# Patient Record
Sex: Female | Born: 1940 | Race: Black or African American | Hispanic: No | Smoking: Former smoker
Health system: Southern US, Community
[De-identification: ages and names within clinical notes are randomized; demographics above are authoritative.]

## PROBLEM LIST (undated history)

## (undated) DIAGNOSIS — G4730 Sleep apnea, unspecified: Secondary | ICD-10-CM

## (undated) DIAGNOSIS — I509 Heart failure, unspecified: Secondary | ICD-10-CM

## (undated) DIAGNOSIS — E119 Type 2 diabetes mellitus without complications: Secondary | ICD-10-CM

## (undated) DIAGNOSIS — N189 Chronic kidney disease, unspecified: Secondary | ICD-10-CM

## (undated) DIAGNOSIS — I10 Essential (primary) hypertension: Secondary | ICD-10-CM

## (undated) DIAGNOSIS — M1990 Unspecified osteoarthritis, unspecified site: Secondary | ICD-10-CM

## (undated) DIAGNOSIS — R0602 Shortness of breath: Secondary | ICD-10-CM

## (undated) DIAGNOSIS — M109 Gout, unspecified: Secondary | ICD-10-CM

## (undated) DIAGNOSIS — I639 Cerebral infarction, unspecified: Secondary | ICD-10-CM

## (undated) DIAGNOSIS — E785 Hyperlipidemia, unspecified: Secondary | ICD-10-CM

## (undated) DIAGNOSIS — J45909 Unspecified asthma, uncomplicated: Secondary | ICD-10-CM

## (undated) DIAGNOSIS — J449 Chronic obstructive pulmonary disease, unspecified: Secondary | ICD-10-CM

## (undated) HISTORY — DX: Essential (primary) hypertension: I10

## (undated) HISTORY — DX: Hyperlipidemia, unspecified: E78.5

## (undated) HISTORY — PX: EYE SURGERY: SHX253

## (undated) HISTORY — DX: Type 2 diabetes mellitus without complications: E11.9

## (undated) HISTORY — DX: Chronic obstructive pulmonary disease, unspecified: J44.9

## (undated) HISTORY — PX: TUBAL LIGATION: SHX77

## (undated) HISTORY — DX: Sleep apnea, unspecified: G47.30

---

## 2004-02-23 ENCOUNTER — Emergency Department (HOSPITAL_COMMUNITY): Admission: EM | Admit: 2004-02-23 | Discharge: 2004-02-23 | Payer: Self-pay | Admitting: Emergency Medicine

## 2007-06-27 ENCOUNTER — Encounter: Admission: RE | Admit: 2007-06-27 | Discharge: 2007-06-27 | Payer: Self-pay | Admitting: Internal Medicine

## 2009-02-04 ENCOUNTER — Encounter: Admission: RE | Admit: 2009-02-04 | Discharge: 2009-02-04 | Payer: Self-pay | Admitting: Internal Medicine

## 2009-07-11 ENCOUNTER — Encounter: Admission: RE | Admit: 2009-07-11 | Discharge: 2009-07-11 | Payer: Self-pay | Admitting: Internal Medicine

## 2009-08-09 ENCOUNTER — Ambulatory Visit: Payer: Self-pay

## 2009-08-09 ENCOUNTER — Encounter (INDEPENDENT_AMBULATORY_CARE_PROVIDER_SITE_OTHER): Payer: Self-pay | Admitting: Internal Medicine

## 2009-08-09 ENCOUNTER — Encounter: Payer: Self-pay | Admitting: Internal Medicine

## 2010-10-06 ENCOUNTER — Emergency Department (HOSPITAL_COMMUNITY): Admission: EM | Admit: 2010-10-06 | Discharge: 2010-10-07 | Payer: Self-pay | Admitting: Emergency Medicine

## 2011-01-27 LAB — BASIC METABOLIC PANEL
GFR calc Af Amer: 44 mL/min — ABNORMAL LOW (ref >60)
GFR calc non Af Amer: 36 mL/min — ABNORMAL LOW (ref >60)
Potassium: 4.5 mEq/L (ref 3.5–5.1)
Sodium: 140 mEq/L (ref 135–145)

## 2011-01-27 LAB — CBC
HCT: 38.8 % (ref 36.0–46.0)
Hemoglobin: 13 g/dL (ref 12.0–15.0)
WBC: 7.7 10*3/uL (ref 4.0–10.5)

## 2011-01-27 LAB — DIFFERENTIAL
Basophils Absolute: 0 10*3/uL (ref 0.0–0.1)
Lymphocytes Relative: 19 % (ref 12–46)
Monocytes Absolute: 0.9 10*3/uL (ref 0.1–1.0)
Monocytes Relative: 12 % (ref 3–12)
Neutro Abs: 5.1 10*3/uL (ref 1.7–7.7)

## 2011-01-27 LAB — POCT CARDIAC MARKERS
CKMB, poc: 2.8 ng/mL (ref 1.0–8.0)
Myoglobin, poc: 186 ng/mL (ref 12–200)

## 2012-05-30 ENCOUNTER — Encounter: Payer: Self-pay | Admitting: Pulmonary Disease

## 2012-05-31 ENCOUNTER — Ambulatory Visit (INDEPENDENT_AMBULATORY_CARE_PROVIDER_SITE_OTHER): Payer: Medicare Other | Admitting: Internal Medicine

## 2012-05-31 ENCOUNTER — Ambulatory Visit (INDEPENDENT_AMBULATORY_CARE_PROVIDER_SITE_OTHER)
Admission: RE | Admit: 2012-05-31 | Discharge: 2012-05-31 | Disposition: A | Discharge status: Home / Self Care | Payer: Medicare Other | Source: Ambulatory Visit | Attending: Internal Medicine | Admitting: Internal Medicine

## 2012-05-31 ENCOUNTER — Encounter: Payer: Self-pay | Admitting: Internal Medicine

## 2012-05-31 VITALS — BP 162/82 | HR 108 | Temp 98.3°F | Ht 62.0 in | Wt 182.8 lb

## 2012-05-31 DIAGNOSIS — J449 Chronic obstructive pulmonary disease, unspecified: Secondary | ICD-10-CM | POA: Insufficient documentation

## 2012-05-31 NOTE — Patient Instructions (Addendum)
Work on inhaler technique:  relax and gently blow all the way out then take a nice smooth deep breath back in, triggering the inhaler at same time you start breathing in.  Hold for up to 5 seconds if you can.  Rinse and gargle with water when done   If your mouth or throat starts to bother you,   I suggest you time the inhaler to your dental care and after using the inhaler(s) brush teeth and tongue with a baking soda containing toothpaste and when you rinse this out, gargle with it first to see if this helps your mouth and throat.     Only use your albuterol as a rescue medication (proaire is Plan B,  Nebulizer is C) to be used if you can't catch your breath by resting or doing a relaxed purse lip breathing pattern. The less you use it, the better it will work when you need it.   Add pepcid 20 mg one every night at bedtime  GERD (REFLUX)  is an extremely common cause of respiratory symptoms, many times with no significant heartburn at all.    It can be treated with medication, but also with lifestyle changes including avoidance of late meals, excessive alcohol, smoking cessation, and avoid fatty foods, chocolate, peppermint, colas, red wine, and acidic juices such as orange juice.  NO MINT OR MENTHOL PRODUCTS SO NO COUGH DROPS  USE SUGARLESS CANDY INSTEAD (jolley ranchers or Stover's)  NO OIL BASED VITAMINS - use powdered substitutes.    Please remember to go to the  x-ray department downstairs for your tests - we will call you with the results when they are available.  Please schedule a follow up office visit in 4 weeks, sooner if needed with PFTs

## 2012-05-31 NOTE — Progress Notes (Signed)
  Subjective:    Patient ID: Beth [name], female    DOB: July 15, 1941   MRN: [id number]  HPI  89 yobf quit smoking around 2007 when noted onset of doe then gradually worse since quit with no gain in wt and referred 05/31/2012 for copd evaluation by Dr Karlton Lemon   05/31/2012 1st pulmonary eval cc progressive doe x 5years ? Some worse in spring and ? Summer with hot humid weather with subjective wheeze while on spiriva x 2 years then advair 7/13 and prednisone has helped to point where need neb less "only  3 x day" and puffer 3-4 times as well. Assoc with voice change/ hoarsenss, ear pain.   No unusual cough, purulent sputum or sinus/hb symptoms on present rx.   Sleeping ok without nocturnal  or early am exacerbation  of respiratory  c/o's or need for noct saba. Also denies any obvious fluctuation of symptoms with weather or environmental changes or other aggravating or alleviating factors except as outlined above   Review of Systems  Constitutional: Positive for fatigue. Negative for fever, chills, diaphoresis, activity change, appetite change and unexpected weight change.  HENT: Positive for ear pain, congestion and voice change. Negative for hearing loss, nosebleeds, sore throat, rhinorrhea, sneezing, mouth sores, trouble swallowing, neck pain, neck stiffness, dental problem, postnasal drip, sinus pressure and ear discharge.   Eyes: Negative for photophobia, discharge, itching and visual disturbance.  Respiratory: Positive for shortness of breath and wheezing. Negative for cough, choking and chest tightness.   Cardiovascular: Positive for leg swelling. Negative for chest pain and palpitations.  Gastrointestinal: Negative for nausea, vomiting, abdominal pain, constipation and blood in stool.  Genitourinary: Negative for dysuria, urgency, frequency, hematuria, decreased urine volume and difficulty urinating.  Musculoskeletal: Positive for gait problem. Negative for myalgias, back pain, joint swelling  and arthralgias.  Skin: Negative for rash.  Neurological: Negative for dizziness, tremors, syncope, weakness, light-headedness, numbness and headaches.  Hematological: Does not bruise/bleed easily.  Psychiatric/Behavioral: Positive for disturbed wake/sleep cycle. Negative for confusion and agitation. The patient is nervous/anxious.        Objective:   Physical Exam   HEENT mild turbinate edema.  Oropharynx no thrush or excess pnd or cobblestoning.  No JVD or cervical adenopathy. Mild accessory muscle hypertrophy. Trachea midline, nl thryroid. Chest was hyperinflated by percussion with diminished breath sounds and moderate increased exp time without wheeze. Hoover sign positive at mid inspiration. Regular rate and rhythm without murmur gallop or rub or increase P2 or edema.  Abd: no hsm, nl excursion. Ext warm without cyanosis or clubbing.      CXR  05/31/2012 :  Comparison: 10/06/2010  Findings: The heart is again mildly enlarged in size. Mild interstitial changes are again noted. No acute infiltrate is seen. No acute bony abnormality is noted.  IMPRESSION: Chronic changes without acute abnormality.         Assessment & Plan:

## 2012-06-01 NOTE — Progress Notes (Signed)
Quick Note:  Spoke with pt and notified of results per Dr. Wert. Pt verbalized understanding and denied any questions.  ______ 

## 2012-06-04 NOTE — Assessment & Plan Note (Signed)
Symptoms are markedly disproportionate to objective findings and not clear this is all a lung problem but pt does appear to have difficult airway management issues.   In this case Adherence is the biggest issue and starts with  inability to use HFA effectively and also  understand that SABA treats the symptoms but doesn't get to the underlying problem (inflammation).  I used  the analogy of putting steroid cream on a rash to help explain the meaning of topical therapy and the need to get the drug to the target tissue.    The proper method of use, as well as anticipated side effects, of a metered-dose inhaler are discussed and demonstrated to the patient. Improved effectiveness after extensive coaching during this visit to a level of approximately  75%  ? Acid reflux ? Anxiety also in ddx  See instructions for specific recommendations which were reviewed directly with the patient who was given a copy with highlighter outlining the key components.

## 2012-06-30 ENCOUNTER — Ambulatory Visit (INDEPENDENT_AMBULATORY_CARE_PROVIDER_SITE_OTHER): Payer: Medicare Other | Admitting: Internal Medicine

## 2012-06-30 ENCOUNTER — Encounter: Payer: Self-pay | Admitting: Internal Medicine

## 2012-06-30 VITALS — BP 130/80 | HR 78 | Temp 98.1°F | Ht 63.0 in | Wt 177.0 lb

## 2012-06-30 DIAGNOSIS — J449 Chronic obstructive pulmonary disease, unspecified: Secondary | ICD-10-CM

## 2012-06-30 LAB — PULMONARY FUNCTION TEST

## 2012-06-30 NOTE — Progress Notes (Signed)
  Subjective:    Patient ID: Beth [name], female    DOB: 08-28-1941   MRN: [id number]  HPI  60 yobf quit smoking around 2007 when noted onset of doe then gradually worse since quit with no gain in wt and referred 05/31/2012 for copd evaluation by Dr Karlton Lemon   05/31/2012 1st pulmonary eval cc progressive doe x 5years ? Some worse in spring and ? Summer with hot humid weather with subjective wheeze while on spiriva x 2 years then advair 7/13 and prednisone has helped to point where need neb less "only  3 x day" and puffer 3-4 times as well. Assoc with voice change/ hoarsenss, ear pain.   No unusual cough, purulent sputum or sinus/hb symptoms on present rx. rec Work on inhaler technique:   Only use your albuterol as a rescue medication (proaire is Plan B,  Nebulizer is C) Add pepcid 20 mg one every night at bedtime GERD diet office visit in 4 weeks, sooner if needed with PFTs  06/30/2012 f/u ov/Beth [name] cc much better sob, much less saba use less than once a day. Not limited from any desired activities by sob.   No unusual cough, purulent sputum or sinus/hb symptoms on present rx. No decline since finished last rx with prednisone     Sleeping ok without nocturnal  or early am exacerbation  of respiratory  c/o's or need for noct saba. Also denies any obvious fluctuation of symptoms with weather or environmental changes or other aggravating or alleviating factors except as outlined above           Objective:   Physical Exam Wt Readings from Last 3 Encounters:  06/30/12 177 lb (80.287 kg)  05/31/12 182 lb 12.8 oz (82.918 kg)     HEENT mild turbinate edema.  Oropharynx no thrush or excess pnd or cobblestoning.  No JVD or cervical adenopathy. Mild accessory muscle hypertrophy. Trachea midline, nl thryroid. Chest was hyperinflated by percussion with diminished breath sounds and moderate increased exp time without wheeze. Hoover sign positive at mid inspiration. Regular rate and rhythm without murmur  gallop or rub or increase P2 or edema.  Abd: no hsm, nl excursion. Ext warm without cyanosis or clubbing.      CXR  05/31/2012 :  Comparison: 10/06/2010  Findings: The heart is again mildly enlarged in size. Mild interstitial changes are again noted. No acute infiltrate is seen. No acute bony abnormality is noted.  IMPRESSION: Chronic changes without acute abnormality.         Assessment & Plan:

## 2012-06-30 NOTE — Assessment & Plan Note (Signed)
-  hfa 75% 05/31/2012    - PFT's 06/30/2012 FEV1  0.93 (49%) ratio 49 and DLCO 47 corrects to 87  GOLD III but no limiting symptoms or recent exacerbations or need for saba vs baseline so reasonable to continue ICU/LABA and Spiriva for now.    Each maintenance medication was reviewed in detail including most importantly the difference between maintenance and as needed and under what circumstances the prns are to be used.  Please see instructions for details which were reviewed in writing and the patient given a copy.

## 2012-06-30 NOTE — Progress Notes (Signed)
PFT done today. 

## 2012-06-30 NOTE — Patient Instructions (Addendum)
No change in your medications  Please schedule a follow up visit in 3 months but call sooner if needed

## 2012-10-05 ENCOUNTER — Ambulatory Visit (INDEPENDENT_AMBULATORY_CARE_PROVIDER_SITE_OTHER): Payer: Medicare Other | Admitting: Internal Medicine

## 2012-10-05 ENCOUNTER — Encounter: Payer: Self-pay | Admitting: Internal Medicine

## 2012-10-05 VITALS — BP 112/64 | HR 88 | Temp 97.4°F | Ht 62.0 in | Wt 182.0 lb

## 2012-10-05 DIAGNOSIS — J449 Chronic obstructive pulmonary disease, unspecified: Secondary | ICD-10-CM

## 2012-10-05 DIAGNOSIS — J4489 Other specified chronic obstructive pulmonary disease: Secondary | ICD-10-CM

## 2012-10-05 NOTE — Patient Instructions (Addendum)
Try off spiriva to see if you notice any difference but keep your rescue inhaler handy and restart the spiriva if needed  Please schedule a follow up visit in 3 months but call sooner if needed

## 2012-10-05 NOTE — Progress Notes (Signed)
  Subjective:    Patient ID: Beth [name], female    DOB: 11-04-1941   MRN: [id number]  HPI  5 yobf quit smoking around 2007 when noted onset of doe then gradually worse since quit with no gain in wt and referred 05/31/2012 for copd evaluation by Dr Karlton Lemon   05/31/2012 1st pulmonary eval cc progressive doe x 5years ? Some worse in spring and ? Summer with hot humid weather with subjective wheeze while on spiriva x 2 years then advair 7/13 and prednisone has helped to point where need neb less "only  3 x day" and puffer 3-4 times as well. Assoc with voice change/ hoarsenss, ear pain.   No unusual cough, purulent sputum or sinus/hb symptoms on present rx. rec Work on inhaler technique:   Only use your albuterol as a rescue medication (proaire is Plan B,  Nebulizer is C) Add pepcid 20 mg one every night at bedtime GERD diet office visit in 4 weeks, sooner if needed with PFTs  06/30/2012 f/u ov/Wert cc much better sob, much less saba use less than once a day. rec No change rx  10/05/2012 f/u ov/Wert cc no limit to activities due to sob, some hoarseness, min daytime saba use   Not limited from any desired activities by sob althogh relatively sedentary   No unusual cough, purulent sputum or sinus/hb symptoms on present rx. No decline since finished last rx with prednisone     Sleeping ok without nocturnal  or early am exacerbation  of respiratory  c/o's or need for noct saba. Also denies any obvious fluctuation of symptoms with weather or environmental changes or other aggravating or alleviating factors except as outlined above    ROS  The following are not active complaints unless bolded sore throat, dysphagia, dental problems, itching, sneezing,  nasal congestion or excess/ purulent secretions, ear ache,   fever, chills, sweats, unintended wt loss, pleuritic or exertional cp, hemoptysis,  orthopnea pnd or leg swelling, presyncope, palpitations, heartburn, abdominal pain, anorexia, nausea,  vomiting, diarrhea  or change in bowel or urinary habits, change in stools or urine, dysuria,hematuria,  rash, arthralgias, visual complaints, headache, numbness weakness or ataxia or problems with walking or coordination,  change in mood/affect or memory.             Objective:   Physical Exam Wt 182 10/05/2012  Wt Readings from Last 3 Encounters:  06/30/12 177 lb (80.287 kg)  05/31/12 182 lb 12.8 oz (82.918 kg)     HEENT mild turbinate edema.  Oropharynx no thrush or excess pnd or cobblestoning.  No JVD or cervical adenopathy. Mild accessory muscle hypertrophy. Trachea midline, nl thryroid. Chest was hyperinflated by percussion with diminished breath sounds and moderate increased exp time without wheeze. Hoover sign positive at mid inspiration. Regular rate and rhythm without murmur gallop or rub or increase P2 or edema.  Abd: no hsm, nl excursion. Ext warm without cyanosis or clubbing.      CXR  05/31/2012 :  Comparison: 10/06/2010  Findings: The heart is again mildly enlarged in size. Mild interstitial changes are again noted. No acute infiltrate is seen. No acute bony abnormality is noted.  IMPRESSION: Chronic changes without acute abnormality.         Assessment & Plan:

## 2012-10-06 NOTE — Assessment & Plan Note (Addendum)
-  hfa 75% 05/31/2012    - PFT's 06/30/2012 FEV1  0.93 (49%) ratio 49 and DLCO 47 corrects to 87  I had an extended discussion with the patient today lasting 15 to 20 minutes of a 25 minute visit on the following issues:   She has GOLD III copd but very well compensated on present rx with most of the improvement coming after advair added and having trouble paying for all her meds so reasonable to try off spriva emphasizing it may take up to  10 days to tell the difference with doe and saba use as good indicators of effectiveness or lack thereof for spiriva.    Each maintenance medication was reviewed in detail including most importantly the difference between maintenance and as needed and under what circumstances the prns are to be used.  Please see instructions for details which were reviewed in writing and the patient given a copy.

## 2012-12-26 ENCOUNTER — Ambulatory Visit (INDEPENDENT_AMBULATORY_CARE_PROVIDER_SITE_OTHER): Payer: Medicare Other | Admitting: Internal Medicine

## 2012-12-26 ENCOUNTER — Encounter: Payer: Self-pay | Admitting: Internal Medicine

## 2012-12-26 VITALS — BP 130/72 | HR 89 | Temp 97.0°F | Ht 62.0 in | Wt 178.0 lb

## 2012-12-26 DIAGNOSIS — J449 Chronic obstructive pulmonary disease, unspecified: Secondary | ICD-10-CM

## 2012-12-26 MED ORDER — BUDESONIDE-FORMOTEROL FUMARATE 160-4.5 MCG/ACT IN AERO: INHALATION_SPRAY | RESPIRATORY_TRACT | Status: DC

## 2012-12-26 NOTE — Patient Instructions (Addendum)
Plan A= automatic / take no matter what = Symbicort 160 Take 2 puffs first thing in am and then another 2 puffs about 12 hours later should cause less hoarseness but if not ok to resume advair as plan A     Plan B = proaire(albuterol)  only if needed if can't catch breath to supplement plan A  Plan C = nebulizer (albuterol) only if Plan B doesn't work  Plan D > call the doctor  Plan E > ER   Please schedule a follow up visit in 3 months but call sooner if needed

## 2012-12-26 NOTE — Progress Notes (Signed)
  Subjective:    Patient ID: Beth [name], female    DOB: [birth year]   MRN: [id number]  HPI  79 yobf quit smoking around 2007 when noted onset of doe then gradually worse since quit with no gain in wt and referred 05/31/2012 for copd evaluation by Dr Karlton Lemon   05/31/2012 1st pulmonary eval cc progressive doe x 5years ? Some worse in spring and ? Summer with hot humid weather with subjective wheeze while on spiriva x 2 years then advair 7/13 and prednisone has helped to point where need neb less "only  3 x day" and puffer 3-4 times as well. Assoc with voice change/ hoarsenss, ear pain.   No unusual cough, purulent sputum or sinus/hb symptoms on present rx. rec Work on inhaler technique:   Only use your albuterol as a rescue medication (proaire is Plan B,  Nebulizer is C) Add pepcid 20 mg one every night at bedtime GERD diet office visit in 4 weeks, sooner if needed with PFTs  06/30/2012 f/u ov/Karsen Fellows cc much better sob, much less saba use less than once a day. rec No change rx  10/05/2012 f/u ov/Kailea Dannemiller cc no limit to activities due to sob, some hoarseness, min daytime saba use rec Try off spiriva to see if you notice any difference but keep your rescue inhaler handy and restart the spiriva if needed   12/26/2012 f/u ov/Noheli Melder cc no limiting sob doing ok off spiriva and on advair alone but variable hoarseness.  Min daytime saba     No unusual cough, purulent sputum or sinus/hb symptoms on present rx. No decline since finished last rx with prednisone     Sleeping ok without nocturnal  or early am exacerbation  of respiratory  c/o's or need for noct saba. Also denies any obvious fluctuation of symptoms with weather or environmental changes or other aggravating or alleviating factors except as outlined above    ROS  The following are not active complaints unless bolded sore throat, dysphagia, dental problems, itching, sneezing,  nasal congestion or excess/ purulent secretions, ear ache,   fever,  chills, sweats, unintended wt loss, pleuritic or exertional cp, hemoptysis,  orthopnea pnd or leg swelling, presyncope, palpitations, heartburn, abdominal pain, anorexia, nausea, vomiting, diarrhea  or change in bowel or urinary habits, change in stools or urine, dysuria,hematuria,  rash, arthralgias, visual complaints, headache, numbness weakness or ataxia or problems with walking or coordination,  change in mood/affect or memory.             Objective:   Physical Exam Wt 182 10/05/2012 > 12/26/2012 178   06/30/12 177 lb (80.287 kg)  05/31/12 182 lb 12.8 oz (82.918 kg)     HEENT mild turbinate edema.  Oropharynx no thrush or excess pnd or cobblestoning.  No JVD or cervical adenopathy. Mild accessory muscle hypertrophy. Trachea midline, nl thryroid. Chest was hyperinflated by percussion with diminished breath sounds and moderate increased exp time without wheeze. Hoover sign positive at mid inspiration. Regular rate and rhythm without murmur gallop or rub or increase P2 or edema.  Abd: no hsm, nl excursion. Ext warm without cyanosis or clubbing.      CXR  05/31/2012 :  Comparison: 10/06/2010  Findings: The heart is again mildly enlarged in size. Mild interstitial changes are again noted. No acute infiltrate is seen. No acute bony abnormality is noted.  IMPRESSION: Chronic changes without acute abnormality.         Assessment & Plan:

## 2012-12-27 NOTE — Assessment & Plan Note (Signed)
-  hfa 75% 12/26/12   - PFT's 06/30/2012 FEV1  0.93 (49%) ratio 49 and DLCO 47 corrects to 87  Variable hoarseness s sob/ cough may be related to advair effects on upper airway  The proper method of use, as well as anticipated side effects, of a metered-dose inhaler are discussed and demonstrated to the patient. Improved effectiveness after extensive coaching during this visit to a level of approximately  75% so worth try change to symbicort 160 2 bid with option  Also for budesonide/perforomist also.    Each maintenance medication was reviewed in detail including most importantly the difference between maintenance and as needed and under what circumstances the prns are to be used.  Please see instructions for details which were reviewed in writing and the patient given a copy.

## 2013-03-24 ENCOUNTER — Other Ambulatory Visit: Payer: Self-pay | Admitting: Ophthalmology

## 2013-03-24 NOTE — H&P (Signed)
Patient Record  S[facility]ENT, [place]  Patient Number:  [id number] Date of Birth:  [birth year] Age:  72 years old    Gender:  Female Date of Evaluation:  Mar 24, 2013  Chief Complaint:   72 year old female is referred for Cataract evaluation. Her left eye has had poor vision x 5 years. She cannot read well, has to sit close to TV to see it, cannot recognize faces unless they are close.  History of Present Illness:   72 yo female diabetic referred by Dr Karlton Lemon for diabetic eye exam.  She has had DM x 8 years. Her HgB A1C was over 7 on her last visit.  C/O blurred v ision.  No pain or discomfort.  No pus or mucus.  Presents for evaluationl( Reviewed by Doctor: GG) Past History:  Allergies:  nkda, Active Medications:   Other Medications:  Advair Diskus (fluticasone-salmeterol) disk with device 250-50 mcg/dose, simvastatin (simvastatin) tablet 20 mg, Tribenzor (olmesartan-amlodipin-hcthiazid) tablet 40-10-25 mg, Janumet (sitagliptin-metformin) tablet 50-1,000 mg, ProAir HFA (albuterol sulfate) HFA aerosol inhaler 90 mcg/actuation, albuterol sulfate (albuterol sulfate) solution for nebulization 0.63 mg/3 mL Birth History:  none Past Ocular History:  none Past Medical History:   diabetes  COPD  high blood pressure Past Surgical History:  none Family History:  no amblyopia, no blindness, no cataracts, no crossed eyes, no diabetic retinopathy, no glaucoma, no macular degeneration, no retinal detachment, no cancer, + diabetes (daughter), + heart disease (brother), + high blood pressure (mother, brother, father), + stroke (mother) Social History:   Smoking Status: former smoker  Alcohol:  none   Driving status:  not driving Marital status:  widowed Review of Systems:   Eyes: + decreased vision  All other systems are negative.  Examination:  Visual Acuity:   Distance [facility] [place]:  OD: 20/400    OS: 20/200 IOP:  OD:  14     OS:  14    @ 06:13PM (Goldmann applanation) Manifest Refraction:    Sphere     Cyl Axis       VA         Add       VA Prism Base R:  -4.00  -1.50  095    20/50                                  L:  -1.75                20/50                                    Confrontation visual field:  OU:  Normal  Motility:  OU:  Normal  Pupils:  OU:  Shape, size, direct and consensual reaction normal, No afferent defect  Adnexa:  Preauricular LN, lacrimal drainage, lacrimal glands, orbit normal  Eyelids:  Eyelids:  normal Conjunctiva: OU:  bulbar, palpebral normal  Cornea: OU:  epithelium, stroma, endothelium, tear film normal  Anterior Chamber: OU:  depth normal, no cell, no flare, 3+ deep / clear  Iris: OU:  normal, rubeosis absent Dilation:  OU: Tropicamide 1%, AK-Dilate @ 08:44AM Cycloplegic Refraction:    Sphere    Cyl Axis       VA   Prism Base R:  -4.00  -1.50   95    20/50  L:  -1.75                20/50     AScan Calulations: +11.00 Acrysof MA50BM PC IOL for emmetropia             Lens:  OD:  3+ nuclear sclerotic, 2+ cortical cataract OS:  3+ nuclear sclerotic, 2+ cortical cataract  Vitreous: OU:  normal  Optic Disc: OD:  cupping: 0.15   OS:  cupping: 0.15   Macula: OU:  normal  Vessels: OU:  normal  Periphery: OU:  normal   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 We discussed risks and benefits of surgery with illustrations.  Use of an implant lens was discussed with illustrations along with indicating that glasses will be needed post op for help with reading and making up any difference in distance power.  I have confirmed that her Cataracts are consistent with her reduced best corrected acuity of 20/50 OU. She desires to proceed with surgery on her right eye which is the worst eye  perceptually.  She indicated understanding our discussion and felt that her questions had been answered to her satisfaction.  She agrees with the treatment plan.  Patient Instructions: Do not eat or drink after midnight. Do not take any diabetic medication the morning of surgery. You may take your other medications with a small sip of water. Send letter to:  Willey Blade, MD, [facility], [address], [place], [place] [address] Return to clinic:  RV June 5th, 2014 for post-operative follow-up  Schedule:  Phacoemulsification, Posterior Chamber Intraocular Lens x 04/19/2013   (electronically signed)  Adonis Brook, MD

## 2013-03-24 NOTE — H&P (Signed)
Beth [name], [place]  Patient Number:  [id number] Date of Birth:  [birth year] Age:  72 years old    Gender:  Female Date of Evaluation:  Mar 24, 2013  Chief Complaint:   72 year old female is referred for Cataract evaluation. Her left eye has had poor vision x 5 years. She cannot read well, has to sit close to TV to see it, cannot recognize faces unless they are close.  History of Present Illness:   72 yo female diabetic referred by Dr Karlton Lemon for diabetic eye exam.  She has had DM x 8 years. Her HgB A1C was over 7 on her last visit.  C/O blurred v ision.  No pain or discomfort.  No pus or mucus.  Presents for evaluationl( Reviewed by Doctor: GG) Past History:  Allergies:  nkda, Active Medications:   Other Medications:  Advair Diskus (fluticasone-salmeterol) disk with device 250-50 mcg/dose, simvastatin (simvastatin) tablet 20 mg, Tribenzor (olmesartan-amlodipin-hcthiazid) tablet 40-10-25 mg, Janumet (sitagliptin-metformin) tablet 50-1,000 mg, ProAir HFA (albuterol sulfate) HFA aerosol inhaler 90 mcg/actuation, albuterol sulfate (albuterol sulfate) solution for nebulization 0.63 mg/3 mL Birth History:  none Past Ocular History:  none Past Medical History:   diabetes  COPD  high blood pressure Past Surgical History:  none Family History:  no amblyopia, no blindness, no cataracts, no crossed eyes, no diabetic retinopathy, no glaucoma, no macular degeneration, no retinal detachment, no cancer, + diabetes (daughter), + heart disease (brother), + high blood pressure (mother, brother, father), + stroke (mother) Social History:   Smoking Status: former smoker  Alcohol:  none   Driving status:  not driving Marital status:  widowed Review of Systems:   Eyes: + decreased vision  All other systems are negative.  Examination:  Visual Acuity:   Distance VA [place]:  OD: 20/400    OS: 20/200 IOP:  OD:  14     OS:  14    @ 06:13PM (Goldmann applanation) Manifest Refraction:    Sphere    Cyl Axis        VA         Add       VA Prism Base R:  -4.00  -1.50  095    20/50                                  L:  -1.75                20/50                                    Confrontation visual field:  OU:  Normal  Motility:  OU:  Normal  Pupils:  OU:  Shape, size, direct and consensual reaction normal, No afferent defect  Adnexa:  Preauricular LN, lacrimal drainage, lacrimal glands, orbit normal  Eyelids:  Eyelids:  normal Conjunctiva: OU:  bulbar, palpebral normal  Cornea: OU:  epithelium, stroma, endothelium, tear film normal  Anterior Chamber: OU:  depth normal, no cell, no flare, 3+ deep / clear  Iris: OU:  normal, rubeosis absent Dilation:  OU: Tropicamide 1%, AK-Dilate @ 08:44AM Cycloplegic Refraction:    Sphere    Cyl Axis       VA   Prism Base R:  -4.00  -1.50   95    20/50  L:  -1.75                20/50     AScan Calulations: +11.00 Acrysof MA50BM PC IOL for emmetropia             Lens:  OD:  3+ nuclear sclerotic, 2+ cortical cataract OS:  3+ nuclear sclerotic, 2+ cortical cataract  Vitreous: OU:  normal  Optic Disc: OD:  cupping: 0.15   OS:  cupping: 0.15   Macula: OU:  normal  Vessels: OU:  normal  Periphery: OU:  normal   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 We discussed risks and benefits of surgery with illustrations.  Use of an implant lens was discussed with illustrations along with indicating that glasses will be needed post op for help with reading and making up any difference in distance power.  I have confirmed that her Cataracts are consistent with her reduced best corrected acuity of 20/50 OU. She desires to proceed with surgery on her right eye which is the worst eye perceptually.  She  indicated understanding our discussion and felt that her questions had been answered to her satisfaction.  She agrees with the treatment plan. She desires to have Cataract surgery right eye.  Patient Instructions: Do not eat or drink after midnight. Do not take any diabetic medication the morning of surgery. You may take your other medications with a small sip of water. Send letter to:  Willey Blade, MD, [facility], [address], [address], [place], [place] [address] Return to clinic:  RV June 5th, 2014 for post-operative follow-up  Schedule:  Phacoemulsification, Posterior Chamber Intraocular Lens x 04/19/2013   (electronically signed)  Adonis Brook, MD

## 2013-03-28 ENCOUNTER — Ambulatory Visit (INDEPENDENT_AMBULATORY_CARE_PROVIDER_SITE_OTHER): Payer: Medicare Other | Admitting: Internal Medicine

## 2013-03-28 ENCOUNTER — Encounter: Payer: Self-pay | Admitting: Internal Medicine

## 2013-03-28 VITALS — BP 140/80 | HR 86 | Temp 97.5°F | Ht 62.0 in | Wt 178.0 lb

## 2013-03-28 DIAGNOSIS — J449 Chronic obstructive pulmonary disease, unspecified: Secondary | ICD-10-CM

## 2013-03-28 NOTE — Patient Instructions (Addendum)
Try symbicort 160 one twice daily   Only use your albuterol as a rescue medication to be used if you can't catch your breath by resting or doing a relaxed purse lip breathing pattern. The less you use it, the better it will work when you need it. Ok to use it up to every 4 hours but goal is less than twice a week  If you are limited from doing what you want and the albuterol doesn't help you do it and you can't tolerate twice daily symbicort at two puffs dose then add back spiriva daily but note it takes about 10 days to feel the spiriva working to improve your activity tolerance   If you are satisfied with your treatment plan let your doctor know and he/she can either refill your medications or you can return here when your prescription runs out.     If in any way you are not 100% satisfied,  please tell us.  If 100% better, tell your friends!

## 2013-03-28 NOTE — Progress Notes (Signed)
Subjective:    Patient ID: Beth [name], female    DOB: [birth year]   MRN: [id number]    Brief patient profile:  72 yobf quit smoking around 2007 when noted onset of doe then gradually worse since quit with no gain in wt and referred 05/31/2012 for copd evaluation by Dr Karlton Lemon with GOLD III COPD by PFT's 06/2012  HPI 05/31/2012 1st pulmonary eval cc progressive doe x 5years ? Some worse in spring and ? Summer with hot humid weather with subjective wheeze while on spiriva x 2 years then advair 7/13 and prednisone has helped to point where need neb less "only  3 x day" and puffer 3-4 times as well. Assoc with voice change/ hoarsenss, ear pain.   No unusual cough, purulent sputum or sinus/hb symptoms on present rx. rec Work on inhaler technique:   Only use your albuterol as a rescue medication (proaire is Plan B,  Nebulizer is C) Add pepcid 20 mg one every night at bedtime GERD diet office visit in 4 weeks, sooner if needed with PFTs    10/05/2012 f/u ov/Beth [name] cc no limit to activities due to sob, some hoarseness, min daytime saba use rec Try off spiriva to see if you notice any difference but keep your rescue inhaler handy and restart the spiriva if needed   12/26/2012 f/u ov/Beth [name] cc no limiting sob doing ok off spiriva and on advair alone but variable hoarseness.  Min daytime saba rec Plan A= automatic / take no matter what = Symbicort 160 Take 2 puffs first thing in am and then another 2 puffs about 12 hours later should cause less hoarseness but if not ok to resume advair as plan A  Plan B = proaire(albuterol)  only if needed if can't catch breath to supplement plan A Plan C = nebulizer (albuterol) only if Plan B doesn't work  03/28/2013 f/u ov/Beth [name]  Chief Complaint  Patient presents with  . Follow-up    Breathing is unchanged since the last visit. She feels like symbicort makes her jittery.     Much less need for proaire but still uses it around noon, hoarsness gone but now jittery  since chaned advair to symbicort.  Not using neb alb at all now.   No obvious daytime variabilty or assoc chronic cough or cp or chest tightness, subjective wheeze overt sinus or hb symptoms. No unusual exp hx or h/o childhood pna/ asthma or premature birth to her knowledge.    Sleeping ok without nocturnal  or early am exacerbation  of respiratory  c/o's or need for noct saba. Also denies any obvious fluctuation of symptoms with weather or environmental changes or other aggravating or alleviating factors except as outlined above    Current Medications, Allergies, Past Medical History, Past Surgical History, Family History, and Social History were reviewed in [employer] record.  ROS  The following are not active complaints unless bolded sore throat, dysphagia, dental problems, itching, sneezing,  nasal congestion or excess/ purulent secretions, ear ache,   fever, chills, sweats, unintended wt loss, pleuritic or exertional cp, hemoptysis,  orthopnea pnd or leg swelling, presyncope, palpitations, heartburn, abdominal pain, anorexia, nausea, vomiting, diarrhea  or change in bowel or urinary habits, change in stools or urine, dysuria,hematuria,  rash, arthralgias, visual complaints, headache, numbness weakness or ataxia or problems with walking or coordination,  change in mood/affect or memory.                 Objective:  Physical Exam  Wt 182 10/05/2012 > 12/26/2012 178 > 178 03/28/2013    06/30/12 177 lb (80.287 kg)  05/31/12 182 lb 12.8 oz (82.918 kg)     HEENT mild turbinate edema.  Oropharynx no thrush or excess pnd or cobblestoning.  No JVD or cervical adenopathy. Mild accessory muscle hypertrophy. Trachea midline, nl thryroid. Chest was hyperinflated by percussion with diminished breath sounds and moderate increased exp time without wheeze. Hoover sign positive at mid inspiration. Regular rate and rhythm without murmur gallop or rub or increase P2 or edema.  Abd:  no hsm, nl excursion. Ext warm without cyanosis or clubbing.      CXR  05/31/2012 :  Comparison: 10/06/2010  Findings: The heart is again mildly enlarged in size. Mild interstitial changes are again noted. No acute infiltrate is seen. No acute bony abnormality is noted.  IMPRESSION: Chronic changes without acute abnormality.         Assessment & Plan:

## 2013-03-28 NOTE — Assessment & Plan Note (Addendum)
  -   PFT's 06/30/2012 FEV1  0.93 (49%) ratio 49 and DLCO 47 corrects to 87   The proper method of use, as well as anticipated side effects, of a metered-dose inhaler are discussed and demonstrated to the patient. Improved effectiveness after extensive coaching during this visit to a level of approximately  90% from baseline of 50% so may well be she can still take symbicort 160 1 bid and get same benefit s the jitters if not add back spiriva.    Each maintenance medication was reviewed in detail including most importantly the difference between maintenance and as needed and under what circumstances the prns are to be used.  Please see instructions for details which were reviewed in writing and the patient given a copy.

## 2013-04-12 NOTE — Pre-Procedure Instructions (Signed)
Layloni Greif  04/12/2013   Your procedure is scheduled on:  Wednesday, June 4th   Report to [facility] [place] to main entrance "A" go to [employer] and go up to 3rd floor, check in at short stay desk.   Call this number if you have problems the morning of surgery: [phone]   Remember:   Do not eat food or drink liquids after midnight.   Take these medicines the morning of surgery with A SIP OF WATER: Diltiazem, Symbicort, Albuterol if needed   Do not wear jewelry, make-up or nail polish.  Do not wear lotions, powders, or perfumes, deodorant.  Do not shave 48 hours prior to surgery.   Do not bring valuables to the hospital.  Contacts, dentures or bridgework may not be worn into surgery.  Leave suitcase in the car. After surgery it may be brought to your room.  For patients admitted to the hospital, checkout time is 11:00 AM the day of  discharge.   Patients discharged the day of surgery will not be allowed to drive home.    Special Instructions: Shower using CHG 2 nights before surgery and the night before surgery.  If you shower the day of surgery use CHG.  Use special wash - you have one bottle of CHG for all showers.  You should use approximately 1/3 of the bottle for each shower.   Please read over the following fact sheets that you were given: Pain Booklet, Coughing and Deep Breathing and Surgical Site Infection Prevention

## 2013-04-13 ENCOUNTER — Encounter (HOSPITAL_COMMUNITY)
Admission: RE | Admit: 2013-04-13 | Discharge: 2013-04-13 | Disposition: A | Discharge status: Home / Self Care | Payer: Medicare Other | Source: Ambulatory Visit | Attending: Ophthalmology | Admitting: Ophthalmology

## 2013-04-13 ENCOUNTER — Encounter (HOSPITAL_COMMUNITY): Payer: Self-pay

## 2013-04-13 ENCOUNTER — Encounter (HOSPITAL_COMMUNITY): Payer: Self-pay | Admitting: Pharmacy Technician

## 2013-04-13 HISTORY — DX: Shortness of breath: R06.02

## 2013-04-13 HISTORY — DX: Unspecified asthma, uncomplicated: J45.909

## 2013-04-13 LAB — BASIC METABOLIC PANEL
BUN: 24 mg/dL — ABNORMAL HIGH (ref 6–23)
CO2: 27 mEq/L (ref 19–32)
Calcium: 9.9 mg/dL (ref 8.4–10.5)
Creatinine, Ser: 0.79 mg/dL (ref 0.50–1.10)

## 2013-04-13 LAB — CBC
MCH: 30.2 pg (ref 26.0–34.0)
MCV: 90.5 fL (ref 78.0–100.0)
Platelets: 266 10*3/uL (ref 150–400)
RBC: 4.74 MIL/uL (ref 3.87–5.11)

## 2013-04-13 NOTE — Progress Notes (Signed)
WILL REQUEST SLEEP STUDY FROM DR. SHELTON'S OFFICE.

## 2013-04-13 NOTE — Progress Notes (Signed)
NOTIFIED DR. Nida Boatman OFFICE OF CONSENT ORDER HAVING BOTH LEFT + RIGHT EYE, NEED CORRECTED AS TO WHICH EYE SURGERY TO BE .

## 2013-04-14 NOTE — Progress Notes (Addendum)
Friday....Marland KitchenMarland KitchenI spoke with Dr. Anderson Malta regarding the order and she confirmed the order (which is in epic) for the LEFT eye   Confirmed laterality of procedure based on H & P.Marland KitchenMarland KitchenDA Left a message for "Natasha" @ Dr. Roland Earl office for comparison EKG........(Friday 11:30AM)

## 2013-04-14 NOTE — Progress Notes (Signed)
Call to Dr. Dr. Roland Earl office, requested on voicemail to med. Records, to call back to [facility] or fax sleep study report ASAP.

## 2013-04-17 NOTE — Progress Notes (Signed)
Anesthesia chart review: Patient is a 72 year old female scheduled for phacoemulsification posterior chamber lens, left eye on 04/19/2013 by Dr. Anderson Malta. History includes obesity, diabetes mellitus type 2, former smoker, COPD Gold III, obstructive sleep apnea with use of CPAP, asthma, hypertension, hyperlipidemia.  PCP is Dr. Willey Blade.  Pulmonologist is Dr. Melvyn Novas, last visit 03/28/13 for routine follow-up.  CXR report on 05/31/12 showed the heart is again mildly enlarged in size. Mild interstitial changes are again noted. No acute infiltrate is seen. No acute bony abnormality is noted.  PFT's 06/30/2012 FEV1 0.93 (49%) ratio 49 and DLCO 47 corrects to 87.  EKG on 04/13/13 showed NSR, right BBB.  Echo on 08/09/09 showed: 1. Left ventricle: Systolic function was vigorous. The estimated ejection fraction was in the range of 65% to 70%. Doppler parameters are consistent with abnormal left ventricular relaxation (grade 1 diastolic dysfunction). 2. Aortic valve: AV is thickened, calcified without significantly restricted motion. 3. Trivial mitral regurgitation.  Preoperative labs noted.  If no acute changes then I would anticipate that she could proceed as planned.  She will be evaluated by her assigned anesthesiologist on the day of surgery.  [facility] Short Stay Center/Anesthesiology Phone [phone] 04/17/2013 12:07 PM

## 2013-04-18 MED ORDER — GATIFLOXACIN 0.5 % OP SOLN
1.0000 [drp] | OPHTHALMIC | Status: AC | PRN
  Administered 2013-04-19 (×3): 1 [drp] via OPHTHALMIC
  Filled 2013-04-18: qty 2.5

## 2013-04-18 MED ORDER — PREDNISOLONE ACETATE 1 % OP SUSP
1.0000 [drp] | OPHTHALMIC | Status: AC
  Administered 2013-04-19: 1 [drp] via OPHTHALMIC
  Filled 2013-04-18: qty 5

## 2013-04-18 MED ORDER — TETRACAINE HCL 0.5 % OP SOLN
2.0000 [drp] | OPHTHALMIC | Status: AC
  Administered 2013-04-19: 2 [drp] via OPHTHALMIC
  Filled 2013-04-18: qty 2

## 2013-04-18 MED ORDER — PHENYLEPHRINE HCL 2.5 % OP SOLN
1.0000 [drp] | OPHTHALMIC | Status: AC | PRN
  Administered 2013-04-19 (×3): 1 [drp] via OPHTHALMIC
  Filled 2013-04-18: qty 3

## 2013-04-19 ENCOUNTER — Encounter (HOSPITAL_COMMUNITY)
Admission: RE | Disposition: A | Discharge status: Home / Self Care | Payer: Self-pay | Source: Ambulatory Visit | Attending: Ophthalmology

## 2013-04-19 ENCOUNTER — Ambulatory Visit (HOSPITAL_COMMUNITY)
Admission: RE | Admit: 2013-04-19 | Discharge: 2013-04-19 | Disposition: A | Discharge status: Home / Self Care | Payer: Medicare Other | Source: Ambulatory Visit | Attending: Ophthalmology | Admitting: Ophthalmology

## 2013-04-19 ENCOUNTER — Encounter (HOSPITAL_COMMUNITY): Payer: Self-pay | Admitting: Vascular Surgery

## 2013-04-19 ENCOUNTER — Ambulatory Visit (HOSPITAL_COMMUNITY): Payer: Medicare Other | Admitting: Anesthesiology

## 2013-04-19 ENCOUNTER — Encounter (HOSPITAL_COMMUNITY): Payer: Self-pay | Admitting: *Deleted

## 2013-04-19 DIAGNOSIS — H25019 Cortical age-related cataract, unspecified eye: Secondary | ICD-10-CM | POA: Insufficient documentation

## 2013-04-19 DIAGNOSIS — J449 Chronic obstructive pulmonary disease, unspecified: Secondary | ICD-10-CM | POA: Insufficient documentation

## 2013-04-19 DIAGNOSIS — IMO0002 Reserved for concepts with insufficient information to code with codable children: Secondary | ICD-10-CM | POA: Insufficient documentation

## 2013-04-19 DIAGNOSIS — I10 Essential (primary) hypertension: Secondary | ICD-10-CM | POA: Insufficient documentation

## 2013-04-19 DIAGNOSIS — E1139 Type 2 diabetes mellitus with other diabetic ophthalmic complication: Secondary | ICD-10-CM | POA: Insufficient documentation

## 2013-04-19 DIAGNOSIS — Z79899 Other long term (current) drug therapy: Secondary | ICD-10-CM | POA: Insufficient documentation

## 2013-04-19 DIAGNOSIS — J4489 Other specified chronic obstructive pulmonary disease: Secondary | ICD-10-CM | POA: Insufficient documentation

## 2013-04-19 DIAGNOSIS — H2510 Age-related nuclear cataract, unspecified eye: Secondary | ICD-10-CM | POA: Insufficient documentation

## 2013-04-19 HISTORY — PX: CATARACT EXTRACTION W/PHACO: SHX586

## 2013-04-19 LAB — GLUCOSE, CAPILLARY
Glucose-Capillary: 86 mg/dL (ref 70–99)
Glucose-Capillary: 119 mg/dL — ABNORMAL HIGH (ref 70–99)

## 2013-04-19 SURGERY — PHACOEMULSIFICATION, CATARACT, WITH IOL INSERTION
Anesthesia: General | Site: Eye | Laterality: Right | Wound class: Clean

## 2013-04-19 MED ORDER — DEXAMETHASONE SODIUM PHOSPHATE 10 MG/ML IJ SOLN
INTRAMUSCULAR | Status: AC
  Filled 2013-04-19: qty 1

## 2013-04-19 MED ORDER — HYDROMORPHONE HCL PF 1 MG/ML IJ SOLN: 0.2500 mg | INTRAMUSCULAR | Status: DC | PRN

## 2013-04-19 MED ORDER — BACITRACIN-POLYMYXIN B 500-10000 UNIT/GM OP OINT
TOPICAL_OINTMENT | OPHTHALMIC | Status: AC
  Filled 2013-04-19: qty 3.5

## 2013-04-19 MED ORDER — BSS IO SOLN
INTRAOCULAR | Status: AC
  Filled 2013-04-19: qty 500

## 2013-04-19 MED ORDER — CEFAZOLIN SUBCONJUNCTIVAL INJECTION 100 MG/0.5 ML
200.0000 mg | INJECTION | SUBCONJUNCTIVAL | Status: DC
  Filled 2013-04-19: qty 1

## 2013-04-19 MED ORDER — TETRACAINE HCL 0.5 % OP SOLN
OPHTHALMIC | Status: AC
  Filled 2013-04-19: qty 2

## 2013-04-19 MED ORDER — SODIUM CHLORIDE 0.9 % IV SOLN
INTRAVENOUS | Status: DC
  Administered 2013-04-19 (×2): via INTRAVENOUS

## 2013-04-19 MED ORDER — GLYCOPYRROLATE 0.2 MG/ML IJ SOLN
INTRAMUSCULAR | Status: DC | PRN
  Administered 2013-04-19: .6 mg via INTRAVENOUS

## 2013-04-19 MED ORDER — ONDANSETRON HCL 4 MG/2ML IJ SOLN: 4.0000 mg | Freq: Once | INTRAMUSCULAR | Status: DC | PRN

## 2013-04-19 MED ORDER — ACETAZOLAMIDE SODIUM 500 MG IJ SOLR
INTRAMUSCULAR | Status: AC
  Filled 2013-04-19: qty 500

## 2013-04-19 MED ORDER — ACETAMINOPHEN 10 MG/ML IV SOLN: 1000.0000 mg | Freq: Once | INTRAVENOUS | Status: DC | PRN

## 2013-04-19 MED ORDER — TRIAMCINOLONE ACETONIDE 40 MG/ML IJ SUSP
INTRAMUSCULAR | Status: AC
  Filled 2013-04-19: qty 5

## 2013-04-19 MED ORDER — SODIUM CHLORIDE 0.9 % IV SOLN: INTRAVENOUS | Status: DC

## 2013-04-19 MED ORDER — NA CHONDROIT SULF-NA HYALURON 40-30 MG/ML IO SOLN
INTRAOCULAR | Status: DC | PRN
  Administered 2013-04-19: 0.5 mL via INTRAOCULAR

## 2013-04-19 MED ORDER — 0.9 % SODIUM CHLORIDE (POUR BTL) OPTIME
TOPICAL | Status: DC | PRN
  Administered 2013-04-19: 1000 mL

## 2013-04-19 MED ORDER — HYPROMELLOSE (GONIOSCOPIC) 2.5 % OP SOLN
OPHTHALMIC | Status: DC | PRN
  Administered 2013-04-19: 2 [drp] via OPHTHALMIC

## 2013-04-19 MED ORDER — EPINEPHRINE HCL 1 MG/ML IJ SOLN
INTRAMUSCULAR | Status: AC
  Filled 2013-04-19: qty 1

## 2013-04-19 MED ORDER — EPINEPHRINE HCL 1 MG/ML IJ SOLN
INTRAMUSCULAR | Status: DC | PRN
  Administered 2013-04-19: 12:00:00

## 2013-04-19 MED ORDER — NEOSTIGMINE METHYLSULFATE 1 MG/ML IJ SOLN
INTRAMUSCULAR | Status: DC | PRN
  Administered 2013-04-19: 4 mg via INTRAVENOUS

## 2013-04-19 MED ORDER — BUPIVACAINE HCL (PF) 0.75 % IJ SOLN
INTRAMUSCULAR | Status: AC
  Filled 2013-04-19: qty 10

## 2013-04-19 MED ORDER — HYPROMELLOSE (GONIOSCOPIC) 2.5 % OP SOLN
OPHTHALMIC | Status: AC
  Filled 2013-04-19: qty 15

## 2013-04-19 MED ORDER — LIDOCAINE HCL 2 % IJ SOLN
INTRAMUSCULAR | Status: AC
  Filled 2013-04-19: qty 20

## 2013-04-19 MED ORDER — BUPIVACAINE HCL (PF) 0.75 % IJ SOLN
INTRAMUSCULAR | Status: DC | PRN
  Administered 2013-04-19: 5 mL

## 2013-04-19 MED ORDER — FENTANYL CITRATE 0.05 MG/ML IJ SOLN
INTRAMUSCULAR | Status: DC | PRN
  Administered 2013-04-19: 50 ug via INTRAVENOUS

## 2013-04-19 MED ORDER — SUCCINYLCHOLINE CHLORIDE 20 MG/ML IJ SOLN
INTRAMUSCULAR | Status: DC | PRN
  Administered 2013-04-19: 100 mg via INTRAVENOUS

## 2013-04-19 MED ORDER — TRAMADOL HCL 50 MG PO TABS: 50.0000 mg | ORAL_TABLET | ORAL | Status: DC | PRN

## 2013-04-19 MED ORDER — PROVISC 10 MG/ML IO SOLN
INTRAOCULAR | Status: DC | PRN
  Administered 2013-04-19: 8.5 mg via INTRAOCULAR

## 2013-04-19 MED ORDER — BACITRACIN-POLYMYXIN B 500-10000 UNIT/GM OP OINT
TOPICAL_OINTMENT | OPHTHALMIC | Status: DC | PRN
  Administered 2013-04-19: 1 via OPHTHALMIC

## 2013-04-19 MED ORDER — ONDANSETRON HCL 4 MG/2ML IJ SOLN
INTRAMUSCULAR | Status: DC | PRN
  Administered 2013-04-19: 4 mg via INTRAVENOUS

## 2013-04-19 MED ORDER — ROCURONIUM BROMIDE 100 MG/10ML IV SOLN
INTRAVENOUS | Status: DC | PRN
  Administered 2013-04-19: 20 mg via INTRAVENOUS

## 2013-04-19 MED ORDER — LIDOCAINE HCL (CARDIAC) 20 MG/ML IV SOLN
INTRAVENOUS | Status: DC | PRN
  Administered 2013-04-19: 80 mg via INTRAVENOUS

## 2013-04-19 MED ORDER — PROPOFOL 10 MG/ML IV BOLUS
INTRAVENOUS | Status: DC | PRN
  Administered 2013-04-19: 50 mg via INTRAVENOUS
  Administered 2013-04-19: 100 mg via INTRAVENOUS

## 2013-04-19 SURGICAL SUPPLY — 60 items
APPLICATOR COTTON TIP 6IN STRL (MISCELLANEOUS) ×2 IMPLANT
APPLICATOR DR MATTHEWS STRL (MISCELLANEOUS) ×2 IMPLANT
BAG MINI COLL DRAIN (WOUND CARE) ×2 IMPLANT
BLADE EYE MINI 60D BEAVER (BLADE) IMPLANT
BLADE KERATOME 2.75 (BLADE) ×2 IMPLANT
BLADE STAB KNIFE 15DEG (BLADE) IMPLANT
CANNULA ANTERIOR CHAMBER 27GA (MISCELLANEOUS) IMPLANT
CLOTH BEACON ORANGE TIMEOUT ST (SAFETY) ×2 IMPLANT
DRAPE OPHTHALMIC 77X100 STRL (CUSTOM PROCEDURE TRAY) ×2 IMPLANT
DRAPE POUCH INSTRU U-SHP 10X18 (DRAPES) ×2 IMPLANT
DRSG TEGADERM 4X4.75 (GAUZE/BANDAGES/DRESSINGS) ×2 IMPLANT
FILTER BLUE MILLIPORE (MISCELLANEOUS) IMPLANT
GLOVE BIO SURGEON STRL SZ 6.5 (GLOVE) ×2 IMPLANT
GLOVE ECLIPSE STER SZ 5.5 (GLOVE) ×2 IMPLANT
GLOVE SS BIOGEL STRL SZ 6.5 (GLOVE) ×1 IMPLANT
GLOVE SUPERSENSE BIOGEL SZ 6.5 (GLOVE) ×1
GOWN SRG XL XLNG 56XLVL 4 (GOWN DISPOSABLE) ×1 IMPLANT
GOWN STRL NON-REIN LRG LVL3 (GOWN DISPOSABLE) ×2 IMPLANT
GOWN STRL NON-REIN XL XLG LVL4 (GOWN DISPOSABLE) ×1
KIT BASIN OR (CUSTOM PROCEDURE TRAY) ×2 IMPLANT
KIT ROOM TURNOVER OR (KITS) IMPLANT
KNIFE GRIESHABER SHARP 2.5MM (MISCELLANEOUS) ×2 IMPLANT
LENS IOL ACRYSOF MP POST 13.5 (Intraocular Lens) ×2 IMPLANT
MASK EYE SHIELD (GAUZE/BANDAGES/DRESSINGS) ×2 IMPLANT
NEEDLE 18GX1X1/2 (RX/OR ONLY) (NEEDLE) IMPLANT
NEEDLE 21 GA WING INFUSION (NEEDLE) IMPLANT
NEEDLE 22X1 1/2 (OR ONLY) (NEEDLE) ×2 IMPLANT
NEEDLE 25GX 5/8IN NON SAFETY (NEEDLE) ×2 IMPLANT
NEEDLE FILTER BLUNT 18X 1/2SAF (NEEDLE)
NEEDLE FILTER BLUNT 18X1 1/2 (NEEDLE) IMPLANT
NEEDLE HYPO 30X.5 LL (NEEDLE) ×4 IMPLANT
NS IRRIG 1000ML POUR BTL (IV SOLUTION) ×2 IMPLANT
PACK CATARACT CUSTOM (CUSTOM PROCEDURE TRAY) ×2 IMPLANT
PACK CATARACT MCHSCP (PACKS) ×2 IMPLANT
PACK COMBINED CATERACT/VIT 23G (OPHTHALMIC RELATED) IMPLANT
PAD ARMBOARD 7.5X6 YLW CONV (MISCELLANEOUS) ×4 IMPLANT
PAD EYE OVAL STERILE LF (GAUZE/BANDAGES/DRESSINGS) ×2 IMPLANT
PROBE ANTERIOR 20G W/INFUS NDL (MISCELLANEOUS) IMPLANT
RING MALYGIN (MISCELLANEOUS) IMPLANT
ROLLS DENTAL (MISCELLANEOUS) IMPLANT
SHUTTLE MONARCH TYPE A (NEEDLE) ×2 IMPLANT
SPEAR EYE SURG WECK-CEL (MISCELLANEOUS) IMPLANT
SUT ETHILON 10-0 CS-B-6CS-B-6 (SUTURE)
SUT ETHILON 5 0 P 3 18 (SUTURE)
SUT ETHILON 9 0 TG140 8 (SUTURE) IMPLANT
SUT NYLON ETHILON 5-0 P-3 1X18 (SUTURE) IMPLANT
SUT PLAIN 6 0 TG1408 (SUTURE) IMPLANT
SUT POLY NON ABSORB 10-0 8 STR (SUTURE) IMPLANT
SUT VICRYL 6 0 S 29 12 (SUTURE) IMPLANT
SUTURE EHLN 10-0 CS-B-6CS-B-6 (SUTURE) IMPLANT
SYR 20CC LL (SYRINGE) IMPLANT
SYR 5ML LL (SYRINGE) IMPLANT
SYR TB 1ML LUER SLIP (SYRINGE) IMPLANT
SYRINGE 10CC LL (SYRINGE) IMPLANT
TAPE PAPER MEDFIX 1IN X 10YD (GAUZE/BANDAGES/DRESSINGS) ×2 IMPLANT
TIP ABS 45DEG FLARED 0.9MM (TIP) ×2 IMPLANT
TOWEL OR 17X24 6PK STRL BLUE (TOWEL DISPOSABLE) ×4 IMPLANT
WATER STERILE IRR 1000ML POUR (IV SOLUTION) ×2 IMPLANT
WIPE INSTRUMENT ADHESIVE BACK (MISCELLANEOUS) ×2 IMPLANT
WIPE INSTRUMENT VISIWIPE 73X73 (MISCELLANEOUS) ×2 IMPLANT

## 2013-04-19 NOTE — Anesthesia Procedure Notes (Signed)
Procedure Name: Intubation Date/Time: 04/19/2013 12:35 PM Performed by: Eligha Bridegroom Pre-anesthesia Checklist: Emergency Drugs available, Patient identified and Suction available Patient Re-evaluated:Patient Re-evaluated prior to inductionOxygen Delivery Method: Circle system utilized Preoxygenation: Pre-oxygenation with 100% oxygen Intubation Type: IV induction Ventilation: Mask ventilation without difficulty and Oral airway inserted - appropriate to patient size Laryngoscope Size: Mac and 3 Grade View: Grade I Tube type: Oral Tube size: 7.5 mm Number of attempts: 1 Airway Equipment and Method: Stylet Placement Confirmation: ETT inserted through vocal cords under direct vision,  breath sounds checked- equal and bilateral and positive ETCO2 Secured at: 21 cm Tube secured with: Tape Dental Injury: Teeth and Oropharynx as per pre-operative assessment

## 2013-04-19 NOTE — Preoperative (Signed)
Beta Blockers   Reason not to administer Beta Blockers:Not Applicable

## 2013-04-19 NOTE — H&P (View-Only) (Signed)
Patient Record  Beth [name], [name]  Patient Number:  [id number] Date of Birth:  [birth year] Age:  72 years old    Gender:  Female Date of Evaluation:  Mar 24, 2013  Chief Complaint:   72 year old female is referred for Cataract evaluation. Her left eye has had poor vision x 5 years. She cannot read well, has to sit close to TV to see it, cannot recognize faces unless they are close.  History of Present Illness:   72 yo female diabetic referred by Dr Beth Beth [name] for diabetic eye exam.  She has had DM x 8 years. Her HgB A1C was over 7 on her last visit.  C/O blurred v ision.  No pain or discomfort.  No pus or mucus.  Presents for evaluationl( Reviewed by Doctor: GG) Past History:  Allergies:  nkda, Active Medications:   Other Medications:  Advair Diskus (fluticasone-salmeterol) disk with device 250-50 mcg/dose, simvastatin (simvastatin) tablet 20 mg, Tribenzor (olmesartan-amlodipin-hcthiazid) tablet 40-10-25 mg, Janumet (sitagliptin-metformin) tablet 50-1,000 mg, ProAir HFA (albuterol sulfate) HFA aerosol inhaler 90 mcg/actuation, albuterol sulfate (albuterol sulfate) solution for nebulization 0.63 mg/3 mL Birth History:  none Past Ocular History:  none Past Medical History:   diabetes  COPD  high blood pressure Past Surgical History:  none Family History:  no amblyopia, no blindness, no cataracts, no crossed eyes, no diabetic retinopathy, no glaucoma, no macular degeneration, no retinal detachment, no cancer, + diabetes (daughter), + heart disease (brother), + high blood pressure (mother, brother, father), + stroke (mother) Social History:   Smoking Status: former smoker  Alcohol:  none   Driving status:  not driving Marital status:  widowed Review of Systems:   Eyes: + decreased vision  All other systems are negative.  Examination:  Visual Acuity:   Distance VA [place]:  OD: 20/400    OS: 20/200 IOP:  OD:  14     OS:  14    @ 06:13PM (Goldmann applanation) Manifest Refraction:    Sphere     Cyl Axis       VA         Add       VA Prism Base R:  -4.00  -1.50  095    20/50                                  L:  -1.75                20/50                                    Confrontation visual field:  OU:  Normal  Motility:  OU:  Normal  Pupils:  OU:  Shape, size, direct and consensual reaction normal, No afferent defect  Adnexa:  Preauricular LN, lacrimal drainage, lacrimal glands, orbit normal  Eyelids:  Eyelids:  normal Conjunctiva: OU:  bulbar, palpebral normal  Cornea: OU:  epithelium, stroma, endothelium, tear film normal  Anterior Chamber: OU:  depth normal, no cell, no flare, 3+ deep / clear  Iris: OU:  normal, rubeosis absent Dilation:  OU: Tropicamide 1%, AK-Dilate @ 08:44AM Cycloplegic Refraction:    Sphere    Cyl Axis       VA   Prism Base R:  -4.00  -1.50   95    20/50  L:  -1.75                20/50     AScan Calulations: +11.00 Acrysof MA50BM PC IOL for emmetropia             Lens:  OD:  3+ nuclear sclerotic, 2+ cortical cataract OS:  3+ nuclear sclerotic, 2+ cortical cataract  Vitreous: OU:  normal  Optic Disc: OD:  cupping: 0.15   OS:  cupping: 0.15   Macula: OU:  normal  Vessels: OU:  normal  Periphery: OU:  normal   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 We discussed risks and benefits of surgery with illustrations.  Use of an implant lens was discussed with illustrations along with indicating that glasses will be needed post op for help with reading and making up any difference in distance power.  I have confirmed that her Cataracts are consistent with her reduced best corrected acuity of 20/50 OU. She desires to proceed with surgery on her right eye which is the worst eye  perceptually.  She indicated understanding our discussion and felt that her questions had been answered to her satisfaction.  She agrees with the treatment plan.  Patient Instructions: Do not eat or drink after midnight. Do not take any diabetic medication the morning of surgery. You may take your other medications with a small sip of water. Send letter to:  Willey Blade, MD, [facility], [address], [place], [place] [address] Return to clinic:  RV June 5th, 2014 for post-operative follow-up  Schedule:  Phacoemulsification, Posterior Chamber Intraocular Lens x 04/19/2013   (electronically signed)  Adonis Brook, MD

## 2013-04-19 NOTE — Progress Notes (Signed)
Spoke with Dr. Anderson Malta and she states the procedure is for the right eye and she will put in the correct consent order when she gets into PACU (she's in surgery at present time).  Consent will need to be signed in the holding area.

## 2013-04-19 NOTE — Progress Notes (Signed)
When I asked the patient which eye she was having surgery on, she states "I think the left, but I'm not sure".  I called and spoke with Dr. Anderson Malta again and she again states it is the right eye.  The eye drop orders are for the right eye.

## 2013-04-19 NOTE — Interval H&P Note (Signed)
History and Physical Interval Note:  04/19/2013 11:40 AM  Beth [name]  has presented today for surgery, with the diagnosis of Combined Cataract Right Eye  The various methods of treatment have been discussed with the patient and family. After consideration of risks, benefits and other options for treatment, the patient has consented to  Procedure(s): CATARACT EXTRACTION PHACO AND INTRAOCULAR LENS PLACEMENT ([place]) (Right) as a surgical intervention .  The patient's history has been reviewed, patient examined, no change in status, stable for surgery.  I have reviewed the patient's chart and labs.  Questions were answered to the patient's satisfaction.     Ashley Bultema, Lavella Hammock

## 2013-04-19 NOTE — Op Note (Signed)
Beth [name] 04/19/2013 Cataract: Combined, Nuclear  Procedure: Phacoemulsification, Posterior Chamber Intra-ocular Lens Operative Eye:  right eye  Surgeon: Adonis Brook Estimated Blood Loss: minimal Specimens for Pathology:  None Complications: none  The patient was prepared and draped in the usual manner for ocular surgery on the right eye. A Cook lid speculum was placed. A peripheral clear corneal incision was made at the surgical limbus centered at the 11:00 meridian. A separate clear corneal stab incision was made with a 15 degree blade at the 2:00 meridian to permit bi-manual technique. Viscoat and  Provisc as an underlying layer next to the capsule was instilled into the anterior chamber through that incision.  A keratome was used to create a self sealing incision entering the anterior chamber at the 11:00 meridian. A capsulorhexis was performed using a bent 25g needle. The lens was hydrodissected and the nucleus was hydrodilineated using a Nichammin cannula. The Chang chopper was inserted and used to rotate the lens to insure adequate lens mobility. The phacoemulsification handpiece was inserted and a combined phaco-chop technique was employed, fracturing the lens into separate sections with subsequent removal with the phaco handpiece.   The I/A cannula was used to remove remaining lens cortex. Provisc was instilled and used to deepen the anterior chamber and posterior capsule bag. The Monarch injector was used to place a folded Acrysof MA50BM PC IOL, + 11.00  diopters, into the capsule bag. A Sinskey lens hook was used to dial in the trailing haptic.  The I/A cannula was used to remove the viscoelastic from the anterior chamber. BSS was used to bring IOP to the desired range and the wound was checked to insure it was watertight. Subconjunctival injections of Ancef 100/0.17m and Dexamethasone 0.5 ml of a 168m1ml solution were placed without complication. The lid speculum and drapes were  removed and the patient's eye was patched with Polymixin/Bacitracin ophthalmic ointment. An eye shield was placed and the patient was transferred alert and conversant from the operating room to the post-operative recovery area.   GEAdonis BrookMD

## 2013-04-19 NOTE — Anesthesia Preprocedure Evaluation (Signed)
Anesthesia Evaluation  Patient identified by MRN, date of birth, ID band Patient awake    Reviewed: Allergy & Precautions, H&P , NPO status , Patient's Chart, lab work & pertinent test results  Airway Mallampati: II      Dental  (+) Edentulous Upper and Dental Advisory Given   Pulmonary  breath sounds clear to auscultation        Cardiovascular Rhythm:Regular Rate:Normal     Neuro/Psych    GI/Hepatic   Endo/Other    Renal/GU      Musculoskeletal   Abdominal   Peds  Hematology   Anesthesia Other Findings   Reproductive/Obstetrics                           Anesthesia Physical Anesthesia Plan  ASA: III  Anesthesia Plan: General   Post-op Pain Management:    Induction: Intravenous  Airway Management Planned: LMA  Additional Equipment:   Intra-op Plan:   Post-operative Plan:   Informed Consent: I have reviewed the patients History and Physical, chart, labs and discussed the procedure including the risks, benefits and alternatives for the proposed anesthesia with the patient or authorized representative who has indicated his/her understanding and acceptance.   Dental advisory given  Plan Discussed with: CRNA, Anesthesiologist and Surgeon  Anesthesia Plan Comments: (Cataract R. Eye Type 2 DM glucose 91 htn COPD OSA on CPAP  Plan GA with LMA  Roberts Gaudy, MD)        Anesthesia Quick Evaluation

## 2013-04-19 NOTE — Transfer of Care (Signed)
Immediate Anesthesia Transfer of Care Note  Patient: Beth [name]  Procedure(s) Performed: Procedure(s): CATARACT EXTRACTION PHACO AND INTRAOCULAR LENS PLACEMENT (IOC) (Right)  Patient Location: PACU  Anesthesia Type:General  Level of Consciousness: awake and patient cooperative  Airway & Oxygen Therapy: Patient Spontanous Breathing  Post-op Assessment: Report given to PACU RN, Post -op Vital signs reviewed and stable and Patient moving all extremities X 4  Post vital signs: Reviewed  Complications: No apparent anesthesia complications

## 2013-04-21 ENCOUNTER — Encounter (HOSPITAL_COMMUNITY): Payer: Self-pay | Admitting: Ophthalmology

## 2013-04-21 ENCOUNTER — Other Ambulatory Visit: Payer: Self-pay | Admitting: Ophthalmology

## 2013-04-21 NOTE — Anesthesia Postprocedure Evaluation (Signed)
  Anesthesia Post-op Note  Patient: Beth [name]  Procedure(s) Performed: Procedure(s): CATARACT EXTRACTION PHACO AND INTRAOCULAR LENS PLACEMENT (IOC) (Right)  Patient Location: PACU  Anesthesia Type:General  Level of Consciousness: awake, alert  and oriented  Airway and Oxygen Therapy: Patient Spontanous Breathing and Patient connected to nasal cannula oxygen  Post-op Pain: none  Post-op Assessment: Post-op Vital signs reviewed, Respiratory Function Stable, Patent Airway and Pain level controlled  Post-op Vital Signs: stable  Complications: No apparent anesthesia complications

## 2013-05-02 ENCOUNTER — Encounter (HOSPITAL_COMMUNITY): Payer: Self-pay | Admitting: *Deleted

## 2013-05-03 ENCOUNTER — Ambulatory Visit (HOSPITAL_COMMUNITY)
Admission: RE | Admit: 2013-05-03 | Discharge: 2013-05-03 | Disposition: A | Discharge status: Home / Self Care | Payer: Medicare Other | Source: Ambulatory Visit | Attending: Ophthalmology | Admitting: Ophthalmology

## 2013-05-03 ENCOUNTER — Encounter (HOSPITAL_COMMUNITY): Payer: Self-pay | Admitting: Vascular Surgery

## 2013-05-03 ENCOUNTER — Encounter (HOSPITAL_COMMUNITY): Payer: Self-pay | Admitting: *Deleted

## 2013-05-03 ENCOUNTER — Ambulatory Visit (HOSPITAL_COMMUNITY): Payer: Medicare Other | Admitting: Vascular Surgery

## 2013-05-03 ENCOUNTER — Encounter (HOSPITAL_COMMUNITY)
Admission: RE | Disposition: A | Discharge status: Home / Self Care | Payer: Self-pay | Source: Ambulatory Visit | Attending: Ophthalmology

## 2013-05-03 DIAGNOSIS — J449 Chronic obstructive pulmonary disease, unspecified: Secondary | ICD-10-CM | POA: Insufficient documentation

## 2013-05-03 DIAGNOSIS — H2510 Age-related nuclear cataract, unspecified eye: Secondary | ICD-10-CM | POA: Insufficient documentation

## 2013-05-03 DIAGNOSIS — Z79899 Other long term (current) drug therapy: Secondary | ICD-10-CM | POA: Insufficient documentation

## 2013-05-03 DIAGNOSIS — E1139 Type 2 diabetes mellitus with other diabetic ophthalmic complication: Secondary | ICD-10-CM | POA: Insufficient documentation

## 2013-05-03 DIAGNOSIS — IMO0002 Reserved for concepts with insufficient information to code with codable children: Secondary | ICD-10-CM | POA: Insufficient documentation

## 2013-05-03 DIAGNOSIS — J4489 Other specified chronic obstructive pulmonary disease: Secondary | ICD-10-CM | POA: Insufficient documentation

## 2013-05-03 DIAGNOSIS — Z8673 Personal history of transient ischemic attack (TIA), and cerebral infarction without residual deficits: Secondary | ICD-10-CM | POA: Insufficient documentation

## 2013-05-03 DIAGNOSIS — I10 Essential (primary) hypertension: Secondary | ICD-10-CM | POA: Insufficient documentation

## 2013-05-03 DIAGNOSIS — H25049 Posterior subcapsular polar age-related cataract, unspecified eye: Secondary | ICD-10-CM | POA: Insufficient documentation

## 2013-05-03 DIAGNOSIS — G4730 Sleep apnea, unspecified: Secondary | ICD-10-CM | POA: Insufficient documentation

## 2013-05-03 HISTORY — DX: Unspecified osteoarthritis, unspecified site: M19.90

## 2013-05-03 HISTORY — DX: Cerebral infarction, unspecified: I63.9

## 2013-05-03 HISTORY — PX: CATARACT EXTRACTION W/PHACO: SHX586

## 2013-05-03 LAB — BASIC METABOLIC PANEL
Chloride: 97 mEq/L (ref 96–112)
Creatinine, Ser: 0.85 mg/dL (ref 0.50–1.10)
GFR calc Af Amer: 77 mL/min — ABNORMAL LOW (ref >90)
Potassium: 3.8 mEq/L (ref 3.5–5.1)
Sodium: 135 mEq/L (ref 135–145)

## 2013-05-03 LAB — GLUCOSE, CAPILLARY: Glucose-Capillary: 124 mg/dL — ABNORMAL HIGH (ref 70–99)

## 2013-05-03 LAB — CBC
MCV: 91.8 fL (ref 78.0–100.0)
Platelets: 280 10*3/uL (ref 150–400)
RDW: 15.8 % — ABNORMAL HIGH (ref 11.5–15.5)
WBC: 8.4 10*3/uL (ref 4.0–10.5)

## 2013-05-03 SURGERY — PHACOEMULSIFICATION, CATARACT, WITH IOL INSERTION
Anesthesia: General | Site: Eye | Laterality: Left | Wound class: Clean

## 2013-05-03 MED ORDER — SODIUM CHLORIDE 0.9 % IV SOLN
INTRAVENOUS | Status: DC | PRN
  Administered 2013-05-03: 14:00:00 via INTRAVENOUS

## 2013-05-03 MED ORDER — PHENYLEPHRINE HCL 2.5 % OP SOLN
OPHTHALMIC | Status: AC
  Filled 2013-05-03: qty 2

## 2013-05-03 MED ORDER — BUPIVACAINE HCL (PF) 0.75 % IJ SOLN
INTRAMUSCULAR | Status: DC | PRN
  Administered 2013-05-03: 10 mL

## 2013-05-03 MED ORDER — GATIFLOXACIN 0.5 % OP SOLN
1.0000 [drp] | OPHTHALMIC | Status: AC | PRN
  Administered 2013-05-03 (×3): 1 [drp] via OPHTHALMIC
  Filled 2013-05-03: qty 2.5

## 2013-05-03 MED ORDER — ONDANSETRON HCL 4 MG/2ML IJ SOLN
INTRAMUSCULAR | Status: DC | PRN
  Administered 2013-05-03: 4 mg via INTRAVENOUS

## 2013-05-03 MED ORDER — CEFAZOLIN SUBCONJUNCTIVAL INJECTION 100 MG/0.5 ML
100.0000 mg | INJECTION | SUBCONJUNCTIVAL | Status: DC
  Filled 2013-05-03: qty 0.5

## 2013-05-03 MED ORDER — SODIUM CHLORIDE 0.9 % IV SOLN: INTRAVENOUS | Status: DC

## 2013-05-03 MED ORDER — GLYCOPYRROLATE 0.2 MG/ML IJ SOLN
INTRAMUSCULAR | Status: DC | PRN
  Administered 2013-05-03: 0.4 mg via INTRAVENOUS

## 2013-05-03 MED ORDER — BACITRACIN-POLYMYXIN B 500-10000 UNIT/GM OP OINT
TOPICAL_OINTMENT | OPHTHALMIC | Status: DC | PRN
  Administered 2013-05-03: 1 via OPHTHALMIC

## 2013-05-03 MED ORDER — ROCURONIUM BROMIDE 100 MG/10ML IV SOLN
INTRAVENOUS | Status: DC | PRN
  Administered 2013-05-03: 10 mg via INTRAVENOUS

## 2013-05-03 MED ORDER — LIDOCAINE HCL (CARDIAC) 20 MG/ML IV SOLN
INTRAVENOUS | Status: DC | PRN
  Administered 2013-05-03: 65 mg via INTRAVENOUS

## 2013-05-03 MED ORDER — NEOSTIGMINE METHYLSULFATE 1 MG/ML IJ SOLN
INTRAMUSCULAR | Status: DC | PRN
  Administered 2013-05-03: 2 mg via INTRAVENOUS

## 2013-05-03 MED ORDER — CEFAZOLIN SUBCONJUNCTIVAL INJECTION 100 MG/0.5 ML
INJECTION | SUBCONJUNCTIVAL | Status: DC | PRN
  Administered 2013-05-03: 100 mg via SUBCONJUNCTIVAL

## 2013-05-03 MED ORDER — EPINEPHRINE HCL 1 MG/ML IJ SOLN
INTRAOCULAR | Status: DC | PRN
  Administered 2013-05-03: 14:00:00

## 2013-05-03 MED ORDER — PROPOFOL 10 MG/ML IV BOLUS
INTRAVENOUS | Status: DC | PRN
  Administered 2013-05-03: 130 mg via INTRAVENOUS

## 2013-05-03 MED ORDER — NA CHONDROIT SULF-NA HYALURON 40-30 MG/ML IO SOLN
INTRAOCULAR | Status: DC | PRN
  Administered 2013-05-03: 0.5 mL via INTRAOCULAR

## 2013-05-03 MED ORDER — FENTANYL CITRATE 0.05 MG/ML IJ SOLN
INTRAMUSCULAR | Status: DC | PRN
  Administered 2013-05-03 (×2): 50 ug via INTRAVENOUS

## 2013-05-03 MED ORDER — PROVISC 10 MG/ML IO SOLN
INTRAOCULAR | Status: DC | PRN
  Administered 2013-05-03: .85 mL via INTRAOCULAR

## 2013-05-03 MED ORDER — PHENYLEPHRINE HCL 2.5 % OP SOLN
1.0000 [drp] | OPHTHALMIC | Status: AC | PRN
  Administered 2013-05-03 (×3): 1 [drp] via OPHTHALMIC

## 2013-05-03 MED ORDER — SODIUM CHLORIDE 0.9 % IV SOLN
Freq: Once | INTRAVENOUS | Status: AC
  Administered 2013-05-03: 13:00:00 via INTRAVENOUS

## 2013-05-03 MED ORDER — HYPROMELLOSE (GONIOSCOPIC) 2.5 % OP SOLN
OPHTHALMIC | Status: DC | PRN
  Administered 2013-05-03: 15 [drp] via OPHTHALMIC

## 2013-05-03 MED ORDER — TRAMADOL HCL 50 MG PO TABS: 50.0000 mg | ORAL_TABLET | ORAL | Status: DC | PRN

## 2013-05-03 MED ORDER — LIDOCAINE HCL 2 % IJ SOLN
INTRAMUSCULAR | Status: AC
  Filled 2013-05-03: qty 20

## 2013-05-03 MED ORDER — DEXAMETHASONE SODIUM PHOSPHATE 10 MG/ML IJ SOLN
INTRAMUSCULAR | Status: DC | PRN
  Administered 2013-05-03: 10 mg

## 2013-05-03 MED ORDER — TETRACAINE HCL 0.5 % OP SOLN
2.0000 [drp] | OPHTHALMIC | Status: AC
  Administered 2013-05-03: 2 [drp] via OPHTHALMIC
  Filled 2013-05-03: qty 2

## 2013-05-03 MED ORDER — BALANCED SALT IO SOLN
INTRAOCULAR | Status: DC | PRN
  Administered 2013-05-03: 15 mL via INTRAOCULAR

## 2013-05-03 MED ORDER — PREDNISOLONE ACETATE 1 % OP SUSP
1.0000 [drp] | OPHTHALMIC | Status: AC
  Administered 2013-05-03: 1 [drp] via OPHTHALMIC
  Filled 2013-05-03: qty 5

## 2013-05-03 MED ORDER — SUCCINYLCHOLINE CHLORIDE 20 MG/ML IJ SOLN
INTRAMUSCULAR | Status: DC | PRN
  Administered 2013-05-03: 100 mg via INTRAVENOUS

## 2013-05-03 SURGICAL SUPPLY — 61 items
APPLICATOR COTTON TIP 6IN STRL (MISCELLANEOUS) ×2 IMPLANT
APPLICATOR DR MATTHEWS STRL (MISCELLANEOUS) ×2 IMPLANT
BAG MINI COLL DRAIN (WOUND CARE) IMPLANT
BLADE EYE MINI 60D BEAVER (BLADE) IMPLANT
BLADE KERATOME 2.75 (BLADE) ×2 IMPLANT
BLADE STAB KNIFE 15DEG (BLADE) IMPLANT
CANNULA ANTERIOR CHAMBER 27GA (MISCELLANEOUS) IMPLANT
CLOTH BEACON ORANGE TIMEOUT ST (SAFETY) ×2 IMPLANT
DRAPE OPHTHALMIC 77X100 STRL (CUSTOM PROCEDURE TRAY) ×2 IMPLANT
DRAPE POUCH INSTRU U-SHP 10X18 (DRAPES) ×2 IMPLANT
DRSG TEGADERM 4X4.75 (GAUZE/BANDAGES/DRESSINGS) ×2 IMPLANT
FILTER BLUE MILLIPORE (MISCELLANEOUS) IMPLANT
GLOVE BIOGEL PI IND STRL 6.5 (GLOVE) ×1 IMPLANT
GLOVE BIOGEL PI INDICATOR 6.5 (GLOVE) ×1
GLOVE SS BIOGEL STRL SZ 6.5 (GLOVE) ×1 IMPLANT
GLOVE SUPERSENSE BIOGEL SZ 6.5 (GLOVE) ×1
GLOVE SURG SS PI 6.5 STRL IVOR (GLOVE) ×2 IMPLANT
GOWN SRG XL XLNG 56XLVL 4 (GOWN DISPOSABLE) ×1 IMPLANT
GOWN STRL NON-REIN LRG LVL3 (GOWN DISPOSABLE) ×4 IMPLANT
GOWN STRL NON-REIN XL XLG LVL4 (GOWN DISPOSABLE) ×1
KIT BASIN OR (CUSTOM PROCEDURE TRAY) ×2 IMPLANT
KIT ROOM TURNOVER OR (KITS) IMPLANT
KNIFE GRIESHABER SHARP 2.5MM (MISCELLANEOUS) ×2 IMPLANT
LENS IOL ACRYSOF MP POST 18.0 (Intraocular Lens) ×2 IMPLANT
MASK EYE SHIELD (GAUZE/BANDAGES/DRESSINGS) ×2 IMPLANT
NEEDLE 18GX1X1/2 (RX/OR ONLY) (NEEDLE) IMPLANT
NEEDLE 21 GA WING INFUSION (NEEDLE) IMPLANT
NEEDLE 22X1 1/2 (OR ONLY) (NEEDLE) ×2 IMPLANT
NEEDLE 25GX 5/8IN NON SAFETY (NEEDLE) ×4 IMPLANT
NEEDLE FILTER BLUNT 18X 1/2SAF (NEEDLE) ×1
NEEDLE FILTER BLUNT 18X1 1/2 (NEEDLE) ×1 IMPLANT
NEEDLE HYPO 30X.5 LL (NEEDLE) ×4 IMPLANT
NS IRRIG 1000ML POUR BTL (IV SOLUTION) ×2 IMPLANT
PACK CATARACT CUSTOM (CUSTOM PROCEDURE TRAY) ×2 IMPLANT
PACK CATARACT MCHSCP (PACKS) ×2 IMPLANT
PACK COMBINED CATERACT/VIT 23G (OPHTHALMIC RELATED) IMPLANT
PAD ARMBOARD 7.5X6 YLW CONV (MISCELLANEOUS) ×4 IMPLANT
PAD EYE OVAL STERILE LF (GAUZE/BANDAGES/DRESSINGS) ×2 IMPLANT
PROBE ANTERIOR 20G W/INFUS NDL (MISCELLANEOUS) IMPLANT
RING MALYGIN (MISCELLANEOUS) IMPLANT
ROLLS DENTAL (MISCELLANEOUS) IMPLANT
SHUTTLE MONARCH TYPE A (NEEDLE) ×2 IMPLANT
SPEAR EYE SURG WECK-CEL (MISCELLANEOUS) IMPLANT
SUT ETHILON 10-0 CS-B-6CS-B-6 (SUTURE)
SUT ETHILON 5 0 P 3 18 (SUTURE)
SUT ETHILON 9 0 TG140 8 (SUTURE) IMPLANT
SUT NYLON ETHILON 5-0 P-3 1X18 (SUTURE) IMPLANT
SUT PLAIN 6 0 TG1408 (SUTURE) IMPLANT
SUT POLY NON ABSORB 10-0 8 STR (SUTURE) IMPLANT
SUT VICRYL 6 0 S 29 12 (SUTURE) IMPLANT
SUTURE EHLN 10-0 CS-B-6CS-B-6 (SUTURE) IMPLANT
SYR 20CC LL (SYRINGE) IMPLANT
SYR 5ML LL (SYRINGE) IMPLANT
SYR TB 1ML LUER SLIP (SYRINGE) IMPLANT
SYRINGE 10CC LL (SYRINGE) IMPLANT
TAPE PAPER MEDFIX 1IN X 10YD (GAUZE/BANDAGES/DRESSINGS) ×2 IMPLANT
TIP ABS 45DEG FLARED 0.9MM (TIP) ×2 IMPLANT
TOWEL OR 17X24 6PK STRL BLUE (TOWEL DISPOSABLE) ×4 IMPLANT
WATER STERILE IRR 1000ML POUR (IV SOLUTION) ×2 IMPLANT
WIPE INSTRUMENT ADHESIVE BACK (MISCELLANEOUS) ×2 IMPLANT
WIPE INSTRUMENT VISIWIPE 73X73 (MISCELLANEOUS) ×2 IMPLANT

## 2013-05-03 NOTE — Op Note (Signed)
Jasmeet Shan 05/03/2013 Cataract: Combined, Nuclear  Procedure: Phacoemulsification, Posterior Chamber Intra-ocular Lens Operative Eye:  left eye  Surgeon: Adonis Brook Estimated Blood Loss: minimal Specimens for Pathology:  None Complications: none  The patient was prepared and draped in the usual manner for ocular surgery on the left eye. A Cook lid speculum was placed. A peripheral clear corneal incision was made at the surgical limbus centered at the 11:00 meridian. A separate clear corneal stab incision was made with a 15 degree blade at the 2:00 meridian to permit bi-manual technique. Viscoat and  Provisc as an underlying layer next to the capsule was instilled into the anterior chamber through that incision.  A keratome was used to create a self sealing incision entering the anterior chamber at the 11:00 meridian. A capsulorhexis was performed using a bent 25g needle. The lens was hydrodissected and the nucleus was hydrodilineated using a Nichammin cannula. The Chang chopper was inserted and used to rotate the lens to insure adequate lens mobility. The phacoemulsification handpiece was inserted and a combined phaco-chop technique was employed, fracturing the lens into separate sections with subsequent removal with the phaco handpiece.   The I/A cannula was used to remove remaining lens cortex. Provisc was instilled and used to deepen the anterior chamber and posterior capsule bag. The Monarch injector was used to place a folded Acrysof MA50BM PC IOL, + 17.00  diopters, into the capsule bag. A Sinskey lens hook was used to dial in the trailing haptic.  The I/A cannula was used to remove the viscoelastic from the anterior chamber. BSS was used to bring IOP to the desired range and the wound was checked to insure it was watertight. Subconjunctival injections of Ancef 100/0.39m and Dexamethasone 0.5 ml of a 167m1ml solution were placed without complication. The lid speculum and drapes were removed  and the patient's eye was patched with Polymixin/Bacitracin ophthalmic ointment. An eye shield was placed and the patient was transferred alert and conversant from the operating room to the post-operative recovery area.   GEAdonis BrookMD

## 2013-05-03 NOTE — H&P (Signed)
Patient Record  Beth [name], [place]  Patient Number: [id number]  Date of Birth: [birth year]  Age: 72 years old Gender: Female  Date of Evaluation: 6/5, 2014  Chief Complaint: 72 year old female is POD 1 post Phaco IOL OD. Her left eye has had poor vision x 5 years. She reports seeing better and had no overnight complaints. History of Present Illness: She has had DM x 8 years. Her HgB A1C was over 7 on her last visit.  C/O blurred v ision. No pain or discomfort. No pus or mucus. Presents for evaluationl( Reviewed by Doctor: GG)  Past History:  Allergies: nkda, Active  Medications:  Other Medications: Advair Diskus (fluticasone-salmeterol) disk with device 250-50 mcg/dose, simvastatin (simvastatin) tablet 20 mg, Tribenzor (olmesartan-amlodipin-hcthiazid) tablet 40-10-25 mg, Janumet (sitagliptin-metformin) tablet 50-1,000 mg, ProAir HFA (albuterol sulfate) HFA aerosol inhaler 90 mcg/actuation, albuterol sulfate (albuterol sulfate) solution for nebulization 0.63 mg/3 mL   Past Medical History:  diabetes  COPD  high blood pressure  Past Surgical History: none  Family History: no amblyopia, no blindness, no cataracts, no crossed eyes, no diabetic retinopathy, no glaucoma, no macular degeneration, no retinal detachment, no cancer, + diabetes (daughter), + heart disease (brother), + high blood pressure (mother, brother, father), + stroke (mother)  Social History:  Smoking Status: former smoker  Alcohol: none  Driving status: not driving  Marital status: widowed  Review of Systems:  Eyes: + decreased vision  All other systems are negative.  Examination:  Visual Acuity:  Distance VA [place]: OD: 20/50+2   OS: 20/200  IOP: OD: 14 OS: 14 @ 06:13PM (Goldmann applanation)  Manifest Refraction:  Sphere Cyl Axis VA Add VA Prism Base  L: -1.75 20/50   Confrontation visual field:  OU: Normal  Motility:  OU: Normal  Pupils:  OU: Shape, size, direct and consensual reaction normal, No afferent defect    Adnexa:  Preauricular LN, lacrimal drainage, lacrimal glands, orbit normal  Eyelids:  Eyelids: normal  Conjunctiva: OD: injection OS: bulbar, palpebral normal  Cornea: OD: spk  OS: epithelium, stroma, endothelium, tear film normal  Anterior Chamber: OU: 3+ deep, depth normal, no cell, no flare, 3+ deep / clear OS             OD: trace flare, rare cell Iris: OU: normal, rubeosis absent  Dilation: OU: Tropicamide 1%, AK-Dilate @ 08:44AM   AScan Calulations: +17.00 Acrysof MA50BM PC IOL for emmetropia OS Lens:  OD: centered PC IOL  OS: 3+ nuclear sclerotic, 2+ cortical cataract  Vitreous: OU: normal  Optic Disc: OD: cupping: 0.15 OS: cupping: 0.15  Macula: OU: normal  Vessels: OU: normal  Periphery: OU: normal  Impression:  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 We discussed risks and benefits of surgery with illustrations. Use of an implant lens was discussed with illustrations along with indicating that glasses will be needed post op for help with reading and making up any difference in distance power.  I have confirmed that her Cataracts are consistent with her reduced best corrected acuity of 20/50 OU. She desires to proceed with surgery on her right eye which is the worst eye perceptually.  She indicated understanding our discussion and felt that her questions had  been answered to her satisfaction.  She agrees with the treatment plan.  Patient Instructions: Do not eat or drink after midnight. Do not take any diabetic medication the morning of surgery. You may take your other medications with a small sip of water.  Send letter to:  Willey Blade, MD, [facility], [address],  [place], [place] [address]  Return to clinic: RV June 5th, 2014 for post-operative follow-up  Schedule:  Phacoemulsification, Posterior Chamber Intraocular Lens Left Eye x 05/03/2013  (electronically signed)   Adonis Brook, MD

## 2013-05-03 NOTE — Interval H&P Note (Signed)
The planned  post-op  follow up date is 05/04/2013 at 4:45PM.   History and Physical Interval Note:  05/03/2013 1:51 PM  Beth [name]  has presented today for surgery, with the diagnosis of Combined Cataract Left Eye  The various methods of treatment have been discussed with the patient and family. After consideration of risks, benefits and other options for treatment, the patient has consented to  Procedure(s): CATARACT EXTRACTION PHACO AND INTRAOCULAR LENS PLACEMENT ([place]) (Left) as a surgical intervention .  The patient's history has been reviewed, patient examined, no change in status, stable for surgery.  I have reviewed the patient's chart and labs.  Questions were answered to the patient's satisfaction.     Josel Keo, Lavella Hammock

## 2013-05-03 NOTE — Anesthesia Preprocedure Evaluation (Signed)
Anesthesia Evaluation  Patient identified by MRN, date of birth, ID band Patient awake    Reviewed: Allergy & Precautions, H&P , NPO status   Airway Mallampati: I      Dental  (+) Edentulous Upper   Pulmonary shortness of breath, asthma , sleep apnea , COPD breath sounds clear to auscultation        Cardiovascular hypertension, Rhythm:Regular Rate:Normal     Neuro/Psych CVA    GI/Hepatic   Endo/Other  diabetes  Renal/GU      Musculoskeletal   Abdominal   Peds  Hematology   Anesthesia Other Findings   Reproductive/Obstetrics                           Anesthesia Physical Anesthesia Plan  ASA: III  Anesthesia Plan: General   Post-op Pain Management:    Induction: Intravenous  Airway Management Planned: LMA  Additional Equipment:   Intra-op Plan:   Post-operative Plan: Extubation in OR  Informed Consent: I have reviewed the patients History and Physical, chart, labs and discussed the procedure including the risks, benefits and alternatives for the proposed anesthesia with the patient or authorized representative who has indicated his/her understanding and acceptance.   Dental advisory given  Plan Discussed with: CRNA and Surgeon  Anesthesia Plan Comments:         Anesthesia Quick Evaluation

## 2013-05-03 NOTE — Anesthesia Procedure Notes (Signed)
Procedure Name: Intubation Date/Time: 05/03/2013 1:57 PM Performed by: Trixie Deis A Pre-anesthesia Checklist: Patient identified, Emergency Drugs available, Suction available, Patient being monitored and Timeout performed Patient Re-evaluated:Patient Re-evaluated prior to inductionOxygen Delivery Method: Circle system utilized Preoxygenation: Pre-oxygenation with 100% oxygen Intubation Type: IV induction Ventilation: Mask ventilation without difficulty and Oral airway inserted - appropriate to patient size Laryngoscope Size: Mac and 3 Grade View: Grade I Tube type: Oral Tube size: 7.5 mm Number of attempts: 1 Airway Equipment and Method: Stylet and LTA kit utilized Placement Confirmation: ETT inserted through vocal cords under direct vision,  breath sounds checked- equal and bilateral and positive ETCO2 Secured at: 21 cm Tube secured with: Tape Dental Injury: Teeth and Oropharynx as per pre-operative assessment

## 2013-05-03 NOTE — Transfer of Care (Signed)
Immediate Anesthesia Transfer of Care Note  Patient: Beth [name]  Procedure(s) Performed: Procedure(s): CATARACT EXTRACTION PHACO AND INTRAOCULAR LENS PLACEMENT (IOC) (Left)  Patient Location: PACU  Anesthesia Type:General  Level of Consciousness: awake, alert  and oriented  Airway & Oxygen Therapy: Patient Spontanous Breathing and Patient connected to nasal cannula oxygen  Post-op Assessment: Report given to PACU RN and Post -op Vital signs reviewed and stable  Post vital signs: Reviewed and stable  Complications: No apparent anesthesia complications

## 2013-05-03 NOTE — Preoperative (Signed)
Beta Blockers   Reason not to administer Beta Blockers:Not Applicable 

## 2013-05-04 NOTE — Anesthesia Postprocedure Evaluation (Signed)
  Anesthesia Post-op Note  Patient: Beth [name]  Procedure(s) Performed: Procedure(s): CATARACT EXTRACTION PHACO AND INTRAOCULAR LENS PLACEMENT (IOC) (Left)  Patient Location: PACU  Anesthesia Type:General  Level of Consciousness: awake  Airway and Oxygen Therapy: Patient Spontanous Breathing  Post-op Pain: mild  Post-op Assessment: Post-op Vital signs reviewed  Post-op Vital Signs: stable  Complications: No apparent anesthesia complications

## 2013-05-05 ENCOUNTER — Encounter (HOSPITAL_COMMUNITY): Payer: Self-pay | Admitting: Ophthalmology

## 2013-11-28 ENCOUNTER — Inpatient Hospital Stay (HOSPITAL_COMMUNITY)
Admission: EM | Admit: 2013-11-28 | Discharge: 2013-12-04 | DRG: 190 | Disposition: A | Discharge status: Home Health | Payer: Medicare HMO | Attending: Internal Medicine | Admitting: Internal Medicine

## 2013-11-28 ENCOUNTER — Encounter (HOSPITAL_COMMUNITY): Payer: Self-pay | Admitting: Emergency Medicine

## 2013-11-28 DIAGNOSIS — J449 Chronic obstructive pulmonary disease, unspecified: Secondary | ICD-10-CM

## 2013-11-28 DIAGNOSIS — J440 Chronic obstructive pulmonary disease with acute lower respiratory infection: Secondary | ICD-10-CM | POA: Diagnosis present

## 2013-11-28 DIAGNOSIS — E1165 Type 2 diabetes mellitus with hyperglycemia: Secondary | ICD-10-CM

## 2013-11-28 DIAGNOSIS — Z8249 Family history of ischemic heart disease and other diseases of the circulatory system: Secondary | ICD-10-CM

## 2013-11-28 DIAGNOSIS — T380X5A Adverse effect of glucocorticoids and synthetic analogues, initial encounter: Secondary | ICD-10-CM | POA: Diagnosis present

## 2013-11-28 DIAGNOSIS — R0603 Acute respiratory distress: Secondary | ICD-10-CM

## 2013-11-28 DIAGNOSIS — G4730 Sleep apnea, unspecified: Secondary | ICD-10-CM | POA: Diagnosis present

## 2013-11-28 DIAGNOSIS — J9600 Acute respiratory failure, unspecified whether with hypoxia or hypercapnia: Secondary | ICD-10-CM | POA: Diagnosis present

## 2013-11-28 DIAGNOSIS — J441 Chronic obstructive pulmonary disease with (acute) exacerbation: Principal | ICD-10-CM | POA: Diagnosis present

## 2013-11-28 DIAGNOSIS — I10 Essential (primary) hypertension: Secondary | ICD-10-CM | POA: Diagnosis present

## 2013-11-28 DIAGNOSIS — Z79899 Other long term (current) drug therapy: Secondary | ICD-10-CM

## 2013-11-28 DIAGNOSIS — IMO0001 Reserved for inherently not codable concepts without codable children: Secondary | ICD-10-CM | POA: Diagnosis present

## 2013-11-28 DIAGNOSIS — E1129 Type 2 diabetes mellitus with other diabetic kidney complication: Secondary | ICD-10-CM | POA: Diagnosis present

## 2013-11-28 DIAGNOSIS — N1832 Chronic kidney disease, stage 3b: Secondary | ICD-10-CM | POA: Diagnosis present

## 2013-11-28 DIAGNOSIS — J45901 Unspecified asthma with (acute) exacerbation: Principal | ICD-10-CM

## 2013-11-28 DIAGNOSIS — J209 Acute bronchitis, unspecified: Secondary | ICD-10-CM | POA: Diagnosis present

## 2013-11-28 DIAGNOSIS — Z888 Allergy status to other drugs, medicaments and biological substances status: Secondary | ICD-10-CM

## 2013-11-28 DIAGNOSIS — Z8673 Personal history of transient ischemic attack (TIA), and cerebral infarction without residual deficits: Secondary | ICD-10-CM

## 2013-11-28 DIAGNOSIS — Z87891 Personal history of nicotine dependence: Secondary | ICD-10-CM

## 2013-11-28 DIAGNOSIS — E118 Type 2 diabetes mellitus with unspecified complications: Secondary | ICD-10-CM | POA: Diagnosis present

## 2013-11-28 DIAGNOSIS — E1110 Type 2 diabetes mellitus with ketoacidosis without coma: Secondary | ICD-10-CM

## 2013-11-28 DIAGNOSIS — M129 Arthropathy, unspecified: Secondary | ICD-10-CM | POA: Diagnosis present

## 2013-11-28 DIAGNOSIS — E785 Hyperlipidemia, unspecified: Secondary | ICD-10-CM | POA: Diagnosis present

## 2013-11-28 LAB — CBC WITH DIFFERENTIAL/PLATELET
Basophils Absolute: 0 10*3/uL (ref 0.0–0.1)
Basophils Relative: 0 % (ref 0–1)
EOS ABS: 0 10*3/uL (ref 0.0–0.7)
Eosinophils Relative: 0 % (ref 0–5)
HCT: 42.1 % (ref 36.0–46.0)
Hemoglobin: 14 g/dL (ref 12.0–15.0)
LYMPHS ABS: 0.9 10*3/uL (ref 0.7–4.0)
LYMPHS PCT: 11 % — AB (ref 12–46)
MCH: 31.1 pg (ref 26.0–34.0)
MCHC: 33.3 g/dL (ref 30.0–36.0)
MCV: 93.6 fL (ref 78.0–100.0)
Monocytes Absolute: 0.3 10*3/uL (ref 0.1–1.0)
Monocytes Relative: 4 % (ref 3–12)
NEUTROS PCT: 85 % — AB (ref 43–77)
Neutro Abs: 6.3 10*3/uL (ref 1.7–7.7)
PLATELETS: 231 10*3/uL (ref 150–400)
RBC: 4.5 MIL/uL (ref 3.87–5.11)
RDW: 16 % — ABNORMAL HIGH (ref 11.5–15.5)
WBC: 7.4 10*3/uL (ref 4.0–10.5)

## 2013-11-28 LAB — POCT I-STAT, CHEM 8
BUN: 9 mg/dL (ref 6–23)
CREATININE: 0.7 mg/dL (ref 0.50–1.10)
Calcium, Ion: 1.17 mmol/L (ref 1.13–1.30)
Chloride: 99 mEq/L (ref 96–112)
Glucose, Bld: 205 mg/dL — ABNORMAL HIGH (ref 70–99)
HCT: 46 % (ref 36.0–46.0)
Hemoglobin: 15.6 g/dL — ABNORMAL HIGH (ref 12.0–15.0)
Potassium: 4.3 mEq/L (ref 3.7–5.3)
Sodium: 137 mEq/L (ref 137–147)
TCO2: 28 mmol/L (ref 0–100)

## 2013-11-28 LAB — CG4 I-STAT (LACTIC ACID): Lactic Acid, Venous: 0.94 mmol/L (ref 0.5–2.2)

## 2013-11-28 MED ORDER — METHYLPREDNISOLONE SODIUM SUCC 125 MG IJ SOLR
125.0000 mg | Freq: Once | INTRAMUSCULAR | Status: AC
Start: 2013-11-28 — End: 2013-11-28
  Administered 2013-11-28: 125 mg via INTRAVENOUS
  Filled 2013-11-28: qty 2

## 2013-11-28 MED ORDER — ALBUTEROL SULFATE (2.5 MG/3ML) 0.083% IN NEBU
5.0000 mg | INHALATION_SOLUTION | Freq: Once | RESPIRATORY_TRACT | Status: AC
  Administered 2013-11-28: 5 mg via RESPIRATORY_TRACT
  Filled 2013-11-28: qty 6

## 2013-11-28 MED ORDER — AZITHROMYCIN 250 MG PO TABS
500.0000 mg | ORAL_TABLET | Freq: Once | ORAL | Status: AC
  Administered 2013-11-28: 500 mg via ORAL
  Filled 2013-11-28: qty 2

## 2013-11-28 NOTE — ED Notes (Addendum)
Per daughter, pt has been taking dilacor and ever since then she has had altered mental status

## 2013-11-28 NOTE — ED Notes (Signed)
Cg-4 shown to Dr. Sabra Heck

## 2013-11-28 NOTE — ED Notes (Addendum)
Pt sent to ED from [facility] with c/o shortness of breath.  Family st's they did a CXR and was told she had pneumonia, pt also has COPD.  Productive cough x's 3 days.  Pt unable to speak in full sentences due to shortness of breath.  Pt placed on 02 in triage at 2LPM via nasal cannula.

## 2013-11-28 NOTE — ED Provider Notes (Signed)
CSN: [id number]     Arrival date & time 11/28/13  2122 History   First MD Initiated Contact with Patient 11/28/13 2255     Chief Complaint  Patient presents with  . Shortness of Breath   (Consider location/radiation/quality/duration/timing/severity/associated sxs/prior Treatment) HPI Comments: 73 year old female, history of diabetes, hypertension, hyperlipidemia and COPD who presents with the complaint of shortness of breath that started 3 days ago, has gradually progressed and is now severe. She presented to an outside urgent care where she was diagnosed with pneumonia on x-ray, treated with albuterol nebulizer, Rocephin intramuscular 1 g, steroid intramuscular and sent for further evaluation at the hospital. Her oxygen saturations were reportedly in the 70% range on arrival, she does not use home oxygen. She is not sure the last time she was on oral prednisone.  Patient is a 73 y.o. female presenting with shortness of breath. The history is provided by the patient and a relative.  Shortness of Breath   Past Medical History  Diagnosis Date  . Diabetes mellitus     type 2  . Hyperlipidemia   . Hypertension   . COPD (chronic obstructive pulmonary disease)     dR. wERT  . Shortness of breath     WITH EXERTION   . Asthma   . Sleep apnea     USES  CPAP   . Arthritis   . Stroke    Past Surgical History  Procedure Laterality Date  . Tubal ligation    . Cataract extraction w/phaco Right 04/19/2013    Procedure: CATARACT EXTRACTION PHACO AND INTRAOCULAR LENS PLACEMENT (IOC);  Surgeon: Adonis Brook, MD;  Location: [facility];  Service: Ophthalmology;  Laterality: Right;  . Eye surgery    . Cataract extraction w/phaco Left 05/03/2013    Procedure: CATARACT EXTRACTION PHACO AND INTRAOCULAR LENS PLACEMENT (IOC);  Surgeon: Adonis Brook, MD;  Location: [facility];  Service: Ophthalmology;  Laterality: Left;   Family History  Problem Relation Age of Onset  . Heart disease Mother   . Heart disease  Brother   . Heart disease Brother   . Heart disease Sister   . Heart disease Sister    History  Substance Use Topics  . Smoking status: Former Smoker -- 1.50 packs/day for 40 years    Types: Cigarettes    Quit date: 11/16/2004  . Smokeless tobacco: Never Used  . Alcohol Use: Yes     Comment: occassional   OB History   Grav Para Term Preterm Abortions TAB SAB Ect Mult Living                 Review of Systems  Respiratory: Positive for shortness of breath.   All other systems reviewed and are negative.    Allergies  Aldomet  Home Medications   Current Outpatient Rx  Name  Route  Sig  Dispense  Refill  . albuterol (ACCUNEB) 0.63 MG/3ML nebulizer solution   Nebulization   Take 1 ampule by nebulization every 6 (six) hours as needed for wheezing.          Marland Kitchen albuterol (PROVENTIL HFA;VENTOLIN HFA) 108 (90 BASE) MCG/ACT inhaler   Inhalation   Inhale 2 puffs into the lungs every 6 (six) hours as needed for wheezing.          . budesonide-formoterol (SYMBICORT) 160-4.5 MCG/ACT inhaler   Inhalation   Inhale 1 puff into the lungs 2 (two) times daily. Take 2 puffs first thing in am and then another 2 puffs about 12  hours later.         . furosemide (LASIX) 20 MG tablet   Oral   Take 1 tablet by mouth daily.         . simvastatin (ZOCOR) 20 MG tablet   Oral   Take 20 mg by mouth every evening.         . sitaGLIPtan-metformin (JANUMET) 50-1000 MG per tablet   Oral   Take 1 tablet by mouth 2 (two) times daily with a meal.         . TRIBENZOR 40-10-25 MG TABS   Oral   Take 1 tablet by mouth daily.          BP 135/54  Pulse 87  Temp(Src) 97.9 F (36.6 C) (Oral)  Resp 17  Ht _0  (1.575 m)  Wt 176 lb (79.833 kg)  BMI 32.18 kg/m2  SpO2 95% Physical Exam  Nursing note and vitals reviewed. Constitutional: She appears well-developed and well-nourished.  Mildly tachypneic  HENT:  Head: Normocephalic and atraumatic.  Mouth/Throat: No oropharyngeal  exudate.  Mucous membranes dehydrated  Eyes: Conjunctivae and EOM are normal. Pupils are equal, round, and reactive to light. Right eye exhibits no discharge. Left eye exhibits no discharge. No scleral icterus.  Neck: Normal range of motion. Neck supple. No JVD present. No thyromegaly present.  Cardiovascular: Normal rate, regular rhythm, normal heart sounds and intact distal pulses.  Exam reveals no gallop and no friction rub.   No murmur heard. Pulmonary/Chest: She is in respiratory distress ( Mild respiratory distress with tachypnea but speaks in full sentences, not using accessory muscles). She has wheezes ( Mild end expiratory wheezes, slight prolonged expiratory phase). She has no rales.  Abdominal: Soft. Bowel sounds are normal. She exhibits no distension and no mass. There is no tenderness.  Musculoskeletal: Normal range of motion. She exhibits no edema and no tenderness.  Lymphadenopathy:    She has no cervical adenopathy.  Neurological: She is alert. Coordination normal.  Skin: Skin is warm and dry. No rash noted. No erythema.  Psychiatric: She has a normal mood and affect. Her behavior is normal.    ED Course  Procedures (including critical care time) Labs Review Labs Reviewed  CBC WITH DIFFERENTIAL - Abnormal; Notable for the following:    RDW 16.0 (*)    Neutrophils Relative % 85 (*)    Lymphocytes Relative 11 (*)    All other components within normal limits  POCT I-STAT, CHEM 8 - Abnormal; Notable for the following:    Glucose, Bld 205 (*)    Hemoglobin 15.6 (*)    All other components within normal limits  CG4 I-STAT (LACTIC ACID)   Imaging Review Dg Chest 2 View  11/29/2013   CLINICAL DATA:  Cough.  Shortness of breath.  EXAM: CHEST  2 VIEW  COMPARISON:  05/31/2012, 10/06/2010, 07/11/2009.  FINDINGS: Cardiac silhouette mildly enlarged but stable. Thoracic aorta mildly atherosclerotic, unchanged. Hilar and mediastinal contours otherwise unremarkable. Prominent  bronchovascular markings diffusely and moderate central peribronchial thickening, more so than on the prior examinations. Stable mild hyperinflation. Lungs otherwise clear. No localized airspace consolidation. No pleural effusions. No pneumothorax. Normal pulmonary vascularity. Visualized bony thorax intact.  IMPRESSION: Moderate changes of acute bronchitis and/or asthma without localized airspace pneumonia. Stable mild cardiomegaly without pulmonary edema.   Electronically Signed   By: Evangeline Dakin M.D.   On: 11/29/2013 00:43   Ct Angio Chest Pe W/cm &/or Wo Cm  11/29/2013   CLINICAL DATA:  Chest pain.  Shortness of breath.  Tachycardia.  EXAM: CT ANGIOGRAPHY CHEST WITH CONTRAST  TECHNIQUE: Multidetector CT imaging of the chest was performed using the standard protocol during bolus administration of intravenous contrast. Multiplanar CT image reconstructions and MIPs were obtained to evaluate the vascular anatomy.  CONTRAST:  175m OMNIPAQUE IOHEXOL 350 MG/ML IV.  COMPARISON:  None.  FINDINGS: Contrast opacification of the pulmonary arteries is very good. No filling defects within either main pulmonary artery or their branches in either lung to suggest pulmonary embolism. Heart mildly enlarged with left ventricular predominance and mild left ventricular hypertrophy. No significant pericardial effusion. Moderate to severe LAD coronary atherosclerosis. Moderate aortic valvular calcification. Moderate to severe atherosclerosis involving the thoracic upper abdominal aorta without aneurysm or dissection. Bovine aortic arch anatomy (left common carotid artery arises from the innominate artery).  Emphysematous changes throughout both lungs. Scar and/or atelectasis in the lingula and right middle lobe. Tiny peripheral blebs in the right upper lobe. Central airways patent with calcified tracheobronchial cartilages and moderate to severe central bronchial wall thickening. No localized airspace consolidation. No  pulmonary parenchymal nodules or masses. No pleural effusions.  Normal and upper normal sized bilateral hilar lymph nodes, the largest in the right hilum approximating 1.5 x 1.7 cm, statistically reactive. No significant mediastinal or axillary lymphadenopathy. Visualized thyroid gland unremarkable.  Numerous simple cysts involving the visualized liver. Visualized upper abdomen otherwise unremarkable for the early arterial phase of enhancement. Bone window images unremarkable.  Review of the MIP images confirms the above findings.  IMPRESSION: 1. No evidence of pulmonary embolism. 2. COPD/emphysema. Minimal scar/atelectasis in the lingula and right middle lobe. Marked central peribronchial thickening consistent with bronchitis and/or asthma.   Electronically Signed   By: TEvangeline DakinM.D.   On: 11/29/2013 01:46    EKG Interpretation   None       MDM   1. Respiratory distress   2. COPD (chronic obstructive pulmonary disease)    The patient appears to be in mild respiratory distress with significant hypoxia, she is afebrile, normotensive at this time with a systolic of 1594on my exam. Chest x-ray is unable to be viewed on the outside CT that she brought with her, records reviewed from urgent care, she'll need to be admitted due to her significant hypoxia with underlying obstructive pulmonary disease. Chest x-ray, labs, albuterol treatment, complete steroid dose as she was given half dose at urgent care, add Zithromax to Rocephin given at urgent care.  Couple doses of albuterol were given, oxygen saturation has improved, she is still requiring supplemental oxygen, chest x-ray and CT scan show no signs of pulmonary embolism or pneumonia, likely COPD exacerbation.  Discussed with the hospitalist who will admit to the telemetry unit.  Meds given in ED:  Medications  albuterol (PROVENTIL) (2.5 MG/3ML) 0.083% nebulizer solution 5 mg (5 mg Nebulization Given 11/28/13 2307)  azithromycin (ZITHROMAX)  tablet 500 mg (500 mg Oral Given 11/28/13 2318)  methylPREDNISolone sodium succinate (SOLU-MEDROL) 125 mg/2 mL injection 125 mg (125 mg Intravenous Given 11/28/13 2327)  iohexol (OMNIPAQUE) 350 MG/ML injection 100 mL (100 mLs Intravenous Contrast Given 11/29/13 0130)  albuterol (PROVENTIL) (2.5 MG/3ML) 0.083% nebulizer solution 5 mg (5 mg Nebulization Given 11/29/13 0153)      BJohnna Acosta MD 11/29/13 [phone]

## 2013-11-29 ENCOUNTER — Encounter (HOSPITAL_COMMUNITY): Payer: Self-pay | Admitting: Radiology

## 2013-11-29 ENCOUNTER — Emergency Department (HOSPITAL_COMMUNITY): Payer: Medicare HMO

## 2013-11-29 DIAGNOSIS — E118 Type 2 diabetes mellitus with unspecified complications: Secondary | ICD-10-CM | POA: Diagnosis present

## 2013-11-29 DIAGNOSIS — N1832 Chronic kidney disease, stage 3b: Secondary | ICD-10-CM | POA: Diagnosis present

## 2013-11-29 DIAGNOSIS — J441 Chronic obstructive pulmonary disease with (acute) exacerbation: Secondary | ICD-10-CM | POA: Diagnosis present

## 2013-11-29 DIAGNOSIS — J209 Acute bronchitis, unspecified: Secondary | ICD-10-CM | POA: Diagnosis present

## 2013-11-29 DIAGNOSIS — I10 Essential (primary) hypertension: Secondary | ICD-10-CM | POA: Diagnosis present

## 2013-11-29 DIAGNOSIS — E1129 Type 2 diabetes mellitus with other diabetic kidney complication: Secondary | ICD-10-CM | POA: Diagnosis present

## 2013-11-29 LAB — HEMOGLOBIN A1C
Hgb A1c MFr Bld: 6.8 % — ABNORMAL HIGH (ref <5.7)
Mean Plasma Glucose: 148 mg/dL — ABNORMAL HIGH (ref <117)

## 2013-11-29 LAB — CBC
HCT: 42.5 % (ref 36.0–46.0)
Hemoglobin: 13.8 g/dL (ref 12.0–15.0)
MCH: 30 pg (ref 26.0–34.0)
MCHC: 32.5 g/dL (ref 30.0–36.0)
MCV: 92.4 fL (ref 78.0–100.0)
PLATELETS: 244 10*3/uL (ref 150–400)
RBC: 4.6 MIL/uL (ref 3.87–5.11)
RDW: 16 % — ABNORMAL HIGH (ref 11.5–15.5)
WBC: 6.7 10*3/uL (ref 4.0–10.5)

## 2013-11-29 LAB — GLUCOSE, CAPILLARY
GLUCOSE-CAPILLARY: 176 mg/dL — AB (ref 70–99)
Glucose-Capillary: 213 mg/dL — ABNORMAL HIGH (ref 70–99)
Glucose-Capillary: 203 mg/dL — ABNORMAL HIGH (ref 70–99)
Glucose-Capillary: 252 mg/dL — ABNORMAL HIGH (ref 70–99)

## 2013-11-29 LAB — CREATININE, SERUM
CREATININE: 0.67 mg/dL (ref 0.50–1.10)
GFR calc Af Amer: >90 mL/min (ref >90)
GFR calc non Af Amer: 86 mL/min — ABNORMAL LOW (ref >90)

## 2013-11-29 LAB — STREP PNEUMONIAE URINARY ANTIGEN: Strep Pneumo Urinary Antigen: NEGATIVE

## 2013-11-29 MED ORDER — ZOLPIDEM TARTRATE 5 MG PO TABS: 5.0000 mg | ORAL_TABLET | Freq: Every evening | ORAL | Status: DC | PRN

## 2013-11-29 MED ORDER — LINAGLIPTIN 5 MG PO TABS
5.0000 mg | ORAL_TABLET | Freq: Every day | ORAL | Status: DC
  Administered 2013-11-29 – 2013-12-04 (×6): 5 mg via ORAL
  Filled 2013-11-29 (×7): qty 1

## 2013-11-29 MED ORDER — ENOXAPARIN SODIUM 40 MG/0.4ML ~~LOC~~ SOLN
40.0000 mg | Freq: Every day | SUBCUTANEOUS | Status: DC
  Administered 2013-11-29 – 2013-12-04 (×6): 40 mg via SUBCUTANEOUS
  Filled 2013-11-29 (×6): qty 0.4

## 2013-11-29 MED ORDER — LEVOFLOXACIN IN D5W 500 MG/100ML IV SOLN
500.0000 mg | Freq: Every day | INTRAVENOUS | Status: DC
  Administered 2013-11-29 – 2013-12-02 (×4): 500 mg via INTRAVENOUS
  Filled 2013-11-29 (×4): qty 100

## 2013-11-29 MED ORDER — OXYCODONE HCL 5 MG PO TABS
5.0000 mg | ORAL_TABLET | ORAL | Status: DC | PRN
  Administered 2013-12-02 – 2013-12-03 (×2): 5 mg via ORAL
  Filled 2013-11-29 (×2): qty 1

## 2013-11-29 MED ORDER — SODIUM CHLORIDE 0.9 % IJ SOLN
3.0000 mL | Freq: Two times a day (BID) | INTRAMUSCULAR | Status: DC
  Administered 2013-11-29 – 2013-12-04 (×12): 3 mL via INTRAVENOUS

## 2013-11-29 MED ORDER — IRBESARTAN 300 MG PO TABS
300.0000 mg | ORAL_TABLET | Freq: Every day | ORAL | Status: DC
  Administered 2013-11-29 – 2013-12-04 (×6): 300 mg via ORAL
  Filled 2013-11-29 (×6): qty 1

## 2013-11-29 MED ORDER — ALBUTEROL SULFATE (2.5 MG/3ML) 0.083% IN NEBU
5.0000 mg | INHALATION_SOLUTION | Freq: Once | RESPIRATORY_TRACT | Status: AC
  Administered 2013-11-29: 5 mg via RESPIRATORY_TRACT
  Filled 2013-11-29: qty 6

## 2013-11-29 MED ORDER — AMLODIPINE BESYLATE 10 MG PO TABS
10.0000 mg | ORAL_TABLET | Freq: Every day | ORAL | Status: DC
  Administered 2013-11-29 – 2013-12-04 (×4): 10 mg via ORAL
  Filled 2013-11-29 (×7): qty 1

## 2013-11-29 MED ORDER — ALBUTEROL SULFATE (2.5 MG/3ML) 0.083% IN NEBU
2.5000 mg | INHALATION_SOLUTION | Freq: Four times a day (QID) | RESPIRATORY_TRACT | Status: DC
  Administered 2013-11-29: 08:00:00 2.5 mg via RESPIRATORY_TRACT
  Filled 2013-11-29: qty 3

## 2013-11-29 MED ORDER — HYDROCHLOROTHIAZIDE 25 MG PO TABS
25.0000 mg | ORAL_TABLET | Freq: Every day | ORAL | Status: DC
  Administered 2013-11-29 – 2013-11-30 (×2): 25 mg via ORAL
  Filled 2013-11-29 (×2): qty 1

## 2013-11-29 MED ORDER — METFORMIN HCL 500 MG PO TABS
1000.0000 mg | ORAL_TABLET | Freq: Two times a day (BID) | ORAL | Status: DC
  Administered 2013-11-29 – 2013-12-04 (×12): 1000 mg via ORAL
  Filled 2013-11-29 (×13): qty 2

## 2013-11-29 MED ORDER — ACETAMINOPHEN 325 MG PO TABS: 650.0000 mg | ORAL_TABLET | Freq: Four times a day (QID) | ORAL | Status: DC | PRN

## 2013-11-29 MED ORDER — BUDESONIDE-FORMOTEROL FUMARATE 160-4.5 MCG/ACT IN AERO
1.0000 | INHALATION_SPRAY | Freq: Two times a day (BID) | RESPIRATORY_TRACT | Status: DC
  Administered 2013-11-29: 1 via RESPIRATORY_TRACT
  Filled 2013-11-29: qty 6

## 2013-11-29 MED ORDER — ONDANSETRON HCL 4 MG/2ML IJ SOLN: 4.0000 mg | Freq: Three times a day (TID) | INTRAMUSCULAR | Status: DC | PRN

## 2013-11-29 MED ORDER — IOHEXOL 350 MG/ML SOLN
100.0000 mL | Freq: Once | INTRAVENOUS | Status: AC | PRN
  Administered 2013-11-29: 100 mL via INTRAVENOUS

## 2013-11-29 MED ORDER — FUROSEMIDE 20 MG PO TABS
20.0000 mg | ORAL_TABLET | Freq: Every day | ORAL | Status: DC
  Administered 2013-11-29 – 2013-11-30 (×2): 20 mg via ORAL
  Filled 2013-11-29 (×2): qty 1

## 2013-11-29 MED ORDER — ONDANSETRON HCL 4 MG/2ML IJ SOLN: 4.0000 mg | Freq: Four times a day (QID) | INTRAMUSCULAR | Status: DC | PRN

## 2013-11-29 MED ORDER — BUDESONIDE 0.25 MG/2ML IN SUSP
0.2500 mg | Freq: Two times a day (BID) | RESPIRATORY_TRACT | Status: DC
  Administered 2013-11-29 – 2013-12-03 (×8): 0.25 mg via RESPIRATORY_TRACT
  Filled 2013-11-29 (×10): qty 2

## 2013-11-29 MED ORDER — OLMESARTAN-AMLODIPINE-HCTZ 40-10-25 MG PO TABS: 1.0000 | ORAL_TABLET | Freq: Every day | ORAL | Status: DC

## 2013-11-29 MED ORDER — IPRATROPIUM-ALBUTEROL 0.5-2.5 (3) MG/3ML IN SOLN
3.0000 mL | Freq: Four times a day (QID) | RESPIRATORY_TRACT | Status: DC
  Administered 2013-11-29 – 2013-11-30 (×4): 3 mL via RESPIRATORY_TRACT
  Filled 2013-11-29 (×4): qty 3

## 2013-11-29 MED ORDER — INSULIN DETEMIR 100 UNIT/ML ~~LOC~~ SOLN
8.0000 [IU] | Freq: Every day | SUBCUTANEOUS | Status: DC
  Administered 2013-11-29 – 2013-12-03 (×5): 8 [IU] via SUBCUTANEOUS
  Filled 2013-11-29 (×6): qty 0.08

## 2013-11-29 MED ORDER — ALBUTEROL SULFATE (2.5 MG/3ML) 0.083% IN NEBU
2.5000 mg | INHALATION_SOLUTION | RESPIRATORY_TRACT | Status: DC | PRN
  Administered 2013-12-02: 17:00:00 2.5 mg via RESPIRATORY_TRACT
  Filled 2013-11-29: qty 3

## 2013-11-29 MED ORDER — SIMVASTATIN 20 MG PO TABS
20.0000 mg | ORAL_TABLET | Freq: Every evening | ORAL | Status: DC
  Administered 2013-11-29 – 2013-12-04 (×6): 20 mg via ORAL
  Filled 2013-11-29 (×6): qty 1

## 2013-11-29 MED ORDER — INSULIN ASPART 100 UNIT/ML ~~LOC~~ SOLN
0.0000 [IU] | Freq: Three times a day (TID) | SUBCUTANEOUS | Status: DC
  Administered 2013-11-29 – 2013-11-30 (×4): 5 [IU] via SUBCUTANEOUS
  Administered 2013-11-30: 2 [IU] via SUBCUTANEOUS
  Administered 2013-11-30: 07:00:00 3 [IU] via SUBCUTANEOUS
  Administered 2013-12-01 (×2): 5 [IU] via SUBCUTANEOUS
  Administered 2013-12-01: 3 [IU] via SUBCUTANEOUS
  Administered 2013-12-02: 5 [IU] via SUBCUTANEOUS
  Administered 2013-12-02: 3 [IU] via SUBCUTANEOUS
  Administered 2013-12-02: 5 [IU] via SUBCUTANEOUS
  Administered 2013-12-03: 3 [IU] via SUBCUTANEOUS
  Administered 2013-12-03 (×2): 2 [IU] via SUBCUTANEOUS
  Administered 2013-12-04: 17:00:00 3 [IU] via SUBCUTANEOUS
  Administered 2013-12-04: 2 [IU] via SUBCUTANEOUS

## 2013-11-29 MED ORDER — METHYLPREDNISOLONE SODIUM SUCC 125 MG IJ SOLR
60.0000 mg | Freq: Four times a day (QID) | INTRAMUSCULAR | Status: DC
  Administered 2013-11-29 – 2013-12-02 (×14): 60 mg via INTRAVENOUS
  Filled 2013-11-29 (×17): qty 0.96

## 2013-11-29 MED ORDER — ACETAMINOPHEN 650 MG RE SUPP: 650.0000 mg | Freq: Four times a day (QID) | RECTAL | Status: DC | PRN

## 2013-11-29 MED ORDER — IPRATROPIUM BROMIDE 0.02 % IN SOLN
0.5000 mg | Freq: Four times a day (QID) | RESPIRATORY_TRACT | Status: DC
  Administered 2013-11-29: 08:00:00 0.5 mg via RESPIRATORY_TRACT
  Filled 2013-11-29: qty 2.5

## 2013-11-29 MED ORDER — IPRATROPIUM-ALBUTEROL 0.5-2.5 (3) MG/3ML IN SOLN: 3.0000 mL | RESPIRATORY_TRACT | Status: DC

## 2013-11-29 MED ORDER — ALBUTEROL SULFATE (2.5 MG/3ML) 0.083% IN NEBU: 2.5000 mg | INHALATION_SOLUTION | RESPIRATORY_TRACT | Status: DC

## 2013-11-29 MED ORDER — ONDANSETRON HCL 4 MG PO TABS: 4.0000 mg | ORAL_TABLET | Freq: Four times a day (QID) | ORAL | Status: DC | PRN

## 2013-11-29 MED ORDER — SITAGLIPTIN PHOS-METFORMIN HCL 50-1000 MG PO TABS: 1.0000 | ORAL_TABLET | Freq: Two times a day (BID) | ORAL | Status: DC

## 2013-11-29 NOTE — H&P (Signed)
[facility]                                                                                    Patient Demographics  Beth [name], is a 73 y.o. female  CSN: [id number]  MRN: [id number]  DOB - Jul 21, 1941  Admit Date - 11/28/2013  Outpatient Primary MD for the patient is Salena Saner., MD   With History of -  Past Medical History  Diagnosis Date  . Diabetes mellitus     type 2  . Hyperlipidemia   . Hypertension   . COPD (chronic obstructive pulmonary disease)     dR. wERT  . Shortness of breath     WITH EXERTION   . Asthma   . Sleep apnea     USES  CPAP   . Arthritis   . Stroke       Past Surgical History  Procedure Laterality Date  . Tubal ligation    . Cataract extraction w/phaco Right 04/19/2013    Procedure: CATARACT EXTRACTION PHACO AND INTRAOCULAR LENS PLACEMENT (IOC);  Surgeon: Adonis Brook, MD;  Location: [facility];  Service: Ophthalmology;  Laterality: Right;  . Eye surgery    . Cataract extraction w/phaco Left 05/03/2013    Procedure: CATARACT EXTRACTION PHACO AND INTRAOCULAR LENS PLACEMENT (IOC);  Surgeon: Adonis Brook, MD;  Location: [facility];  Service: Ophthalmology;  Laterality: Left;    in for   Chief Complaint  Patient presents with  . Shortness of Breath     HPI  Beth [name]  is a 73 y.o. female, with a history of COPD , diabetes mellitus , presenting to today with 3 days history of shortness of breath that is getting worse . She received Rocephin and steroids at an urgent care center for pneumonia however the shortness of breath or continued worsening. In the emergency room the patient was found to have a saturation of 84%, chest x-ray was negative and the blood cell count was not elevated. I was called to admit    Review of Systems    In addition to the HPI above,  No Fever-chills, No Headache, No changes with Vision or hearing, No problems swallowing food or Liquids, No Abdominal pain, No Nausea or Vommitting, Bowel  movements are regular, No Blood in stool or Urine, No dysuria, No new skin rashes or bruises, No new joints pains-aches,  No new weakness, tingling, numbness in any extremity, No recent weight gain or loss, No polyuria, polydypsia or polyphagia, No significant Mental Stressors.  A full 10 point Review of Systems was done, except as stated above, all other Review of Systems were negative.   Social History History  Substance Use Topics  . Smoking status: Former Smoker -- 1.50 packs/day for 40 years    Types: Cigarettes    Quit date: 11/16/2004  . Smokeless tobacco: Former Systems developer  . Alcohol Use: No     Comment: occassional     Family History Family History  Problem Relation Age of Onset  . Heart disease Mother   . Heart disease Brother   . Heart disease Brother   . Heart disease Sister   . Heart disease  Sister      Prior to Admission medications   Medication Sig Start Date End Date Taking? Authorizing Provider  albuterol (ACCUNEB) 0.63 MG/3ML nebulizer solution Take 1 ampule by nebulization every 6 (six) hours as needed for wheezing.    Yes Historical Provider, MD  albuterol (PROVENTIL HFA;VENTOLIN HFA) 108 (90 BASE) MCG/ACT inhaler Inhale 2 puffs into the lungs every 6 (six) hours as needed for wheezing.    Yes Historical Provider, MD  budesonide-formoterol (SYMBICORT) 160-4.5 MCG/ACT inhaler Inhale 1 puff into the lungs 2 (two) times daily. Take 2 puffs first thing in am and then another 2 puffs about 12 hours later. 12/26/12  Yes Tanda Rockers, MD  furosemide (LASIX) 20 MG tablet Take 1 tablet by mouth daily.   Yes Historical Provider, MD  simvastatin (ZOCOR) 20 MG tablet Take 20 mg by mouth every evening.   Yes Historical Provider, MD  sitaGLIPtan-metformin (JANUMET) 50-1000 MG per tablet Take 1 tablet by mouth 2 (two) times daily with a meal.   Yes Historical Provider, MD  TRIBENZOR 40-10-25 MG TABS Take 1 tablet by mouth daily.   Yes Historical Provider, MD     Allergies  Allergen Reactions  . Aldomet [Methyldopa] Hives    Physical Exam  Vitals  Blood pressure 135/54, pulse 87, temperature 97.9 F (36.6 C), temperature source Oral, resp. rate 17, height _0  (1.575 m), weight 79.833 kg (176 lb), SpO2 95.00%.   1. General elderly African American female looks tired  2. Normal affect and insight, Not Suicidal or Homicidal,  3. No F.N deficits, ALL C.Nerves Intact, Strength 5/5 all 4 extremities, Sensation intact all 4 extremities, Plantars down going.  4. Ears and Eyes appear Normal, Conjunctivae clear, PERRLA. Moist Oral Mucosa.  5. Supple Neck, No JVD, No cervical lymphadenopathy appriciated, No Carotid Bruits.  6. Symmetrical Chest wall movement, very tight chest with scattered rhonchi.  7. RRR, No Gallops, Rubs or Murmurs, No Parasternal Heave.  8. Positive Bowel Sounds, Abdomen Soft, Non tender, No organomegaly appriciated,No rebound -guarding or rigidity.  9.  No Cyanosis, Normal Skin Turgor, No Skin Rash or Bruise.  10. Good muscle tone,  joints appear normal , no effusions, Normal ROM.    Data Review  CBC  Recent Labs Lab 11/28/13 2303 11/28/13 2315  WBC 7.4  --   HGB 14.0 15.6*  HCT 42.1 46.0  PLT 231  --   MCV 93.6  --   MCH 31.1  --   MCHC 33.3  --   RDW 16.0*  --   LYMPHSABS 0.9  --   MONOABS 0.3  --   EOSABS 0.0  --   BASOSABS 0.0  --    ------------------------------------------------------------------------------------------------------------------  Chemistries   Recent Labs Lab 11/28/13 2315  NA 137  K 4.3  CL 99  GLUCOSE 205*  BUN 9  CREATININE 0.70   ------------------------------------------------------------------------------------------------------------------ estimated creatinine clearance is 62.2 ml/min (by C-G formula based on Cr of 0.7). ------------------------------------------------------------------------------------------------------------------ No results found for  this basename: TSH, T4TOTAL, FREET3, T3FREE, THYROIDAB,  in the last 72 hours    ---------------------------------------------------------------------------------------------------------------    ----------------------------------------------------------------------------------------------------------------  Imaging results:   Dg Chest 2 View  11/29/2013   CLINICAL DATA:  Cough.  Shortness of breath.  EXAM: CHEST  2 VIEW  COMPARISON:  05/31/2012, 10/06/2010, 07/11/2009.  FINDINGS: Cardiac silhouette mildly enlarged but stable. Thoracic aorta mildly atherosclerotic, unchanged. Hilar and mediastinal contours otherwise unremarkable. Prominent bronchovascular markings diffusely and moderate central peribronchial thickening, more so than  on the prior examinations. Stable mild hyperinflation. Lungs otherwise clear. No localized airspace consolidation. No pleural effusions. No pneumothorax. Normal pulmonary vascularity. Visualized bony thorax intact.  IMPRESSION: Moderate changes of acute bronchitis and/or asthma without localized airspace pneumonia. Stable mild cardiomegaly without pulmonary edema.   Electronically Signed   By: Evangeline Dakin M.D.   On: 11/29/2013 00:43   Ct Angio Chest Pe W/cm &/or Wo Cm  11/29/2013   CLINICAL DATA:  Chest pain.  Shortness of breath.  Tachycardia.  EXAM: CT ANGIOGRAPHY CHEST WITH CONTRAST  TECHNIQUE: Multidetector CT imaging of the chest was performed using the standard protocol during bolus administration of intravenous contrast. Multiplanar CT image reconstructions and MIPs were obtained to evaluate the vascular anatomy.  CONTRAST:  157m OMNIPAQUE IOHEXOL 350 MG/ML IV.  COMPARISON:  None.  FINDINGS: Contrast opacification of the pulmonary arteries is very good. No filling defects within either main pulmonary artery or their branches in either lung to suggest pulmonary embolism. Heart mildly enlarged with left ventricular predominance and mild left ventricular  hypertrophy. No significant pericardial effusion. Moderate to severe LAD coronary atherosclerosis. Moderate aortic valvular calcification. Moderate to severe atherosclerosis involving the thoracic upper abdominal aorta without aneurysm or dissection. Bovine aortic arch anatomy (left common carotid artery arises from the innominate artery).  Emphysematous changes throughout both lungs. Scar and/or atelectasis in the lingula and right middle lobe. Tiny peripheral blebs in the right upper lobe. Central airways patent with calcified tracheobronchial cartilages and moderate to severe central bronchial wall thickening. No localized airspace consolidation. No pulmonary parenchymal nodules or masses. No pleural effusions.  Normal and upper normal sized bilateral hilar lymph nodes, the largest in the right hilum approximating 1.5 x 1.7 cm, statistically reactive. No significant mediastinal or axillary lymphadenopathy. Visualized thyroid gland unremarkable.  Numerous simple cysts involving the visualized liver. Visualized upper abdomen otherwise unremarkable for the early arterial phase of enhancement. Bone window images unremarkable.  Review of the MIP images confirms the above findings.  IMPRESSION: 1. No evidence of pulmonary embolism. 2. COPD/emphysema. Minimal scar/atelectasis in the lingula and right middle lobe. Marked central peribronchial thickening consistent with bronchitis and/or asthma.   Electronically Signed   By: TEvangeline DakinM.D.   On: 11/29/2013 01:46     Assessment & Plan  1. COPD exacerbation/bronchitis     IV steroids     Nebulizer treatment     IV Levaquin     Oxygen 2. diabetes mellitus     Insulin sliding scale     Continue by mouth medications 3. History of diabetes mellitus hypertension hyperlipidemia and asthma   DVT Prophylaxis Lovenox  AM Labs Ordered, also please review Full Orders  Family Communication: Admission, patients condition and plan of care including tests being  ordered have been discussed with the patient and daughter who indicate understanding and agree with the plan and Code Status.  Code Status full  Disposition Plan: Home  Time spent in minutes : 32 minutes  Condition fair

## 2013-11-29 NOTE — Progress Notes (Signed)
Patient seen and examined. Admitted after midnight secondary to acute COPD exacerbation due to bronchitis/early PNA. Patient currently afebrile and breathing slightly better. Still with significant wheezing on exam.  Plan: -continue steroids, abx;s, pulmicort and nebulizer treatment -start flutter valve -SSI and levemir while on steroids -follow clinical response.  Beth [name] [phone]

## 2013-11-29 NOTE — Progress Notes (Signed)
Pt admitted to 3E12, from ER came by stretcher family member at the bedside. Pt AO x 4, denies any pain or discomfort pt is very SOB even at rest and have O2 sat low 85% on RA, pt placed on 4L O2 [place]. Pt and family member oriented to the unit and to her room, and encouraged to call for assistance to get OOB to prevent any fall or injury while in the floor. We'll continue with POC.

## 2013-11-29 NOTE — Progress Notes (Signed)
Patient evaluated for community based chronic disease management services with [facility] as a benefit of patient's [employer]. Spoke with patients daughter/ primary caregiver at bedside to explain [facility] services.  Daughter feels that they can manager her care at home currently but was very appreciative of service information.  Left contact information and [facility] literature at bedside. Made Inpatient Case Manager aware that [facility] evaluation was completed. Of note, [facility] Care Management services does not replace or interfere with any services that are arranged by inpatient case management or social work.  For additional questions or referrals please contact Corliss Blacker BSN RN [facility] Liaison at [phone].

## 2013-11-29 NOTE — Progress Notes (Signed)
Inpatient Diabetes Program Recommendations  AACE/ADA: New Consensus Statement on Inpatient Glycemic Control (2013)  Target Ranges:  Prepandial:   less than 140 mg/dL      Peak postprandial:   less than 180 mg/dL (1-2 hours)      Critically ill patients:  140 - 180 mg/dL   Reason for Visit: Results for Beth [name], Beth [name] (MRN [id number]) as of 11/29/2013 15:04  Ref. Range 11/29/2013 05:53 11/29/2013 11:23  Glucose-Capillary Latest Range: 70-99 mg/dL 213 (H) 203 (H)  Results for Beth [name], Beth [name] (MRN [id number]) as of 11/29/2013 15:04  Ref. Range 11/29/2013 03:50  Hemoglobin A1C Latest Range: <5.7 % 6.8 (H)   Note elevated CBG's.  Patient is on Solumedrol 60 mg IV q 6 hours which is likely contributing to elevation.  May consider adding NPH 10 units q AM while on steroids.  A1C indicates good control of CBG's prior to admission.   Thanks, Adah Perl, RN, BC-ADM Inpatient Diabetes Coordinator Pager [phone]

## 2013-11-30 LAB — LEGIONELLA ANTIGEN, URINE: Legionella Antigen, Urine: NEGATIVE

## 2013-11-30 LAB — GLUCOSE, CAPILLARY
GLUCOSE-CAPILLARY: 244 mg/dL — AB (ref 70–99)
GLUCOSE-CAPILLARY: 141 mg/dL — AB (ref 70–99)
Glucose-Capillary: 199 mg/dL — ABNORMAL HIGH (ref 70–99)
Glucose-Capillary: 199 mg/dL — ABNORMAL HIGH (ref 70–99)

## 2013-11-30 MED ORDER — IPRATROPIUM-ALBUTEROL 0.5-2.5 (3) MG/3ML IN SOLN
3.0000 mL | Freq: Three times a day (TID) | RESPIRATORY_TRACT | Status: DC
  Administered 2013-11-30 – 2013-12-03 (×10): 3 mL via RESPIRATORY_TRACT
  Filled 2013-11-30 (×9): qty 3

## 2013-11-30 MED ORDER — INSULIN ASPART 100 UNIT/ML ~~LOC~~ SOLN
4.0000 [IU] | Freq: Three times a day (TID) | SUBCUTANEOUS | Status: DC
  Administered 2013-12-01 (×2): 4 [IU] via SUBCUTANEOUS
  Administered 2013-12-01 – 2013-12-02 (×2): via SUBCUTANEOUS
  Administered 2013-12-02 – 2013-12-04 (×5): 4 [IU] via SUBCUTANEOUS

## 2013-11-30 MED ORDER — FUROSEMIDE 40 MG PO TABS
40.0000 mg | ORAL_TABLET | Freq: Every day | ORAL | Status: DC
  Administered 2013-12-01 – 2013-12-04 (×4): 40 mg via ORAL
  Filled 2013-11-30 (×4): qty 1

## 2013-11-30 NOTE — Progress Notes (Signed)
The patient still became SOB when ambulating overnight.  Audible wheezes could be heard at the bedside when she became SOB.  She did not have any complaints of pain and said that she is doing okay this morning.

## 2013-11-30 NOTE — Progress Notes (Signed)
Inpatient Diabetes Program Recommendations  AACE/ADA: New Consensus Statement on Inpatient Glycemic Control (2013)  Target Ranges:  Prepandial:   less than 140 mg/dL      Peak postprandial:   less than 180 mg/dL (1-2 hours)      Critically ill patients:  140 - 180 mg/dL   Inpatient Diabetes Program Recommendations Insulin - Meal Coverage: consider adding Novolog 4 units TID with meals for elevated postprandial CBGs (secondary to steroid therapy) Thank you  Raoul Pitch BSN, RN,CDE Inpatient Diabetes Coordinator [phone] (team pager)

## 2013-11-30 NOTE — Progress Notes (Signed)
[facility] PROGRESS NOTE  Beth [name] FMB:[id number] DOB: [birth year] DOA: 11/28/2013 PCP: Salena Saner., MD  Assessment/Plan: 1-acute resp failure with hypoxia: due to COPD exacerbation. -still SOB and wheezing; but improving. -continue antibiotics, solumedrol, pulmicort and nebulizer treatment. -PRN oxygen supplementation   2-HTN: stable. Will continue home regimen. Except for stopping thiazide and adjust lasix for patient to be in just 1 diuretic. -continue heart healthy diet  3-DM: contiue SSI, levemir and linagliptin/metformin. -due to steroids and ris in CBG's around meals will add meal coverage.  4-HLD:cntinue satins  5-DVT: lovenox  Code Status: Full Family Communication: no family at bedside Disposition Plan: home when medically stable.   Consultants:  None   Procedures:  See below for x-ray reports  Antibiotics:  Levaquin 10/14  HPI/Subjective: Afebrile, still with some SOB. Overall feeling better.  Objective: Filed Vitals:   11/30/13 2100  BP: 99/46  Pulse: 94  Temp: 98.2 F (36.8 C)  Resp: 18    Intake/Output Summary (Last 24 hours) at 11/30/13 2328 Last data filed at 11/30/13 2100  Gross per 24 hour  Intake    680 ml  Output   1326 ml  Net   -646 ml   Filed Weights   11/28/13 2132 11/29/13 0337 11/30/13 0600  Weight: 79.833 kg (176 lb) 79.9 kg (176 lb 2.4 oz) 79.8 kg (175 lb 14.8 oz)    Exam:   General:  Afebrile, still with some SOB, but better ovearall   Cardiovascular: S1 and S2, o rubs or gallops  Respiratory: positive exp wheezing, scattered rhonchi and some fine crackles at bases  Abdomen: soft, NT, ND, positive BS  Musculoskeletal: no joint swelling, FROM.  Data Reviewed: Basic Metabolic Panel:  Recent Labs Lab 11/28/13 2315 11/29/13 0350  NA 137  --   K 4.3  --   CL 99  --   GLUCOSE 205*  --   BUN 9  --   CREATININE 0.70 0.67   CBC:  Recent Labs Lab 11/28/13 2303 11/28/13 2315  11/29/13 0350  WBC 7.4  --  6.7  NEUTROABS 6.3  --   --   HGB 14.0 15.6* 13.8  HCT 42.1 46.0 42.5  MCV 93.6  --  92.4  PLT 231  --  244   CBG:  Recent Labs Lab 11/29/13 2123 11/30/13 0625 11/30/13 1038 11/30/13 1625 11/30/13 2102  GLUCAP 176* 199* 244* 141* 199*    Recent Results (from the past 240 hour(s))  CULTURE, BLOOD (ROUTINE X 2)     Status: None   Collection Time    11/29/13  3:50 AM      Result Value Range Status   Specimen Description BLOOD RIGHT HAND   Final   Special Requests BOTTLES DRAWN AEROBIC ONLY [facility]   Final   Culture  Setup Time     Final   Value: 11/29/2013 08:43     Performed at [facility]   Culture     Final   Value:        BLOOD CULTURE RECEIVED NO GROWTH TO DATE CULTURE WILL BE HELD FOR 5 DAYS BEFORE ISSUING A FINAL NEGATIVE REPORT     Performed at [facility]   Report Status PENDING   Incomplete  CULTURE, BLOOD (ROUTINE X 2)     Status: None   Collection Time    11/29/13  3:55 AM      Result Value Range Status   Specimen Description BLOOD LEFT HAND   Final  Special Requests     Final   Value: BOTTLES DRAWN AEROBIC AND ANAEROBIC 10CC BLUE,5CC RED   Culture  Setup Time     Final   Value: 11/29/2013 08:44     Performed at [facility]   Culture     Final   Value:        BLOOD CULTURE RECEIVED NO GROWTH TO DATE CULTURE WILL BE HELD FOR 5 DAYS BEFORE ISSUING A FINAL NEGATIVE REPORT     Performed at [facility]   Report Status PENDING   Incomplete     Studies: Dg Chest 2 View  11/29/2013   CLINICAL DATA:  Cough.  Shortness of breath.  EXAM: CHEST  2 VIEW  COMPARISON:  05/31/2012, 10/06/2010, 07/11/2009.  FINDINGS: Cardiac silhouette mildly enlarged but stable. Thoracic aorta mildly atherosclerotic, unchanged. Hilar and mediastinal contours otherwise unremarkable. Prominent bronchovascular markings diffusely and moderate central peribronchial thickening, more so than on the prior examinations. Stable mild  hyperinflation. Lungs otherwise clear. No localized airspace consolidation. No pleural effusions. No pneumothorax. Normal pulmonary vascularity. Visualized bony thorax intact.  IMPRESSION: Moderate changes of acute bronchitis and/or asthma without localized airspace pneumonia. Stable mild cardiomegaly without pulmonary edema.   Electronically Signed   By: Evangeline Dakin M.D.   On: 11/29/2013 00:43   Ct Angio Chest Pe W/cm &/or Wo Cm  11/29/2013   CLINICAL DATA:  Chest pain.  Shortness of breath.  Tachycardia.  EXAM: CT ANGIOGRAPHY CHEST WITH CONTRAST  TECHNIQUE: Multidetector CT imaging of the chest was performed using the standard protocol during bolus administration of intravenous contrast. Multiplanar CT image reconstructions and MIPs were obtained to evaluate the vascular anatomy.  CONTRAST:  165m OMNIPAQUE IOHEXOL 350 MG/ML IV.  COMPARISON:  None.  FINDINGS: Contrast opacification of the pulmonary arteries is very good. No filling defects within either main pulmonary artery or their branches in either lung to suggest pulmonary embolism. Heart mildly enlarged with left ventricular predominance and mild left ventricular hypertrophy. No significant pericardial effusion. Moderate to severe LAD coronary atherosclerosis. Moderate aortic valvular calcification. Moderate to severe atherosclerosis involving the thoracic upper abdominal aorta without aneurysm or dissection. Bovine aortic arch anatomy (left common carotid artery arises from the innominate artery).  Emphysematous changes throughout both lungs. Scar and/or atelectasis in the lingula and right middle lobe. Tiny peripheral blebs in the right upper lobe. Central airways patent with calcified tracheobronchial cartilages and moderate to severe central bronchial wall thickening. No localized airspace consolidation. No pulmonary parenchymal nodules or masses. No pleural effusions.  Normal and upper normal sized bilateral hilar lymph nodes, the largest in the  right hilum approximating 1.5 x 1.7 cm, statistically reactive. No significant mediastinal or axillary lymphadenopathy. Visualized thyroid gland unremarkable.  Numerous simple cysts involving the visualized liver. Visualized upper abdomen otherwise unremarkable for the early arterial phase of enhancement. Bone window images unremarkable.  Review of the MIP images confirms the above findings.  IMPRESSION: 1. No evidence of pulmonary embolism. 2. COPD/emphysema. Minimal scar/atelectasis in the lingula and right middle lobe. Marked central peribronchial thickening consistent with bronchitis and/or asthma.   Electronically Signed   By: TEvangeline DakinM.D.   On: 11/29/2013 01:46    Scheduled Meds: . irbesartan  300 mg Oral Daily   And  . amLODipine  10 mg Oral Daily  . budesonide (PULMICORT) nebulizer solution  0.25 mg Nebulization BID  . enoxaparin (LOVENOX) injection  40 mg Subcutaneous Daily  . [START ON 12/01/2013] furosemide  40  mg Oral Daily  . insulin aspart  0-15 Units Subcutaneous TID WC  . [START ON 12/01/2013] insulin aspart  4 Units Subcutaneous TID WC  . insulin detemir  8 Units Subcutaneous QHS  . ipratropium-albuterol  3 mL Nebulization TID  . levofloxacin (LEVAQUIN) IV  500 mg Intravenous Daily  . linagliptin  5 mg Oral Q breakfast   And  . metFORMIN  1,000 mg Oral BID WC  . methylPREDNISolone (SOLU-MEDROL) injection  60 mg Intravenous Q6H  . simvastatin  20 mg Oral QPM  . sodium chloride  3 mL Intravenous Q12H    Time spent: > 30 minutes   Sahan Pen  [facility] Pager [phone]. If 7PM-7AM, please contact night-coverage at [email or web address], password [facility] 11/30/2013, 11:28 PM  LOS: 2 days

## 2013-12-01 DIAGNOSIS — J209 Acute bronchitis, unspecified: Secondary | ICD-10-CM

## 2013-12-01 DIAGNOSIS — J441 Chronic obstructive pulmonary disease with (acute) exacerbation: Secondary | ICD-10-CM

## 2013-12-01 DIAGNOSIS — R0609 Other forms of dyspnea: Secondary | ICD-10-CM

## 2013-12-01 DIAGNOSIS — E1310 Other specified diabetes mellitus with ketoacidosis without coma: Secondary | ICD-10-CM

## 2013-12-01 DIAGNOSIS — R0989 Other specified symptoms and signs involving the circulatory and respiratory systems: Secondary | ICD-10-CM

## 2013-12-01 DIAGNOSIS — I10 Essential (primary) hypertension: Secondary | ICD-10-CM

## 2013-12-01 LAB — GLUCOSE, CAPILLARY
GLUCOSE-CAPILLARY: 233 mg/dL — AB (ref 70–99)
GLUCOSE-CAPILLARY: 157 mg/dL — AB (ref 70–99)
Glucose-Capillary: 204 mg/dL — ABNORMAL HIGH (ref 70–99)
Glucose-Capillary: 191 mg/dL — ABNORMAL HIGH (ref 70–99)

## 2013-12-01 LAB — EXPECTORATED SPUTUM ASSESSMENT W REFEX TO RESP CULTURE

## 2013-12-01 LAB — BASIC METABOLIC PANEL
BUN: 43 mg/dL — ABNORMAL HIGH (ref 6–23)
CHLORIDE: 96 meq/L (ref 96–112)
CO2: 28 meq/L (ref 19–32)
Calcium: 9.3 mg/dL (ref 8.4–10.5)
Creatinine, Ser: 0.93 mg/dL (ref 0.50–1.10)
GFR calc Af Amer: 69 mL/min — ABNORMAL LOW (ref >90)
GFR calc non Af Amer: 60 mL/min — ABNORMAL LOW (ref >90)
Glucose, Bld: 206 mg/dL — ABNORMAL HIGH (ref 70–99)
Potassium: 4.9 mEq/L (ref 3.7–5.3)
Sodium: 137 mEq/L (ref 137–147)

## 2013-12-01 LAB — EXPECTORATED SPUTUM ASSESSMENT W GRAM STAIN, RFLX TO RESP C

## 2013-12-01 MED ORDER — HYDROCOD POLST-CHLORPHEN POLST 10-8 MG/5ML PO LQCR
5.0000 mL | Freq: Three times a day (TID) | ORAL | Status: DC | PRN
  Administered 2013-12-01 – 2013-12-03 (×2): 5 mL via ORAL
  Filled 2013-12-01 (×4): qty 5

## 2013-12-01 NOTE — Progress Notes (Signed)
[facility] PROGRESS NOTE  Beth [name] [id number]:[id number] DOB: August 07, 1941 DOA: 11/28/2013 PCP: Salena Saner., MD  Assessment/Plan: 1-acute resp failure with hypoxia: due to COPD exacerbation. -slowly improving; but still SOB and wheezing; also with significant coughing spells. -continue antibiotics, solumedrol, pulmicort and nebulizer treatment. -PRN oxygen supplementation, with steadily/gradually weaning as tolerated -start PRN antitussives   2-HTN: stable. Will continue current antihypertensive regimen. -continue heart healthy diet  3-DM: contiue SSI, levemir and linagliptin/metformin. -due to steroids and ris in CBG's around meals will add meal coverage.  4-HLD:cntinue satins  5-DVT: lovenox  Code Status: Full Family Communication: no family at bedside Disposition Plan: home when medically stable.   Consultants:  None   Procedures:  See below for x-ray reports  Antibiotics:  Levaquin 10/14  HPI/Subjective: Afebrile, still with some SOB, wheezing and couching spells.  Objective: Filed Vitals:   12/01/13 1325  BP: 91/50  Pulse: 93  Temp: 97.6 F (36.4 C)  Resp: 18    Intake/Output Summary (Last 24 hours) at 12/01/13 1635 Last data filed at 12/01/13 1300  Gross per 24 hour  Intake    820 ml  Output   1677 ml  Net   -857 ml   Filed Weights   11/29/13 0337 11/30/13 0600 12/01/13 0505  Weight: 79.9 kg (176 lb 2.4 oz) 79.8 kg (175 lb 14.8 oz) 79.017 kg (174 lb 3.2 oz)    Exam:   General:  Afebrile, still with some SOB, diffuse wheezing and couching spells.  Cardiovascular: S1 and S2, o rubs or gallops  Respiratory: positive exp wheezing, scattered rhonchi and some fine crackles at bases  Abdomen: soft, NT, ND, positive BS  Musculoskeletal: no joint swelling, FROM.  Data Reviewed: Basic Metabolic Panel:  Recent Labs Lab 11/28/13 2315 11/29/13 0350 12/01/13 0520  NA 137  --  137  K 4.3  --  4.9  CL 99  --  96  CO2  --   --  28   GLUCOSE 205*  --  206*  BUN 9  --  43*  CREATININE 0.70 0.67 0.93  CALCIUM  --   --  9.3   CBC:  Recent Labs Lab 11/28/13 2303 11/28/13 2315 11/29/13 0350  WBC 7.4  --  6.7  NEUTROABS 6.3  --   --   HGB 14.0 15.6* 13.8  HCT 42.1 46.0 42.5  MCV 93.6  --  92.4  PLT 231  --  244   CBG:  Recent Labs Lab 11/30/13 1625 11/30/13 2102 12/01/13 0609 12/01/13 1112 12/01/13 1613  GLUCAP 141* 199* 204* 233* 157*    Recent Results (from the past 240 hour(s))  CULTURE, BLOOD (ROUTINE X 2)     Status: None   Collection Time    11/29/13  3:50 AM      Result Value Range Status   Specimen Description BLOOD RIGHT HAND   Final   Special Requests BOTTLES DRAWN AEROBIC ONLY [facility]   Final   Culture  Setup Time     Final   Value: 11/29/2013 08:43     Performed at [facility]   Culture     Final   Value:        BLOOD CULTURE RECEIVED NO GROWTH TO DATE CULTURE WILL BE HELD FOR 5 DAYS BEFORE ISSUING A FINAL NEGATIVE REPORT     Performed at [facility]   Report Status PENDING   Incomplete  CULTURE, BLOOD (ROUTINE X 2)     Status: None  Collection Time    11/29/13  3:55 AM      Result Value Range Status   Specimen Description BLOOD LEFT HAND   Final   Special Requests     Final   Value: BOTTLES DRAWN AEROBIC AND ANAEROBIC 10CC BLUE,5CC RED   Culture  Setup Time     Final   Value: 11/29/2013 08:44     Performed at [facility]   Culture     Final   Value:        BLOOD CULTURE RECEIVED NO GROWTH TO DATE CULTURE WILL BE HELD FOR 5 DAYS BEFORE ISSUING A FINAL NEGATIVE REPORT     Performed at [facility]   Report Status PENDING   Incomplete  CULTURE, EXPECTORATED SPUTUM-ASSESSMENT     Status: None   Collection Time    12/01/13  2:06 PM      Result Value Range Status   Specimen Description SPUTUM   Final   Special Requests NONE   Final   Sputum evaluation     Final   Value: MICROSCOPIC FINDINGS SUGGEST THAT THIS SPECIMEN IS NOT REPRESENTATIVE OF  LOWER RESPIRATORY SECRETIONS. PLEASE RECOLLECT.     Stephani Police RN [id number] 12/01/13 A BROWNING   Report Status 12/01/2013 FINAL   Final     Studies: No results found.  Scheduled Meds: . irbesartan  300 mg Oral Daily   And  . amLODipine  10 mg Oral Daily  . budesonide (PULMICORT) nebulizer solution  0.25 mg Nebulization BID  . enoxaparin (LOVENOX) injection  40 mg Subcutaneous Daily  . furosemide  40 mg Oral Daily  . insulin aspart  0-15 Units Subcutaneous TID WC  . insulin aspart  4 Units Subcutaneous TID WC  . insulin detemir  8 Units Subcutaneous QHS  . ipratropium-albuterol  3 mL Nebulization TID  . levofloxacin (LEVAQUIN) IV  500 mg Intravenous Daily  . linagliptin  5 mg Oral Q breakfast   And  . metFORMIN  1,000 mg Oral BID WC  . methylPREDNISolone (SOLU-MEDROL) injection  60 mg Intravenous Q6H  . simvastatin  20 mg Oral QPM  . sodium chloride  3 mL Intravenous Q12H    Time spent: < 30 minutes   Beth [name]  [facility] Pager [phone]. If 7PM-7AM, please contact night-coverage at [email or web address], password [facility] 12/01/2013, 4:35 PM  LOS: 3 days

## 2013-12-02 LAB — EXPECTORATED SPUTUM ASSESSMENT W REFEX TO RESP CULTURE

## 2013-12-02 LAB — GLUCOSE, CAPILLARY
GLUCOSE-CAPILLARY: 207 mg/dL — AB (ref 70–99)
GLUCOSE-CAPILLARY: 209 mg/dL — AB (ref 70–99)
Glucose-Capillary: 177 mg/dL — ABNORMAL HIGH (ref 70–99)
Glucose-Capillary: 270 mg/dL — ABNORMAL HIGH (ref 70–99)

## 2013-12-02 LAB — EXPECTORATED SPUTUM ASSESSMENT W GRAM STAIN, RFLX TO RESP C

## 2013-12-02 MED ORDER — LEVOFLOXACIN 750 MG PO TABS
750.0000 mg | ORAL_TABLET | Freq: Every day | ORAL | Status: DC
Start: 2013-12-03 — End: 2013-12-04
  Administered 2013-12-03 – 2013-12-04 (×2): 750 mg via ORAL
  Filled 2013-12-02 (×2): qty 1

## 2013-12-02 MED ORDER — PREDNISONE 50 MG PO TABS
60.0000 mg | ORAL_TABLET | Freq: Every day | ORAL | Status: DC
  Administered 2013-12-03: 60 mg via ORAL
  Filled 2013-12-02 (×2): qty 1

## 2013-12-02 MED ORDER — LORATADINE 10 MG PO TABS
10.0000 mg | ORAL_TABLET | Freq: Every day | ORAL | Status: DC
  Administered 2013-12-02 – 2013-12-04 (×3): 10 mg via ORAL
  Filled 2013-12-02 (×3): qty 1

## 2013-12-02 NOTE — Progress Notes (Signed)
[facility] PROGRESS NOTE  Beth [name] [id number]:[id number] DOB: 03/16/1941 DOA: 11/28/2013 PCP: Salena Saner., MD  Assessment/Plan: 1-acute resp failure with hypoxia: due to COPD exacerbation. -slowly improving; but still SOB and wheezing; also with significant coughing spells. -continue antibiotics, solumedrol, pulmicort and nebulizer treatment. -PRN oxygen supplementation, with steadily/gradually weaning as tolerated -continue PRN antitussives   2-HTN: stable. Will continue current antihypertensive regimen. -continue heart healthy diet  3-DM: contiue SSI, levemir and linagliptin/metformin. Continue meal coverage -CBG's better now -start tapering steroids  4-HLD:continue satins  5-DVT: lovenox  Code Status: Full Family Communication: no family at bedside Disposition Plan: home when medically stable.   Consultants:  None   Procedures:  See below for x-ray reports  Antibiotics:  Levaquin 10/14  HPI/Subjective: Afebrile, still with some SOB and wheezing. Improved on exam. Cough better with tussionex  Objective: Filed Vitals:   12/02/13 1347  BP: 123/46  Pulse: 98  Temp: 98 F (36.7 C)  Resp: 20    Intake/Output Summary (Last 24 hours) at 12/02/13 1743 Last data filed at 12/02/13 1322  Gross per 24 hour  Intake    480 ml  Output    701 ml  Net   -221 ml   Filed Weights   11/30/13 0600 12/01/13 0505 12/02/13 0627  Weight: 79.8 kg (175 lb 14.8 oz) 79.017 kg (174 lb 3.2 oz) 79.2 kg (174 lb 9.7 oz)    Exam:   General:  Afebrile, still with some SOB, positive exp wheezing.  Cardiovascular: S1 and S2, o rubs or gallops  Respiratory: positive exp wheezing (less today), scattered rhonchi and no crackles at bases  Abdomen: soft, NT, ND, positive BS  Musculoskeletal: no joint swelling, FROM.  Data Reviewed: Basic Metabolic Panel:  Recent Labs Lab 11/28/13 2315 11/29/13 0350 12/01/13 0520  NA 137  --  137  K 4.3  --  4.9  CL 99  --  96   CO2  --   --  28  GLUCOSE 205*  --  206*  BUN 9  --  43*  CREATININE 0.70 0.67 0.93  CALCIUM  --   --  9.3   CBC:  Recent Labs Lab 11/28/13 2303 11/28/13 2315 11/29/13 0350  WBC 7.4  --  6.7  NEUTROABS 6.3  --   --   HGB 14.0 15.6* 13.8  HCT 42.1 46.0 42.5  MCV 93.6  --  92.4  PLT 231  --  244   CBG:  Recent Labs Lab 12/01/13 1613 12/01/13 2111 12/02/13 0625 12/02/13 1114 12/02/13 1609  GLUCAP 157* 191* 207* 209* 177*    Recent Results (from the past 240 hour(s))  CULTURE, BLOOD (ROUTINE X 2)     Status: None   Collection Time    11/29/13  3:50 AM      Result Value Range Status   Specimen Description BLOOD RIGHT HAND   Final   Special Requests BOTTLES DRAWN AEROBIC ONLY [facility]   Final   Culture  Setup Time     Final   Value: 11/29/2013 08:43     Performed at [facility]   Culture     Final   Value:        BLOOD CULTURE RECEIVED NO GROWTH TO DATE CULTURE WILL BE HELD FOR 5 DAYS BEFORE ISSUING A FINAL NEGATIVE REPORT     Performed at [facility]   Report Status PENDING   Incomplete  CULTURE, BLOOD (ROUTINE X 2)     Status: None  Collection Time    11/29/13  3:55 AM      Result Value Range Status   Specimen Description BLOOD LEFT HAND   Final   Special Requests     Final   Value: BOTTLES DRAWN AEROBIC AND ANAEROBIC 10CC BLUE,5CC RED   Culture  Setup Time     Final   Value: 11/29/2013 08:44     Performed at [facility]   Culture     Final   Value:        BLOOD CULTURE RECEIVED NO GROWTH TO DATE CULTURE WILL BE HELD FOR 5 DAYS BEFORE ISSUING A FINAL NEGATIVE REPORT     Performed at [facility]   Report Status PENDING   Incomplete  CULTURE, EXPECTORATED SPUTUM-ASSESSMENT     Status: None   Collection Time    12/01/13  2:06 PM      Result Value Range Status   Specimen Description SPUTUM   Final   Special Requests NONE   Final   Sputum evaluation     Final   Value: MICROSCOPIC FINDINGS SUGGEST THAT THIS SPECIMEN IS NOT  REPRESENTATIVE OF LOWER RESPIRATORY SECRETIONS. PLEASE RECOLLECT.     Stephani Police RN [phone] 12/01/13 A BROWNING   Report Status 12/01/2013 FINAL   Final     Studies: No results found.  Scheduled Meds: . irbesartan  300 mg Oral Daily   And  . amLODipine  10 mg Oral Daily  . budesonide (PULMICORT) nebulizer solution  0.25 mg Nebulization BID  . enoxaparin (LOVENOX) injection  40 mg Subcutaneous Daily  . furosemide  40 mg Oral Daily  . insulin aspart  0-15 Units Subcutaneous TID WC  . insulin aspart  4 Units Subcutaneous TID WC  . insulin detemir  8 Units Subcutaneous QHS  . ipratropium-albuterol  3 mL Nebulization TID  . [START ON 12/03/2013] levofloxacin  750 mg Oral Daily  . linagliptin  5 mg Oral Q breakfast   And  . metFORMIN  1,000 mg Oral BID WC  . [START ON 12/03/2013] predniSONE  60 mg Oral Q breakfast  . simvastatin  20 mg Oral QPM  . sodium chloride  3 mL Intravenous Q12H    Time spent: < 30 minutes   Bo Teicher  [facility] Pager [phone]. If 7PM-7AM, please contact night-coverage at [email or web address], password [facility] 12/02/2013, 5:43 PM  LOS: 4 days

## 2013-12-02 NOTE — Progress Notes (Signed)
Pt weaned down to 1L [place] with sats at 95%. Before we were able to ambulate,pt began coughing with 02 sats down to 87%. Able to cough up sputum, sample sent to lab

## 2013-12-03 LAB — GLUCOSE, CAPILLARY
GLUCOSE-CAPILLARY: 148 mg/dL — AB (ref 70–99)
GLUCOSE-CAPILLARY: 138 mg/dL — AB (ref 70–99)
Glucose-Capillary: 138 mg/dL — ABNORMAL HIGH (ref 70–99)
Glucose-Capillary: 190 mg/dL — ABNORMAL HIGH (ref 70–99)

## 2013-12-03 MED ORDER — PREDNISONE 50 MG PO TABS
50.0000 mg | ORAL_TABLET | Freq: Every day | ORAL | Status: DC
  Administered 2013-12-04: 50 mg via ORAL
  Filled 2013-12-03 (×2): qty 1

## 2013-12-03 MED ORDER — ALBUTEROL SULFATE (2.5 MG/3ML) 0.083% IN NEBU: 2.5000 mg | INHALATION_SOLUTION | Freq: Four times a day (QID) | RESPIRATORY_TRACT | Status: DC | PRN

## 2013-12-03 MED ORDER — BUDESONIDE 0.5 MG/2ML IN SUSP
0.5000 mg | Freq: Two times a day (BID) | RESPIRATORY_TRACT | Status: DC
  Administered 2013-12-03 – 2013-12-04 (×2): 0.5 mg via RESPIRATORY_TRACT
  Filled 2013-12-03 (×4): qty 2

## 2013-12-03 MED ORDER — HYDROCOD POLST-CHLORPHEN POLST 10-8 MG/5ML PO LQCR
5.0000 mL | Freq: Four times a day (QID) | ORAL | Status: DC | PRN
  Administered 2013-12-03: 5 mL via ORAL

## 2013-12-03 MED ORDER — IPRATROPIUM-ALBUTEROL 0.5-2.5 (3) MG/3ML IN SOLN
3.0000 mL | Freq: Four times a day (QID) | RESPIRATORY_TRACT | Status: DC
  Administered 2013-12-03 – 2013-12-04 (×4): 3 mL via RESPIRATORY_TRACT
  Filled 2013-12-03 (×5): qty 3

## 2013-12-03 MED ORDER — HYDROCOD POLST-CHLORPHEN POLST 10-8 MG/5ML PO LQCR
5.0000 mL | Freq: Three times a day (TID) | ORAL | Status: DC | PRN
Start: 2013-12-03 — End: 2013-12-04

## 2013-12-03 MED ORDER — BENZONATATE 100 MG PO CAPS
100.0000 mg | ORAL_CAPSULE | Freq: Three times a day (TID) | ORAL | Status: DC | PRN
  Filled 2013-12-03: qty 1

## 2013-12-03 NOTE — Progress Notes (Signed)
[facility] PROGRESS NOTE  Beth [name] [id number] DOB: [birth year] DOA: 11/28/2013 PCP: Salena Saner., MD  Assessment/Plan: 1-acute resp failure with hypoxia: due to COPD exacerbation. -slowly improving; patient still with significant SOB and wheezing; also with ongoing coughing spells. -continue antibiotics, steroids, pulmicort and nebulizer treatments. -PRN oxygen supplementation, with steadily/gradually weaning as tolerated -will evaluate home oxygen needs -continue PRN antitussives  -repeat CXR in am  2-HTN: stable. Will continue current antihypertensive regimen. -continue heart healthy diet  3-DM: contiue SSI, levemir and linagliptin/metformin. Continue meal coverage -CBG's better now -continue tapering steroids, this will help controlling CBG's  4-HLD:continue satins  5-DVT: lovenox  Code Status: Full Family Communication: no family at bedside Disposition Plan: home when medically stable.   Consultants:  None   Procedures:  See below for x-ray reports  Antibiotics:  Levaquin 10/14  HPI/Subjective: Afebrile, still with SOB (minimal exertion), coughing spells and significant wheezing today.  Objective: Filed Vitals:   12/03/13 1400  BP: 116/72  Pulse: 99  Temp: 98 F (36.7 C)  Resp: 20    Intake/Output Summary (Last 24 hours) at 12/03/13 1842 Last data filed at 12/03/13 0855  Gross per 24 hour  Intake    603 ml  Output    700 ml  Net    -97 ml   Filed Weights   12/01/13 0505 12/02/13 0627 12/03/13 0635  Weight: 79.017 kg (174 lb 3.2 oz) 79.2 kg (174 lb 9.7 oz) 79.379 kg (175 lb)    Exam:   General:  Afebrile, still with some SOB, positive exp wheezing.  Cardiovascular: S1 and S2, o rubs or gallops  Respiratory: positive exp wheezing (> today), scattered rhonchi and no crackles at bases  Abdomen: soft, NT, ND, positive BS  Musculoskeletal: no joint swelling, FROM.  Data Reviewed: Basic Metabolic Panel:  Recent  Labs Lab 11/28/13 2315 11/29/13 0350 12/01/13 0520  NA 137  --  137  K 4.3  --  4.9  CL 99  --  96  CO2  --   --  28  GLUCOSE 205*  --  206*  BUN 9  --  43*  CREATININE 0.70 0.67 0.93  CALCIUM  --   --  9.3   CBC:  Recent Labs Lab 11/28/13 2303 11/28/13 2315 11/29/13 0350  WBC 7.4  --  6.7  NEUTROABS 6.3  --   --   HGB 14.0 15.6* 13.8  HCT 42.1 46.0 42.5  MCV 93.6  --  92.4  PLT 231  --  244   CBG:  Recent Labs Lab 12/02/13 1609 12/02/13 2114 12/03/13 0553 12/03/13 1122 12/03/13 1600  GLUCAP 177* 270* 138* 148* 190*    Recent Results (from the past 240 hour(s))  CULTURE, BLOOD (ROUTINE X 2)     Status: None   Collection Time    11/29/13  3:50 AM      Result Value Range Status   Specimen Description BLOOD RIGHT HAND   Final   Special Requests BOTTLES DRAWN AEROBIC ONLY [facility]   Final   Culture  Setup Time     Final   Value: 11/29/2013 08:43     Performed at [facility]   Culture     Final   Value:        BLOOD CULTURE RECEIVED NO GROWTH TO DATE CULTURE WILL BE HELD FOR 5 DAYS BEFORE ISSUING A FINAL NEGATIVE REPORT     Performed at [facility]   Report Status PENDING  Incomplete  CULTURE, BLOOD (ROUTINE X 2)     Status: None   Collection Time    11/29/13  3:55 AM      Result Value Range Status   Specimen Description BLOOD LEFT HAND   Final   Special Requests     Final   Value: BOTTLES DRAWN AEROBIC AND ANAEROBIC 10CC BLUE,5CC RED   Culture  Setup Time     Final   Value: 11/29/2013 08:44     Performed at [facility]   Culture     Final   Value:        BLOOD CULTURE RECEIVED NO GROWTH TO DATE CULTURE WILL BE HELD FOR 5 DAYS BEFORE ISSUING A FINAL NEGATIVE REPORT     Performed at [facility]   Report Status PENDING   Incomplete  CULTURE, EXPECTORATED SPUTUM-ASSESSMENT     Status: None   Collection Time    12/01/13  2:06 PM      Result Value Range Status   Specimen Description SPUTUM   Final   Special Requests NONE    Final   Sputum evaluation     Final   Value: MICROSCOPIC FINDINGS SUGGEST THAT THIS SPECIMEN IS NOT REPRESENTATIVE OF LOWER RESPIRATORY SECRETIONS. PLEASE RECOLLECT.     Stephani Police RN [phone] 12/01/13 A BROWNING   Report Status 12/01/2013 FINAL   Final  CULTURE, EXPECTORATED SPUTUM-ASSESSMENT     Status: None   Collection Time    12/02/13  6:25 PM      Result Value Range Status   Specimen Description SPUTUM   Final   Special Requests NONE   Final   Sputum evaluation     Final   Value: MICROSCOPIC FINDINGS SUGGEST THAT THIS SPECIMEN IS NOT REPRESENTATIVE OF LOWER RESPIRATORY SECRETIONS. PLEASE RECOLLECT.     CALLED TO GERHARDT,P RN 12/02/13 1920 [place]   Report Status 12/02/2013 FINAL   Final     Studies: No results found.  Scheduled Meds: . irbesartan  300 mg Oral Daily   And  . amLODipine  10 mg Oral Daily  . budesonide (PULMICORT) nebulizer solution  0.5 mg Nebulization BID  . enoxaparin (LOVENOX) injection  40 mg Subcutaneous Daily  . furosemide  40 mg Oral Daily  . insulin aspart  0-15 Units Subcutaneous TID WC  . insulin aspart  4 Units Subcutaneous TID WC  . insulin detemir  8 Units Subcutaneous QHS  . ipratropium-albuterol  3 mL Nebulization QID  . levofloxacin  750 mg Oral Daily  . linagliptin  5 mg Oral Q breakfast   And  . metFORMIN  1,000 mg Oral BID WC  . loratadine  10 mg Oral Daily  . [START ON 12/04/2013] predniSONE  50 mg Oral Q breakfast  . simvastatin  20 mg Oral QPM  . sodium chloride  3 mL Intravenous Q12H    Time spent: < 30 minutes   [name]  [facility] Pager [phone]. If 7PM-7AM, please contact night-coverage at [email or web address], password [facility] 12/03/2013, 6:42 PM  LOS: 5 days

## 2013-12-03 NOTE — Progress Notes (Signed)
Received page that patients heart rate elevated to 130s-140s. Upon entering room, patient having coughing spell, sitting in bed.  PRN cough medicine given as ordered.  Will continue to monitor patient.

## 2013-12-03 NOTE — Progress Notes (Signed)
Pt having episodes of coughing- bringing up white sputum. Has some wheezing afterward. Appetite better today. Still using 02, weaning slowly

## 2013-12-04 ENCOUNTER — Inpatient Hospital Stay (HOSPITAL_COMMUNITY): Payer: Medicare HMO

## 2013-12-04 LAB — GLUCOSE, CAPILLARY
Glucose-Capillary: 96 mg/dL (ref 70–99)
Glucose-Capillary: 125 mg/dL — ABNORMAL HIGH (ref 70–99)
Glucose-Capillary: 162 mg/dL — ABNORMAL HIGH (ref 70–99)

## 2013-12-04 MED ORDER — HYDROCOD POLST-CHLORPHEN POLST 10-8 MG/5ML PO LQCR: 5.0000 mL | Freq: Two times a day (BID) | ORAL | Status: DC | PRN

## 2013-12-04 MED ORDER — LORATADINE 10 MG PO TABS: 10.0000 mg | ORAL_TABLET | Freq: Every day | ORAL | Status: DC

## 2013-12-04 MED ORDER — FUROSEMIDE 40 MG PO TABS: 40.0000 mg | ORAL_TABLET | Freq: Every day | ORAL | Status: DC

## 2013-12-04 MED ORDER — LEVOFLOXACIN 750 MG PO TABS: 750.0000 mg | ORAL_TABLET | Freq: Every day | ORAL | Status: DC

## 2013-12-04 MED ORDER — PREDNISONE 20 MG PO TABS
ORAL_TABLET | ORAL | Status: DC
Start: 2013-12-04 — End: 2014-04-03

## 2013-12-04 MED ORDER — BENZONATATE 100 MG PO CAPS: 100.0000 mg | ORAL_CAPSULE | Freq: Three times a day (TID) | ORAL | Status: DC | PRN

## 2013-12-04 NOTE — Progress Notes (Addendum)
SATURATION QUALIFICATIONS: (This note is used to comply with regulatory documentation for home oxygen)  Patient Saturations on Room Air at Rest = 84%  Patient Saturations on 1 Liters of oxygen while Ambulating = 93-100%  Please briefly explain why patient needs home oxygen:

## 2013-12-04 NOTE — Care Management Note (Addendum)
    Page 1 of 2   12/06/2013     11:50:56 AM   CARE MANAGEMENT NOTE 12/06/2013  Patient:  Beth [name], Beth [name]   Account Number:  [id number]  Date Initiated:  12/04/2013  Documentation initiated by:  Mariann Laster  Subjective/Objective Assessment:   Admitted with shortness of breath     Action/Plan:   CM consult   Anticipated DC Date:  12/04/2013   Anticipated DC Plan:  HOME/SELF CARE         PAC Choice  DURABLE MEDICAL EQUIPMENT   Choice offered to / List presented to:  C-1 Patient   DME arranged  [facility]      DME agency  [place]     [place] arranged  HH-1 RN  HH-3 OT  HH-2 PT      [place].   Status of service:  Completed, signed off Medicare Important Message given?   (If response is "NO", the following Medicare IM given date fields will be blank) Date Medicare IM given:   Date Additional Medicare IM given:    Discharge Disposition:  HOME/SELF CARE  Per UR Regulation:  Reviewed for med. necessity/level of care/duration of stay  If discussed at [place] of Stay Meetings, dates discussed:    Comments:  12/06/2013 Post d/c note: Inbound Call from [place] [phone] cell (preferred contact #)  other [phone] and [phone] home. DTR states [facility] Jerrie Schussler has not contacted member since d/c date 12/04/2013 CM reviewed note:  [facility]:  RN, PT, OT ordered - FACE to [place] complete but was not called to Aslan Himes. CM confirmed elected Lyn Deemer [place] / Contact Butch Penny notified   - DTRs contact information provided to [facility]. CM provided CM contact # for call back if other questions/concerns. CM contacted DTR to confirm services arranged with [facility] and provided [facility] phone #. Disposition update:  Home with Reyno RN, BSN, [facility], CCM 12/06/2013  12/04/2013 Initial (Retro) completed 12/04/2013 for 1 Day Review based on 11/28/2013 MR. Last UR 12/04/2013 Dispositon:  Home/Self Care and Home Oxygen. Insurance [place]: [employer] - O2 ordered  with Huey Romans ADD:  today 12/04/2013 Crystal Hutchinson RN, BSN, [place], CCM 12/04/2013

## 2013-12-04 NOTE — Progress Notes (Signed)
DC IV, DC Tele, DC Home. Discharge instructions and home medications discussed with patient. Patient denied any questions or concerns at this time. Patient leaving unit via wheelchair with O2 and appears in no acute distress.  

## 2013-12-04 NOTE — Discharge Summary (Signed)
Physician Discharge Summary  Nikoleta Dady GBM:[id number] DOB: Feb 20, 1941 DOA: 11/28/2013  PCP: Salena Saner., MD  Admit date: 11/28/2013 Discharge date: 12/04/2013  Time spent: >30 minutes  Recommendations for Outpatient Follow-up:  1. BMET to follow electrolytes and renal function 2. Patient to follow with pulmonary service in 2 weeks  Discharge Diagnoses:  Acute resp failure with hypoxia due to bronchitis and COPD exacerbation COPD exacerbation Unspecified essential hypertension DM (diabetes mellitus) type 2, uncontrolled Acute bronchitis HTN  Discharge Condition: stable and improved. Will discharge home with [facility] services and oxygen supplementation.  Diet recommendation: low carbohydrates and low sodium diet  Filed Weights   12/02/13 0627 12/03/13 0635 12/04/13 0501  Weight: 79.2 kg (174 lb 9.7 oz) 79.379 kg (175 lb) 79.153 kg (174 lb 8 oz)    History of present illness:  73 y.o. female, with a history of COPD , diabetes mellitus , presenting to today with 3 days history of shortness of breath that is getting worse . She received Rocephin and steroids at an urgent care center for pneumonia however the shortness of breath or continued worsening. In the emergency room the patient was found to have a saturation of 84%, chest x-ray was negative and the blood cell count was not elevated. I was called to admit   Hospital Course:  1-Acute resp failure with hypoxia: due to COPD exacerbation.  -improved enough to discharge home and continue treatment with PO antibiotics, tapering prednisone and inhaler treatments -patient will follow with PCP and also with pulmonology as an outpatient (for last one she has an upcoming appointment) -just mild wheezing on exam at discharge -oxygen supplementation, with steadily/gradually weaning as tolerated has been arranged at discharge -continue PRN antitussives   2-HTN: stable. Will continue current antihypertensive regimen.  -continue heart  healthy diet   3-DM:  -CBG's high due to steroids -improving with steroids tapering  -advise to follow low carb diet -continue home hypoglycemic regimen  4-HLD:continue satins  5-physical deconditioning: will arrange [facility] services for PT/OT and RN. -patient reports she will no go to any SNF.  Procedures:  See below for x-ray reports   Consultations:  None   Discharge Exam: Filed Vitals:   12/04/13 0944  BP: 110/54  Pulse: 86  Temp:   Resp:     General: NAD, afebrile, improved air movement and good O2 sat on 2L oxygen supplementation  Cardiovascular: S1 and S2, no rubs or gallops Respiratory: slight exp wheezing, no crackles or rales; improved air movement Abd: soft, NT, ND, positive BS Ext: no edema or cyanosis  Discharge Instructions  Discharge Orders   Future Orders Complete By Expires   Diet - low sodium heart healthy  As directed    Discharge instructions  As directed    Comments:     Take medications as prescribed Follow with PCP in 10 days Arrange follow up with pulmonology service in 2 weeks Avoid prolonged exposure to cold temp/wheather  No smoking or exposure to second hand smoking.   Increase activity slowly  As directed        Medication List         albuterol 0.63 MG/3ML nebulizer solution  Commonly known as:  ACCUNEB  Take 1 ampule by nebulization every 6 (six) hours as needed for wheezing.     albuterol 108 (90 BASE) MCG/ACT inhaler  Commonly known as:  PROVENTIL HFA;VENTOLIN HFA  Inhale 2 puffs into the lungs every 6 (six) hours as needed for wheezing.  benzonatate 100 MG capsule  Commonly known as:  TESSALON  Take 1 capsule (100 mg total) by mouth 3 (three) times daily as needed for cough (Give in between doses of Tussionex).     budesonide-formoterol 160-4.5 MCG/ACT inhaler  Commonly known as:  SYMBICORT  Inhale 1 puff into the lungs 2 (two) times daily. Take 2 puffs first thing in am and then another 2 puffs about 12 hours later.      chlorpheniramine-HYDROcodone 10-8 MG/5ML Lqcr  Commonly known as:  TUSSIONEX  Take 5 mLs by mouth every 12 (twelve) hours as needed for cough.     furosemide 40 MG tablet  Commonly known as:  LASIX  Take 1 tablet (40 mg total) by mouth daily.     levofloxacin 750 MG tablet  Commonly known as:  LEVAQUIN  Take 1 tablet (750 mg total) by mouth daily.     loratadine 10 MG tablet  Commonly known as:  CLARITIN  Take 1 tablet (10 mg total) by mouth daily.     predniSONE 20 MG tablet  Commonly known as:  DELTASONE  Take 3 tabs by mouth daily X 1 day; then 2 tabs by mouth daily X 2 days; then 1 tab by mouth daily X 2 days; then 1/2 tab by mouth daily X 2 days and stop prednisone     simvastatin 20 MG tablet  Commonly known as:  ZOCOR  Take 20 mg by mouth every evening.     sitaGLIPtin-metformin 50-1000 MG per tablet  Commonly known as:  JANUMET  Take 1 tablet by mouth 2 (two) times daily with a meal.     TRIBENZOR 40-10-25 MG Tabs  Generic drug:  Olmesartan-Amlodipine-HCTZ  Take 1 tablet by mouth daily.       Allergies  Allergen Reactions  . Aldomet [Methyldopa] Hives       Follow-up Information   Follow up with Salena Saner., MD. Schedule an appointment as soon as possible for a visit in 10 days.   Specialty:  Internal Medicine   Contact information:   [address] [place] [address] [phone]       The results of significant diagnostics from this hospitalization (including imaging, microbiology, ancillary and laboratory) are listed below for reference.    Significant Diagnostic Studies: Dg Chest 2 View  12/04/2013   CLINICAL DATA:  Acute respiratory failure.  COPD exacerbation.  EXAM: CHEST  2 VIEW  COMPARISON:  11/29/2013  FINDINGS: Pulmonary hyperinflation at again seen, consistent with COPD. No evidence of pulmonary infiltrate or edema. No evidence of pleural effusion. No mass or lymphadenopathy identified.  IMPRESSION: COPD.  No  active disease.   Electronically Signed   By: Earle Gell M.D.   On: 12/04/2013 08:04   Dg Chest 2 View  11/29/2013   CLINICAL DATA:  Cough.  Shortness of breath.  EXAM: CHEST  2 VIEW  COMPARISON:  05/31/2012, 10/06/2010, 07/11/2009.  FINDINGS: Cardiac silhouette mildly enlarged but stable. Thoracic aorta mildly atherosclerotic, unchanged. Hilar and mediastinal contours otherwise unremarkable. Prominent bronchovascular markings diffusely and moderate central peribronchial thickening, more so than on the prior examinations. Stable mild hyperinflation. Lungs otherwise clear. No localized airspace consolidation. No pleural effusions. No pneumothorax. Normal pulmonary vascularity. Visualized bony thorax intact.  IMPRESSION: Moderate changes of acute bronchitis and/or asthma without localized airspace pneumonia. Stable mild cardiomegaly without pulmonary edema.   Electronically Signed   By: Evangeline Dakin M.D.   On: 11/29/2013 00:43   Ct Angio Chest  Pe W/cm &/or Wo Cm  11/29/2013   CLINICAL DATA:  Chest pain.  Shortness of breath.  Tachycardia.  EXAM: CT ANGIOGRAPHY CHEST WITH CONTRAST  TECHNIQUE: Multidetector CT imaging of the chest was performed using the standard protocol during bolus administration of intravenous contrast. Multiplanar CT image reconstructions and MIPs were obtained to evaluate the vascular anatomy.  CONTRAST:  122m OMNIPAQUE IOHEXOL 350 MG/ML IV.  COMPARISON:  None.  FINDINGS: Contrast opacification of the pulmonary arteries is very good. No filling defects within either main pulmonary artery or their branches in either lung to suggest pulmonary embolism. Heart mildly enlarged with left ventricular predominance and mild left ventricular hypertrophy. No significant pericardial effusion. Moderate to severe LAD coronary atherosclerosis. Moderate aortic valvular calcification. Moderate to severe atherosclerosis involving the thoracic upper abdominal aorta without aneurysm or dissection. Bovine  aortic arch anatomy (left common carotid artery arises from the innominate artery).  Emphysematous changes throughout both lungs. Scar and/or atelectasis in the lingula and right middle lobe. Tiny peripheral blebs in the right upper lobe. Central airways patent with calcified tracheobronchial cartilages and moderate to severe central bronchial wall thickening. No localized airspace consolidation. No pulmonary parenchymal nodules or masses. No pleural effusions.  Normal and upper normal sized bilateral hilar lymph nodes, the largest in the right hilum approximating 1.5 x 1.7 cm, statistically reactive. No significant mediastinal or axillary lymphadenopathy. Visualized thyroid gland unremarkable.  Numerous simple cysts involving the visualized liver. Visualized upper abdomen otherwise unremarkable for the early arterial phase of enhancement. Bone window images unremarkable.  Review of the MIP images confirms the above findings.  IMPRESSION: 1. No evidence of pulmonary embolism. 2. COPD/emphysema. Minimal scar/atelectasis in the lingula and right middle lobe. Marked central peribronchial thickening consistent with bronchitis and/or asthma.   Electronically Signed   By: TEvangeline DakinM.D.   On: 11/29/2013 01:46    Microbiology: Recent Results (from the past 240 hour(s))  CULTURE, BLOOD (ROUTINE X 2)     Status: None   Collection Time    11/29/13  3:50 AM      Result Value Range Status   Specimen Description BLOOD RIGHT HAND   Final   Special Requests BOTTLES DRAWN AEROBIC ONLY [facility] 2  Final   Culture  Setup Time     Final   Value: 11/29/2013 08:43     Performed at [facility]  Culture     Final   Value:        BLOOD CULTURE RECEIVED NO GROWTH TO DATE CULTURE WILL BE HELD FOR 5 DAYS BEFORE ISSUING A FINAL NEGATIVE REPORT     Performed at [facility]  Report Status PENDING   Incomplete  CULTURE, BLOOD (ROUTINE X 2)     Status: None   Collection Time    11/29/13  3:55 AM       Result Value Range Status   Specimen Description BLOOD LEFT HAND   Final   Special Requests     Final   Value: BOTTLES DRAWN AEROBIC AND ANAEROBIC 10CC BLUE,5CC RED   Culture  Setup Time     Final   Value: 11/29/2013 08:44     Performed at [facility]  Culture     Final   Value:        BLOOD CULTURE RECEIVED NO GROWTH TO DATE CULTURE WILL BE HELD FOR 5 DAYS BEFORE ISSUING A FINAL NEGATIVE REPORT     Performed at [facility]  Report Status PENDING   Incomplete  CULTURE, EXPECTORATED SPUTUM-ASSESSMENT     Status: None   Collection Time    12/01/13  2:06 PM      Result Value Range Status   Specimen Description SPUTUM   Final   Special Requests NONE   Final   Sputum evaluation     Final   Value: MICROSCOPIC FINDINGS SUGGEST THAT THIS SPECIMEN IS NOT REPRESENTATIVE OF LOWER RESPIRATORY SECRETIONS. PLEASE RECOLLECT.     Stephani Police RN [id number] 12/01/13 A BROWNING   Report Status 12/01/2013 FINAL   Final  CULTURE, EXPECTORATED SPUTUM-ASSESSMENT     Status: None   Collection Time    12/02/13  6:25 PM      Result Value Range Status   Specimen Description SPUTUM   Final   Special Requests NONE   Final   Sputum evaluation     Final   Value: MICROSCOPIC FINDINGS SUGGEST THAT THIS SPECIMEN IS NOT REPRESENTATIVE OF LOWER RESPIRATORY SECRETIONS. PLEASE RECOLLECT.     CALLED TO GERHARDT,P RN 12/02/13 [address] [place]   Report Status 12/02/2013 FINAL   Final     Labs: Basic Metabolic Panel:  Recent Labs Lab 11/28/13 2315 11/29/13 0350 12/01/13 0520  NA 137  --  137  K 4.3  --  4.9  CL 99  --  96  CO2  --   --  28  GLUCOSE 205*  --  206*  BUN 9  --  43*  CREATININE 0.70 0.67 0.93  CALCIUM  --   --  9.3   CBC:  Recent Labs Lab 11/28/13 2303 11/28/13 2315 11/29/13 0350  WBC 7.4  --  6.7  NEUTROABS 6.3  --   --   HGB 14.0 15.6* 13.8  HCT 42.1 46.0 42.5  MCV 93.6  --  92.4  PLT 231  --  244   CBG:  Recent Labs Lab 12/03/13 1122 12/03/13 1600  12/03/13 2053 12/04/13 0630 12/04/13 1101  GLUCAP 148* 190* 138* 96 125*    Signed:  Evalin Shawhan  [facility] 12/04/2013, 12:57 PM

## 2013-12-05 LAB — CULTURE, BLOOD (ROUTINE X 2)
CULTURE: NO GROWTH
CULTURE: NO GROWTH

## 2014-01-17 ENCOUNTER — Other Ambulatory Visit: Payer: Self-pay | Admitting: Internal Medicine

## 2014-02-20 ENCOUNTER — Other Ambulatory Visit: Payer: Self-pay | Admitting: Internal Medicine

## 2014-03-27 ENCOUNTER — Other Ambulatory Visit: Payer: Self-pay | Admitting: Internal Medicine

## 2014-03-30 ENCOUNTER — Telehealth: Payer: Self-pay | Admitting: Internal Medicine

## 2014-03-30 ENCOUNTER — Other Ambulatory Visit: Payer: Self-pay | Admitting: Internal Medicine

## 2014-03-30 MED ORDER — BUDESONIDE-FORMOTEROL FUMARATE 160-4.5 MCG/ACT IN AERO: INHALATION_SPRAY | RESPIRATORY_TRACT | Status: DC

## 2014-03-30 NOTE — Telephone Encounter (Signed)
Called and spoke to pt. Pt stated she is need of Symbicort 160 refill. Pt is completely out medication with no refills. Advised pt to keep upcoming apt on 5/19 and will send in 1 inhaler with no refills. Informed pt once she is seen they can renew the rx with additional refills. Pt verbalized understanding and denied any further questions or concerns at this time.

## 2014-04-03 ENCOUNTER — Encounter: Payer: Self-pay | Admitting: Internal Medicine

## 2014-04-03 ENCOUNTER — Ambulatory Visit: Payer: Commercial Managed Care - HMO | Admitting: Internal Medicine

## 2014-04-03 ENCOUNTER — Ambulatory Visit (INDEPENDENT_AMBULATORY_CARE_PROVIDER_SITE_OTHER): Payer: Commercial Managed Care - HMO | Admitting: Internal Medicine

## 2014-04-03 VITALS — BP 144/76 | HR 90 | Temp 97.5°F | Ht 62.0 in | Wt 172.8 lb

## 2014-04-03 DIAGNOSIS — J449 Chronic obstructive pulmonary disease, unspecified: Secondary | ICD-10-CM

## 2014-04-03 MED ORDER — BUDESONIDE-FORMOTEROL FUMARATE 160-4.5 MCG/ACT IN AERO: INHALATION_SPRAY | RESPIRATORY_TRACT | Status: DC

## 2014-04-03 MED ORDER — ALBUTEROL SULFATE HFA 108 (90 BASE) MCG/ACT IN AERS: INHALATION_SPRAY | RESPIRATORY_TRACT | Status: DC

## 2014-04-03 NOTE — Patient Instructions (Addendum)
Work on inhaler technique:  relax and gently blow all the way out then take a nice smooth deep breath back in, triggering the inhaler at same time you start breathing in.  Hold for up to 5 seconds if you can.  Rinse and gargle with water when done  Ideally you should  get Prevnar (the new pneumonia shot) at  time of your next check up or  next flu vaccination    If you are satisfied with your treatment plan let your doctor know and he/she can either refill your medications or you can return here when your prescription runs out.     If in any way you are not 100% satisfied,  please tell us.  If 100% better, tell your friends!   Pulmonary f/u is as needed

## 2014-04-03 NOTE — Progress Notes (Signed)
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 Some worse in spring and ? Summer with hot humid weather with subjective wheeze while on spiriva x 2 years then advair 7/13 and prednisone has helped to point where need neb less "only  3 x day" and puffer 3-4 times as well. Assoc with voice change/ hoarsenss, ear pain.   No unusual cough, purulent sputum or sinus/hb symptoms on present rx. rec Work on inhaler technique:   Only use your albuterol as a rescue medication (proaire is Plan B,  Nebulizer is C) Add pepcid 20 mg one every night at bedtime GERD diet office visit in 4 weeks, sooner if needed with PFTs    10/05/2012 f/u ov/Arthi Mcdonald cc no limit to activities due to sob, some hoarseness, min daytime saba use rec Try off spiriva to see if you notice any difference but keep your rescue inhaler handy and restart the spiriva if needed   12/26/2012 f/u ov/Brae Gartman cc no limiting sob doing ok off spiriva and on advair alone but variable hoarseness.  Min daytime saba rec Plan A= automatic / take no matter what = Symbicort 160 Take 2 puffs first thing in am and then another 2 puffs about 12 hours later should cause less hoarseness but if not ok to resume advair as plan A  Plan B = proaire(albuterol)  only if needed if can't catch breath to supplement plan A Plan C = nebulizer (albuterol) only if Plan B doesn't work  03/28/2013 f/u ov/Kleber Crean  Chief Complaint  Patient presents with  . Follow-up    Breathing is unchanged since the last visit. She feels like symbicort makes her jittery.    Much less need for proaire but still uses it around noon, hoarsness gone but now jittery since  chaned advair to symbicort.  Not using neb alb at all now.  rec Try symbicort 160 one twice daily  Only use your albuterol as a rescue medication   If you are limited from doing what you want and the albuterol doesn't help you do it and you can't tolerate twice daily symbicort at two puffs dose then add back spiriva daily but note it takes about 10 days to feel the spiriva working to improve your activity tolerance   04/03/2014 f/u ov/Elton Heid re: copd III / symbicort 160 2bid / proair if over does it Chief Complaint  Patient presents with  . Follow-up    No change in breathing, occas wheezing w/ exertion. uses O2 at bedtime      No obvious daytime variabilty or assoc chronic cough or cp or chest tightness,   overt sinus or hb symptoms. No unusual exp hx or h/o childhood pna/ asthma or premature birth to her knowledge.    Sleeping ok without nocturnal  or early am exacerbation  of respiratory  c/o's or need for noct saba. Also denies any obvious fluctuation of symptoms with weather or environmental changes or other aggravating or alleviating factors except as outlined above    Current Medications, Allergies, Past Medical History, Past Surgical History, Family History, and Social History were reviewed in [employer]  electronic medical record.  ROS  The following are not active complaints unless bolded sore throat, dysphagia, dental problems, itching, sneezing,  nasal congestion or excess/ purulent secretions, ear ache,   fever, chills, sweats, unintended wt loss, pleuritic or exertional cp, hemoptysis,  orthopnea pnd or leg swelling, presyncope, palpitations, heartburn, abdominal pain, anorexia, nausea, vomiting, diarrhea  or change in bowel or urinary habits, change in stools or urine, dysuria,hematuria,  rash, arthralgias, visual complaints, headache, numbness weakness or ataxia or problems with walking or coordination,  change in mood/affect or memory.                 Objective:    Physical Exam  Wt 182 10/05/2012 > 12/26/2012 178 > 178 03/28/2013 > 04/03/2014  173    06/30/12 177 lb (80.287 kg)  05/31/12 182 lb 12.8 oz (82.918 kg)     HEENT mild turbinate edema.  Oropharynx no thrush or excess pnd or cobblestoning.  No JVD or cervical adenopathy. Mild accessory muscle hypertrophy. Trachea midline, nl thryroid. Chest was hyperinflated by percussion with diminished breath sounds and moderate increased exp time without wheeze. Hoover sign positive at mid inspiration. Regular rate and rhythm without murmur gallop or rub or increase P2 or edema.  Abd: no hsm, nl excursion. Ext warm without cyanosis or clubbing.       cxr 12/04/13 COPD. No active disease          Assessment & Plan:

## 2014-04-04 NOTE — Assessment & Plan Note (Signed)
-  PFT's 06/30/2012 FEV1  0.93 (49%) ratio 49 and DLCO 47 corrects to 87  The proper method of use, as well as anticipated side effects, of a metered-dose inhaler are discussed and demonstrated to the patient. Improved effectiveness after extensive coaching during this visit to a level of approximately  90%  Doing well on just laba/ics so no need for lama  Reviewed approp use for saba and call us back prn, refills per primary ok with me

## 2014-04-22 ENCOUNTER — Other Ambulatory Visit: Payer: Self-pay | Admitting: Internal Medicine

## 2015-02-02 DIAGNOSIS — Z1231 Encounter for screening mammogram for malignant neoplasm of breast: Secondary | ICD-10-CM | POA: Diagnosis not present

## 2015-03-07 DIAGNOSIS — E1121 Type 2 diabetes mellitus with diabetic nephropathy: Secondary | ICD-10-CM | POA: Diagnosis not present

## 2015-03-07 DIAGNOSIS — I10 Essential (primary) hypertension: Secondary | ICD-10-CM | POA: Diagnosis not present

## 2015-06-18 DIAGNOSIS — E119 Type 2 diabetes mellitus without complications: Secondary | ICD-10-CM | POA: Diagnosis not present

## 2015-06-18 DIAGNOSIS — E785 Hyperlipidemia, unspecified: Secondary | ICD-10-CM | POA: Diagnosis not present

## 2015-06-18 DIAGNOSIS — I10 Essential (primary) hypertension: Secondary | ICD-10-CM | POA: Diagnosis not present

## 2015-06-18 DIAGNOSIS — E784 Other hyperlipidemia: Secondary | ICD-10-CM | POA: Diagnosis not present

## 2015-06-18 DIAGNOSIS — J449 Chronic obstructive pulmonary disease, unspecified: Secondary | ICD-10-CM | POA: Diagnosis not present

## 2016-03-16 ENCOUNTER — Ambulatory Visit
Admission: RE | Admit: 2016-03-16 | Discharge: 2016-03-16 | Disposition: A | Discharge status: Home / Self Care | Payer: Commercial Managed Care - HMO | Source: Ambulatory Visit | Attending: Internal Medicine | Admitting: Internal Medicine

## 2016-03-16 ENCOUNTER — Other Ambulatory Visit: Payer: Self-pay | Admitting: Internal Medicine

## 2016-03-16 DIAGNOSIS — J449 Chronic obstructive pulmonary disease, unspecified: Secondary | ICD-10-CM

## 2016-03-27 ENCOUNTER — Emergency Department (HOSPITAL_COMMUNITY): Payer: Medicare HMO

## 2016-03-27 ENCOUNTER — Inpatient Hospital Stay (HOSPITAL_COMMUNITY)
Admission: EM | Admit: 2016-03-27 | Discharge: 2016-04-06 | DRG: 286 | Disposition: A | Discharge status: Home Health | Payer: Medicare HMO | Attending: Internal Medicine | Admitting: Internal Medicine

## 2016-03-27 ENCOUNTER — Encounter (HOSPITAL_COMMUNITY): Payer: Self-pay | Admitting: Vascular Surgery

## 2016-03-27 DIAGNOSIS — I4510 Unspecified right bundle-branch block: Secondary | ICD-10-CM | POA: Diagnosis present

## 2016-03-27 DIAGNOSIS — J449 Chronic obstructive pulmonary disease, unspecified: Secondary | ICD-10-CM | POA: Diagnosis present

## 2016-03-27 DIAGNOSIS — I272 Other secondary pulmonary hypertension: Secondary | ICD-10-CM | POA: Diagnosis present

## 2016-03-27 DIAGNOSIS — I509 Heart failure, unspecified: Secondary | ICD-10-CM | POA: Diagnosis present

## 2016-03-27 DIAGNOSIS — N179 Acute kidney failure, unspecified: Secondary | ICD-10-CM | POA: Diagnosis not present

## 2016-03-27 DIAGNOSIS — I471 Supraventricular tachycardia: Secondary | ICD-10-CM | POA: Diagnosis not present

## 2016-03-27 DIAGNOSIS — I110 Hypertensive heart disease with heart failure: Secondary | ICD-10-CM | POA: Diagnosis present

## 2016-03-27 DIAGNOSIS — I10 Essential (primary) hypertension: Secondary | ICD-10-CM | POA: Diagnosis present

## 2016-03-27 DIAGNOSIS — E873 Alkalosis: Secondary | ICD-10-CM | POA: Diagnosis not present

## 2016-03-27 DIAGNOSIS — Z833 Family history of diabetes mellitus: Secondary | ICD-10-CM | POA: Diagnosis not present

## 2016-03-27 DIAGNOSIS — I5033 Acute on chronic diastolic (congestive) heart failure: Secondary | ICD-10-CM | POA: Diagnosis present

## 2016-03-27 DIAGNOSIS — Z8249 Family history of ischemic heart disease and other diseases of the circulatory system: Secondary | ICD-10-CM | POA: Diagnosis not present

## 2016-03-27 DIAGNOSIS — Z9119 Patient's noncompliance with other medical treatment and regimen: Secondary | ICD-10-CM | POA: Diagnosis not present

## 2016-03-27 DIAGNOSIS — R0600 Dyspnea, unspecified: Secondary | ICD-10-CM | POA: Diagnosis not present

## 2016-03-27 DIAGNOSIS — J810 Acute pulmonary edema: Secondary | ICD-10-CM | POA: Diagnosis not present

## 2016-03-27 DIAGNOSIS — J9601 Acute respiratory failure with hypoxia: Secondary | ICD-10-CM | POA: Diagnosis not present

## 2016-03-27 DIAGNOSIS — I2699 Other pulmonary embolism without acute cor pulmonale: Secondary | ICD-10-CM

## 2016-03-27 DIAGNOSIS — Z7951 Long term (current) use of inhaled steroids: Secondary | ICD-10-CM

## 2016-03-27 DIAGNOSIS — E1165 Type 2 diabetes mellitus with hyperglycemia: Secondary | ICD-10-CM | POA: Diagnosis present

## 2016-03-27 DIAGNOSIS — G4733 Obstructive sleep apnea (adult) (pediatric): Secondary | ICD-10-CM | POA: Diagnosis present

## 2016-03-27 DIAGNOSIS — I959 Hypotension, unspecified: Secondary | ICD-10-CM | POA: Diagnosis not present

## 2016-03-27 DIAGNOSIS — I952 Hypotension due to drugs: Secondary | ICD-10-CM | POA: Diagnosis not present

## 2016-03-27 DIAGNOSIS — Z87891 Personal history of nicotine dependence: Secondary | ICD-10-CM | POA: Diagnosis not present

## 2016-03-27 DIAGNOSIS — T501X5A Adverse effect of loop [high-ceiling] diuretics, initial encounter: Secondary | ICD-10-CM | POA: Diagnosis not present

## 2016-03-27 DIAGNOSIS — E785 Hyperlipidemia, unspecified: Secondary | ICD-10-CM | POA: Diagnosis present

## 2016-03-27 DIAGNOSIS — Z8673 Personal history of transient ischemic attack (TIA), and cerebral infarction without residual deficits: Secondary | ICD-10-CM | POA: Diagnosis not present

## 2016-03-27 DIAGNOSIS — I270 Primary pulmonary hypertension: Secondary | ICD-10-CM | POA: Diagnosis not present

## 2016-03-27 DIAGNOSIS — I5032 Chronic diastolic (congestive) heart failure: Secondary | ICD-10-CM

## 2016-03-27 LAB — CBC
HCT: 42.6 % (ref 36.0–46.0)
HEMATOCRIT: 42.4 % (ref 36.0–46.0)
HEMOGLOBIN: 13.7 g/dL (ref 12.0–15.0)
Hemoglobin: 13.8 g/dL (ref 12.0–15.0)
MCH: 29.1 pg (ref 26.0–34.0)
MCH: 29 pg (ref 26.0–34.0)
MCHC: 32.4 g/dL (ref 30.0–36.0)
MCHC: 32.3 g/dL (ref 30.0–36.0)
MCV: 89.9 fL (ref 78.0–100.0)
MCV: 89.6 fL (ref 78.0–100.0)
Platelets: 258 10*3/uL (ref 150–400)
Platelets: 235 10*3/uL (ref 150–400)
RBC: 4.74 MIL/uL (ref 3.87–5.11)
RBC: 4.73 MIL/uL (ref 3.87–5.11)
RDW: 17.3 % — ABNORMAL HIGH (ref 11.5–15.5)
RDW: 17.1 % — ABNORMAL HIGH (ref 11.5–15.5)
WBC: 9.3 10*3/uL (ref 4.0–10.5)
WBC: 7.4 10*3/uL (ref 4.0–10.5)

## 2016-03-27 LAB — BRAIN NATRIURETIC PEPTIDE: B NATRIURETIC PEPTIDE 5: 249.8 pg/mL — AB (ref 0.0–100.0)

## 2016-03-27 LAB — BASIC METABOLIC PANEL WITH GFR
Anion gap: 11 (ref 5–15)
BUN: 25 mg/dL — ABNORMAL HIGH (ref 6–20)
CO2: 20 mmol/L — ABNORMAL LOW (ref 22–32)
Calcium: 9.4 mg/dL (ref 8.9–10.3)
Chloride: 104 mmol/L (ref 101–111)
Creatinine, Ser: 0.95 mg/dL (ref 0.44–1.00)
GFR calc Af Amer: >60 mL/min
GFR calc non Af Amer: 57 mL/min — ABNORMAL LOW
Glucose, Bld: 353 mg/dL — ABNORMAL HIGH (ref 65–99)
Potassium: 4 mmol/L (ref 3.5–5.1)
Sodium: 135 mmol/L (ref 135–145)

## 2016-03-27 LAB — CREATININE, SERUM
Creatinine, Ser: 0.86 mg/dL (ref 0.44–1.00)
GFR calc Af Amer: >60 mL/min (ref >60)

## 2016-03-27 LAB — GLUCOSE, CAPILLARY: Glucose-Capillary: 247 mg/dL — ABNORMAL HIGH (ref 65–99)

## 2016-03-27 LAB — I-STAT TROPONIN, ED: Troponin i, poc: 0.04 ng/mL (ref 0.00–0.08)

## 2016-03-27 MED ORDER — IPRATROPIUM-ALBUTEROL 0.5-2.5 (3) MG/3ML IN SOLN: 3.0000 mL | RESPIRATORY_TRACT | Status: DC | PRN

## 2016-03-27 MED ORDER — SODIUM CHLORIDE 0.9 % IV SOLN: 250.0000 mL | INTRAVENOUS | Status: DC | PRN

## 2016-03-27 MED ORDER — FUROSEMIDE 10 MG/ML IJ SOLN
40.0000 mg | Freq: Once | INTRAMUSCULAR | Status: AC
  Administered 2016-03-27: 40 mg via INTRAVENOUS
  Filled 2016-03-27: qty 4

## 2016-03-27 MED ORDER — HEPARIN SODIUM (PORCINE) 5000 UNIT/ML IJ SOLN
5000.0000 [IU] | Freq: Three times a day (TID) | INTRAMUSCULAR | Status: DC
  Administered 2016-03-27 – 2016-04-06 (×29): 5000 [IU] via SUBCUTANEOUS
  Filled 2016-03-27 (×28): qty 1

## 2016-03-27 MED ORDER — LORATADINE 10 MG PO TABS: 10.0000 mg | ORAL_TABLET | Freq: Every day | ORAL | Status: DC

## 2016-03-27 MED ORDER — FLUTICASONE FUROATE-VILANTEROL 200-25 MCG/INH IN AEPB: 1.0000 | INHALATION_SPRAY | Freq: Every day | RESPIRATORY_TRACT | Status: DC

## 2016-03-27 MED ORDER — SODIUM CHLORIDE 0.9% FLUSH: 3.0000 mL | INTRAVENOUS | Status: DC | PRN

## 2016-03-27 MED ORDER — ACETAMINOPHEN 325 MG PO TABS
650.0000 mg | ORAL_TABLET | ORAL | Status: DC | PRN
  Administered 2016-03-29 – 2016-04-01 (×6): 650 mg via ORAL
  Filled 2016-03-27 (×6): qty 2

## 2016-03-27 MED ORDER — OMEGA-3-ACID ETHYL ESTERS 1 G PO CAPS
1.0000 g | ORAL_CAPSULE | Freq: Every day | ORAL | Status: DC
  Administered 2016-03-27 – 2016-04-06 (×11): 1 g via ORAL
  Filled 2016-03-27 (×11): qty 1

## 2016-03-27 MED ORDER — ONDANSETRON HCL 4 MG/2ML IJ SOLN: 4.0000 mg | Freq: Four times a day (QID) | INTRAMUSCULAR | Status: DC | PRN

## 2016-03-27 MED ORDER — IRBESARTAN 300 MG PO TABS
150.0000 mg | ORAL_TABLET | Freq: Every evening | ORAL | Status: DC
  Administered 2016-03-27 – 2016-03-28 (×2): 150 mg via ORAL
  Filled 2016-03-27: qty 1

## 2016-03-27 MED ORDER — SODIUM CHLORIDE 0.9% FLUSH
3.0000 mL | Freq: Two times a day (BID) | INTRAVENOUS | Status: DC
  Administered 2016-03-28 – 2016-04-02 (×6): 3 mL via INTRAVENOUS

## 2016-03-27 MED ORDER — HYDROCHLOROTHIAZIDE 25 MG PO TABS
25.0000 mg | ORAL_TABLET | Freq: Every evening | ORAL | Status: DC
  Administered 2016-03-27: 25 mg via ORAL
  Filled 2016-03-27: qty 1

## 2016-03-27 MED ORDER — PANTOPRAZOLE SODIUM 40 MG PO TBEC
40.0000 mg | DELAYED_RELEASE_TABLET | Freq: Every day | ORAL | Status: DC
  Administered 2016-03-27 – 2016-04-06 (×11): 40 mg via ORAL
  Filled 2016-03-27 (×11): qty 1

## 2016-03-27 MED ORDER — MOMETASONE FURO-FORMOTEROL FUM 200-5 MCG/ACT IN AERO
2.0000 | INHALATION_SPRAY | Freq: Two times a day (BID) | RESPIRATORY_TRACT | Status: DC
  Administered 2016-03-27 – 2016-04-06 (×17): 2 via RESPIRATORY_TRACT
  Filled 2016-03-27: qty 8.8

## 2016-03-27 MED ORDER — VALSARTAN-HYDROCHLOROTHIAZIDE 160-25 MG PO TABS: 1.0000 | ORAL_TABLET | Freq: Every evening | ORAL | Status: DC

## 2016-03-27 MED ORDER — OMEGA 3 1000 MG PO CAPS
1.0000 | ORAL_CAPSULE | Freq: Every day | ORAL | Status: DC
Start: 2016-03-27 — End: 2016-03-27
  Filled 2016-03-27: qty 1

## 2016-03-27 MED ORDER — SODIUM CHLORIDE 0.9% FLUSH
3.0000 mL | Freq: Two times a day (BID) | INTRAVENOUS | Status: DC
Start: 2016-03-27 — End: 2016-04-06
  Administered 2016-03-27 – 2016-04-06 (×14): 3 mL via INTRAVENOUS

## 2016-03-27 MED ORDER — IPRATROPIUM-ALBUTEROL 0.5-2.5 (3) MG/3ML IN SOLN
3.0000 mL | Freq: Three times a day (TID) | RESPIRATORY_TRACT | Status: DC
  Administered 2016-03-28 (×2): 3 mL via RESPIRATORY_TRACT
  Filled 2016-03-27 (×3): qty 3

## 2016-03-27 MED ORDER — IPRATROPIUM-ALBUTEROL 0.5-2.5 (3) MG/3ML IN SOLN
3.0000 mL | Freq: Four times a day (QID) | RESPIRATORY_TRACT | Status: DC
  Administered 2016-03-27: 3 mL via RESPIRATORY_TRACT
  Filled 2016-03-27: qty 3

## 2016-03-27 MED ORDER — ALLOPURINOL 300 MG PO TABS
300.0000 mg | ORAL_TABLET | Freq: Every day | ORAL | Status: DC
  Administered 2016-03-27 – 2016-04-06 (×11): 300 mg via ORAL
  Filled 2016-03-27 (×11): qty 1

## 2016-03-27 MED ORDER — METOPROLOL TARTRATE 25 MG PO TABS
25.0000 mg | ORAL_TABLET | Freq: Two times a day (BID) | ORAL | Status: DC
Start: 2016-03-27 — End: 2016-03-30
  Administered 2016-03-27 – 2016-03-29 (×4): 25 mg via ORAL
  Filled 2016-03-27 (×5): qty 1

## 2016-03-27 MED ORDER — LORATADINE 10 MG PO TABS
10.0000 mg | ORAL_TABLET | Freq: Every day | ORAL | Status: DC
  Administered 2016-03-27 – 2016-04-06 (×11): 10 mg via ORAL
  Filled 2016-03-27 (×11): qty 1

## 2016-03-27 MED ORDER — IPRATROPIUM-ALBUTEROL 0.5-2.5 (3) MG/3ML IN SOLN
3.0000 mL | Freq: Once | RESPIRATORY_TRACT | Status: AC
  Administered 2016-03-27: 3 mL via RESPIRATORY_TRACT
  Filled 2016-03-27: qty 3

## 2016-03-27 MED ORDER — FUROSEMIDE 10 MG/ML IJ SOLN
40.0000 mg | Freq: Two times a day (BID) | INTRAMUSCULAR | Status: DC
  Administered 2016-03-27 – 2016-03-28 (×2): 40 mg via INTRAVENOUS
  Filled 2016-03-27: qty 4

## 2016-03-27 MED ORDER — ATORVASTATIN CALCIUM 40 MG PO TABS
40.0000 mg | ORAL_TABLET | Freq: Every day | ORAL | Status: DC
  Administered 2016-03-28 – 2016-04-05 (×9): 40 mg via ORAL
  Filled 2016-03-27 (×9): qty 1

## 2016-03-27 MED ORDER — ASPIRIN EC 81 MG PO TBEC
81.0000 mg | DELAYED_RELEASE_TABLET | Freq: Every day | ORAL | Status: DC
  Administered 2016-03-27 – 2016-04-06 (×11): 81 mg via ORAL
  Filled 2016-03-27 (×11): qty 1

## 2016-03-27 NOTE — ED Provider Notes (Signed)
CSN: [id number]     Arrival date & time 03/27/16  1314 History   First MD Initiated Contact with Patient 03/27/16 1456     Chief Complaint  Patient presents with  . Shortness of Breath     Patient is a 75 y.o. female presenting with shortness of breath. The history is provided by the patient. No language interpreter was used.  Shortness of Breath  Nichol R Lehenbauer is a 75 y.o. female who presents to the Emergency Department complaining of SOB. She has a history of COPD and asthma and recently started on dulera and a second respiratory medication. 3 weeks ago she developed increased shortness of breath and dyspnea on exertion. She was treated with a course of prednisone and antibiotics and this was completed about a week ago. She denies any fevers, cough, chest pain, abdominal pain, change in urination. She has increased lower extremity edema as well as orthopnea. She is unable to ambulate even short distances without significant shortness of breath. Symptoms are moderate to severe, constant, worsening.   Past Medical History  Diagnosis Date  . Diabetes mellitus ([place])     type 2  . Hyperlipidemia   . Hypertension   . COPD (chronic obstructive pulmonary disease) ([place])     dR. wERT  . Shortness of breath     WITH EXERTION   . Asthma   . Sleep apnea     USES  CPAP   . Arthritis   . Stroke [facility])    Past Surgical History  Procedure Laterality Date  . Tubal ligation    . Cataract extraction w/phaco Right 04/19/2013    Procedure: CATARACT EXTRACTION PHACO AND INTRAOCULAR LENS PLACEMENT (IOC);  Surgeon: Adonis Brook, MD;  Location: [place];  Service: Ophthalmology;  Laterality: Right;  . Eye surgery    . Cataract extraction w/phaco Left 05/03/2013    Procedure: CATARACT EXTRACTION PHACO AND INTRAOCULAR LENS PLACEMENT (IOC);  Surgeon: Adonis Brook, MD;  Location: [place];  Service: Ophthalmology;  Laterality: Left;   Family History  Problem Relation Age of Onset  . Heart disease Mother   .  Heart disease Brother   . Heart disease Brother   . Heart disease Sister   . Heart disease Sister    Social History  Substance Use Topics  . Smoking status: Former Smoker -- 1.50 packs/day for 40 years    Types: Cigarettes    Quit date: 11/16/2004  . Smokeless tobacco: Former Systems developer  . Alcohol Use: No     Comment: occassional   OB History    No data available     Review of Systems  Respiratory: Positive for shortness of breath.   All other systems reviewed and are negative.     Allergies  Aldomet  Home Medications   Prior to Admission medications   Medication Sig Start Date End Date Taking? Authorizing Provider  albuterol (ACCUNEB) 0.63 MG/3ML nebulizer solution Take 1 ampule by nebulization every 6 (six) hours as needed for wheezing.    Yes Historical Provider, MD  albuterol (PROAIR HFA) 108 (90 BASE) MCG/ACT inhaler 2 puffs every 4 hours as needed only  if your can't catch your breath Patient taking differently: Inhale 2 puffs into the lungs every 4 (four) hours as needed for wheezing or shortness of breath. 2 puffs every 4 hours as needed only  if your can't catch your breath 04/03/14  Yes Tanda Rockers, MD  allopurinol (ZYLOPRIM) 300 MG tablet Take 300 mg by mouth  daily.   Yes Historical Provider, MD  atorvastatin (LIPITOR) 40 MG tablet Take 40 mg by mouth daily at 6 PM.   Yes Historical Provider, MD  B Complex-C (SUPER B COMPLEX/VITAMIN C) TABS Take 1 tablet by mouth daily.   Yes Historical Provider, MD  Cholecalciferol 1000 units tablet Take 1,000 Units by mouth daily.   Yes Historical Provider, MD  Coenzyme Q10 (COQ10) 200 MG CAPS Take 200 mg by mouth daily.   Yes Historical Provider, MD  Dulaglutide (TRULICITY) 2.11 HE/1.7EY SOPN Inject 0.75 mg into the skin every Monday.   Yes Historical Provider, MD  fexofenadine (ALLEGRA) 180 MG tablet Take 180 mg by mouth daily.   Yes Historical Provider, MD  Glycopyrrolate-Formoterol (BEVESPI AEROSPHERE) 9-4.8 MCG/ACT AERO Inhale  2 puffs into the lungs 2 (two) times daily.   Yes Historical Provider, MD  meloxicam (MOBIC) 15 MG tablet Take 15 mg by mouth daily as needed for pain.   Yes Historical Provider, MD  mometasone-formoterol (DULERA) 200-5 MCG/ACT AERO Inhale 2 puffs into the lungs 2 (two) times daily.   Yes Historical Provider, MD  Omega 3 1000 MG CAPS Take 1 capsule by mouth daily.   Yes Historical Provider, MD  omeprazole (PRILOSEC) 40 MG capsule Take 40 mg by mouth daily.   Yes Historical Provider, MD  valsartan-hydrochlorothiazide (DIOVAN-HCT) 160-25 MG tablet Take 1 tablet by mouth every evening.   Yes Historical Provider, MD  budesonide-formoterol (SYMBICORT) 160-4.5 MCG/ACT inhaler INHALE 2 PUFFS FIRST THING IN THE MORNING AND THEN ANOTHER 2 PUFFS ABOUT 12 HOURS LATER 04/03/14   Tanda Rockers, MD  furosemide (LASIX) 40 MG tablet Take 1 tablet (40 mg total) by mouth daily. 12/04/13   Barton Dubois, MD  loratadine (CLARITIN) 10 MG tablet Take 1 tablet (10 mg total) by mouth daily. 12/04/13   Barton Dubois, MD  SYMBICORT 160-4.5 MCG/ACT inhaler INHALE 2 PUFFS FIRST THING IN THE MORNING AND THEN ANOTHER 2 PUFFS ABOUT 12 HOURS LATER    Tanda Rockers, MD   BP 127/76 mmHg  Pulse 87  Temp(Src) 97.7 F (36.5 C) (Oral)  Resp 20  Ht _0  (1.575 m)  Wt 170 lb 3.2 oz (77.202 kg)  BMI 31.12 kg/m2  SpO2 90% Physical Exam  Constitutional: She is oriented to person, place, and time. She appears well-developed and well-nourished.  HENT:  Head: Normocephalic and atraumatic.  Cardiovascular: Regular rhythm.   No murmur heard. tachycardic  Pulmonary/Chest: No respiratory distress.  Tachypnea with decreased air movement bilaterally  Abdominal: Soft. There is no tenderness. There is no rebound and no guarding.  Musculoskeletal: She exhibits no tenderness.  1+ pitting edema in BLE  Neurological: She is alert and oriented to person, place, and time.  Skin: Skin is warm and dry.  Psychiatric: She has a normal mood and  affect. Her behavior is normal.  Nursing note and vitals reviewed.   ED Course  Procedures (including critical care time) Labs Review Labs Reviewed  BASIC METABOLIC PANEL - Abnormal; Notable for the following:    CO2 20 (*)    Glucose, Bld 353 (*)    BUN 25 (*)    GFR calc non Af Amer 57 (*)    All other components within normal limits  CBC - Abnormal; Notable for the following:    RDW 17.3 (*)    All other components within normal limits  BRAIN NATRIURETIC PEPTIDE - Abnormal; Notable for the following:    B Natriuretic Peptide 249.8 (*)  All other components within normal limits  CBC - Abnormal; Notable for the following:    RDW 17.1 (*)    All other components within normal limits  GLUCOSE, CAPILLARY - Abnormal; Notable for the following:    Glucose-Capillary 247 (*)    All other components within normal limits  CREATININE, SERUM  COMPREHENSIVE METABOLIC PANEL  I-STAT TROPOININ, ED    Imaging Review Dg Chest 2 View  03/27/2016  CLINICAL DATA:  Shortness of breath for 3 weeks. Chest tightness for 2 weeks EXAM: CHEST  2 VIEW COMPARISON:  Mar 16, 2016 FINDINGS: There is no edema or consolidation. There is slight scarring in the bases bilaterally. Heart is upper normal in size with pulmonary vascularity within normal limits. There is atherosclerotic calcification in the aortic arch region. No adenopathy. No bone lesions. There are scattered foci of carotid artery calcification bilaterally. IMPRESSION: Slight bibasilar scarring. No edema or consolidation. Scattered foci of carotid artery calcification bilaterally. Electronically Signed   By: Lowella Grip III M.D.   On: 03/27/2016 13:55   I have personally reviewed and evaluated these images and lab results as part of my medical decision-making.   EKG Interpretation   Date/Time:  Friday Mar 27 2016 13:19:40 EDT Ventricular Rate:  108 PR Interval:  136 QRS Duration: 114 QT Interval:  336 QTC Calculation: 450 R Axis:    129 Text Interpretation:  Sinus tachycardia with Premature atrial complexes  Right bundle branch block Septal infarct , age undetermined T wave  abnormality, consider inferolateral ischemia Abnormal ECG Confirmed by  Hazle Coca [phone]) on 03/27/2016 2:59:16 PM      MDM   Final diagnoses:  Acute congestive heart failure, unspecified congestive heart failure type [facility])    Patient here for progressive lower extremity edema and dyspnea on exertion. She has hypoxia with minimal ambulation with sats to 86%.  BNP is mildly elevated. Plan to provide IV Lasix for diuresis, admitted for further treatment and her worsening dyspnea on exertion with hypoxia. Hospitalist consulted for admission and patient updated of studies and recommendation. Patient is in agreement with admission.  Quintella Reichert, MD 03/27/16 2255

## 2016-03-27 NOTE — ED Notes (Signed)
Pt walked by Physician, pt ambulated a few feet outside of the room and spo2 fell to 86% and HR at 118. Pt escorted back to room.

## 2016-03-27 NOTE — H&P (Signed)
History and Physical    Beth [name] GLO:[id number] DOB: Feb 27, 1941 DOA: 03/27/2016  Referring MD/NP/PA: PCP: Salena Saner., MD (Confirm with patient/family/NH records and if not entered, this has to be entered at [facility] point of entry) Outpatient Specialists: ([facility] speciality and name if known) Patient coming from:  Home  Chief Complaint: Dyspnea  HPI: Beth [name] is a 75 y.o. female who comes to the hospital due to dyspnea. Her symptoms have been ongoing for the last 3 weeks and progressive. Initially was only wheezing for last 48 hours has been exacerbated by dyspnea. Her shortness of breath is moderate to severe intensity, persistent, worse with exertion, no improvement factors, associated with PND, orthopnea and lower extremity edema. Last night her symptoms were severe and she came in this morning for further evaluation to the ED.  Patient's home medication regimen includes furosemide patient is not known to have heart failure.  ED Course: Patient has been given furosemide with mild improvement of her symptoms.  Review of Systems:  ENT no runny nose or sore throat. Gen. no weight gain or weightloss fever chills. Cardiovascular as mentioned in history present illness, in the history of present illness positive for orthopnea, PND and lower extremity edema. Pulmonary positive for dyspnea and wheezing no significant cough or hemoptysis Gastrointestinal no nausea vomiting or diarrhea Musculoskeletal no joint pain Dermatologic no rashes Urology no dysuria or increased urinary frequency Endocrine no tremors, heat or cold intolerance.  Past Medical History  Diagnosis Date  . Diabetes mellitus ([place])     type 2  . Hyperlipidemia   . Hypertension   . COPD (chronic obstructive pulmonary disease) ([place])     dR. wERT  . Shortness of breath     WITH EXERTION   . Asthma   . Sleep apnea     USES  CPAP   . Arthritis   . Stroke [facility])     Past Surgical History  Procedure  Laterality Date  . Tubal ligation    . Cataract extraction w/phaco Right 04/19/2013    Procedure: CATARACT EXTRACTION PHACO AND INTRAOCULAR LENS PLACEMENT (IOC);  Surgeon: Adonis Brook, MD;  Location: [place];  Service: Ophthalmology;  Laterality: Right;  . Eye surgery    . Cataract extraction w/phaco Left 05/03/2013    Procedure: CATARACT EXTRACTION PHACO AND INTRAOCULAR LENS PLACEMENT (IOC);  Surgeon: Adonis Brook, MD;  Location: [place];  Service: Ophthalmology;  Laterality: Left;     reports that she quit smoking about 11 years ago. Her smoking use included Cigarettes. She has a 60 pack-year smoking history. She has quit using smokeless tobacco. She reports that she does not drink alcohol or use illicit drugs.  Allergies  Allergen Reactions  . Aldomet [Methyldopa] Hives    Family History  Problem Relation Age of Onset  . Heart disease Mother   . Heart disease Brother   . Heart disease Brother   . Heart disease Sister   . Heart disease Sister    Past social history chronic tobacco abuse quit about 20 years ago used to smoke 1 pack per day for about 40 years  Past family history positive for diabetes heart disease and high blood pressure  Prior to Admission medications   Medication Sig Start Date End Date Taking? Authorizing Provider  albuterol (ACCUNEB) 0.63 MG/3ML nebulizer solution Take 1 ampule by nebulization every 6 (six) hours as needed for wheezing.    Yes Historical Provider, MD  albuterol (PROAIR HFA) 108 (90 BASE) MCG/ACT inhaler  2 puffs every 4 hours as needed only  if your can't catch your breath Patient taking differently: Inhale 2 puffs into the lungs every 4 (four) hours as needed for wheezing or shortness of breath. 2 puffs every 4 hours as needed only  if your can't catch your breath 04/03/14  Yes Tanda Rockers, MD  allopurinol (ZYLOPRIM) 300 MG tablet Take 300 mg by mouth daily.   Yes Historical Provider, MD  atorvastatin (LIPITOR) 40 MG tablet Take 40 mg by mouth  daily at 6 PM.   Yes Historical Provider, MD  B Complex-C (SUPER B COMPLEX/VITAMIN C) TABS Take 1 tablet by mouth daily.   Yes Historical Provider, MD  Cholecalciferol 1000 units tablet Take 1,000 Units by mouth daily.   Yes Historical Provider, MD  Coenzyme Q10 (COQ10) 200 MG CAPS Take 200 mg by mouth daily.   Yes Historical Provider, MD  Dulaglutide (TRULICITY) 6.44 IH/4.7QQ SOPN Inject 0.75 mg into the skin every Monday.   Yes Historical Provider, MD  fexofenadine (ALLEGRA) 180 MG tablet Take 180 mg by mouth daily.   Yes Historical Provider, MD  Glycopyrrolate-Formoterol (BEVESPI AEROSPHERE) 9-4.8 MCG/ACT AERO Inhale 2 puffs into the lungs 2 (two) times daily.   Yes Historical Provider, MD  meloxicam (MOBIC) 15 MG tablet Take 15 mg by mouth daily as needed for pain.   Yes Historical Provider, MD  mometasone-formoterol (DULERA) 200-5 MCG/ACT AERO Inhale 2 puffs into the lungs 2 (two) times daily.   Yes Historical Provider, MD  Omega 3 1000 MG CAPS Take 1 capsule by mouth daily.   Yes Historical Provider, MD  omeprazole (PRILOSEC) 40 MG capsule Take 40 mg by mouth daily.   Yes Historical Provider, MD  valsartan-hydrochlorothiazide (DIOVAN-HCT) 160-25 MG tablet Take 1 tablet by mouth every evening.   Yes Historical Provider, MD  budesonide-formoterol (SYMBICORT) 160-4.5 MCG/ACT inhaler INHALE 2 PUFFS FIRST THING IN THE MORNING AND THEN ANOTHER 2 PUFFS ABOUT 12 HOURS LATER 04/03/14   Tanda Rockers, MD  furosemide (LASIX) 40 MG tablet Take 1 tablet (40 mg total) by mouth daily. 12/04/13   Barton Dubois, MD  loratadine (CLARITIN) 10 MG tablet Take 1 tablet (10 mg total) by mouth daily. 12/04/13   Barton Dubois, MD  SYMBICORT 160-4.5 MCG/ACT inhaler INHALE 2 PUFFS FIRST THING IN THE MORNING AND THEN ANOTHER 2 PUFFS ABOUT 12 HOURS LATER    Tanda Rockers, MD    Physical Exam: Filed Vitals:   03/27/16 1715 03/27/16 1800 03/27/16 1815 03/27/16 1902  BP: 112/81 127/108 116/77 127/76  Pulse: 87 101 94 87    Temp:    97.7 F (36.5 C)  TempSrc:      Resp: _0 Height:    _1  (1.575 m)  Weight:    77.202 kg (170 lb 3.2 oz)  SpO2: 88% 90% 92% 90%      Constitutional: NAD, calm, comfortable Filed Vitals:   03/27/16 1715 03/27/16 1800 03/27/16 1815 03/27/16 1902  BP: 112/81 127/108 116/77 127/76  Pulse: 87 101 94 87  Temp:    97.7 F (36.5 C)  TempSrc:      Resp: _2 Height:    _3  (1.575 m)  Weight:    77.202 kg (170 lb 3.2 oz)  SpO2: 88% 90% 92% 90%   Eyes: PERRL, lids and conjunctivae Mild paleness ENMT: Mucous membranes are moist. Posterior pharynx clear of any exudate or lesions.Normal dentition.  Neck: normal, supple,  no masses, no thyromegaly, positive JVD Respiratory: Decreased breath sounds at bases, no wheezing, no crackles. Normal respiratory effort. No accessory muscle use.  Cardiovascular: Regular rate and rhythm, tachycardic, no murmurs / rubs / gallops. Etremity edema pitting+3.Marland Kitchen 2+ pedal pulses. No carotid bruits.  Abdomen: no tenderness, no masses palpated. No hepatosplenomegaly. Bowel sounds positive.  Musculoskeletal: no clubbing / cyanosis. No joint deformity upper and lower extremities. Good ROM, no contractures. Normal muscle tone.  Skin: no rashes, lesions, ulcers. No induration Neurologic: CN 2-12 grossly intact. Sensation intact, DTR normal. Strength 5/5 in all 4.  Psychiatric: Normal judgment and insight. Alert and oriented x 3. Normal mood.   (Anything < 9 systems with 2 bullets each down codes to level 1) (If patient refuses exam can't bill higher level) (Make sure to document decubitus ulcers present on admission -- if possible -- and whether patient has chronic indwelling catheter at time of admission)  Labs on Admission: I have personally reviewed following labs and imaging studies  CBC:  Recent Labs Lab 03/27/16 1328  WBC 9.3  HGB 13.8  HCT 42.6  MCV 89.9  PLT 546   Basic Metabolic Panel:  Recent Labs Lab  03/27/16 1328  NA 135  K 4.0  CL 104  CO2 20*  GLUCOSE 353*  BUN 25*  CREATININE 0.95  CALCIUM 9.4   GFR: Estimated Creatinine Clearance: 49.2 mL/min (by C-G formula based on Cr of 0.95). Liver Function Tests: No results for input(s): AST, ALT, ALKPHOS, BILITOT, PROT, ALBUMIN in the last 168 hours. No results for input(s): LIPASE, AMYLASE in the last 168 hours. No results for input(s): AMMONIA in the last 168 hours. Coagulation Profile: No results for input(s): INR, PROTIME in the last 168 hours. Cardiac Enzymes: No results for input(s): CKTOTAL, CKMB, CKMBINDEX, TROPONINI in the last 168 hours. BNP (last 3 results) No results for input(s): PROBNP in the last 8760 hours. HbA1C: No results for input(s): HGBA1C in the last 72 hours. CBG: No results for input(s): GLUCAP in the last 168 hours. Lipid Profile: No results for input(s): CHOL, HDL, LDLCALC, TRIG, CHOLHDL, LDLDIRECT in the last 72 hours. Thyroid Function Tests: No results for input(s): TSH, T4TOTAL, FREET4, T3FREE, THYROIDAB in the last 72 hours. Anemia Panel: No results for input(s): VITAMINB12, FOLATE, FERRITIN, TIBC, IRON, RETICCTPCT in the last 72 hours. Urine analysis: No results found for: COLORURINE, APPEARANCEUR, LABSPEC, PHURINE, GLUCOSEU, HGBUR, BILIRUBINUR, KETONESUR, PROTEINUR, UROBILINOGEN, NITRITE, LEUKOCYTESUR Sepsis Labs: _0 (procalcitonin:4,lacticidven:4) )No results found for this or any previous visit (from the past 240 hour(s)).   Radiological Exams on Admission: Dg Chest 2 View  03/27/2016  CLINICAL DATA:  Shortness of breath for 3 weeks. Chest tightness for 2 weeks EXAM: CHEST  2 VIEW COMPARISON:  Mar 16, 2016 FINDINGS: There is no edema or consolidation. There is slight scarring in the bases bilaterally. Heart is upper normal in size with pulmonary vascularity within normal limits. There is atherosclerotic calcification in the aortic arch region. No adenopathy. No bone lesions. There are  scattered foci of carotid artery calcification bilaterally. IMPRESSION: Slight bibasilar scarring. No edema or consolidation. Scattered foci of carotid artery calcification bilaterally. Electronically Signed   By: Lowella Grip III M.D.   On: 03/27/2016 13:55   Personally reviewed. AP film which is rotated to the right side, has good inspiration and good penetration. Moderate globular heart, increased pulmonary vasculature bilaterally with cephalization of the vasculature, no effusions or signs of pneumothorax.   EKG: Independently reviewed. Sinus rhythm with a  inferior T-wave inversions, right bundle branch block, no ST elevations or ST depressions.  Assessment/Plan Active Problems:   CHF (congestive heart failure) ([place])   Heart failure ([place])  (please populate well all problems here in Problem List. (For example, if patient is on BP meds at home and you resume or decide to hold them, it is a problem that needs to be her. Same for CAD, COPD, HLD and so on)   This is a 75 year old female who presents to the hospital with chief complaint of dyspnea, peripheral last 48 hours her symptoms have been more severe consistent with PND orthopnea and lower extremity edema. She uses Lasix at home but admits been noncompliant with a sodium restricted diet. He has been not told to have heart failure. On the physical examination she looks volume overloaded with a JVD, basilar Rales, lower extremity edema. Creatinine is 0.95, potassium 4.0, no leukocytosis. Her EKG showing a right bundle branch block with inferior T-wave inversions her chest and was showing pulmonary edema suggested to be cardiogenic in origin considering her cardiomegaly and globular heart.  Working diagnosis: Dyspnea due to pulmonary edema due to suspected diastolic heart failure acute on chronic due to dietary discretion and possible uncontrolled hypertension.  1. Cardiovascular. Patient will be diuresis with furosemide 40 mg intravenously  twice daily, strict in and out's and daily weights, will continue aspirin, atorvastatin, valsartan, hydrochlorothiazide and will add metoprolol 25 g twice daily. Will order an echocardiogram to assess LV function.  2. Pulmonary. Dyspnea due to cardiogenic pulmonary edema. Patient will be diuresis with furosemide intravenously, will continue to monitor oximetry, supplemental oxygen per nasal cannula to target O2 sat duration more than 92%. Continue bronchodilator therapy with albuterol ipratropium  3. Nephrology. Patient creatinine 0.95 potassium 4.0, will continue close monitoring kidney function and electrolytes. Patient will be diuresis with furosemide. Following urinary output.  4. DVT prophylaxis  Patient is high risk of developing worsening heart failure and cardiogenic pulmonary edema.    DVT prophylaxis:  Heparin subcutaneously (Lovenox/Heparin/SCD's/anticoagulated/None (if comfort care) Code Status: Full code (Full/Partial (specify details) Family Communication: Family at bedside explaining patient's condition and plan. (Specify name, relationship. Do not write "discussed with patient". Specify tel # if discussed over the phone) Disposition Plan:  (specify when and where you expect patient to be discharged) Consults called:  (with names) Admission status: Inpatient (inpatient / obs / tele / medical floor / SDU)   Mauricio Gerome Apley MD [facility]   If 7PM-7AM, please contact night-coverage [email or web address] Password [facility]  03/27/2016, 7:06 PM

## 2016-03-27 NOTE — ED Notes (Signed)
PT reports to the ED for eval of SOB. States this started with allergies and asthma x 3 weeks. States she was given medication and her other symptoms decreased however, she continues to have SOB. Denies any CP. Does have some BLE swelling that began x 1 week ago. States they swell more in the daytime and when she wakes up they are not swollen. Reports some orthopnea. Using 2 pillows which is one more than normal. Pt A&Ox4, resp e/u, and skin warm and dry.

## 2016-03-27 NOTE — ED Notes (Signed)
Attempted to call report

## 2016-03-27 NOTE — ED Notes (Signed)
MD at bedside.

## 2016-03-27 NOTE — ED Notes (Signed)
Pt provided with bedside commode in room. Family to help patient to use bathroom .

## 2016-03-28 ENCOUNTER — Encounter (HOSPITAL_COMMUNITY): Payer: Self-pay | Admitting: *Deleted

## 2016-03-28 ENCOUNTER — Inpatient Hospital Stay (HOSPITAL_COMMUNITY): Payer: Medicare HMO

## 2016-03-28 DIAGNOSIS — R0600 Dyspnea, unspecified: Secondary | ICD-10-CM

## 2016-03-28 LAB — COMPREHENSIVE METABOLIC PANEL
ALK PHOS: 86 U/L (ref 38–126)
ALT: 124 U/L — AB (ref 14–54)
AST: 43 U/L — AB (ref 15–41)
Albumin: 2.8 g/dL — ABNORMAL LOW (ref 3.5–5.0)
Anion gap: 13 (ref 5–15)
BUN: 33 mg/dL — AB (ref 6–20)
CALCIUM: 9.1 mg/dL (ref 8.9–10.3)
CHLORIDE: 100 mmol/L — AB (ref 101–111)
CO2: 26 mmol/L (ref 22–32)
CREATININE: 1.36 mg/dL — AB (ref 0.44–1.00)
GFR calc non Af Amer: 37 mL/min — ABNORMAL LOW (ref >60)
GFR, EST AFRICAN AMERICAN: 43 mL/min — AB (ref >60)
GLUCOSE: 323 mg/dL — AB (ref 65–99)
Potassium: 4.4 mmol/L (ref 3.5–5.1)
SODIUM: 139 mmol/L (ref 135–145)
Total Bilirubin: 0.7 mg/dL (ref 0.3–1.2)
Total Protein: 5 g/dL — ABNORMAL LOW (ref 6.5–8.1)

## 2016-03-28 LAB — GLUCOSE, CAPILLARY
Glucose-Capillary: 308 mg/dL — ABNORMAL HIGH (ref 65–99)
Glucose-Capillary: 146 mg/dL — ABNORMAL HIGH (ref 65–99)
Glucose-Capillary: 366 mg/dL — ABNORMAL HIGH (ref 65–99)
Glucose-Capillary: 157 mg/dL — ABNORMAL HIGH (ref 65–99)

## 2016-03-28 LAB — BASIC METABOLIC PANEL
Anion gap: 10 (ref 5–15)
BUN: 35 mg/dL — ABNORMAL HIGH (ref 6–20)
CO2: 23 mmol/L (ref 22–32)
Calcium: 8.9 mg/dL (ref 8.9–10.3)
Chloride: 103 mmol/L (ref 101–111)
Creatinine, Ser: 1.34 mg/dL — ABNORMAL HIGH (ref 0.44–1.00)
GFR calc Af Amer: 44 mL/min — ABNORMAL LOW (ref >60)
GFR calc non Af Amer: 38 mL/min — ABNORMAL LOW (ref >60)
Glucose, Bld: 195 mg/dL — ABNORMAL HIGH (ref 65–99)
Potassium: 5.2 mmol/L — ABNORMAL HIGH (ref 3.5–5.1)
Sodium: 136 mmol/L (ref 135–145)

## 2016-03-28 LAB — ECHOCARDIOGRAM COMPLETE
Height: 62 in
Weight: 2702.4 oz

## 2016-03-28 MED ORDER — PERFLUTREN LIPID MICROSPHERE
1.0000 mL | INTRAVENOUS | Status: AC | PRN
  Administered 2016-03-28: 2 mL via INTRAVENOUS
  Filled 2016-03-28: qty 10

## 2016-03-28 MED ORDER — FUROSEMIDE 10 MG/ML IJ SOLN
40.0000 mg | Freq: Every day | INTRAMUSCULAR | Status: DC
  Administered 2016-03-29: 40 mg via INTRAVENOUS
  Filled 2016-03-28: qty 4

## 2016-03-28 MED ORDER — INSULIN ASPART 100 UNIT/ML ~~LOC~~ SOLN
10.0000 [IU] | Freq: Once | SUBCUTANEOUS | Status: AC
  Administered 2016-03-28: 10 [IU] via SUBCUTANEOUS

## 2016-03-28 MED ORDER — FUROSEMIDE 10 MG/ML IJ SOLN
40.0000 mg | Freq: Every day | INTRAMUSCULAR | Status: DC
  Filled 2016-03-28: qty 4

## 2016-03-28 MED ORDER — INSULIN ASPART 100 UNIT/ML ~~LOC~~ SOLN
0.0000 [IU] | Freq: Three times a day (TID) | SUBCUTANEOUS | Status: DC
  Administered 2016-03-28: 1 [IU] via SUBCUTANEOUS
  Administered 2016-03-28: 9 [IU] via SUBCUTANEOUS
  Administered 2016-03-29: 7 [IU] via SUBCUTANEOUS
  Administered 2016-03-29: 9 [IU] via SUBCUTANEOUS
  Administered 2016-03-29 – 2016-03-30 (×2): 3 [IU] via SUBCUTANEOUS
  Administered 2016-03-30 – 2016-03-31 (×3): 7 [IU] via SUBCUTANEOUS
  Administered 2016-03-31: 3 [IU] via SUBCUTANEOUS
  Administered 2016-04-01: 2 [IU] via SUBCUTANEOUS
  Administered 2016-04-01: 9 [IU] via SUBCUTANEOUS
  Administered 2016-04-01: 3 [IU] via SUBCUTANEOUS
  Administered 2016-04-02: 2 [IU] via SUBCUTANEOUS
  Administered 2016-04-02 – 2016-04-03 (×3): 3 [IU] via SUBCUTANEOUS
  Administered 2016-04-03: 5 [IU] via SUBCUTANEOUS
  Administered 2016-04-04: 7 [IU] via SUBCUTANEOUS
  Administered 2016-04-04: 5 [IU] via SUBCUTANEOUS
  Administered 2016-04-04 – 2016-04-05 (×2): 9 [IU] via SUBCUTANEOUS
  Administered 2016-04-05: 7 [IU] via SUBCUTANEOUS
  Administered 2016-04-05: 3 [IU] via SUBCUTANEOUS
  Administered 2016-04-06: 2 [IU] via SUBCUTANEOUS
  Administered 2016-04-06: 3 [IU] via SUBCUTANEOUS

## 2016-03-28 MED ORDER — PERFLUTREN LIPID MICROSPHERE
INTRAVENOUS | Status: AC
Start: 2016-03-28 — End: 2016-03-28
  Administered 2016-03-28: 2 mL via INTRAVENOUS
  Filled 2016-03-28: qty 10

## 2016-03-28 NOTE — Progress Notes (Signed)
PROGRESS NOTE    Beth [name]  GHW:[id number] DOB: 10-08-1941 DOA: 03/27/2016 PCP: Salena Saner., MD (Confirm with patient/family/NH records and if not entered, this HAS to be entered at [facility] point of entry. "No PCP" if truly none.) Outpatient Specialists: Theme park manager speciality and name if known)    Brief Narrative: (Start on day 1 of progress note - keep it brief and live) Patient with new diagnosis of chf. Responding well to diuresis   Assessment & Plan:   Active Problems:   CHF (congestive heart failure) (HCC)   Heart failure (HCC)   Acute pulmonary edema (HCC)   Essential hypertension   Dyslipidemia   1. Cardiovascular. Patient with negative fluid balance, -1840 cc since admission, will continue diuresis with furosemide, will continue beta blockade with metoprolol, blood pressure control with irbesartan. Blood pressure systolic 95 to 789. Will hold on HCTZ and will decrease furosemide to po daily. Follow on echocardiogram. Clinically more euvolemic.   2. Pulmonary. Cardiogenic pulmonary edema, will continue gentle diuresis, continue to monitor oxymetry and supplemental 02 per [place], to target 02 sat above 92%  3. Nephrology, Renal function with cr at 1.3 stable K at 5.2, will follow on renal panel in am, negative fluid balance as above. Avoid hypotension and nephrotoxic meds, will hold on hctz and will change furosemide to po.  4. Endocrinology. Will hold on metformin for now, will continue glucose cover with insulin sliding scale.    DVT prophylaxis:  heparin Code Status:  Family Communication:  With daughter at bedside Disposition Plan:  Home in 24 hrs  Consultants:     Procedures: (Don't include imaging studies which can be auto populated. Include things that cannot be auto populated i.e. Echo, Carotid and venous dopplers, Foley, Bipap, HD, tubes/drains, wound vac, central lines etc)    Antimicrobials: (specify start and planned stop date. Auto populated tables  are space occupying and do not give end dates)      Subjective: Dyspnea has improved, mild in intensity. No chest pain or palpitations. Lower extremities edema improved but still persistent. No nausea, vomiting or diarrhea.   Objective: Filed Vitals:   03/27/16 1902 03/28/16 0236 03/28/16 0531 03/28/16 0545  BP: 127/76 92/64  115/79  Pulse: 87 70  77  Temp: 97.7 F (36.5 C) 98.8 F (37.1 C)  97.9 F (36.6 C)  TempSrc:  Oral  Oral  Resp: _0 Height: _1  (1.575 m)     Weight: 77.202 kg (170 lb 3.2 oz)  76.613 kg (168 lb 14.4 oz)   SpO2: 90% 95%  94%    Intake/Output Summary (Last 24 hours) at 03/28/16 0829 Last data filed at 03/28/16 0817  Gross per 24 hour  Intake    120 ml  Output   2200 ml  Net  -2080 ml   Filed Weights   03/27/16 1323 03/27/16 1902 03/28/16 0531  Weight: 79.652 kg (175 lb 9.6 oz) 77.202 kg (170 lb 3.2 oz) 76.613 kg (168 lb 14.4 oz)    Examination:  General exam: No in pain or dyspnea. E-ENT: conjunctiva with no pallor or icterus, oral mucosa moist. Respiratory system: Decreased breath sounds at bases, with no significant wheezing, rales or rhonchi.  Cardiovascular system: S1 & S2 heard, RRR. No JVD, murmurs, rubs, gallops or clicks. Positive pedal edema.++ pitting. Gastrointestinal system: Abdomen is nondistended, soft and nontender. No organomegaly or masses felt. Normal bowel sounds heard. Central nervous system: Alert and oriented. No focal neurological deficits. Extremities:  Symmetric 5 x 5 power. Skin: No rashes, lesions or ulcers Psychiatry: Judgement and insight appear normal. Mood & affect appropriate.     Data Reviewed: I have personally reviewed following labs and imaging studies  CBC:  Recent Labs Lab 03/27/16 1328 03/27/16 1935  WBC 9.3 7.4  HGB 13.8 13.7  HCT 42.6 42.4  MCV 89.9 89.6  PLT 258 974   Basic Metabolic Panel:  Recent Labs Lab 03/27/16 1328 03/27/16 1935 03/28/16 0554  NA 135  --  139  K 4.0   --  4.4  CL 104  --  100*  CO2 20*  --  26  GLUCOSE 353*  --  323*  BUN 25*  --  33*  CREATININE 0.95 0.86 1.36*  CALCIUM 9.4  --  9.1   GFR: Estimated Creatinine Clearance: 34.2 mL/min (by C-G formula based on Cr of 1.36). Liver Function Tests:  Recent Labs Lab 03/28/16 0554  AST 43*  ALT 124*  ALKPHOS 86  BILITOT 0.7  PROT 5.0*  ALBUMIN 2.8*   No results for input(s): LIPASE, AMYLASE in the last 168 hours. No results for input(s): AMMONIA in the last 168 hours. Coagulation Profile: No results for input(s): INR, PROTIME in the last 168 hours. Cardiac Enzymes: No results for input(s): CKTOTAL, CKMB, CKMBINDEX, TROPONINI in the last 168 hours. BNP (last 3 results) No results for input(s): PROBNP in the last 8760 hours. HbA1C: No results for input(s): HGBA1C in the last 72 hours. CBG:  Recent Labs Lab 03/27/16 2057 03/28/16 0541  GLUCAP 247* 308*   Lipid Profile: No results for input(s): CHOL, HDL, LDLCALC, TRIG, CHOLHDL, LDLDIRECT in the last 72 hours. Thyroid Function Tests: No results for input(s): TSH, T4TOTAL, FREET4, T3FREE, THYROIDAB in the last 72 hours. Anemia Panel: No results for input(s): VITAMINB12, FOLATE, FERRITIN, TIBC, IRON, RETICCTPCT in the last 72 hours. Urine analysis: No results found for: COLORURINE, APPEARANCEUR, LABSPEC, PHURINE, GLUCOSEU, HGBUR, BILIRUBINUR, KETONESUR, PROTEINUR, UROBILINOGEN, NITRITE, LEUKOCYTESUR Sepsis Labs: No results for input(s): PROCALCITON, LATICACIDVEN in the last 168 hours.  No results found for this or any previous visit (from the past 240 hour(s)).       Radiology Studies: Dg Chest 2 View  03/27/2016  CLINICAL DATA:  Shortness of breath for 3 weeks. Chest tightness for 2 weeks EXAM: CHEST  2 VIEW COMPARISON:  Mar 16, 2016 FINDINGS: There is no edema or consolidation. There is slight scarring in the bases bilaterally. Heart is upper normal in size with pulmonary vascularity within normal limits. There is  atherosclerotic calcification in the aortic arch region. No adenopathy. No bone lesions. There are scattered foci of carotid artery calcification bilaterally. IMPRESSION: Slight bibasilar scarring. No edema or consolidation. Scattered foci of carotid artery calcification bilaterally. Electronically Signed   By: Lowella Grip III M.D.   On: 03/27/2016 13:55        Scheduled Meds: . allopurinol  300 mg Oral Daily  . aspirin EC  81 mg Oral Daily  . atorvastatin  40 mg Oral q1800  . [START ON 03/29/2016] furosemide  40 mg Intravenous Daily  . heparin  5,000 Units Subcutaneous Q8H  . irbesartan  150 mg Oral QPM   And  . hydrochlorothiazide  25 mg Oral QPM  . ipratropium-albuterol  3 mL Nebulization TID  . loratadine  10 mg Oral Daily  . metoprolol tartrate  25 mg Oral Q12H  . mometasone-formoterol  2 puff Inhalation BID  . omega-3 acid ethyl esters  1 g  Oral Daily  . pantoprazole  40 mg Oral Daily  . sodium chloride flush  3 mL Intravenous Q12H  . sodium chloride flush  3 mL Intravenous Q12H   Continuous Infusions:    LOS: 1 day        Mauricio Gerome Apley, MD [facility] Pager [phone]  If 7PM-7AM, please contact night-coverage [email or web address] Password [facility] 03/28/2016, 8:29 AM

## 2016-03-28 NOTE — Progress Notes (Signed)
Echocardiogram 2D Echocardiogram has been performed.  Aggie Cosier 03/28/2016, 9:18 AM

## 2016-03-29 DIAGNOSIS — I952 Hypotension due to drugs: Secondary | ICD-10-CM

## 2016-03-29 DIAGNOSIS — N179 Acute kidney failure, unspecified: Secondary | ICD-10-CM

## 2016-03-29 LAB — BASIC METABOLIC PANEL
ANION GAP: 12 (ref 5–15)
BUN: 56 mg/dL — ABNORMAL HIGH (ref 6–20)
CALCIUM: 8.4 mg/dL — AB (ref 8.9–10.3)
CO2: 24 mmol/L (ref 22–32)
CREATININE: 1.7 mg/dL — AB (ref 0.44–1.00)
Chloride: 103 mmol/L (ref 101–111)
GFR, EST AFRICAN AMERICAN: 33 mL/min — AB (ref >60)
GFR, EST NON AFRICAN AMERICAN: 28 mL/min — AB (ref >60)
GLUCOSE: 215 mg/dL — AB (ref 65–99)
Potassium: 5.2 mmol/L — ABNORMAL HIGH (ref 3.5–5.1)
Sodium: 139 mmol/L (ref 135–145)

## 2016-03-29 LAB — GLUCOSE, CAPILLARY
GLUCOSE-CAPILLARY: 219 mg/dL — AB (ref 65–99)
GLUCOSE-CAPILLARY: 326 mg/dL — AB (ref 65–99)
GLUCOSE-CAPILLARY: 282 mg/dL — AB (ref 65–99)
Glucose-Capillary: 477 mg/dL — ABNORMAL HIGH (ref 65–99)

## 2016-03-29 MED ORDER — IPRATROPIUM-ALBUTEROL 0.5-2.5 (3) MG/3ML IN SOLN: 3.0000 mL | RESPIRATORY_TRACT | Status: DC | PRN

## 2016-03-29 NOTE — Progress Notes (Signed)
PROGRESS NOTE    Beth [name]  FWY:[id number] DOB: [birth year] DOA: 03/27/2016 PCP: Salena Saner., MD (Confirm with patient/family/NH records and if not entered, this HAS to be entered at [facility] point of entry. "No PCP" if truly none.) Outpatient Specialists: Theme park manager speciality and name if known)    Brief Narrative: (Start on day 1 of progress note - keep it brief and live) Patient with new diagnosis of chf. Responding well to diuresis  Assessment & Plan:   Active Problems:   CHF (congestive heart failure) (HCC)   Heart failure (HCC)   Acute pulmonary edema (HCC)   Essential hypertension   Dyslipidemia   1. Cardiovascular. Patient with negative fluid balance, noted worsening cr at episodes of hypotension will hold on furosemide but will continue beta blockade with metoprolol, will hold on arb due to risk for worsening hypotension. Blood pressure systolic 92 to 99. Echocardiogram with normal systolic function but positive signs of diastolic dysfunction.   2. Pulmonary. Improved edema, will continue to follow up on oxymetry. Continue dounebs as needed for bronchodilator therapy.  3. Nephrology, Renal function with worsening cr at 1.70 stable K at 5.2, will follow on renal panel in am, will hold on further diuresis, avoid hypotension.   4. Endocrinology. Will hold on metformin for now, will continue glucose cover with insulin sliding scale, serum glucose 215,   DVT prophylaxis: (Lovenox/Heparin/SCD's/anticoagulated/None (if comfort care) Code Status: (Full/Partial - specify details) Family Communication: (Specify name, relationship & date discussed. NO "discussed with patient") Disposition Plan: (specify when and where you expect patient to be discharged)   Consultants:     Procedures: (Don't include imaging studies which can be auto populated. Include things that cannot be auto populated i.e. Echo, Carotid and venous dopplers, Foley, Bipap, HD, tubes/drains, wound vac,  central lines etc)   Antimicrobials: (specify start and planned stop date. Auto populated tables are space occupying and do not give end dates)     Subjective: Patient with improved dyspnea, but still persistent, worse with exertion, improved lower ext. Edema. No chest pain, nausea or vomiting. Family at bedside.  Objective: Filed Vitals:   03/28/16 2032 03/28/16 2051 03/29/16 0149 03/29/16 0500  BP: 98/59  110/64 99/59  Pulse: 60 64 74   Temp: 98.6 F (37 C)  98.2 F (36.8 C) 97.9 F (36.6 C)  TempSrc: Oral  Oral Oral  Resp: _0 Height:      Weight:    77.61 kg (171 lb 1.6 oz)  SpO2: 100% 97% 96% 95%    Intake/Output Summary (Last 24 hours) at 03/29/16 1059 Last data filed at 03/29/16 0900  Gross per 24 hour  Intake    440 ml  Output    350 ml  Net     90 ml   Filed Weights   03/27/16 1902 03/28/16 0531 03/29/16 0500  Weight: 77.202 kg (170 lb 3.2 oz) 76.613 kg (168 lb 14.4 oz) 77.61 kg (171 lb 1.6 oz)    Examination:  General exam: Not in pain or dyspnea. E ENT: no conjunctival pallor, oral mucosa moist. Respiratory system: Bibasilar rales, no wheezing or rhonchi. Respiratory effort normal. Cardiovascular system: S1 & S2 heard, RRR. No JVD, murmurs, rubs, gallops or clicks. Pitting edema + edema, bilateral and symmetric. Gastrointestinal system: Abdomen is nondistended, soft and nontender. No organomegaly or masses felt. Normal bowel sounds heard. Central nervous system: Alert and oriented. No focal neurological deficits. Extremities: Symmetric 5 x 5 power. Skin: No rashes,  lesions or ulcers Psychiatry: Judgement and insight appear normal. Mood & affect appropriate.     Data Reviewed: I have personally reviewed following labs and imaging studies  CBC:  Recent Labs Lab 03/27/16 1328 03/27/16 1935  WBC 9.3 7.4  HGB 13.8 13.7  HCT 42.6 42.4  MCV 89.9 89.6  PLT 258 678   Basic Metabolic Panel:  Recent Labs Lab 03/27/16 1328 03/27/16 1935  03/28/16 0554 03/28/16 0927 03/29/16 0423  NA 135  --  139 136 139  K 4.0  --  4.4 5.2* 5.2*  CL 104  --  100* 103 103  CO2 20*  --  _0 GLUCOSE 353*  --  323* 195* 215*  BUN 25*  --  33* 35* 56*  CREATININE 0.95 0.86 1.36* 1.34* 1.70*  CALCIUM 9.4  --  9.1 8.9 8.4*   GFR: Estimated Creatinine Clearance: 27.6 mL/min (by C-G formula based on Cr of 1.7). Liver Function Tests:  Recent Labs Lab 03/28/16 0554  AST 43*  ALT 124*  ALKPHOS 86  BILITOT 0.7  PROT 5.0*  ALBUMIN 2.8*   No results for input(s): LIPASE, AMYLASE in the last 168 hours. No results for input(s): AMMONIA in the last 168 hours. Coagulation Profile: No results for input(s): INR, PROTIME in the last 168 hours. Cardiac Enzymes: No results for input(s): CKTOTAL, CKMB, CKMBINDEX, TROPONINI in the last 168 hours. BNP (last 3 results) No results for input(s): PROBNP in the last 8760 hours. HbA1C: No results for input(s): HGBA1C in the last 72 hours. CBG:  Recent Labs Lab 03/28/16 0541 03/28/16 1106 03/28/16 1707 03/28/16 2227 03/29/16 0612  GLUCAP 308* 146* 366* 157* 219*   Lipid Profile: No results for input(s): CHOL, HDL, LDLCALC, TRIG, CHOLHDL, LDLDIRECT in the last 72 hours. Thyroid Function Tests: No results for input(s): TSH, T4TOTAL, FREET4, T3FREE, THYROIDAB in the last 72 hours. Anemia Panel: No results for input(s): VITAMINB12, FOLATE, FERRITIN, TIBC, IRON, RETICCTPCT in the last 72 hours. Urine analysis: No results found for: COLORURINE, APPEARANCEUR, LABSPEC, PHURINE, GLUCOSEU, HGBUR, BILIRUBINUR, KETONESUR, PROTEINUR, UROBILINOGEN, NITRITE, LEUKOCYTESUR Sepsis Labs: No results for input(s): PROCALCITON, LATICACIDVEN in the last 168 hours.  No results found for this or any previous visit (from the past 240 hour(s)).       Radiology Studies: Dg Chest 2 View  03/27/2016  CLINICAL DATA:  Shortness of breath for 3 weeks. Chest tightness for 2 weeks EXAM: CHEST  2 VIEW COMPARISON:   Mar 16, 2016 FINDINGS: There is no edema or consolidation. There is slight scarring in the bases bilaterally. Heart is upper normal in size with pulmonary vascularity within normal limits. There is atherosclerotic calcification in the aortic arch region. No adenopathy. No bone lesions. There are scattered foci of carotid artery calcification bilaterally. IMPRESSION: Slight bibasilar scarring. No edema or consolidation. Scattered foci of carotid artery calcification bilaterally. Electronically Signed   By: Lowella Grip III M.D.   On: 03/27/2016 13:55        Scheduled Meds: . allopurinol  300 mg Oral Daily  . aspirin EC  81 mg Oral Daily  . atorvastatin  40 mg Oral q1800  . heparin  5,000 Units Subcutaneous Q8H  . insulin aspart  0-9 Units Subcutaneous TID WC  . loratadine  10 mg Oral Daily  . metoprolol tartrate  25 mg Oral Q12H  . mometasone-formoterol  2 puff Inhalation BID  . omega-3 acid ethyl esters  1 g Oral Daily  . pantoprazole  40  mg Oral Daily  . sodium chloride flush  3 mL Intravenous Q12H  . sodium chloride flush  3 mL Intravenous Q12H   Continuous Infusions:    LOS: 2 days        Mauricio Gerome Apley, MD [facility] Pager [phone]  If 7PM-7AM, please contact night-coverage [email or web address] Password [email or web address] 03/29/2016, 10:59 AM

## 2016-03-30 LAB — HEMOGLOBIN A1C
HEMOGLOBIN A1C: 11.2 % — AB (ref 4.8–5.6)
Mean Plasma Glucose: 275 mg/dL

## 2016-03-30 LAB — BASIC METABOLIC PANEL
Anion gap: 7 (ref 5–15)
BUN: 64 mg/dL — ABNORMAL HIGH (ref 6–20)
CALCIUM: 8.2 mg/dL — AB (ref 8.9–10.3)
CHLORIDE: 100 mmol/L — AB (ref 101–111)
CO2: 26 mmol/L (ref 22–32)
CREATININE: 1.42 mg/dL — AB (ref 0.44–1.00)
GFR calc non Af Amer: 35 mL/min — ABNORMAL LOW (ref >60)
GFR, EST AFRICAN AMERICAN: 41 mL/min — AB (ref >60)
Glucose, Bld: 338 mg/dL — ABNORMAL HIGH (ref 65–99)
Potassium: 5 mmol/L (ref 3.5–5.1)
SODIUM: 133 mmol/L — AB (ref 135–145)

## 2016-03-30 LAB — GLUCOSE, CAPILLARY
GLUCOSE-CAPILLARY: 333 mg/dL — AB (ref 65–99)
GLUCOSE-CAPILLARY: 335 mg/dL — AB (ref 65–99)
Glucose-Capillary: 409 mg/dL — ABNORMAL HIGH (ref 65–99)
Glucose-Capillary: 203 mg/dL — ABNORMAL HIGH (ref 65–99)
Glucose-Capillary: 146 mg/dL — ABNORMAL HIGH (ref 65–99)

## 2016-03-30 MED ORDER — METOPROLOL TARTRATE 12.5 MG HALF TABLET
12.5000 mg | ORAL_TABLET | Freq: Two times a day (BID) | ORAL | Status: DC
  Administered 2016-03-31 – 2016-04-02 (×5): 12.5 mg via ORAL
  Filled 2016-03-30 (×5): qty 1

## 2016-03-30 MED ORDER — INSULIN ASPART 100 UNIT/ML ~~LOC~~ SOLN
10.0000 [IU] | Freq: Once | SUBCUTANEOUS | Status: AC
  Administered 2016-03-30: 10 [IU] via SUBCUTANEOUS

## 2016-03-30 MED ORDER — SODIUM POLYSTYRENE SULFONATE 15 GM/60ML PO SUSP
30.0000 g | Freq: Once | ORAL | Status: AC
  Administered 2016-03-30: 30 g via ORAL
  Filled 2016-03-30: qty 120

## 2016-03-30 NOTE — Progress Notes (Signed)
Inpatient Diabetes Program Recommendations  AACE/ADA: New Consensus Statement on Inpatient Glycemic Control (2015)  Target Ranges:  Prepandial:   less than 140 mg/dL      Peak postprandial:   less than 180 mg/dL (1-2 hours)      Critically ill patients:  140 - 180 mg/dL   Review of Glycemic Control  Inpatient Diabetes Program Recommendations:  Insulin - Basal: add Lantus 15 units daily Thank you  Raoul Pitch BSN, RN,CDE Inpatient Diabetes Coordinator [phone] (team pager)

## 2016-03-30 NOTE — Evaluation (Signed)
Physical Therapy Evaluation Patient Details Name: Beth [name] MRN: [id number] DOB: 09/10/1941 Today's Date: 03/30/2016   History of Present Illness  Pt is a 75 y/o F who presented to the hospital w/ dyspnea and found to have pulmonary edema.  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     Clinical Impression  Pt admitted with above diagnosis. Pt currently with functional limitations due to the deficits listed below (see PT Problem List). Beth [name] presents w/ main limitation of cardiopulmonary status.  She requires ~4 minute standing rest break to ambulate 80 ft due to increased WOB and SOB.  SpO2 drops to 88% on RA and remains at or above 90% on 2L O2.  Anticipate that once pt's cardiopulmonary status improves, her mobility will as well.  Strongly recommend Cardiopulmonary Rehab. Pt will benefit from skilled PT to increase their independence and safety with mobility to allow discharge to the venue listed below.      Follow Up Recommendations Home health PT;Supervision - Intermittent    Equipment Recommendations  Rolling walker with 5" wheels (for energy conservation)    Recommendations for Other Services Other (comment) (Cardiopulmonary Rehab)     Precautions / Restrictions Precautions Precautions: Fall Precaution Comments: monitor O2 Restrictions Weight Bearing Restrictions: No      Mobility  Bed Mobility               General bed mobility comments: Pt sitting EOB upon PT arrival  Transfers Overall transfer level: Needs assistance Equipment used: 1 person hand held assist;None Transfers: Sit to/from Stand Sit to Stand: Min assist;Min guard         General transfer comment: 1 person HHA to steady w/ fist sit>stand from bed.  Close min guard for second sit>stand as pt unsteady.    Ambulation/Gait Ambulation/Gait assistance: Min guard Ambulation Distance (Feet): 80 Feet Assistive device: None Gait  Pattern/deviations: Step-through pattern;Decreased stride length   Gait velocity interpretation: Below normal speed for age/gender General Gait Details: Close min guard assist as pt fatigues quickly due to increased WOB.  SpO2 drops to 88% on RA and [place] applied w/ O2 2LPM w/ SpO2 remaining at or above 90%.  Requires 1 standing rest break last ~4 minutes due to SOB.   Stairs            Wheelchair Mobility    Modified Rankin (Stroke Patients Only)       Balance Overall balance assessment: Needs assistance (denies h/o falls) Sitting-balance support: No upper extremity supported;Feet supported Sitting balance-Leahy Scale: Fair     Standing balance support: No upper extremity supported;During functional activity Standing balance-Leahy Scale: Fair                               Pertinent Vitals/Pain Pain Assessment: No/denies pain    Home Living Family/patient expects to be discharged to:: Private residence Living Arrangements: Alone Available Help at Discharge: Family;Available PRN/intermittently (daughter lives down the street but works (is a SW)) Type of Home: House Home Access: Level entry     Home Layout: One level Home Equipment: None      Prior Function Level of Independence: Independent         Comments: Not using AD, lives alone. Denies h/o falls in the past 6 months.     Hand Dominance   Dominant Hand: Right    Extremity/Trunk Assessment   Upper  Extremity Assessment: Defer to OT evaluation           Lower Extremity Assessment: Overall WFL for tasks assessed         Communication   Communication: No difficulties  Cognition Arousal/Alertness: Awake/alert Behavior During Therapy: WFL for tasks assessed/performed Overall Cognitive Status: Within Functional Limits for tasks assessed                      General Comments General comments (skin integrity, edema, etc.): Pt using accessory muscle due to increased WOB.  Pt  says her and her daughter are unable to think of anyone who can provide supervision at d/c as everyone she know is working.    Exercises        Assessment/Plan    PT Assessment Patient needs continued PT services  PT Diagnosis Difficulty walking   PT Problem List Decreased activity tolerance;Decreased balance;Decreased safety awareness;Decreased knowledge of precautions;Cardiopulmonary status limiting activity  PT Treatment Interventions DME instruction;Gait training;Functional mobility training;Therapeutic activities;Therapeutic exercise;Balance training;Patient/family education   PT Goals (Current goals can be found in the Care Plan section) Acute Rehab PT Goals Patient Stated Goal: to improve her breathing PT Goal Formulation: With patient Time For Goal Achievement: 04/13/16 Potential to Achieve Goals: Good    Frequency Min 3X/week   Barriers to discharge Decreased caregiver support Lives alone    Co-evaluation               End of Session Equipment Utilized During Treatment: Gait belt;Oxygen Activity Tolerance: Treatment limited secondary to medical complications (Comment);Patient limited by fatigue (hypoxia) Patient left: in chair;with call bell/phone within reach;with chair alarm set;with family/visitor present Nurse Communication: Mobility status;Other (comment) (SpO2)         Time: 8177-1165 PT Time Calculation (min) (ACUTE ONLY): 30 min   Charges:   PT Evaluation $PT Eval Low Complexity: 1 Procedure PT Treatments $Gait Training: 8-22 mins   PT G Codes:       Beth [name] PT, DPT  Pager: [phone] Phone: [phone] 03/30/2016, 2:26 PM

## 2016-03-30 NOTE — Progress Notes (Signed)
SATURATION QUALIFICATIONS: (This note is used to comply with regulatory documentation for home oxygen)  Patient Saturations on Room Air at Rest = 93%  Patient Saturations on Room Air while Ambulating = 88%  Patient Saturations on 2 Liters of oxygen while Ambulating = 90%  Please briefly explain why patient needs home oxygen: pt oxygen level decreases with ambulation without oxygen

## 2016-03-30 NOTE — Progress Notes (Signed)
PROGRESS NOTE    Beth [name]  GGE:[id number] DOB: [birth year] DOA: 03/27/2016 PCP: Salena Saner., MD (Confirm with patient/family/NH records and if not entered, this HAS to be entered at [facility] point of entry. "No PCP" if truly none.)   Brief Narrative: (Start on day 1 of progress note - keep it brief and live)  New onset diastolic heart failure. 75 y/o female with chronic hypertension and copd. Developed renal failure and hypotension, holding diuresis.   Assessment & Plan:   Active Problems:   CHF (congestive heart failure) (HCC)   Heart failure (HCC)   Acute pulmonary edema (HCC)   Essential hypertension   Dyslipidemia   1. Cardiovascular. Holding diuresis, blood pressure systolic up to 465 from 90 and 97. Will continue to hold on furosemide, continue low dose of metoprolol with holding parameters. Out of bed as tolerated. Continue asa and atorvastatin. Physical therapy evaluation. Continue telemetry monitor.  2. Pulmonary. Will continue to monitor oxymetry, holding furosemide, will need ambulatory oxymetry before discharge, continue bronchodilator as needed.  3. Nephrology. Cr trending down, will continue to hold on furosemide. Noted K at 5.0 will give one dose of kayexilate and follow on renal panel in am, avoid hypotension and nephrotoxic meds.  4. Endocrinology. Will hold on metformin for now, will continue glucose cover with insulin sliding scale, noted serum glucose up to 400, will add low dose long acting insulin for now, 5 units.   DVT prophylaxis:  Heparin Code Status: (Full/Partial - specify details) Family Communication: Daughter at bedside. Disposition Plan: (specify when and where you expect patient to be discharged). Include barriers to DC in this tab.   Consultants:     Procedures: (Don't include imaging studies which can be auto populated. Include things that cannot be auto populated i.e. Echo, Carotid and venous dopplers, Foley, Bipap, HD, tubes/drains,  wound vac, central lines etc)    Antimicrobials: (specify start and planned stop date. Auto populated tables are space occupying and do not give end dates)     Subjective: Patient with persistent hypotension, feeling very weak and deconditioned. No nausea or vomiting, tolerating po well.   Objective: Filed Vitals:   03/29/16 2033 03/29/16 2122 03/30/16 0552 03/30/16 0837  BP: 90/56  97/67   Pulse: 67  65   Temp: 98.2 F (36.8 C)  97.9 F (36.6 C)   TempSrc: Oral  Oral   Resp: 17  18   Height:      Weight:   78.608 kg (173 lb 4.8 oz)   SpO2: 99% 98% 100% 99%    Intake/Output Summary (Last 24 hours) at 03/30/16 1247 Last data filed at 03/30/16 0905  Gross per 24 hour  Intake   1440 ml  Output   1150 ml  Net    290 ml   Filed Weights   03/28/16 0531 03/29/16 0500 03/30/16 0552  Weight: 76.613 kg (168 lb 14.4 oz) 77.61 kg (171 lb 1.6 oz) 78.608 kg (173 lb 4.8 oz)    Examination:   General exam:  Deconditioned and ill looking appearing. E ENT: Mild conjunctival pallor, oral mucosa dry.  Respiratory system: Decreased breath sounds at bases. Respiratory effort normal. No wheezing, rales or rhonchi, vesicular breath sounds bilaterally. Cardiovascular system: S1 & S2 heard, RRR. No JVD, murmurs, rubs, gallops or clicks. Positive trace edema, symmetric lower ext. Gastrointestinal system: Abdomen is nondistended, soft and nontender. No organomegaly or masses felt. Normal bowel sounds heard. Central nervous system: Alert and oriented. No focal neurological  deficits. Extremities: Symmetric 5 x 5 power. Skin: No rashes, lesions or ulcers Psychiatry: Judgement and insight appear normal. Mood & affect appropriate.    Data Reviewed: I have personally reviewed following labs and imaging studies  CBC:  Recent Labs Lab 03/27/16 1328 03/27/16 1935  WBC 9.3 7.4  HGB 13.8 13.7  HCT 42.6 42.4  MCV 89.9 89.6  PLT 258 800   Basic Metabolic Panel:  Recent Labs Lab  03/27/16 1328 03/27/16 1935 03/28/16 0554 03/28/16 0927 03/29/16 0423 03/30/16 0318  NA 135  --  139 136 139 133*  K 4.0  --  4.4 5.2* 5.2* 5.0  CL 104  --  100* 103 103 100*  CO2 20*  --  _0 GLUCOSE 353*  --  323* 195* 215* 338*  BUN 25*  --  33* 35* 56* 64*  CREATININE 0.95 0.86 1.36* 1.34* 1.70* 1.42*  CALCIUM 9.4  --  9.1 8.9 8.4* 8.2*   GFR: Estimated Creatinine Clearance: 33.2 mL/min (by C-G formula based on Cr of 1.42). Liver Function Tests:  Recent Labs Lab 03/28/16 0554  AST 43*  ALT 124*  ALKPHOS 86  BILITOT 0.7  PROT 5.0*  ALBUMIN 2.8*   No results for input(s): LIPASE, AMYLASE in the last 168 hours. No results for input(s): AMMONIA in the last 168 hours. Coagulation Profile: No results for input(s): INR, PROTIME in the last 168 hours. Cardiac Enzymes: No results for input(s): CKTOTAL, CKMB, CKMBINDEX, TROPONINI in the last 168 hours. BNP (last 3 results) No results for input(s): PROBNP in the last 8760 hours. HbA1C:  Recent Labs  03/28/16 0927  HGBA1C 11.2*   CBG:  Recent Labs Lab 03/29/16 1154 03/29/16 1600 03/29/16 2229 03/30/16 0542 03/30/16 1136  GLUCAP 326* 477* 282* 333* 335*   Lipid Profile: No results for input(s): CHOL, HDL, LDLCALC, TRIG, CHOLHDL, LDLDIRECT in the last 72 hours. Thyroid Function Tests: No results for input(s): TSH, T4TOTAL, FREET4, T3FREE, THYROIDAB in the last 72 hours. Anemia Panel: No results for input(s): VITAMINB12, FOLATE, FERRITIN, TIBC, IRON, RETICCTPCT in the last 72 hours. Sepsis Labs: No results for input(s): PROCALCITON, LATICACIDVEN in the last 168 hours.  No results found for this or any previous visit (from the past 240 hour(s)).       Radiology Studies: No results found.      Scheduled Meds: . allopurinol  300 mg Oral Daily  . aspirin EC  81 mg Oral Daily  . atorvastatin  40 mg Oral q1800  . heparin  5,000 Units Subcutaneous Q8H  . insulin aspart  0-9 Units Subcutaneous  TID WC  . loratadine  10 mg Oral Daily  . metoprolol tartrate  12.5 mg Oral Q12H  . mometasone-formoterol  2 puff Inhalation BID  . omega-3 acid ethyl esters  1 g Oral Daily  . pantoprazole  40 mg Oral Daily  . sodium chloride flush  3 mL Intravenous Q12H  . sodium chloride flush  3 mL Intravenous Q12H  . sodium polystyrene  30 g Oral Once   Continuous Infusions:    LOS: 3 days        Beth Sterba Gerome Apley, MD [facility] Pager [phone]  If 7PM-7AM, please contact night-coverage [email or web address] Password [facility] 03/30/2016, 12:47 PM

## 2016-03-30 NOTE — Consult Note (Signed)
   [facility] CM Inpatient Consult   03/30/2016  ANIZA SHOR 1941-04-17 [id number]   Patient evaluated for community based chronic disease management services with [place] [facility] as a benefit of patient's [facility]. Spoke with patient and daughter, Lorriane Shire,  at bedside to explain [place] [facility] services.  Patient and daughter indicates that her primary care provider is Dr. Willey Blade.  Will follow up to check the MD's status with [facility] as a network provider.  They both verbalized understanding.  Left contact information and [facility] literature at bedside. Made Inpatient Case Manager aware that [place] Management following. Of note, [facility] Care Management services does not replace or interfere with any services that are arranged by inpatient case management or social work.  For additional questions or referrals please contact:    Natividad Brood, RN BSN [facility] Liaison  [phone] business mobile phone Toll free office [phone]

## 2016-03-31 LAB — BASIC METABOLIC PANEL
Anion gap: 10 (ref 5–15)
BUN: 35 mg/dL — AB (ref 6–20)
CHLORIDE: 101 mmol/L (ref 101–111)
CO2: 28 mmol/L (ref 22–32)
Calcium: 8.3 mg/dL — ABNORMAL LOW (ref 8.9–10.3)
Creatinine, Ser: 0.94 mg/dL (ref 0.44–1.00)
GFR calc Af Amer: >60 mL/min (ref >60)
GFR calc non Af Amer: 58 mL/min — ABNORMAL LOW (ref >60)
GLUCOSE: 231 mg/dL — AB (ref 65–99)
POTASSIUM: 3.9 mmol/L (ref 3.5–5.1)
Sodium: 139 mmol/L (ref 135–145)

## 2016-03-31 LAB — GLUCOSE, CAPILLARY
GLUCOSE-CAPILLARY: 207 mg/dL — AB (ref 65–99)
GLUCOSE-CAPILLARY: 332 mg/dL — AB (ref 65–99)
Glucose-Capillary: 330 mg/dL — ABNORMAL HIGH (ref 65–99)
Glucose-Capillary: 222 mg/dL — ABNORMAL HIGH (ref 65–99)

## 2016-03-31 MED ORDER — TIOTROPIUM BROMIDE MONOHYDRATE 18 MCG IN CAPS
18.0000 ug | ORAL_CAPSULE | Freq: Every day | RESPIRATORY_TRACT | Status: DC
  Administered 2016-04-01 – 2016-04-06 (×5): 18 ug via RESPIRATORY_TRACT
  Filled 2016-03-31 (×2): qty 5

## 2016-03-31 MED ORDER — FUROSEMIDE 20 MG PO TABS
20.0000 mg | ORAL_TABLET | Freq: Every day | ORAL | Status: DC
  Administered 2016-03-31: 20 mg via ORAL
  Filled 2016-03-31: qty 1

## 2016-03-31 MED ORDER — INSULIN GLARGINE 100 UNIT/ML ~~LOC~~ SOLN
10.0000 [IU] | Freq: Every day | SUBCUTANEOUS | Status: DC
  Administered 2016-03-31 – 2016-04-02 (×3): 10 [IU] via SUBCUTANEOUS
  Filled 2016-03-31 (×3): qty 0.1

## 2016-03-31 NOTE — Progress Notes (Signed)
[facility] PROGRESS NOTE  Beth [name] MWN:[id number] DOB: 1941-02-02 DOA: 03/27/2016 PCP: Salena Saner., MD  Assessment/Plan: 75 y/o female with PMH of HTN, DM, dCHF, COPD presented with Progressive SOB, PND leg edema. Admitted with CHF, started on diuresis on admission then developed renal insufficiency.   Acute on chronic CHF. Diastolic HF. Echo: LVEF 55%. Wall motion was  normal; there were no regional wall motion abnormalities. RV dysfunction, Pulmonary HTN (likely due to COPD). -Pt was on IV diuresis, then developed renal insufficiency, diuretics were on hold. Today, renal function is improved, but still has crackles BL lungs. We will start low dose oral lasix. Monitor I/O, daily weight, renal labs AM. Hold ARB/ACE due to renal issues    AKI. Likely due to lasix on top of hctz+irbesartan given on admission. -renal function improved. Resume low dose lasix for CHF. Hold hctz/arb  DM. ha1c-11.2. Uncontrolled. D/c metformin due to renal issues. Start lantus+ ISS.  COPD. No wheezing on exam. Cont bronchodilators prn, dulera, add spiriva   Disposition: monitor 24-48 hrs  Code Status: full Family Communication:  D/w patient (indicate person spoken with, relationship, and if by phone, the number) Disposition Plan: home 24-48 hrs.    Consultants:  none  Procedures:  Echo   Antibiotics:  none (indicate start date, and stop date if known)  HPI/Subjective: Alert, reports mild DOE  Objective: Filed Vitals:   03/30/16 2145 03/31/16 0506  BP: 92/55 124/67  Pulse: 80 77  Temp: 97.8 F (36.6 C) 98.2 F (36.8 C)  Resp: 18 18    Intake/Output Summary (Last 24 hours) at 03/31/16 1114 Last data filed at 03/31/16 1033  Gross per 24 hour  Intake    480 ml  Output   1100 ml  Net   -620 ml   Filed Weights   03/29/16 0500 03/30/16 0552 03/31/16 0500  Weight: 77.61 kg (171 lb 1.6 oz) 78.608 kg (173 lb 4.8 oz) 78.291 kg (172 lb 9.6 oz)    Exam:   General:  No  distress   Cardiovascular: s1,s2 rrr  Respiratory: BL crackles LL  Abdomen: soft, nt,nd   Musculoskeletal: mild pedal edema    Data Reviewed: Basic Metabolic Panel:  Recent Labs Lab 03/28/16 0554 03/28/16 0927 03/29/16 0423 03/30/16 0318 03/31/16 0329  NA 139 136 139 133* 139  K 4.4 5.2* 5.2* 5.0 3.9  CL 100* 103 103 100* 101  CO2 _0 GLUCOSE 323* 195* 215* 338* 231*  BUN 33* 35* 56* 64* 35*  CREATININE 1.36* 1.34* 1.70* 1.42* 0.94  CALCIUM 9.1 8.9 8.4* 8.2* 8.3*   Liver Function Tests:  Recent Labs Lab 03/28/16 0554  AST 43*  ALT 124*  ALKPHOS 86  BILITOT 0.7  PROT 5.0*  ALBUMIN 2.8*   No results for input(s): LIPASE, AMYLASE in the last 168 hours. No results for input(s): AMMONIA in the last 168 hours. CBC:  Recent Labs Lab 03/27/16 1328 03/27/16 1935  WBC 9.3 7.4  HGB 13.8 13.7  HCT 42.6 42.4  MCV 89.9 89.6  PLT 258 235   Cardiac Enzymes: No results for input(s): CKTOTAL, CKMB, CKMBINDEX, TROPONINI in the last 168 hours. BNP (last 3 results)  Recent Labs  03/27/16 1328  BNP 249.8*    ProBNP (last 3 results) No results for input(s): PROBNP in the last 8760 hours.  CBG:  Recent Labs Lab 03/30/16 1136 03/30/16 1420 03/30/16 1706 03/30/16 2038 03/31/16 0635  GLUCAP 335* 409* 203* 146* 207*  No results found for this or any previous visit (from the past 240 hour(s)).   Studies: No results found.  Scheduled Meds: . allopurinol  300 mg Oral Daily  . aspirin EC  81 mg Oral Daily  . atorvastatin  40 mg Oral q1800  . heparin  5,000 Units Subcutaneous Q8H  . insulin aspart  0-9 Units Subcutaneous TID WC  . loratadine  10 mg Oral Daily  . metoprolol tartrate  12.5 mg Oral Q12H  . mometasone-formoterol  2 puff Inhalation BID  . omega-3 acid ethyl esters  1 g Oral Daily  . pantoprazole  40 mg Oral Daily  . sodium chloride flush  3 mL Intravenous Q12H  . sodium chloride flush  3 mL Intravenous Q12H   Continuous  Infusions:   Active Problems:   CHF (congestive heart failure) (HCC)   Heart failure ([place])   Acute pulmonary edema ([place])   Essential hypertension   Dyslipidemia    Time spent: >35 minutes     Kinnie Feil  [facility] Pager [phone]. If 7PM-7AM, please contact night-coverage at [email or web address], password [facility] 03/31/2016, 11:14 AM  LOS: 4 days

## 2016-03-31 NOTE — Progress Notes (Signed)
Physical Therapy Treatment Patient Details Name: Beth [name] MRN: [id number] DOB: 14-May-1941 Today's Date: 03/31/2016    History of Present Illness Pt is a 75 y/o F who presented to the hospital w/ dyspnea and found to have pulmonary edema.  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     PT Comments    Beth [name] demonstrates improved pursed lip breathing technique; however, continues to use accessory muscles and SpO2 dropping to 87% w/ bed mobility.  She required 2L O2 via [place] while ambulating today.  Reviewed energy conservation techniques.  Feel that pt would benefit greatly from Cardiopulmonary Rehab.   Follow Up Recommendations  Home health PT;Supervision - Intermittent     Equipment Recommendations  Rolling walker with 5" wheels (for energy conservation)    Recommendations for Other Services Other (comment) (Cardiopulmonary Rehab)     Precautions / Restrictions Precautions Precautions: Fall Precaution Comments: monitor O2 Restrictions Weight Bearing Restrictions: No    Mobility  Bed Mobility Overal bed mobility: Modified Independent             General bed mobility comments: Increased effort.  SpO2 down to 87% on RA w/ supine>sit.  1L O2 via [place] applied.  Transfers Overall transfer level: Needs assistance Equipment used: None Transfers: Sit to/from Stand Sit to Stand: Min guard         General transfer comment: Min guard assist for safety due to min instability  Ambulation/Gait Ambulation/Gait assistance: Min guard Ambulation Distance (Feet): 90 Feet Assistive device: None Gait Pattern/deviations: Step-through pattern;Decreased stride length   Gait velocity interpretation: Below normal speed for age/gender General Gait Details: Close min guard assist.  LOB x1 but pt able to steady w/o outisde assist.  Decreased gait speed and pt requires max verbal cues and demonstration for pursed lip  breathing.  2 standing rest breaks due to SOB.  SpO2 drops to 87% on 1L O2, bumped up to 2L O2 w/ SpO2 remaining at or above 88%.  RN made aware.   Stairs            Wheelchair Mobility    Modified Rankin (Stroke Patients Only)       Balance Overall balance assessment: Needs assistance Sitting-balance support: No upper extremity supported;Feet supported Sitting balance-Leahy Scale: Fair     Standing balance support: No upper extremity supported;During functional activity Standing balance-Leahy Scale: Fair                      Cognition Arousal/Alertness: Awake/alert Behavior During Therapy: WFL for tasks assessed/performed Overall Cognitive Status: Within Functional Limits for tasks assessed                      Exercises General Exercises - Lower Extremity Ankle Circles/Pumps: AROM;Both;10 reps;Seated Long Arc Quad: AROM;Both;10 reps;Seated    General Comments General comments (skin integrity, edema, etc.): Pt continues to use accessory muscles but w/ some improvement w/ practice of pursed lip breathing. Discussed energy conservation techniques including taking breaks when pt becomes SOB while ambulating.      Pertinent Vitals/Pain Pain Assessment: No/denies pain    Home Living                      Prior Function            PT Goals (current goals can now be found in the care plan section) Acute Rehab PT Goals Patient  Stated Goal: to improve her breathing PT Goal Formulation: With patient Time For Goal Achievement: 04/13/16 Potential to Achieve Goals: Good Progress towards PT goals: Progressing toward goals    Frequency  Min 3X/week    PT Plan Current plan remains appropriate    Co-evaluation             End of Session Equipment Utilized During Treatment: Gait belt;Oxygen Activity Tolerance: Treatment limited secondary to medical complications (Comment);Patient limited by fatigue (hypoxia) Patient left: in chair;with  call bell/phone within reach;with chair alarm set     Time: 1051-1109 PT Time Calculation (min) (ACUTE ONLY): 18 min  Charges:  $Gait Training: 8-22 mins                    G Codes:      Beth [name] PT, DPT  Pager: [phone] Phone: [phone] 03/31/2016, 11:21 AM

## 2016-03-31 NOTE — Research (Signed)
REDs _0  Informed Consent   Subject Name: Beth [name]  Subject met inclusion and exclusion criteria.  The informed consent form, study requirements and expectations were reviewed with the subject and questions and concerns were addressed prior to the signing of the consent form.  The subject verbalized understanding of the trail requirements.  The subject agreed to participate in the REDs_1  trial and signed the informed consent.  The informed consent was obtained prior to performance of any protocol-specific procedures for the subject.  A copy of the signed informed consent was given to the subject and a copy was placed in the subject's medical record.  [name] 03/31/2016, 10:47 AM

## 2016-04-01 ENCOUNTER — Encounter (HOSPITAL_COMMUNITY): Payer: Self-pay | Admitting: Cardiology

## 2016-04-01 DIAGNOSIS — E785 Hyperlipidemia, unspecified: Secondary | ICD-10-CM

## 2016-04-01 DIAGNOSIS — J810 Acute pulmonary edema: Secondary | ICD-10-CM

## 2016-04-01 DIAGNOSIS — K766 Portal hypertension: Secondary | ICD-10-CM

## 2016-04-01 DIAGNOSIS — I10 Essential (primary) hypertension: Secondary | ICD-10-CM

## 2016-04-01 DIAGNOSIS — I272 Other secondary pulmonary hypertension: Principal | ICD-10-CM

## 2016-04-01 DIAGNOSIS — I5033 Acute on chronic diastolic (congestive) heart failure: Secondary | ICD-10-CM

## 2016-04-01 LAB — GLUCOSE, CAPILLARY
GLUCOSE-CAPILLARY: 383 mg/dL — AB (ref 65–99)
GLUCOSE-CAPILLARY: 170 mg/dL — AB (ref 65–99)
GLUCOSE-CAPILLARY: 301 mg/dL — AB (ref 65–99)
Glucose-Capillary: 203 mg/dL — ABNORMAL HIGH (ref 65–99)

## 2016-04-01 LAB — BASIC METABOLIC PANEL
Anion gap: 10 (ref 5–15)
BUN: 23 mg/dL — AB (ref 6–20)
CHLORIDE: 102 mmol/L (ref 101–111)
CO2: 28 mmol/L (ref 22–32)
CREATININE: 0.9 mg/dL (ref 0.44–1.00)
Calcium: 8.3 mg/dL — ABNORMAL LOW (ref 8.9–10.3)
GFR calc Af Amer: >60 mL/min (ref >60)
GFR calc non Af Amer: >60 mL/min (ref >60)
GLUCOSE: 242 mg/dL — AB (ref 65–99)
Potassium: 4 mmol/L (ref 3.5–5.1)
Sodium: 140 mmol/L (ref 135–145)

## 2016-04-01 LAB — D-DIMER, QUANTITATIVE (NOT AT ARMC)

## 2016-04-01 MED ORDER — INSULIN STARTER KIT- PEN NEEDLES (ENGLISH)
1.0000 | Freq: Once | Status: DC
  Filled 2016-04-01: qty 1

## 2016-04-01 MED ORDER — FUROSEMIDE 10 MG/ML IJ SOLN
40.0000 mg | Freq: Every day | INTRAMUSCULAR | Status: DC
  Administered 2016-04-01: 40 mg via INTRAVENOUS
  Filled 2016-04-01: qty 4

## 2016-04-01 MED ORDER — LIVING WELL WITH DIABETES BOOK
Freq: Once | Status: DC
  Filled 2016-04-01: qty 1

## 2016-04-01 NOTE — Progress Notes (Signed)
PROGRESS NOTE  Beth [name]:[id number] DOB: 12/05/1940 DOA: 03/27/2016 PCP: Salena Saner., MD   LOS: 5 days   Brief Narrative: 75 y/o female with PMH of HTN, DM, dCHF, COPD presented with Progressive SOB, PND leg edema. Admitted with CHF, started on diuresis on admission then developed renal insufficiency.   Assessment & Plan: Active Problems:   CHF (congestive heart failure) (HCC)   Heart failure (HCC)   Acute pulmonary edema (HCC)   Essential hypertension   Dyslipidemia   Acute on chronic CHF with preserved EF, RV failure - new RV dilatation and depressed systolic function - difficult to diurese few days ago due to renal failure, now resolved. Reinitiate IV diuresis today. Still with crackles on exam and LE edema - consulted cardiology today, appreciate input - Hold ARB/ACE due to renal issues  - net negative 3L, weight however all over the place. Remains clinically volume overloaded  AKI - Likely due to lasix on top of hctz+irbesartan given on admission.  - renal function improved, resume IV Lasix today and closely monitor - continue to hold Diovan   DM - ha1c-11.2. Uncontrolled.  - D/c metformin due to renal issues.  - Start lantus+ ISS.   COPD  - No wheezing on exam. Cont bronchodilators prn, dulera, add spiriva    DVT prophylaxis: heparin Code Status: Full Family Communication: no family bedside Disposition Plan: home when ready  Consultants:   Cardiology   Procedures:   2D echo Impressions: - The left ventricle was compressed by the enlarged RV. LV EF 55%. Severely dilated RV with moderately decreased systolic function. Moderate pulmonary hypertension.  Antimicrobials:  None    Subjective: - denies chest pain, no dyspnea at rest  Objective: Filed Vitals:   03/31/16 2022 03/31/16 2107 04/01/16 0439 04/01/16 0910  BP: 116/66  98/61   Pulse: 85  60   Temp: 97.4 F (36.3 C)  98.2 F (36.8 C)   TempSrc: Oral  Oral   Resp: 18  18     Height:      Weight:   78.563 kg (173 lb 3.2 oz)   SpO2: 96% 95% 99% 94%    Intake/Output Summary (Last 24 hours) at 04/01/16 1049 Last data filed at 04/01/16 0400  Gross per 24 hour  Intake    240 ml  Output   1250 ml  Net  -1010 ml   Filed Weights   03/30/16 0552 03/31/16 0500 04/01/16 0439  Weight: 78.608 kg (173 lb 4.8 oz) 78.291 kg (172 lb 9.6 oz) 78.563 kg (173 lb 3.2 oz)    Examination: Constitutional: NAD Filed Vitals:   03/31/16 2022 03/31/16 2107 04/01/16 0439 04/01/16 0910  BP: 116/66  98/61   Pulse: 85  60   Temp: 97.4 F (36.3 C)  98.2 F (36.8 C)   TempSrc: Oral  Oral   Resp: 18  18   Height:      Weight:   78.563 kg (173 lb 3.2 oz)   SpO2: 96% 95% 99% 94%   Eyes: PERRL, lids and conjunctivae normal Respiratory:  no wheezing, faint bibasilar crackles. Normal respiratory effort. No accessory muscle use.  Cardiovascular: Regular rate and rhythm, no murmurs / rubs / gallops. 1+ pitting LE edema. 2+ pedal pulses.  Abdomen: no tenderness. Bowel sounds positive.  Neurologic: non focal  Psychiatric: Normal judgment and insight. Alert and oriented x 3. Normal mood.    Data Reviewed: I have personally reviewed following labs and imaging studies  CBC:  Recent Labs Lab 03/27/16 1328 03/27/16 1935  WBC 9.3 7.4  HGB 13.8 13.7  HCT 42.6 42.4  MCV 89.9 89.6  PLT 258 948   Basic Metabolic Panel:  Recent Labs Lab 03/28/16 0927 03/29/16 0423 03/30/16 0318 03/31/16 0329 04/01/16 0402  NA 136 139 133* 139 140  K 5.2* 5.2* 5.0 3.9 4.0  CL 103 103 100* 101 102  CO2 _0 GLUCOSE 195* 215* 338* 231* 242*  BUN 35* 56* 64* 35* 23*  CREATININE 1.34* 1.70* 1.42* 0.94 0.90  CALCIUM 8.9 8.4* 8.2* 8.3* 8.3*   GFR: Estimated Creatinine Clearance: 52.4 mL/min (by C-G formula based on Cr of 0.9). Liver Function Tests:  Recent Labs Lab 03/28/16 0554  AST 43*  ALT 124*  ALKPHOS 86  BILITOT 0.7  PROT 5.0*  ALBUMIN 2.8*   No results for  input(s): LIPASE, AMYLASE in the last 168 hours. No results for input(s): AMMONIA in the last 168 hours. Coagulation Profile: No results for input(s): INR, PROTIME in the last 168 hours. Cardiac Enzymes: No results for input(s): CKTOTAL, CKMB, CKMBINDEX, TROPONINI in the last 168 hours. BNP (last 3 results) No results for input(s): PROBNP in the last 8760 hours. HbA1C: No results for input(s): HGBA1C in the last 72 hours. CBG:  Recent Labs Lab 03/31/16 0635 03/31/16 1125 03/31/16 1656 03/31/16 2053 04/01/16 0604  GLUCAP 207* 332* 330* 222* 203*   Lipid Profile: No results for input(s): CHOL, HDL, LDLCALC, TRIG, CHOLHDL, LDLDIRECT in the last 72 hours. Thyroid Function Tests: No results for input(s): TSH, T4TOTAL, FREET4, T3FREE, THYROIDAB in the last 72 hours. Anemia Panel: No results for input(s): VITAMINB12, FOLATE, FERRITIN, TIBC, IRON, RETICCTPCT in the last 72 hours. Urine analysis: No results found for: COLORURINE, APPEARANCEUR, LABSPEC, PHURINE, GLUCOSEU, HGBUR, BILIRUBINUR, KETONESUR, PROTEINUR, UROBILINOGEN, NITRITE, LEUKOCYTESUR Sepsis Labs: Invalid input(s): PROCALCITONIN, LACTICIDVEN  No results found for this or any previous visit (from the past 240 hour(s)).    Radiology Studies: No results found.   Scheduled Meds: . allopurinol  300 mg Oral Daily  . aspirin EC  81 mg Oral Daily  . atorvastatin  40 mg Oral q1800  . furosemide  40 mg Intravenous Daily  . heparin  5,000 Units Subcutaneous Q8H  . insulin aspart  0-9 Units Subcutaneous TID WC  . insulin glargine  10 Units Subcutaneous Daily  . loratadine  10 mg Oral Daily  . metoprolol tartrate  12.5 mg Oral Q12H  . mometasone-formoterol  2 puff Inhalation BID  . omega-3 acid ethyl esters  1 g Oral Daily  . pantoprazole  40 mg Oral Daily  . sodium chloride flush  3 mL Intravenous Q12H  . sodium chloride flush  3 mL Intravenous Q12H  . tiotropium  18 mcg Inhalation Daily   Continuous Infusions:    Marzetta Board, MD, PhD [facility] Pager [phone]  If 7PM-7AM, please contact night-coverage [email or web address] Password [facility] 04/01/2016, 10:49 AM

## 2016-04-01 NOTE — Progress Notes (Signed)
SATURATION QUALIFICATIONS: (This note is used to comply with regulatory documentation for home oxygen)  Patient Saturations on Room Air at Rest = 92%  Patient Saturations on Room Air while Ambulating = 82%  Patient Saturations on 2 Liters of oxygen while Ambulating = 92%  Please briefly explain why patient needs home oxygen:O2 sat dropped to 82% on room air

## 2016-04-01 NOTE — Clinical Documentation Improvement (Signed)
Hospitalist  Abnormal Lab/Test Results:  Hemoglobin A1C = 11.2.  Current blood Sugars ranging from 146 to 477  Possible Clinical Conditions associated with below indicators  Diabetes with hyperglycemia  Other Condition  Cannot Clinically Determine     Please exercise your independent, professional judgment when responding. A specific answer is not anticipated or expected.   Thank You,  [place] [phone]

## 2016-04-01 NOTE — Consult Note (Signed)
CARDIOLOGY CONSULT NOTE   Patient ID: Beth [name] MRN: [id number] DOB/AGE: 1941-07-20 75 y.o.  Admit date: 03/27/2016  Primary Physician   Salena Saner., MD Primary Cardiologist: New Requesting MD: Dr. Marzetta Board Reason for Consultation: CHF  HPI: Beth [name] is a 75 year old female with a past medical history of HTN, HLD, DM, OSA, CVA and chronic diastolic CHF.    She presented to the ED on 03/27/16 with worsening dyspnea over the past 3 weeks and bilateral lower extremity edema. Cardiology was consulted to help manage her CHF.   Echo was done on 03/28/16 and showed Ef of 55%, no wall motion abnormalities, and grade 1 diastolic dysfunction. Her right ventricle was severely dilated, and her PA pressure was 61 mmHg consistent with moderate pulmonary hypertension. She was diuresed with IV Lasix per the primary team, however it was stopped 2 days ago due to acute kidney injury.  She is very active with her seniors group, she travels with them often to various places in [place]. She has never had any SOB or chest pain with activity. Denies orthopnea and PND.  She says that her progressive dyspnea came as a surprise to her because she usually feels great. She does not watch her fluid intake at home. Eats mostly salads and fresh fruit. Does eat some meats and says that she will fry pork or chicken sometimes. Denies adding extra salt to her food. Does not weigh herself daily, says she has never been given a CHF diagnosis.   She has a CPAP at home but says she has never been diagnosed with sleep apnea. Does not wear the CPAP as she is afraid it "will kill me".  She says water fills up into the mask when she tries it on.   Past Medical History  Diagnosis Date  . Diabetes mellitus ([place])     type 2  . Hyperlipidemia   . Hypertension   . COPD (chronic obstructive pulmonary disease) ([place])     dR. wERT  . Shortness of breath     WITH EXERTION   . Asthma   . Sleep apnea    USES  CPAP   . Arthritis   . Stroke [facility])      Past Surgical History  Procedure Laterality Date  . Tubal ligation    . Cataract extraction w/phaco Right 04/19/2013    Procedure: CATARACT EXTRACTION PHACO AND INTRAOCULAR LENS PLACEMENT (IOC);  Surgeon: Adonis Brook, MD;  Location: [place];  Service: Ophthalmology;  Laterality: Right;  . Eye surgery    . Cataract extraction w/phaco Left 05/03/2013    Procedure: CATARACT EXTRACTION PHACO AND INTRAOCULAR LENS PLACEMENT (IOC);  Surgeon: Adonis Brook, MD;  Location: [place];  Service: Ophthalmology;  Laterality: Left;    Allergies  Allergen Reactions  . Aldomet [Methyldopa] Hives    I have reviewed the patient's current medications . allopurinol  300 mg Oral Daily  . aspirin EC  81 mg Oral Daily  . atorvastatin  40 mg Oral q1800  . furosemide  40 mg Intravenous Daily  . heparin  5,000 Units Subcutaneous Q8H  . insulin aspart  0-9 Units Subcutaneous TID WC  . insulin glargine  10 Units Subcutaneous Daily  . loratadine  10 mg Oral Daily  . metoprolol tartrate  12.5 mg Oral Q12H  . mometasone-formoterol  2 puff Inhalation BID  . omega-3 acid ethyl esters  1 g Oral Daily  . pantoprazole  40 mg Oral  Daily  . sodium chloride flush  3 mL Intravenous Q12H  . sodium chloride flush  3 mL Intravenous Q12H  . tiotropium  18 mcg Inhalation Daily     sodium chloride, acetaminophen, ipratropium-albuterol, ondansetron (ZOFRAN) IV, sodium chloride flush  Prior to Admission medications   Medication Sig Start Date End Date Taking? Authorizing Provider  albuterol (ACCUNEB) 0.63 MG/3ML nebulizer solution Take 1 ampule by nebulization every 6 (six) hours as needed for wheezing.    Yes Historical Provider, MD  albuterol (PROAIR HFA) 108 (90 BASE) MCG/ACT inhaler 2 puffs every 4 hours as needed only  if your can't catch your breath Patient taking differently: Inhale 2 puffs into the lungs every 4 (four) hours as needed for wheezing or shortness of breath. 2  puffs every 4 hours as needed only  if your can't catch your breath 04/03/14  Yes Tanda Rockers, MD  allopurinol (ZYLOPRIM) 300 MG tablet Take 300 mg by mouth daily.   Yes Historical Provider, MD  atorvastatin (LIPITOR) 40 MG tablet Take 40 mg by mouth daily at 6 PM.   Yes Historical Provider, MD  B Complex-C (SUPER B COMPLEX/VITAMIN C) TABS Take 1 tablet by mouth daily.   Yes Historical Provider, MD  Cholecalciferol 1000 units tablet Take 1,000 Units by mouth daily.   Yes Historical Provider, MD  Coenzyme Q10 (COQ10) 200 MG CAPS Take 200 mg by mouth daily.   Yes Historical Provider, MD  Dulaglutide (TRULICITY) 4.92 DG/4.1DV SOPN Inject 0.75 mg into the skin every Monday.   Yes Historical Provider, MD  fexofenadine (ALLEGRA) 180 MG tablet Take 180 mg by mouth daily.   Yes Historical Provider, MD  Glycopyrrolate-Formoterol (BEVESPI AEROSPHERE) 9-4.8 MCG/ACT AERO Inhale 2 puffs into the lungs 2 (two) times daily.   Yes Historical Provider, MD  meloxicam (MOBIC) 15 MG tablet Take 15 mg by mouth daily as needed for pain.   Yes Historical Provider, MD  mometasone-formoterol (DULERA) 200-5 MCG/ACT AERO Inhale 2 puffs into the lungs 2 (two) times daily.   Yes Historical Provider, MD  Omega 3 1000 MG CAPS Take 1 capsule by mouth daily.   Yes Historical Provider, MD  omeprazole (PRILOSEC) 40 MG capsule Take 40 mg by mouth daily.   Yes Historical Provider, MD  valsartan-hydrochlorothiazide (DIOVAN-HCT) 160-25 MG tablet Take 1 tablet by mouth every evening.   Yes Historical Provider, MD  budesonide-formoterol (SYMBICORT) 160-4.5 MCG/ACT inhaler INHALE 2 PUFFS FIRST THING IN THE MORNING AND THEN ANOTHER 2 PUFFS ABOUT 12 HOURS LATER 04/03/14   Tanda Rockers, MD  furosemide (LASIX) 40 MG tablet Take 1 tablet (40 mg total) by mouth daily. 12/04/13   Barton Dubois, MD  loratadine (CLARITIN) 10 MG tablet Take 1 tablet (10 mg total) by mouth daily. 12/04/13   Barton Dubois, MD  SYMBICORT 160-4.5 MCG/ACT inhaler INHALE  2 PUFFS FIRST THING IN THE MORNING AND THEN ANOTHER 2 PUFFS ABOUT 12 HOURS LATER    Tanda Rockers, MD     Social History   Social History  . Marital Status: Widowed    Spouse Name: N/A  . Number of Children: 2  . Years of Education: N/A   Occupational History  . retired International aid/development worker [employer]   Social History Main Topics  . Smoking status: Former Smoker -- 1.50 packs/day for 40 years    Types: Cigarettes    Quit date: 11/16/2004  . Smokeless tobacco: Former Systems developer  . Alcohol Use: No  Comment: occassional  . Drug Use: No  . Sexual Activity: Not on file   Other Topics Concern  . Not on file   Social History Narrative    No family status information on file.   Family History  Problem Relation Age of Onset  . Heart disease Mother   . Heart disease Brother   . Heart disease Brother   . Heart disease Sister   . Heart disease Sister      ROS:  Full 14 point review of systems complete and found to be negative unless listed above.  Physical Exam: Blood pressure 138/55, pulse 66, temperature 98 F (36.7 C), temperature source Oral, resp. rate 16, height _0  (1.575 m), weight 173 lb 3.2 oz (78.563 kg), SpO2 95 %.  General: Well developed, well nourished, female in no acute distress Head: Eyes PERRLA, No xanthomas.   Normocephalic and atraumatic, oropharynx without edema or exudate. Lungs: Fine crackles in RLL, CTA in other fields.  Heart: HRRR S1 S2, no rub/gallop,No murmur. pulses are 2+ extrem.   Neck: No carotid bruits. No lymphadenopathy.  No JVD. Abdomen: Bowel sounds present, abdomen soft and non-tender without masses or hernias noted. Msk:  No spine or cva tenderness. No weakness, no joint deformities or effusions. Extremities: No clubbing or cyanosis. +1 BLE edema.  Neuro: Alert and oriented X 3. No focal deficits noted. Psych:  Good affect, responds appropriately Skin: No rashes or lesions noted.  Labs:   Lab Results  Component Value Date   WBC 7.4  03/27/2016   HGB 13.7 03/27/2016   HCT 42.4 03/27/2016   MCV 89.6 03/27/2016   PLT 235 03/27/2016   No results for input(s): INR in the last 72 hours.  B NATRIURETIC PEPTIDE  Date/Time Value Ref Range Status  03/27/2016 01:28 PM 249.8* 0.0 - 100.0 pg/mL Final    Echo: 03/28/16 - Left ventricle: The cavity size was normal. Wall thickness was  normal. The estimated ejection fraction was 55%. Wall motion was  normal; there were no regional wall motion abnormalities. Doppler  parameters are consistent with abnormal left ventricular  relaxation (grade 1 diastolic dysfunction). - Ventricular septum: D-shaped interventricular septum suggestive  of RV pressure/volume overload. - Aortic valve: Trileaflet; moderately calcified leaflets. There  was no stenosis. - Mitral valve: There was trivial regurgitation. - Right ventricle: The cavity size was severely dilated. Systolic  function was moderately reduced. - Right atrium: The atrium was moderately dilated. - Pulmonary arteries: PA peak pressure: 61 mm Hg (S). - Inferior vena cava: The vessel was normal in size. The  respirophasic diameter changes were in the normal range (>= 50%),  consistent with normal central venous pressure. - Pericardium, extracardiac: A trivial pericardial effusion was  identified.  Impressions:  - The left ventricle was compressed by the enlarged RV. LV EF 55%.  Severely dilated RV with moderately decreased systolic function.  Moderate pulmonary hypertension.  ECG:  NSR, RBBB, diffuse t wave inversion   ASSESSMENT AND PLAN:    Active Problems:   CHF (congestive heart failure) (HCC)   Heart failure (HCC)   Acute pulmonary edema (HCC)   Essential hypertension   Dyslipidemia  1. Chronic diastolic CHF: Patient presented with worsening dyspnea and lower extremity edema over the past 3 weeks. Her weight when she was admitted was 175 pounds. It got down to 168 pounds however diuresis was  discontinued due to acute kidney injury. She is still somewhat fluid overloaded on exam, but lower extremity edema  is trace. She is still requiring oxygen with ambulation and at rest.   Echo shows dilated right ventricle. Will order D-dimer as she was never SOB before 3 weeks ago.  She does have a history of obstructive sleep apnea, says that she does not wear CPAP at home. She is afraid of the machine, it sounds like her CPAP at home is malfunctioning as she reports that it fills her mouth up with water when she uses it. Would try CPAP while here in the hospital. Needs outpatient sleep study. I suspect her dyspnea is due to pulmonary hypertension. She is on beta blocker. Can switch to Coreg.   2. HTN: Well controlled on current regimen.   3. DM: A1C is 11.2. Management per primary team. Diabetes education has been initiated.   4. COPD: Sees pulmonary outpatient. Gold III by PFT's. On Spiriva and LABA/ICS.   5. Pulmonary Hypertension: Echo reviewed with MD, will need right heart cath.   Signed: Arbutus Leas, NP 04/01/2016 1:53 PM Pager [phone]  Co-Sign MD  I have seen and examined the patient along with Arbutus Leas, NP.  I have reviewed the chart, notes and new data.  I agree with NP's note.  Key new complaints: still dyspneic, but symptoms are not different lying fully supine in bed; progressive onset of dyspnea over last 2-3 months. Reports previous evaluation for COPD with Dr. Melvyn Novas (COPD Gold class III, 30 pack year smoker, quit 12-13 years ago) and abnormal sleep study, but never tolerate CPAP. Had some worse wheezing this Spring. Denies daytime hypersomnolence Key examination changes: no wheezing, no rales, no JVD, no edema, no S3. Loud S2 Key new findings / data: echo reviewed and is consistent with pulmonary HTN with primary lung cause. There are minimal signs for LV diastolic dysfunction. At the time study was done, all parameters suggest normal LV filling pressures and moderate  to severe pulmonary artery hypertension. Estimated sPAP 61 mm Hg, but very prominent ventricular septal displacement/flattening suggests that PA pressure is near systemic. The RV is severely dilated and depressed. She has been (over) diuresed, the inferior vena cava is small. 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 - This may be related to COPD and OSA, but neither disorder seems that severe at first evaluation. - Need to exclude chronic thromboembolism. A small superimposed acute PE (on top of what must be chronic PAH) might explain recent decompensation. - consider screening serologies for autoimmune disorders, but no other signs of systemic illness. - recommend right heart catheterization. Will see if Dr. Haroldine Laws can perform. - might benefit from pulmonary vasodilators such as endothelin receptor antagonists and PDE5 inhibitors (e.g. macitentan, tadalafil).  Sanda Klein, MD, [place] [phone] 04/01/2016, 3:46 PM

## 2016-04-01 NOTE — Progress Notes (Addendum)
Inpatient Diabetes Program Recommendations  AACE/ADA: New Consensus Statement on Inpatient Glycemic Control (2015)  Target Ranges:  Prepandial:   less than 140 mg/dL      Peak postprandial:   less than 180 mg/dL (1-2 hours)      Critically ill patients:  140 - 180 mg/dL  Results for GENIENE, LIST (MRN [id number]) as of 04/01/2016 12:31  Ref. Range 03/31/2016 11:25 03/31/2016 16:56 03/31/2016 20:53 04/01/2016 06:04 04/01/2016 11:10  Glucose-Capillary Latest Ref Range: 65-99 mg/dL 332 (H) 330 (H) 222 (H) 203 (H) 383 (H)  Results for JALYNE, BRODZINSKI (MRN [id number]) as of 04/01/2016 12:31  Ref. Range 03/28/2016 09:27  Hemoglobin A1C Latest Ref Range: 4.8-5.6 % 11.2 (H)   Review of Glycemic Control  Diabetes history: DM 2 Outpatient Diabetes medications: Trulicity .75 mg q d Current orders for Inpatient glycemic control: Lantus 10 units q d + Novolog correction 0-9 units tid  Inpatient Diabetes Program Recommendations: Please consider increase in Lantus insulin to 15 units and add meal coverage 3 units tid with meals (hold if eats <50%). Noted A1c 11.2. 3:00 Spoke with patient regarding currently receiving insulin. Ordered insulin starter kit in case discharged on insulin. Reviewed with patient A1c 11.2 and patient has only been checking CBGs fasting and getting lower CBGs. Nurses to start teaching on insulin administration to prepare if patient discharged on insulin. Gave handout on A1c and basic nutrition management.  Thank you, Nani Gasser. Helena Sardo, RN, MSN, CDE Inpatient Glycemic Control Team Team Pager [phone] (8am-5pm) 04/01/2016 12:34 PM

## 2016-04-01 NOTE — Care Management Note (Signed)
Case Management Note  Patient Details  Name: Beth [name] MRN: [id number] Date of Birth: 1941-05-27  Subjective/Objective:    Admitted with CHF                Action/Plan: Patient lives alone, her daughter Lorriane Shire lives close by and checks on her often. Patient could benefit from a Disease Management program for CHF, HHC choice offered, patient chose [facility]; Butch Penny with [facility] made aware. Patient does not want a walker and she is hoping not to go home on oxygen.   Expected Discharge Date:    possibly 04/03/2016              Expected Discharge Plan:  [place]  Discharge planning Services  CM Consult  HH Arranged:  RN, PT [facility] Agency:  [place]  Status of Service:  In process, will continue to follow  Sherrilyn Rist [phone] 04/01/2016, 1:42 PM

## 2016-04-02 ENCOUNTER — Inpatient Hospital Stay (HOSPITAL_COMMUNITY): Payer: Medicare HMO

## 2016-04-02 ENCOUNTER — Encounter (HOSPITAL_COMMUNITY)
Admission: EM | Disposition: A | Discharge status: Home Health | Payer: Self-pay | Source: Home / Self Care | Attending: Internal Medicine

## 2016-04-02 DIAGNOSIS — I270 Primary pulmonary hypertension: Secondary | ICD-10-CM

## 2016-04-02 DIAGNOSIS — G4733 Obstructive sleep apnea (adult) (pediatric): Secondary | ICD-10-CM

## 2016-04-02 HISTORY — PX: CARDIAC CATHETERIZATION: SHX172

## 2016-04-02 LAB — POCT I-STAT 3, VENOUS BLOOD GAS (G3P V)
ACID-BASE EXCESS: 2 mmol/L (ref 0.0–2.0)
Acid-Base Excess: 3 mmol/L — ABNORMAL HIGH (ref 0.0–2.0)
BICARBONATE: 29.9 meq/L — AB (ref 20.0–24.0)
BICARBONATE: 30.7 meq/L — AB (ref 20.0–24.0)
O2 SAT: 55 %
O2 Saturation: 54 %
PCO2 VEN: 61.5 mmHg — AB (ref 45.0–50.0)
PO2 VEN: 32 mmHg (ref 31.0–45.0)
TCO2: 32 mmol/L (ref 0–100)
TCO2: 33 mmol/L (ref 0–100)
pCO2, Ven: 60.9 mmHg — ABNORMAL HIGH (ref 45.0–50.0)
pH, Ven: 7.299 (ref 7.250–7.300)
pH, Ven: 7.305 — ABNORMAL HIGH (ref 7.250–7.300)
pO2, Ven: 32 mmHg (ref 31.0–45.0)

## 2016-04-02 LAB — CBC
HEMATOCRIT: 39.8 % (ref 36.0–46.0)
HEMOGLOBIN: 12.7 g/dL (ref 12.0–15.0)
MCH: 29.5 pg (ref 26.0–34.0)
MCHC: 31.9 g/dL (ref 30.0–36.0)
MCV: 92.6 fL (ref 78.0–100.0)
Platelets: 259 10*3/uL (ref 150–400)
RBC: 4.3 MIL/uL (ref 3.87–5.11)
RDW: 17.4 % — ABNORMAL HIGH (ref 11.5–15.5)
WBC: 4.1 10*3/uL (ref 4.0–10.5)

## 2016-04-02 LAB — BASIC METABOLIC PANEL
ANION GAP: 9 (ref 5–15)
BUN: 22 mg/dL — ABNORMAL HIGH (ref 6–20)
CHLORIDE: 101 mmol/L (ref 101–111)
CO2: 29 mmol/L (ref 22–32)
Calcium: 8.7 mg/dL — ABNORMAL LOW (ref 8.9–10.3)
Creatinine, Ser: 0.84 mg/dL (ref 0.44–1.00)
Glucose, Bld: 265 mg/dL — ABNORMAL HIGH (ref 65–99)
POTASSIUM: 3.9 mmol/L (ref 3.5–5.1)
SODIUM: 139 mmol/L (ref 135–145)

## 2016-04-02 LAB — GLUCOSE, CAPILLARY
GLUCOSE-CAPILLARY: 187 mg/dL — AB (ref 65–99)
Glucose-Capillary: 239 mg/dL — ABNORMAL HIGH (ref 65–99)
Glucose-Capillary: 171 mg/dL — ABNORMAL HIGH (ref 65–99)
Glucose-Capillary: 139 mg/dL — ABNORMAL HIGH (ref 65–99)
Glucose-Capillary: 274 mg/dL — ABNORMAL HIGH (ref 65–99)

## 2016-04-02 SURGERY — RIGHT HEART CATH

## 2016-04-02 MED ORDER — ONDANSETRON HCL 4 MG/2ML IJ SOLN: 4.0000 mg | Freq: Four times a day (QID) | INTRAMUSCULAR | Status: DC | PRN

## 2016-04-02 MED ORDER — POTASSIUM CHLORIDE CRYS ER 20 MEQ PO TBCR
40.0000 meq | EXTENDED_RELEASE_TABLET | Freq: Every day | ORAL | Status: DC
  Administered 2016-04-02 – 2016-04-06 (×5): 40 meq via ORAL
  Filled 2016-04-02 (×5): qty 2

## 2016-04-02 MED ORDER — SODIUM CHLORIDE 0.9 % IV SOLN: 250.0000 mL | INTRAVENOUS | Status: DC | PRN

## 2016-04-02 MED ORDER — LIDOCAINE HCL (PF) 1 % IJ SOLN
INTRAMUSCULAR | Status: AC
  Filled 2016-04-02: qty 30

## 2016-04-02 MED ORDER — HEPARIN (PORCINE) IN NACL 2-0.9 UNIT/ML-% IJ SOLN
INTRAMUSCULAR | Status: DC | PRN
  Administered 2016-04-02: 500 mL

## 2016-04-02 MED ORDER — MIDAZOLAM HCL 2 MG/2ML IJ SOLN
INTRAMUSCULAR | Status: AC
  Filled 2016-04-02: qty 2

## 2016-04-02 MED ORDER — ACETAMINOPHEN 325 MG PO TABS
650.0000 mg | ORAL_TABLET | ORAL | Status: DC | PRN
  Administered 2016-04-03 – 2016-04-04 (×2): 650 mg via ORAL
  Filled 2016-04-02 (×2): qty 2

## 2016-04-02 MED ORDER — TECHNETIUM TC 99M DIETHYLENETRIAME-PENTAACETIC ACID: 29.8000 | Freq: Once | INTRAVENOUS | Status: DC | PRN

## 2016-04-02 MED ORDER — SILDENAFIL CITRATE 20 MG PO TABS
20.0000 mg | ORAL_TABLET | Freq: Three times a day (TID) | ORAL | Status: DC
  Administered 2016-04-02 – 2016-04-03 (×3): 20 mg via ORAL
  Filled 2016-04-02 (×3): qty 1

## 2016-04-02 MED ORDER — SODIUM CHLORIDE 0.9% FLUSH
3.0000 mL | Freq: Two times a day (BID) | INTRAVENOUS | Status: DC
  Administered 2016-04-02 – 2016-04-04 (×2): 3 mL via INTRAVENOUS

## 2016-04-02 MED ORDER — SODIUM CHLORIDE 0.9 % IV SOLN
INTRAVENOUS | Status: DC
  Administered 2016-04-02: 1000 mL via INTRAVENOUS

## 2016-04-02 MED ORDER — SODIUM CHLORIDE 0.9% FLUSH
3.0000 mL | Freq: Two times a day (BID) | INTRAVENOUS | Status: DC
  Administered 2016-04-04 – 2016-04-05 (×2): 3 mL via INTRAVENOUS

## 2016-04-02 MED ORDER — FUROSEMIDE 10 MG/ML IJ SOLN
60.0000 mg | Freq: Two times a day (BID) | INTRAMUSCULAR | Status: DC
  Administered 2016-04-02 – 2016-04-03 (×2): 60 mg via INTRAVENOUS
  Filled 2016-04-02 (×2): qty 6

## 2016-04-02 MED ORDER — SODIUM CHLORIDE 0.9% FLUSH: 3.0000 mL | INTRAVENOUS | Status: DC | PRN

## 2016-04-02 MED ORDER — INSULIN GLARGINE 100 UNIT/ML ~~LOC~~ SOLN
12.0000 [IU] | Freq: Every day | SUBCUTANEOUS | Status: DC
  Administered 2016-04-03 – 2016-04-04 (×2): 12 [IU] via SUBCUTANEOUS
  Filled 2016-04-02 (×2): qty 0.12

## 2016-04-02 MED ORDER — FENTANYL CITRATE (PF) 100 MCG/2ML IJ SOLN
INTRAMUSCULAR | Status: AC
  Filled 2016-04-02: qty 2

## 2016-04-02 MED ORDER — LIDOCAINE HCL (PF) 1 % IJ SOLN
INTRAMUSCULAR | Status: DC | PRN
  Administered 2016-04-02: 2 mL

## 2016-04-02 MED ORDER — MIDAZOLAM HCL 2 MG/2ML IJ SOLN
INTRAMUSCULAR | Status: DC | PRN
  Administered 2016-04-02: 1 mg via INTRAVENOUS

## 2016-04-02 MED ORDER — TECHNETIUM TO 99M ALBUMIN AGGREGATED
4.0000 | Freq: Once | INTRAVENOUS | Status: AC | PRN
  Administered 2016-04-02: 4 via INTRAVENOUS

## 2016-04-02 MED ORDER — HEPARIN SODIUM (PORCINE) 5000 UNIT/ML IJ SOLN: 5000.0000 [IU] | Freq: Three times a day (TID) | INTRAMUSCULAR | Status: DC

## 2016-04-02 MED ORDER — FENTANYL CITRATE (PF) 100 MCG/2ML IJ SOLN
INTRAMUSCULAR | Status: DC | PRN
  Administered 2016-04-02: 25 ug via INTRAVENOUS

## 2016-04-02 MED ORDER — HEPARIN (PORCINE) IN NACL 2-0.9 UNIT/ML-% IJ SOLN
INTRAMUSCULAR | Status: AC
  Filled 2016-04-02: qty 500

## 2016-04-02 MED ORDER — INSULIN ASPART 100 UNIT/ML ~~LOC~~ SOLN
2.0000 [IU] | Freq: Three times a day (TID) | SUBCUTANEOUS | Status: DC
  Administered 2016-04-03 – 2016-04-04 (×5): 2 [IU] via SUBCUTANEOUS

## 2016-04-02 SURGICAL SUPPLY — 6 items
CATH BALLN WEDGE 5F 110CM (CATHETERS) ×3 IMPLANT
PACK CARDIAC CATHETERIZATION (CUSTOM PROCEDURE TRAY) ×3 IMPLANT
SHEATH FAST CATH BRACH 5F 5CM (SHEATH) ×3 IMPLANT
TRANSDUCER W/STOPCOCK (MISCELLANEOUS) ×3 IMPLANT
TUBING ART PRESS 72  MALE/FEM (TUBING) ×2
TUBING ART PRESS 72 MALE/FEM (TUBING) ×1 IMPLANT

## 2016-04-02 NOTE — Interval H&P Note (Signed)
History and Physical Interval Note:  04/02/2016 4:43 PM  Beth [name]  has presented today for surgery, with the diagnosis of hf  The various methods of treatment have been discussed with the patient and family. After consideration of risks, benefits and other options for treatment, the patient has consented to  Procedure(s): Right Heart Cath (N/A) as a surgical intervention .  The patient's history has been reviewed, patient examined, no change in status, stable for surgery.  I have reviewed the patient's chart and labs.  Questions were answered to the patient's satisfaction.     Loys Hoselton [employer]

## 2016-04-02 NOTE — Consult Note (Signed)
Patient Name: Beth [name] Date of Encounter: 04/02/2016  Patient Profile: Ms. Olheiser is a 75 year old female with a past medical history of HTN, HLD, DM, OSA, CVA and chronic diastolic CHF.Admitted on 03/27/16 for worsening dyspnea and BLE edema, Echo shows severe pulmonary HTN.   SUBJECTIVE: feels well today, still SOB with activity.    OBJECTIVE Filed Vitals:   04/01/16 1141 04/01/16 2017 04/01/16 2125 04/02/16 0444  BP: 138/55  139/68 120/63  Pulse: 66 81 76 70  Temp: 98 F (36.7 C)  98.2 F (36.8 C) 97.9 F (36.6 C)  TempSrc: Oral  Oral Oral  Resp: _0 Height:      Weight:    172 lb 6.4 oz (78.2 kg)  SpO2: 95% 96% 95% 95%    Intake/Output Summary (Last 24 hours) at 04/02/16 0647 Last data filed at 04/02/16 0448  Gross per 24 hour  Intake    780 ml  Output   1350 ml  Net   -570 ml   Filed Weights   03/31/16 0500 04/01/16 0439 04/02/16 0444  Weight: 172 lb 9.6 oz (78.291 kg) 173 lb 3.2 oz (78.563 kg) 172 lb 6.4 oz (78.2 kg)    PHYSICAL EXAM General: Well developed, well nourished, female in no acute distress. Head: Normocephalic, atraumatic.  Neck: Supple without bruits,no JVD. Lungs:  Resp regular and unlabored, CTA. Heart: RRR, S1, S2, no S3, S4, or murmur; no rub. Abdomen: Soft, non-tender, non-distended, BS + x 4.  Extremities: No clubbing, cyanosis, No edema.  Neuro: Alert and oriented X 3. Moves all extremities spontaneously. Psych: Normal affect.  LABS: CBC: Recent Labs  04/02/16 0452  WBC 4.1  HGB 12.7  HCT 39.8  MCV 92.6  PLT 725   Basic Metabolic Panel: Recent Labs  04/01/16 0402 04/02/16 0452  NA 140 139  K 4.0 3.9  CL 102 101  CO2 28 29  GLUCOSE 242* 265*  BUN 23* 22*  CREATININE 0.90 0.84  CALCIUM 8.3* 8.7*   BNP:  B NATRIURETIC PEPTIDE  Date/Time Value Ref Range Status  03/27/2016 01:28 PM 249.8* 0.0 - 100.0 pg/mL Final   D-dimer: Recent Labs  04/01/16 1528  DDIMER <0.27    Current  facility-administered medications:  .  0.9 %  sodium chloride infusion, 250 mL, Intravenous, PRN, Tawni Millers, MD .  acetaminophen (TYLENOL) tablet 650 mg, 650 mg, Oral, Q4H PRN, Tawni Millers, MD, 650 mg at 04/01/16 1956 .  allopurinol (ZYLOPRIM) tablet 300 mg, 300 mg, Oral, Daily, Mauricio Gerome Apley, MD, 300 mg at 04/01/16 1015 .  aspirin EC tablet 81 mg, 81 mg, Oral, Daily, Mauricio Gerome Apley, MD, 81 mg at 04/01/16 1015 .  atorvastatin (LIPITOR) tablet 40 mg, 40 mg, Oral, q1800, Tawni Millers, MD, 40 mg at 04/01/16 1800 .  furosemide (LASIX) injection 40 mg, 40 mg, Intravenous, Daily, Caren Griffins, MD, 40 mg at 04/01/16 1015 .  heparin injection 5,000 Units, 5,000 Units, Subcutaneous, Q8H, Mauricio Gerome Apley, MD, 5,000 Units at 04/02/16 0645 .  insulin aspart (novoLOG) injection 0-9 Units, 0-9 Units, Subcutaneous, TID WC, Mauricio Gerome Apley, MD, 2 Units at 04/01/16 1630 .  insulin glargine (LANTUS) injection 10 Units, 10 Units, Subcutaneous, Daily, Kinnie Feil, MD, 10 Units at 04/01/16 1000 .  insulin starter kit- pen needles (English) 1 kit, 1 kit, Other, Once, Costin M Gherghe, MD .  ipratropium-albuterol (DUONEB) 0.5-2.5 (3) MG/3ML nebulizer solution 3 mL,  3 mL, Nebulization, Q4H PRN, Tawni Millers, MD .  living well with diabetes book MISC, , Does not apply, Once, Costin Karlyne Greenspan, MD .  loratadine (CLARITIN) tablet 10 mg, 10 mg, Oral, Daily, Mauricio Gerome Apley, MD, 10 mg at 04/01/16 1015 .  metoprolol tartrate (LOPRESSOR) tablet 12.5 mg, 12.5 mg, Oral, Q12H, Mauricio Gerome Apley, MD, 12.5 mg at 04/01/16 2258 .  mometasone-formoterol (DULERA) 200-5 MCG/ACT inhaler 2 puff, 2 puff, Inhalation, BID, Mauricio Gerome Apley, MD, 2 puff at 04/01/16 2017 .  omega-3 acid ethyl esters (LOVAZA) capsule 1 g, 1 g, Oral, Daily, Mauricio Gerome Apley, MD, 1 g at 04/01/16 1015 .  ondansetron (ZOFRAN) injection 4 mg, 4 mg, Intravenous, Q6H  PRN, Tawni Millers, MD .  pantoprazole (PROTONIX) EC tablet 40 mg, 40 mg, Oral, Daily, Mauricio Gerome Apley, MD, 40 mg at 04/01/16 1015 .  sodium chloride flush (NS) 0.9 % injection 3 mL, 3 mL, Intravenous, Q12H, Mauricio Gerome Apley, MD, 3 mL at 04/01/16 2256 .  sodium chloride flush (NS) 0.9 % injection 3 mL, 3 mL, Intravenous, PRN, Tawni Millers, MD .  sodium chloride flush (NS) 0.9 % injection 3 mL, 3 mL, Intravenous, Q12H, Mauricio Gerome Apley, MD, 3 mL at 04/01/16 1000 .  tiotropium (SPIRIVA) inhalation capsule 18 mcg, 18 mcg, Inhalation, Daily, Kinnie Feil, MD, 18 mcg at 04/01/16 0908    TELE: NSR    ECG: NSR with RBBB, diffuse T wave inversion.   Echo 03/28/16: Study Conclusions  - Left ventricle: The cavity size was normal. Wall thickness was  normal. The estimated ejection fraction was 55%. Wall motion was  normal; there were no regional wall motion abnormalities. Doppler  parameters are consistent with abnormal left ventricular  relaxation (grade 1 diastolic dysfunction). - Ventricular septum: D-shaped interventricular septum suggestive  of RV pressure/volume overload. - Aortic valve: Trileaflet; moderately calcified leaflets. There  was no stenosis. - Mitral valve: There was trivial regurgitation. - Right ventricle: The cavity size was severely dilated. Systolic  function was moderately reduced. - Right atrium: The atrium was moderately dilated. - Pulmonary arteries: PA peak pressure: 61 mm Hg (S). - Inferior vena cava: The vessel was normal in size. The  respirophasic diameter changes were in the normal range (>= 50%),  consistent with normal central venous pressure. - Pericardium, extracardiac: A trivial pericardial effusion was  identified.  Impressions:  - The left ventricle was compressed by the enlarged RV. LV EF 55%.  Severely dilated RV with moderately decreased systolic function.  Moderate pulmonary  hypertension. Current Medications:  . allopurinol  300 mg Oral Daily  . aspirin EC  81 mg Oral Daily  . atorvastatin  40 mg Oral q1800  . furosemide  40 mg Intravenous Daily  . heparin  5,000 Units Subcutaneous Q8H  . insulin aspart  0-9 Units Subcutaneous TID WC  . insulin glargine  10 Units Subcutaneous Daily  . insulin starter kit- pen needles  1 kit Other Once  . living well with diabetes book   Does not apply Once  . loratadine  10 mg Oral Daily  . metoprolol tartrate  12.5 mg Oral Q12H  . mometasone-formoterol  2 puff Inhalation BID  . omega-3 acid ethyl esters  1 g Oral Daily  . pantoprazole  40 mg Oral Daily  . sodium chloride flush  3 mL Intravenous Q12H  . sodium chloride flush  3 mL Intravenous Q12H  . tiotropium  18 mcg Inhalation Daily  ASSESSMENT AND PLAN: Active Problems:   CHF (congestive heart failure) (HCC)   Heart failure (HCC)   Acute pulmonary edema (HCC)   Essential hypertension   Dyslipidemia   1. Pulmonary hypertension: Patient presented with progressive worsening dyspnea over the past 3 weeks. Echo show normal LV filling pressures. There is prominent ventricular septal flattening, and the RV is severely dilated and depressed. Her estimated PA pressure is 41mHg. Most likely WHO group III, as patient has COPD and OSA, however cannot exclude chronic thromboembolic etiology. D-dimer negative, MD to advise on VQ scan to assess for chronic PE. Also will need right heart cath [facility] as patient might benefit from pulmonary vasodilators.   2. Chronic diastolic CHF: Patient was initially treated for CHF with IV diuresis, no signs of fluid overload now. Echo shows normal filling pressures and EF of 55%. Will discontinue her IV Lasix.   3. COPD: Gold class III Followed by pulmonology in the past, on LABA/ICS at home. She was a 30 pack year smoker, quit smoking 12-13 years ago.   4. OSA: patient had sleep study in 2013, have not been able to find  results in Epic. She does have a CPAP at home, she does not use it. She will need education about how to use home CPAP.      Signed, EArbutus Leas, NP 6:47 AM 04/02/2016 Pager [phone] I have seen and examined the patient along with EArbutus Leas, NP.  I have reviewed the chart, notes and new data.  I agree with NP's note.  Key new complaints: breathing OK at rest, even supine. Dyspnea with exertion Key examination changes: no wheezing, loud S2   PLAN: For right heart cath today. If severe PAH is confirmed, may need VQ scan and rheumatological screening tests (RF, ANA, hep panel).  MSanda Klein MD, [phone]/18/2017, 9:11 AM

## 2016-04-02 NOTE — H&P (View-Only) (Signed)
Patient Name: Beth [name] Date of Encounter: 04/02/2016  Patient Profile: Ms. Olheiser is a 75 year old female with a past medical history of HTN, HLD, DM, OSA, CVA and chronic diastolic CHF.Admitted on 03/27/16 for worsening dyspnea and BLE edema, Echo shows severe pulmonary HTN.   SUBJECTIVE: feels well today, still SOB with activity.    OBJECTIVE Filed Vitals:   04/01/16 1141 04/01/16 2017 04/01/16 2125 04/02/16 0444  BP: 138/55  139/68 120/63  Pulse: 66 81 76 70  Temp: 98 F (36.7 C)  98.2 F (36.8 C) 97.9 F (36.6 C)  TempSrc: Oral  Oral Oral  Resp: _0 Height:      Weight:    172 lb 6.4 oz (78.2 kg)  SpO2: 95% 96% 95% 95%    Intake/Output Summary (Last 24 hours) at 04/02/16 0647 Last data filed at 04/02/16 0448  Gross per 24 hour  Intake    780 ml  Output   1350 ml  Net   -570 ml   Filed Weights   03/31/16 0500 04/01/16 0439 04/02/16 0444  Weight: 172 lb 9.6 oz (78.291 kg) 173 lb 3.2 oz (78.563 kg) 172 lb 6.4 oz (78.2 kg)    PHYSICAL EXAM General: Well developed, well nourished, female in no acute distress. Head: Normocephalic, atraumatic.  Neck: Supple without bruits,no JVD. Lungs:  Resp regular and unlabored, CTA. Heart: RRR, S1, S2, no S3, S4, or murmur; no rub. Abdomen: Soft, non-tender, non-distended, BS + x 4.  Extremities: No clubbing, cyanosis, No edema.  Neuro: Alert and oriented X 3. Moves all extremities spontaneously. Psych: Normal affect.  LABS: CBC: Recent Labs  04/02/16 0452  WBC 4.1  HGB 12.7  HCT 39.8  MCV 92.6  PLT 725   Basic Metabolic Panel: Recent Labs  04/01/16 0402 04/02/16 0452  NA 140 139  K 4.0 3.9  CL 102 101  CO2 28 29  GLUCOSE 242* 265*  BUN 23* 22*  CREATININE 0.90 0.84  CALCIUM 8.3* 8.7*   BNP:  B NATRIURETIC PEPTIDE  Date/Time Value Ref Range Status  03/27/2016 01:28 PM 249.8* 0.0 - 100.0 pg/mL Final   D-dimer: Recent Labs  04/01/16 1528  DDIMER <0.27    Current  facility-administered medications:  .  0.9 %  sodium chloride infusion, 250 mL, Intravenous, PRN, Tawni Millers, MD .  acetaminophen (TYLENOL) tablet 650 mg, 650 mg, Oral, Q4H PRN, Tawni Millers, MD, 650 mg at 04/01/16 1956 .  allopurinol (ZYLOPRIM) tablet 300 mg, 300 mg, Oral, Daily, Mauricio Gerome Apley, MD, 300 mg at 04/01/16 1015 .  aspirin EC tablet 81 mg, 81 mg, Oral, Daily, Mauricio Gerome Apley, MD, 81 mg at 04/01/16 1015 .  atorvastatin (LIPITOR) tablet 40 mg, 40 mg, Oral, q1800, Tawni Millers, MD, 40 mg at 04/01/16 1800 .  furosemide (LASIX) injection 40 mg, 40 mg, Intravenous, Daily, Caren Griffins, MD, 40 mg at 04/01/16 1015 .  heparin injection 5,000 Units, 5,000 Units, Subcutaneous, Q8H, Mauricio Gerome Apley, MD, 5,000 Units at 04/02/16 0645 .  insulin aspart (novoLOG) injection 0-9 Units, 0-9 Units, Subcutaneous, TID WC, Mauricio Gerome Apley, MD, 2 Units at 04/01/16 1630 .  insulin glargine (LANTUS) injection 10 Units, 10 Units, Subcutaneous, Daily, Kinnie Feil, MD, 10 Units at 04/01/16 1000 .  insulin starter kit- pen needles (English) 1 kit, 1 kit, Other, Once, Costin M Gherghe, MD .  ipratropium-albuterol (DUONEB) 0.5-2.5 (3) MG/3ML nebulizer solution 3 mL,  3 mL, Nebulization, Q4H PRN, Tawni Millers, MD .  living well with diabetes book MISC, , Does not apply, Once, Costin Karlyne Greenspan, MD .  loratadine (CLARITIN) tablet 10 mg, 10 mg, Oral, Daily, Mauricio Gerome Apley, MD, 10 mg at 04/01/16 1015 .  metoprolol tartrate (LOPRESSOR) tablet 12.5 mg, 12.5 mg, Oral, Q12H, Mauricio Gerome Apley, MD, 12.5 mg at 04/01/16 2258 .  mometasone-formoterol (DULERA) 200-5 MCG/ACT inhaler 2 puff, 2 puff, Inhalation, BID, Mauricio Gerome Apley, MD, 2 puff at 04/01/16 2017 .  omega-3 acid ethyl esters (LOVAZA) capsule 1 g, 1 g, Oral, Daily, Mauricio Gerome Apley, MD, 1 g at 04/01/16 1015 .  ondansetron (ZOFRAN) injection 4 mg, 4 mg, Intravenous, Q6H  PRN, Tawni Millers, MD .  pantoprazole (PROTONIX) EC tablet 40 mg, 40 mg, Oral, Daily, Mauricio Gerome Apley, MD, 40 mg at 04/01/16 1015 .  sodium chloride flush (NS) 0.9 % injection 3 mL, 3 mL, Intravenous, Q12H, Mauricio Gerome Apley, MD, 3 mL at 04/01/16 2256 .  sodium chloride flush (NS) 0.9 % injection 3 mL, 3 mL, Intravenous, PRN, Tawni Millers, MD .  sodium chloride flush (NS) 0.9 % injection 3 mL, 3 mL, Intravenous, Q12H, Mauricio Gerome Apley, MD, 3 mL at 04/01/16 1000 .  tiotropium (SPIRIVA) inhalation capsule 18 mcg, 18 mcg, Inhalation, Daily, Kinnie Feil, MD, 18 mcg at 04/01/16 0908    TELE: NSR    ECG: NSR with RBBB, diffuse T wave inversion.   Echo 03/28/16: Study Conclusions  - Left ventricle: The cavity size was normal. Wall thickness was  normal. The estimated ejection fraction was 55%. Wall motion was  normal; there were no regional wall motion abnormalities. Doppler  parameters are consistent with abnormal left ventricular  relaxation (grade 1 diastolic dysfunction). - Ventricular septum: D-shaped interventricular septum suggestive  of RV pressure/volume overload. - Aortic valve: Trileaflet; moderately calcified leaflets. There  was no stenosis. - Mitral valve: There was trivial regurgitation. - Right ventricle: The cavity size was severely dilated. Systolic  function was moderately reduced. - Right atrium: The atrium was moderately dilated. - Pulmonary arteries: PA peak pressure: 61 mm Hg (S). - Inferior vena cava: The vessel was normal in size. The  respirophasic diameter changes were in the normal range (>= 50%),  consistent with normal central venous pressure. - Pericardium, extracardiac: A trivial pericardial effusion was  identified.  Impressions:  - The left ventricle was compressed by the enlarged RV. LV EF 55%.  Severely dilated RV with moderately decreased systolic function.  Moderate pulmonary  hypertension. Current Medications:  . allopurinol  300 mg Oral Daily  . aspirin EC  81 mg Oral Daily  . atorvastatin  40 mg Oral q1800  . furosemide  40 mg Intravenous Daily  . heparin  5,000 Units Subcutaneous Q8H  . insulin aspart  0-9 Units Subcutaneous TID WC  . insulin glargine  10 Units Subcutaneous Daily  . insulin starter kit- pen needles  1 kit Other Once  . living well with diabetes book   Does not apply Once  . loratadine  10 mg Oral Daily  . metoprolol tartrate  12.5 mg Oral Q12H  . mometasone-formoterol  2 puff Inhalation BID  . omega-3 acid ethyl esters  1 g Oral Daily  . pantoprazole  40 mg Oral Daily  . sodium chloride flush  3 mL Intravenous Q12H  . sodium chloride flush  3 mL Intravenous Q12H  . tiotropium  18 mcg Inhalation Daily  ASSESSMENT AND PLAN: Active Problems:   CHF (congestive heart failure) (HCC)   Heart failure (HCC)   Acute pulmonary edema (HCC)   Essential hypertension   Dyslipidemia   1. Pulmonary hypertension: Patient presented with progressive worsening dyspnea over the past 3 weeks. Echo show normal LV filling pressures. There is prominent ventricular septal flattening, and the RV is severely dilated and depressed. Her estimated PA pressure is 41mHg. Most likely WHO group III, as patient has COPD and OSA, however cannot exclude chronic thromboembolic etiology. D-dimer negative, MD to advise on VQ scan to assess for chronic PE. Also will need right heart cath to [facility], as patient might benefit from pulmonary vasodilators.   2. Chronic diastolic CHF: Patient was initially treated for CHF with IV diuresis, no signs of fluid overload now. Echo shows normal filling pressures and EF of 55%. Will discontinue her IV Lasix.   3. COPD: Gold class III Followed by pulmonology in the past, on LABA/ICS at home. She was a 30 pack year smoker, quit smoking 12-13 years ago.   4. OSA: patient had sleep study in 2013, have not been able to find  results in Epic. She does have a CPAP at home, she does not use it. She will need education about how to use home CPAP.      Signed, EArbutus Leas, NP 6:47 AM 04/02/2016 Pager [phone] I have seen and examined the patient along with EArbutus Leas, NP.  I have reviewed the chart, notes and new data.  I agree with NP's note.  Key new complaints: breathing OK at rest, even supine. Dyspnea with exertion Key examination changes: no wheezing, loud S2   PLAN: For right heart cath today. If severe PAH is confirmed, may need VQ scan and rheumatological screening tests (RF, ANA, hep panel).  MSanda Klein MD, [phone]/18/2017, 9:11 AM

## 2016-04-02 NOTE — Progress Notes (Signed)
PROGRESS NOTE  Beth [name] QJJ:[id number] DOB: 06-24-1941 DOA: 03/27/2016 PCP: Salena Saner., MD   LOS: 6 days   Brief Narrative: 75 y/o female with PMH of HTN, DM, dCHF, COPD presented with Progressive SOB, PND leg edema. Admitted with CHF, started on diuresis on admission then developed renal insufficiency.   Assessment & Plan: Active Problems:   CHF (congestive heart failure) (HCC)   Heart failure (HCC)   Acute pulmonary edema (HCC)   Essential hypertension   Dyslipidemia   Severe pulmonary hypertension  - new RV dilatation and depressed systolic function - cardiology following, plan for RHC today  - Hold ARB/ACE due to renal issues - cards concerned about CTEPH   AKI - Likely due to lasix on top of hctz+irbesartan given on admission.  - renal function improved and stable this morning - continue to hold Diovan   DM with hyperglycemia - ha1c-11.2. Uncontrolled.  - D/c metformin due to renal issues.  - Start lantus+ ISS. Up-titrate today Lantus, add scheduled insulin  COPD  - No wheezing on exam. Continue current regimen   DVT prophylaxis: heparin Code Status: Full Family Communication: no family bedside Disposition Plan: home when ready  Consultants:   Cardiology   Procedures:   2D echo Impressions: - The left ventricle was compressed by the enlarged RV. LV EF 55%. Severely dilated RV with moderately decreased systolic function. Moderate pulmonary hypertension.  Antimicrobials:  None    Subjective: - denies chest pain, no dyspnea at rest  Objective: Filed Vitals:   04/01/16 2125 04/02/16 0444 04/02/16 0831 04/02/16 1109  BP: 139/68 120/63 100/82 122/65  Pulse: 76 70 65 62  Temp: 98.2 F (36.8 C) 97.9 F (36.6 C) 98.1 F (36.7 C) 98.7 F (37.1 C)  TempSrc: Oral Oral Oral Oral  Resp: _0 Height:      Weight:  78.2 kg (172 lb 6.4 oz)    SpO2: 95% 95% 97% 98%    Intake/Output Summary (Last 24 hours) at 04/02/16 1129 Last  data filed at 04/02/16 0448  Gross per 24 hour  Intake    780 ml  Output   1050 ml  Net   -270 ml   Filed Weights   03/31/16 0500 04/01/16 0439 04/02/16 0444  Weight: 78.291 kg (172 lb 9.6 oz) 78.563 kg (173 lb 3.2 oz) 78.2 kg (172 lb 6.4 oz)    Examination: Constitutional: NAD Filed Vitals:   04/01/16 2125 04/02/16 0444 04/02/16 0831 04/02/16 1109  BP: 139/68 120/63 100/82 122/65  Pulse: 76 70 65 62  Temp: 98.2 F (36.8 C) 97.9 F (36.6 C) 98.1 F (36.7 C) 98.7 F (37.1 C)  TempSrc: Oral Oral Oral Oral  Resp: _1 Height:      Weight:  78.2 kg (172 lb 6.4 oz)    SpO2: 95% 95% 97% 98%   Eyes: PERRL, lids and conjunctivae normal Respiratory:  no wheezing, faint bibasilar crackles. Normal respiratory effort. No accessory muscle use.  Cardiovascular: Regular rate and rhythm, no murmurs / rubs / gallops. 1+ pitting LE edema. 2+ pedal pulses.  Abdomen: no tenderness. Bowel sounds positive.  Neurologic: non focal  Psychiatric: Normal judgment and insight. Alert and oriented x 3. Normal mood.    Data Reviewed: I have personally reviewed following labs and imaging studies  CBC:  Recent Labs Lab 03/27/16 1328 03/27/16 1935 04/02/16 0452  WBC 9.3 7.4 4.1  HGB 13.8 13.7 12.7  HCT 42.6 42.4 39.8  MCV 89.9 89.6 92.6  PLT 258 235 016   Basic Metabolic Panel:  Recent Labs Lab 03/29/16 0423 03/30/16 0318 03/31/16 0329 04/01/16 0402 04/02/16 0452  NA 139 133* 139 140 139  K 5.2* 5.0 3.9 4.0 3.9  CL 103 100* 101 102 101  CO2 _0 GLUCOSE 215* 338* 231* 242* 265*  BUN 56* 64* 35* 23* 22*  CREATININE 1.70* 1.42* 0.94 0.90 0.84  CALCIUM 8.4* 8.2* 8.3* 8.3* 8.7*   GFR: Estimated Creatinine Clearance: 56 mL/min (by C-G formula based on Cr of 0.84). Liver Function Tests:  Recent Labs Lab 03/28/16 0554  AST 43*  ALT 124*  ALKPHOS 86  BILITOT 0.7  PROT 5.0*  ALBUMIN 2.8*   No results for input(s): LIPASE, AMYLASE in the last 168 hours. No  results for input(s): AMMONIA in the last 168 hours. Coagulation Profile: No results for input(s): INR, PROTIME in the last 168 hours. Cardiac Enzymes: No results for input(s): CKTOTAL, CKMB, CKMBINDEX, TROPONINI in the last 168 hours. BNP (last 3 results) No results for input(s): PROBNP in the last 8760 hours. HbA1C: No results for input(s): HGBA1C in the last 72 hours. CBG:  Recent Labs Lab 04/01/16 1630 04/01/16 2122 04/02/16 0652 04/02/16 0918 04/02/16 1114  GLUCAP 170* 301* 239* 171* 187*   Lipid Profile: No results for input(s): CHOL, HDL, LDLCALC, TRIG, CHOLHDL, LDLDIRECT in the last 72 hours. Thyroid Function Tests: No results for input(s): TSH, T4TOTAL, FREET4, T3FREE, THYROIDAB in the last 72 hours. Anemia Panel: No results for input(s): VITAMINB12, FOLATE, FERRITIN, TIBC, IRON, RETICCTPCT in the last 72 hours. Urine analysis: No results found for: COLORURINE, APPEARANCEUR, LABSPEC, PHURINE, GLUCOSEU, HGBUR, BILIRUBINUR, KETONESUR, PROTEINUR, UROBILINOGEN, NITRITE, LEUKOCYTESUR Sepsis Labs: Invalid input(s): PROCALCITONIN, LACTICIDVEN  No results found for this or any previous visit (from the past 240 hour(s)).    Radiology Studies: No results found.   Scheduled Meds: . allopurinol  300 mg Oral Daily  . aspirin EC  81 mg Oral Daily  . atorvastatin  40 mg Oral q1800  . heparin  5,000 Units Subcutaneous Q8H  . insulin aspart  0-9 Units Subcutaneous TID WC  . insulin glargine  10 Units Subcutaneous Daily  . insulin starter kit- pen needles  1 kit Other Once  . living well with diabetes book   Does not apply Once  . loratadine  10 mg Oral Daily  . metoprolol tartrate  12.5 mg Oral Q12H  . mometasone-formoterol  2 puff Inhalation BID  . omega-3 acid ethyl esters  1 g Oral Daily  . pantoprazole  40 mg Oral Daily  . sodium chloride flush  3 mL Intravenous Q12H  . sodium chloride flush  3 mL Intravenous Q12H  . tiotropium  18 mcg Inhalation Daily   Continuous  Infusions: . sodium chloride 1,000 mL (04/02/16 0854)    Marzetta Board, MD, PhD [facility] Pager [phone]  If 7PM-7AM, please contact night-coverage [email or web address] Password [facility] 04/02/2016, 11:29 AM

## 2016-04-03 ENCOUNTER — Encounter (HOSPITAL_COMMUNITY): Payer: Self-pay | Admitting: Cardiology

## 2016-04-03 DIAGNOSIS — I509 Heart failure, unspecified: Secondary | ICD-10-CM

## 2016-04-03 LAB — MPO/PR-3 (ANCA) ANTIBODIES
ANCA Proteinase 3: <3.5 U/mL (ref 0.0–3.5)
Myeloperoxidase Abs: <9 U/mL (ref 0.0–9.0)

## 2016-04-03 LAB — BASIC METABOLIC PANEL
Anion gap: 11 (ref 5–15)
BUN: 17 mg/dL (ref 6–20)
CO2: 32 mmol/L (ref 22–32)
Calcium: 9.2 mg/dL (ref 8.9–10.3)
Chloride: 100 mmol/L — ABNORMAL LOW (ref 101–111)
Creatinine, Ser: 0.86 mg/dL (ref 0.44–1.00)
GFR calc Af Amer: >60 mL/min (ref >60)
GFR calc non Af Amer: >60 mL/min (ref >60)
GLUCOSE: 237 mg/dL — AB (ref 65–99)
Potassium: 3.9 mmol/L (ref 3.5–5.1)
Sodium: 143 mmol/L (ref 135–145)

## 2016-04-03 LAB — CBC
HCT: 44.6 % (ref 36.0–46.0)
HEMATOCRIT: 40.2 % (ref 36.0–46.0)
Hemoglobin: 12.5 g/dL (ref 12.0–15.0)
Hemoglobin: 14.1 g/dL (ref 12.0–15.0)
MCH: 28.8 pg (ref 26.0–34.0)
MCH: 29.3 pg (ref 26.0–34.0)
MCHC: 31.1 g/dL (ref 30.0–36.0)
MCHC: 31.6 g/dL (ref 30.0–36.0)
MCV: 92.6 fL (ref 78.0–100.0)
MCV: 92.7 fL (ref 78.0–100.0)
PLATELETS: 278 10*3/uL (ref 150–400)
Platelets: 256 10*3/uL (ref 150–400)
RBC: 4.34 MIL/uL (ref 3.87–5.11)
RBC: 4.81 MIL/uL (ref 3.87–5.11)
RDW: 17.3 % — AB (ref 11.5–15.5)
RDW: 17.3 % — AB (ref 11.5–15.5)
WBC: 5.5 10*3/uL (ref 4.0–10.5)
WBC: 6 10*3/uL (ref 4.0–10.5)

## 2016-04-03 LAB — GLUCOSE, CAPILLARY
GLUCOSE-CAPILLARY: 284 mg/dL — AB (ref 65–99)
GLUCOSE-CAPILLARY: 231 mg/dL — AB (ref 65–99)
GLUCOSE-CAPILLARY: 250 mg/dL — AB (ref 65–99)
Glucose-Capillary: 265 mg/dL — ABNORMAL HIGH (ref 65–99)

## 2016-04-03 LAB — FANA STAINING PATTERNS

## 2016-04-03 LAB — RHEUMATOID FACTOR

## 2016-04-03 LAB — ANTINUCLEAR ANTIBODIES, IFA: ANTINUCLEAR ANTIBODIES, IFA: POSITIVE — AB

## 2016-04-03 MED ORDER — SILDENAFIL CITRATE 20 MG PO TABS
40.0000 mg | ORAL_TABLET | Freq: Three times a day (TID) | ORAL | Status: DC
  Administered 2016-04-03 – 2016-04-05 (×8): 40 mg via ORAL
  Filled 2016-04-03 (×8): qty 2

## 2016-04-03 MED ORDER — FUROSEMIDE 10 MG/ML IJ SOLN
80.0000 mg | Freq: Two times a day (BID) | INTRAMUSCULAR | Status: DC
  Administered 2016-04-03 – 2016-04-04 (×3): 80 mg via INTRAVENOUS
  Filled 2016-04-03 (×3): qty 8

## 2016-04-03 NOTE — Progress Notes (Signed)
Benefit check for Revatio with [employer] pay for Revatio is $47, generic copay is $7  prior autherization is required for both medication [phone] ; Mindi Slicker RN MHA,BSN [phone]

## 2016-04-03 NOTE — Progress Notes (Signed)
RT placed patient on CPAP auto. Patient is tolerating well. NO O2 bleed in needed. RT will continue to monitor as needed.

## 2016-04-03 NOTE — Progress Notes (Signed)
PROGRESS NOTE  ESHAL PROPPS TIW:[id number] DOB: 1941/09/14 DOA: 03/27/2016 PCP: Salena Saner., MD   LOS: 7 days   Brief Narrative: 75 y/o female with PMH of HTN, DM, dCHF, COPD presented with Progressive SOB, PND leg edema. Admitted with CHF, started on diuresis on admission then developed renal insufficiency.   Assessment & Plan: Active Problems:   CHF (congestive heart failure) (HCC)   Heart failure (HCC)   Acute pulmonary edema (HCC)   Essential hypertension   Dyslipidemia   Severe pulmonary hypertension  - new RV dilatation and depressed systolic function - cardiology following, status post right heart cath on 5/18 - Hold ARB/ACE due to renal issues - VQ scan to look for CTEPH unremarkable - She was started on Sildenafil in addition to diuresis. Monitor  AKI - Likely due to lasix on top of hctz+irbesartan given on admission.  - renal function improved and stable   DM with hyperglycemia - ha1c-11.2. Uncontrolled.  - D/c metformin due to renal issues.  - Start lantus+ ISS. Up-titrate today Lantus, add scheduled insulin  COPD  - No wheezing on exam. Continue current regimen   DVT prophylaxis: heparin Code Status: Full Family Communication: no family bedside Disposition Plan: home when ready  Consultants:   Cardiology   Procedures:   2D echo Impressions: - The left ventricle was compressed by the enlarged RV. LV EF 55%. Severely dilated RV with moderately decreased systolic function. Moderate pulmonary hypertension.  Antimicrobials:  None    Subjective: - denies chest pain, no dyspnea at rest. Overall feels better when she moves around  Objective: Filed Vitals:   04/02/16 2340 04/03/16 0638 04/03/16 1029 04/03/16 1204  BP:  155/74  125/59  Pulse: 80 79  88  Temp:  98.7 F (37.1 C)  98.6 F (37 C)  TempSrc:  Oral  Oral  Resp: _0 Height:      Weight:  76.567 kg (168 lb 12.8 oz)    SpO2: 96% 96% 86% 100%    Intake/Output Summary  (Last 24 hours) at 04/03/16 1354 Last data filed at 04/03/16 0820  Gross per 24 hour  Intake    680 ml  Output   1050 ml  Net   -370 ml   Filed Weights   04/01/16 0439 04/02/16 0444 04/03/16 0638  Weight: 78.563 kg (173 lb 3.2 oz) 78.2 kg (172 lb 6.4 oz) 76.567 kg (168 lb 12.8 oz)    Examination: Constitutional: NAD Filed Vitals:   04/02/16 2340 04/03/16 0638 04/03/16 1029 04/03/16 1204  BP:  155/74  125/59  Pulse: 80 79  88  Temp:  98.7 F (37.1 C)  98.6 F (37 C)  TempSrc:  Oral  Oral  Resp: _1 Height:      Weight:  76.567 kg (168 lb 12.8 oz)    SpO2: 96% 96% 86% 100%   Eyes: PERRL, lids and conjunctivae normal Respiratory:  no wheezing, faint bibasilar crackles. Normal respiratory effort. No accessory muscle use.  Cardiovascular: Regular rate and rhythm, no murmurs / rubs / gallops. 1+ pitting LE edema. 2+ pedal pulses.     Data Reviewed: I have personally reviewed following labs and imaging studies  CBC:  Recent Labs Lab 03/27/16 1935 04/02/16 0452 04/03/16 0339 04/03/16 1052  WBC 7.4 4.1 5.5 6.0  HGB 13.7 12.7 12.5 14.1  HCT 42.4 39.8 40.2 44.6  MCV 89.6 92.6 92.6 92.7  PLT 235 259 256 250   Basic Metabolic  Panel:  Recent Labs Lab 03/30/16 0318 03/31/16 0329 04/01/16 0402 04/02/16 0452 04/03/16 0339  NA 133* 139 140 139 143  K 5.0 3.9 4.0 3.9 3.9  CL 100* 101 102 101 100*  CO2 _0 32  GLUCOSE 338* 231* 242* 265* 237*  BUN 64* 35* 23* 22* 17  CREATININE 1.42* 0.94 0.90 0.84 0.86  CALCIUM 8.2* 8.3* 8.3* 8.7* 9.2   GFR: Estimated Creatinine Clearance: 54.2 mL/min (by C-G formula based on Cr of 0.86). Liver Function Tests:  Recent Labs Lab 03/28/16 0554  AST 43*  ALT 124*  ALKPHOS 86  BILITOT 0.7  PROT 5.0*  ALBUMIN 2.8*   No results for input(s): LIPASE, AMYLASE in the last 168 hours. No results for input(s): AMMONIA in the last 168 hours. Coagulation Profile: No results for input(s): INR, PROTIME in the last 168  hours. Cardiac Enzymes: No results for input(s): CKTOTAL, CKMB, CKMBINDEX, TROPONINI in the last 168 hours. BNP (last 3 results) No results for input(s): PROBNP in the last 8760 hours. HbA1C: No results for input(s): HGBA1C in the last 72 hours. CBG:  Recent Labs Lab 04/02/16 1114 04/02/16 1604 04/02/16 2059 04/03/16 0638 04/03/16 1200  GLUCAP 187* 139* 274* 284* 231*   Lipid Profile: No results for input(s): CHOL, HDL, LDLCALC, TRIG, CHOLHDL, LDLDIRECT in the last 72 hours. Thyroid Function Tests: No results for input(s): TSH, T4TOTAL, FREET4, T3FREE, THYROIDAB in the last 72 hours. Anemia Panel: No results for input(s): VITAMINB12, FOLATE, FERRITIN, TIBC, IRON, RETICCTPCT in the last 72 hours. Urine analysis: No results found for: COLORURINE, APPEARANCEUR, LABSPEC, PHURINE, GLUCOSEU, HGBUR, BILIRUBINUR, KETONESUR, PROTEINUR, UROBILINOGEN, NITRITE, LEUKOCYTESUR Sepsis Labs: Invalid input(s): PROCALCITONIN, LACTICIDVEN  No results found for this or any previous visit (from the past 240 hour(s)).    Radiology Studies: Nm Pulmonary Perf And Vent  04/02/2016  CLINICAL DATA:  Shortness of breath and chest pain for 2 day EXAM: NUCLEAR MEDICINE VENTILATION - PERFUSION LUNG SCAN Views: Anterior, posterior, left lateral, right lateral, RPO, LPO, RAO, LAO -ventilation and perfusion RADIOPHARMACEUTICALS:  29.8 mCi Technetium-15mDTPA aerosol inhalation and 4.0 mCi Technetium-979mAA IV COMPARISON:  Chest radiograph Mar 27, 2016 FINDINGS: Ventilation: There is photopenia in both mid lung regions in a nonsegmental distribution. Ventilation otherwise appears normal. Perfusion: There is mild photopenia in the mid lung regions bilaterally, matching the ventilation defects. There is no appreciable ventilation/perfusion mismatch on this study. No well-defined segmental perfusion defect identified. IMPRESSION: Matching ventilation perfusion midlung defects. No appreciable ventilation/perfusion  mismatch. This study constitutes an overall low probability of pulmonary embolus. Electronically Signed   By: WiLowella GripII M.D.   On: 04/02/2016 14:30     Scheduled Meds: . allopurinol  300 mg Oral Daily  . aspirin EC  81 mg Oral Daily  . atorvastatin  40 mg Oral q1800  . furosemide  80 mg Intravenous BID  . heparin  5,000 Units Subcutaneous Q8H  . insulin aspart  0-9 Units Subcutaneous TID WC  . insulin aspart  2 Units Subcutaneous TID WC  . insulin glargine  12 Units Subcutaneous Daily  . insulin starter kit- pen needles  1 kit Other Once  . living well with diabetes book   Does not apply Once  . loratadine  10 mg Oral Daily  . mometasone-formoterol  2 puff Inhalation BID  . omega-3 acid ethyl esters  1 g Oral Daily  . pantoprazole  40 mg Oral Daily  . potassium chloride  40 mEq Oral  Daily  . sildenafil  40 mg Oral TID  . sodium chloride flush  3 mL Intravenous Q12H  . sodium chloride flush  3 mL Intravenous Q12H  . sodium chloride flush  3 mL Intravenous Q12H  . sodium chloride flush  3 mL Intravenous Q12H  . tiotropium  18 mcg Inhalation Daily   Continuous Infusions:    Marzetta Board, MD, PhD [facility] Pager [phone]  If 7PM-7AM, please contact night-coverage [email or web address] Password [email or web address] 04/03/2016, 1:54 PM

## 2016-04-03 NOTE — Progress Notes (Signed)
Benefit check for the co pay cost in progress for sildenafil (REVATIO) tablet 20 mg Dose: 20 mg Freq: 3 times daily; B Pennie Rushing [phone]

## 2016-04-03 NOTE — Progress Notes (Signed)
Patient Name: Beth [name] Date of Encounter: 04/03/2016   Patient Profile: Ms. Schupp is a 75 year old female with a past medical history of HTN, HLD, DM, OSA, CVA and chronic diastolic CHF.Admitted on 03/27/16 for worsening dyspnea and BLE edema, Echo shows severe pulmonary HTN.   SUBJECTIVE:Says she feels like her breathing is better, SOB with exertion is improving.    OBJECTIVE Filed Vitals:   04/02/16 1953 04/02/16 2001 04/02/16 2340 04/03/16 0638  BP: 145/70   155/74  Pulse: 74 85 80 79  Temp: 98.7 F (37.1 C)   98.7 F (37.1 C)  TempSrc: Oral   Oral  Resp: _0 Height:      Weight:    168 lb 12.8 oz (76.567 kg)  SpO2: 96% 96% 96% 96%    Intake/Output Summary (Last 24 hours) at 04/03/16 0843 Last data filed at 04/03/16 0820  Gross per 24 hour  Intake    680 ml  Output   1250 ml  Net   -570 ml   Filed Weights   04/01/16 0439 04/02/16 0444 04/03/16 1655  Weight: 173 lb 3.2 oz (78.563 kg) 172 lb 6.4 oz (78.2 kg) 168 lb 12.8 oz (76.567 kg)    PHYSICAL EXAM General: Well developed, well nourished, female in no acute distress. Head: Normocephalic, atraumatic.  Neck: Supple without bruits, JVP 10 cm Lungs:  Resp regular and unlabored, CTA. Heart: RRR, S1, S2, no S3, S4, or murmur; no rub. Abdomen: Soft, non-tender, non-distended, BS + x 4.  Extremities: No clubbing, cyanosis, No edema.  Neuro: Alert and oriented X 3. Moves all extremities spontaneously. Psych: Normal affect.  LABS: CBC: Recent Labs  04/02/16 0452 04/03/16 0339  WBC 4.1 5.5  HGB 12.7 12.5  HCT 39.8 40.2  MCV 92.6 92.6  PLT 259 374   Basic Metabolic Panel: Recent Labs  04/02/16 0452 04/03/16 0339  NA 139 143  K 3.9 3.9  CL 101 100*  CO2 29 32  GLUCOSE 265* 237*  BUN 22* 17  CREATININE 0.84 0.86  CALCIUM 8.7* 9.2   BNP:  B NATRIURETIC PEPTIDE  Date/Time Value Ref Range Status  03/27/2016 01:28 PM 249.8* 0.0 - 100.0 pg/mL Final   D-dimer: Recent Labs  04/01/16 1528  DDIMER <0.27     Current facility-administered medications:  .  0.9 %  sodium chloride infusion, 250 mL, Intravenous, PRN, Tawni Millers, MD .  0.9 %  sodium chloride infusion, 250 mL, Intravenous, PRN, Larey Dresser, MD .  0.9 %  sodium chloride infusion, 250 mL, Intravenous, PRN, Larey Dresser, MD .  acetaminophen (TYLENOL) tablet 650 mg, 650 mg, Oral, Q4H PRN, Larey Dresser, MD .  allopurinol (ZYLOPRIM) tablet 300 mg, 300 mg, Oral, Daily, Mauricio Gerome Apley, MD, 300 mg at 04/02/16 1111 .  aspirin EC tablet 81 mg, 81 mg, Oral, Daily, Tawni Millers, MD, 81 mg at 04/02/16 1112 .  atorvastatin (LIPITOR) tablet 40 mg, 40 mg, Oral, q1800, Tawni Millers, MD, 40 mg at 04/02/16 1845 .  furosemide (LASIX) injection 60 mg, 60 mg, Intravenous, BID, Larey Dresser, MD, 60 mg at 04/02/16 1840 .  heparin injection 5,000 Units, 5,000 Units, Subcutaneous, Q8H, Mauricio Gerome Apley, MD, 5,000 Units at 04/03/16 [phone] .  insulin aspart (novoLOG) injection 0-9 Units, 0-9 Units, Subcutaneous, TID WC, Mauricio Gerome Apley, MD, 5 Units at 04/03/16 [phone] .  insulin aspart (novoLOG) injection 2 Units, 2 Units, Subcutaneous,  TID WC, Caren Griffins, MD, 2 Units at 04/03/16 [phone] .  insulin glargine (LANTUS) injection 12 Units, 12 Units, Subcutaneous, Daily, Costin M Gherghe, MD .  insulin starter kit- pen needles (English) 1 kit, 1 kit, Other, Once, Costin M Gherghe, MD .  ipratropium-albuterol (DUONEB) 0.5-2.5 (3) MG/3ML nebulizer solution 3 mL, 3 mL, Nebulization, Q4H PRN, Tawni Millers, MD .  living well with diabetes book MISC, , Does not apply, Once, Costin Karlyne Greenspan, MD .  loratadine (CLARITIN) tablet 10 mg, 10 mg, Oral, Daily, Mauricio Gerome Apley, MD, 10 mg at 04/02/16 1110 .  mometasone-formoterol (DULERA) 200-5 MCG/ACT inhaler 2 puff, 2 puff, Inhalation, BID, Mauricio Gerome Apley, MD, 2 puff at 04/02/16 2001 .  omega-3 acid ethyl esters  (LOVAZA) capsule 1 g, 1 g, Oral, Daily, Mauricio Gerome Apley, MD, 1 g at 04/02/16 1111 .  ondansetron (ZOFRAN) injection 4 mg, 4 mg, Intravenous, Q6H PRN, Larey Dresser, MD .  pantoprazole (PROTONIX) EC tablet 40 mg, 40 mg, Oral, Daily, Mauricio Gerome Apley, MD, 40 mg at 04/02/16 1110 .  potassium chloride SA (K-DUR,KLOR-CON) CR tablet 40 mEq, 40 mEq, Oral, Daily, Larey Dresser, MD, 40 mEq at 04/02/16 1851 .  sildenafil (REVATIO) tablet 20 mg, 20 mg, Oral, TID, Larey Dresser, MD, 20 mg at 04/02/16 2204 .  sodium chloride flush (NS) 0.9 % injection 3 mL, 3 mL, Intravenous, Q12H, Mauricio Gerome Apley, MD, 3 mL at 04/02/16 1000 .  sodium chloride flush (NS) 0.9 % injection 3 mL, 3 mL, Intravenous, PRN, Tawni Millers, MD .  sodium chloride flush (NS) 0.9 % injection 3 mL, 3 mL, Intravenous, Q12H, Mauricio Gerome Apley, MD, 3 mL at 04/02/16 1000 .  sodium chloride flush (NS) 0.9 % injection 3 mL, 3 mL, Intravenous, Q12H, Larey Dresser, MD, 3 mL at 04/02/16 2205 .  sodium chloride flush (NS) 0.9 % injection 3 mL, 3 mL, Intravenous, PRN, Larey Dresser, MD .  sodium chloride flush (NS) 0.9 % injection 3 mL, 3 mL, Intravenous, Q12H, Larey Dresser, MD, 0 mL at 04/02/16 2206 .  sodium chloride flush (NS) 0.9 % injection 3 mL, 3 mL, Intravenous, PRN, Larey Dresser, MD .  technetium TC 39M diethylenetriame-pentaacetic acid (DTPA) injection 29.8 milli Curie, 29.8 milli Curie, Intravenous, Once PRN, Abigail Miyamoto, MD .  tiotropium [facility]) inhalation capsule 18 mcg, 18 mcg, Inhalation, Daily, Kinnie Feil, MD, 18 mcg at 04/01/16 0908    TELE: NSR  ECG: NSR   Radiology/Studies: Nm Pulmonary Perf And Vent  04/02/2016  CLINICAL DATA:  Shortness of breath and chest pain for 2 day EXAM: NUCLEAR MEDICINE VENTILATION - PERFUSION LUNG SCAN Views: Anterior, posterior, left lateral, right lateral, RPO, LPO, RAO, LAO -ventilation and perfusion RADIOPHARMACEUTICALS:  29.8 mCi  Technetium-12mDTPA aerosol inhalation and 4.0 mCi Technetium-978mAA IV COMPARISON:  Chest radiograph Mar 27, 2016 FINDINGS: Ventilation: There is photopenia in both mid lung regions in a nonsegmental distribution. Ventilation otherwise appears normal. Perfusion: There is mild photopenia in the mid lung regions bilaterally, matching the ventilation defects. There is no appreciable ventilation/perfusion mismatch on this study. No well-defined segmental perfusion defect identified. IMPRESSION: Matching ventilation perfusion midlung defects. No appreciable ventilation/perfusion mismatch. This study constitutes an overall low probability of pulmonary embolus. Electronically Signed   By: WiLowella GripII M.D.   On: 04/02/2016 14:30     Current Medications:  . allopurinol  300 mg Oral Daily  . aspirin EC  81 mg Oral Daily  . atorvastatin  40 mg Oral q1800  . furosemide  60 mg Intravenous BID  . heparin  5,000 Units Subcutaneous Q8H  . insulin aspart  0-9 Units Subcutaneous TID WC  . insulin aspart  2 Units Subcutaneous TID WC  . insulin glargine  12 Units Subcutaneous Daily  . insulin starter kit- pen needles  1 kit Other Once  . living well with diabetes book   Does not apply Once  . loratadine  10 mg Oral Daily  . mometasone-formoterol  2 puff Inhalation BID  . omega-3 acid ethyl esters  1 g Oral Daily  . pantoprazole  40 mg Oral Daily  . potassium chloride  40 mEq Oral Daily  . sildenafil  20 mg Oral TID  . sodium chloride flush  3 mL Intravenous Q12H  . sodium chloride flush  3 mL Intravenous Q12H  . sodium chloride flush  3 mL Intravenous Q12H  . sodium chloride flush  3 mL Intravenous Q12H  . tiotropium  18 mcg Inhalation Daily      ASSESSMENT AND PLAN: Active Problems:   CHF (congestive heart failure) (HCC)   Heart failure (HCC)   Acute pulmonary edema (HCC)   Essential hypertension   Dyslipidemia  1. 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 She had a right heart cath yesterday, PCWP is not markedly elevated, output is low and LV is still very compressed by large RV. Started on IV Lasix yesterday, can start on short term Milrinone if she does not respond to IV diuresis and the addition of Revatio 46m TID.  Her breathing is better today, will watch today to see how she responds to Revatio and diuresis.  2. Chronic diastolic CHF: Patient's SOB is most likely due to her PAH, PCWP is 20, so not markedly elevated. She still needs IV diuresis as the LV is compressed, continue 634mIV Lasix today. Creatinine is 0.86 today. She has a 85093mut so far. Weight is down 4 pounds.   3. COPD: Gold class III Followed by pulmonology in the past, on LABA/ICS at home. She was a 30 pack year smoker, quit smoking 12-13 years ago. Contributory to her PAH.   4. OSA: Patient had sleep study in 2013, records pending. Does not use CPAP at home. She has been on CPAP this admission, but only wears it for a few hours at night.   Signed, EriArbutus LeasNP 8:43 AM 04/03/2016 Pager [phone] seen with NP, agree with the above note.  See [facility] findings below.    [facility] Procedural Findings: Hemodynamics (mmHg) RA mean 20 RV 100/22 PA 110/45, mean 66 PCWP mean 20 Oxygen saturations: PA 54% AO 99% Cardiac Output (Fick) 3.06  Cardiac Index (Fick) 1.71 PVR 15 WU  Severe pulmonary hypertension, suspect mixed group 3 (COPD, untreated OSA) and group 1 PH (out of proportion to COPD). She is very short of breath with exertion and had very elevated right-sided filling pressures on cath. I think that at this point, we need to diurese her to try to take some of the pressure off the LV (compressed by enlarged RV). Will also titrate up Revatio as her PCWP is not markedly elevated. Cardiac output is low, short-term milrinone for RV support and PA pressure lowering would be an option if she does not respond to initial treatment. Though cardiac  output low, do not think that I would jump towards IV  Flolan given the mixed etiology of her PH. V/Q scan not suggestive of chronic PEs and autoimmune serologies sent (RF negative).  She was able to walk to the nurses station today.   - Lasix 80 mg IV bid today, reassess tomorrow.  - Increase Revatio to 40 mg tid. Will involve care management for help getting meds.  - I will have her wear CPAP at night here.  She has CPAP at home.  - Continue oxygen for COPD.   Loralie Champagne 04/03/2016 10:30 AM

## 2016-04-03 NOTE — Progress Notes (Addendum)
Physical Therapy Treatment Patient Details Name: SHERIA ROSELLO MRN: [id number] DOB: 09-25-1941 Today's Date: 04/03/2016    History of Present Illness Pt is a 75 y/o F who presented to the hospital w/ dyspnea and found to have pulmonary edema.  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     PT Comments    Pt ambulated 130' with RW, SaO2 86% on RA walking, 96% on 2L O2 walking, HR 106. Pt progressing well with mobility. Outpatient cardiopulmonary rehab recommended.   Follow Up Recommendations  Home health PT;Supervision - Intermittent     Equipment Recommendations  Rolling walker with 5" wheels (for energy conservation)    Recommendations for Other Services Other (comment) (Cardiopulmonary Rehab)     Precautions / Restrictions Precautions Precautions: Fall Precaution Comments: monitor O2 Restrictions Weight Bearing Restrictions: No    Mobility  Bed Mobility               General bed mobility comments: NT -up on BSC  Transfers Overall transfer level: Needs assistance Equipment used: Rolling walker (2 wheeled)   Sit to Stand: Supervision         General transfer comment: Verbal cues for hand placement  Ambulation/Gait Ambulation/Gait assistance: Supervision Ambulation Distance (Feet): 130 Feet Assistive device: Rolling walker (2 wheeled) Gait Pattern/deviations: WFL(Within Functional Limits)   Gait velocity interpretation: Below normal speed for age/gender General Gait Details: steady, no LOB, SaO2 86% on RA with activity, 96% on 2L O2 [place] with walking, HR 106 walking, distance limited by fatigue. Verbal cues for pursed lip breathing.    Stairs            Wheelchair Mobility    Modified Rankin (Stroke Patients Only)       Balance     Sitting balance-Leahy Scale: Good       Standing balance-Leahy Scale: Fair                      Cognition Arousal/Alertness:  Awake/alert Behavior During Therapy: WFL for tasks assessed/performed Overall Cognitive Status: Within Functional Limits for tasks assessed                      Exercises      General Comments        Pertinent Vitals/Pain Pain Assessment: No/denies pain    Home Living                      Prior Function            PT Goals (current goals can now be found in the care plan section) Acute Rehab PT Goals Patient Stated Goal: to improve her breathing, go out to eat with her [employer] PT Goal Formulation: With patient Time For Goal Achievement: 04/13/16 Potential to Achieve Goals: Good Progress towards PT goals: Progressing toward goals    Frequency  Min 3X/week    PT Plan Current plan remains appropriate    Co-evaluation             End of Session Equipment Utilized During Treatment: Gait belt;Oxygen Activity Tolerance: Patient limited by fatigue;Treatment limited secondary to medical complications (Comment) (hypoxia) Patient left: in chair;with call bell/phone within reach     Time: 1006-1025 PT Time Calculation (min) (ACUTE ONLY): 19 min  Charges:  $Gait Training: 8-22 mins  G Codes:      Philomena Doheny 04/03/2016, 10:32 AM [phone]

## 2016-04-04 LAB — BASIC METABOLIC PANEL
Anion gap: 11 (ref 5–15)
BUN: 26 mg/dL — AB (ref 6–20)
CALCIUM: 9.3 mg/dL (ref 8.9–10.3)
CO2: 34 mmol/L — ABNORMAL HIGH (ref 22–32)
Chloride: 95 mmol/L — ABNORMAL LOW (ref 101–111)
Creatinine, Ser: 0.83 mg/dL (ref 0.44–1.00)
GFR calc Af Amer: >60 mL/min (ref >60)
GLUCOSE: 309 mg/dL — AB (ref 65–99)
Potassium: 4.3 mmol/L (ref 3.5–5.1)
Sodium: 140 mmol/L (ref 135–145)

## 2016-04-04 LAB — GLUCOSE, CAPILLARY
GLUCOSE-CAPILLARY: 133 mg/dL — AB (ref 65–99)
Glucose-Capillary: 254 mg/dL — ABNORMAL HIGH (ref 65–99)
Glucose-Capillary: 320 mg/dL — ABNORMAL HIGH (ref 65–99)
Glucose-Capillary: 360 mg/dL — ABNORMAL HIGH (ref 65–99)

## 2016-04-04 MED ORDER — INSULIN ASPART 100 UNIT/ML ~~LOC~~ SOLN
4.0000 [IU] | Freq: Three times a day (TID) | SUBCUTANEOUS | Status: DC
  Administered 2016-04-04 – 2016-04-05 (×3): 4 [IU] via SUBCUTANEOUS

## 2016-04-04 MED ORDER — SIMETHICONE 80 MG PO CHEW
80.0000 mg | CHEWABLE_TABLET | Freq: Once | ORAL | Status: AC
  Administered 2016-04-04: 80 mg via ORAL
  Filled 2016-04-04: qty 1

## 2016-04-04 MED ORDER — DILTIAZEM HCL 30 MG PO TABS
30.0000 mg | ORAL_TABLET | Freq: Two times a day (BID) | ORAL | Status: DC
  Administered 2016-04-04 – 2016-04-05 (×4): 30 mg via ORAL
  Filled 2016-04-04 (×4): qty 1

## 2016-04-04 MED ORDER — INSULIN GLARGINE 100 UNIT/ML ~~LOC~~ SOLN
16.0000 [IU] | Freq: Every day | SUBCUTANEOUS | Status: DC
Start: 2016-04-05 — End: 2016-04-06
  Administered 2016-04-05 – 2016-04-06 (×2): 16 [IU] via SUBCUTANEOUS
  Filled 2016-04-04 (×2): qty 0.16

## 2016-04-04 NOTE — Progress Notes (Signed)
Patient refused CPAP due to Claustrophobiic issues. Attempted to try may wear for short time, but she doesn't feel like it will work

## 2016-04-04 NOTE — Progress Notes (Signed)
PROGRESS NOTE  Beth [name] YBR:[id number] DOB: 1941/07/18 DOA: 03/27/2016 PCP: Salena Saner., MD   LOS: 8 days   Brief Narrative: 75 y/o female with PMH of HTN, DM, dCHF, COPD presented with Progressive SOB, PND leg edema. Admitted with CHF, started on diuresis on admission then developed renal insufficiency.   Assessment & Plan: Active Problems:   CHF (congestive heart failure) (HCC)   Heart failure (HCC)   Acute pulmonary edema (HCC)   Essential hypertension   Dyslipidemia   Severe pulmonary hypertension  - new RV dilatation and depressed systolic function - cardiology following, status post right heart cath on 5/18 - Hold ARB/ACE due to renal issues - VQ scan to look for CTEPH unremarkable - She was started on Sildenafil in addition to diuresis. Monitor - net negative 4.9 L - breathing better but still with significant dyspnea with ambulation  - continue diuresis, developing mild contraction alkalosis with mild bicarb increase  AKI - Likely due to lasix on top of hctz+irbesartan given on admission.  - renal function improved and stable, Cr 0.8, BUN increased more today   DM with hyperglycemia - ha1c-11.2. Uncontrolled.  - D/c metformin due to renal issues.  - continue to uptitrate insulin regimen Lantus 16 and scheduled 4 + SSI  COPD  - No wheezing on exam. Continue current regimen   DVT prophylaxis: heparin Code Status: Full Family Communication: no family bedside Disposition Plan: home when ready  Consultants:   Cardiology   Procedures:   2D echo Impressions: - The left ventricle was compressed by the enlarged RV. LV EF 55%. Severely dilated RV with moderately decreased systolic function. Moderate pulmonary hypertension.  Antimicrobials:  None    Subjective: - no dyspnea at rest, but gets short winded when she moves around   Objective: Filed Vitals:   04/03/16 2148 04/04/16 0514 04/04/16 1137 04/04/16 1156  BP: 118/66 133/69 127/58     Pulse: 91 76  85  Temp: 98.8 F (37.1 C) 97.8 F (36.6 C)  97.5 F (36.4 C)  TempSrc: Oral Oral  Oral  Resp: _0 Height:      Weight:  75.297 kg (166 lb)    SpO2: 97% 97%  100%    Intake/Output Summary (Last 24 hours) at 04/04/16 1453 Last data filed at 04/04/16 1300  Gross per 24 hour  Intake   1040 ml  Output   2350 ml  Net  -1310 ml   Filed Weights   04/02/16 0444 04/03/16 0638 04/04/16 0514  Weight: 78.2 kg (172 lb 6.4 oz) 76.567 kg (168 lb 12.8 oz) 75.297 kg (166 lb)    Examination: Constitutional: NAD Filed Vitals:   04/03/16 2148 04/04/16 0514 04/04/16 1137 04/04/16 1156  BP: 118/66 133/69 127/58   Pulse: 91 76  85  Temp: 98.8 F (37.1 C) 97.8 F (36.6 C)  97.5 F (36.4 C)  TempSrc: Oral Oral  Oral  Resp: _1 Height:      Weight:  75.297 kg (166 lb)    SpO2: 97% 97%  100%   Eyes: PERRL, lids and conjunctivae normal Respiratory:  no wheezing, faint bibasilar crackles. Normal respiratory effort. No accessory muscle use.  Cardiovascular: Regular rate and rhythm, no murmurs / rubs / gallops. 1+ pitting LE edema. 2+ pedal pulses.  Nero: non focal  Skin: no rashes / induration   Data Reviewed: I have personally reviewed following labs and imaging studies  CBC:  Recent Labs Lab  04/02/16 0452 04/03/16 0339 04/03/16 1052  WBC 4.1 5.5 6.0  HGB 12.7 12.5 14.1  HCT 39.8 40.2 44.6  MCV 92.6 92.6 92.7  PLT 259 256 694   Basic Metabolic Panel:  Recent Labs Lab 03/31/16 0329 04/01/16 0402 04/02/16 0452 04/03/16 0339 04/04/16 0422  NA 139 140 139 143 140  K 3.9 4.0 3.9 3.9 4.3  CL 101 102 101 100* 95*  CO2 _0 32 34*  GLUCOSE 231* 242* 265* 237* 309*  BUN 35* 23* 22* 17 26*  CREATININE 0.94 0.90 0.84 0.86 0.83  CALCIUM 8.3* 8.3* 8.7* 9.2 9.3   GFR: Estimated Creatinine Clearance: 55.7 mL/min (by C-G formula based on Cr of 0.83). Liver Function Tests: No results for input(s): AST, ALT, ALKPHOS, BILITOT, PROT, ALBUMIN in the  last 168 hours. No results for input(s): LIPASE, AMYLASE in the last 168 hours. No results for input(s): AMMONIA in the last 168 hours. Coagulation Profile: No results for input(s): INR, PROTIME in the last 168 hours. Cardiac Enzymes: No results for input(s): CKTOTAL, CKMB, CKMBINDEX, TROPONINI in the last 168 hours. BNP (last 3 results) No results for input(s): PROBNP in the last 8760 hours. HbA1C: No results for input(s): HGBA1C in the last 72 hours. CBG:  Recent Labs Lab 04/03/16 1200 04/03/16 1650 04/03/16 2152 04/04/16 0757 04/04/16 1133  GLUCAP 231* 250* 265* 254* 320*   Lipid Profile: No results for input(s): CHOL, HDL, LDLCALC, TRIG, CHOLHDL, LDLDIRECT in the last 72 hours. Thyroid Function Tests: No results for input(s): TSH, T4TOTAL, FREET4, T3FREE, THYROIDAB in the last 72 hours. Anemia Panel: No results for input(s): VITAMINB12, FOLATE, FERRITIN, TIBC, IRON, RETICCTPCT in the last 72 hours. Urine analysis: No results found for: COLORURINE, APPEARANCEUR, LABSPEC, PHURINE, GLUCOSEU, HGBUR, BILIRUBINUR, KETONESUR, PROTEINUR, UROBILINOGEN, NITRITE, LEUKOCYTESUR Sepsis Labs: Invalid input(s): PROCALCITONIN, LACTICIDVEN  No results found for this or any previous visit (from the past 240 hour(s)).    Radiology Studies: No results found.   Scheduled Meds: . allopurinol  300 mg Oral Daily  . aspirin EC  81 mg Oral Daily  . atorvastatin  40 mg Oral q1800  . diltiazem  30 mg Oral BID  . furosemide  80 mg Intravenous BID  . heparin  5,000 Units Subcutaneous Q8H  . insulin aspart  0-9 Units Subcutaneous TID WC  . insulin aspart  2 Units Subcutaneous TID WC  . insulin glargine  12 Units Subcutaneous Daily  . insulin starter kit- pen needles  1 kit Other Once  . living well with diabetes book   Does not apply Once  . loratadine  10 mg Oral Daily  . mometasone-formoterol  2 puff Inhalation BID  . omega-3 acid ethyl esters  1 g Oral Daily  . pantoprazole  40 mg Oral  Daily  . potassium chloride  40 mEq Oral Daily  . sildenafil  40 mg Oral TID  . sodium chloride flush  3 mL Intravenous Q12H  . sodium chloride flush  3 mL Intravenous Q12H  . sodium chloride flush  3 mL Intravenous Q12H  . sodium chloride flush  3 mL Intravenous Q12H  . tiotropium  18 mcg Inhalation Daily   Continuous Infusions:    Marzetta Board, MD, PhD [facility] Pager [phone]  If 7PM-7AM, please contact night-coverage [email or web address] Password [email or web address] 04/04/2016, 2:53 PM

## 2016-04-04 NOTE — Progress Notes (Signed)
Patient ID: Beth [name], female   DOB: 04/08/1941, 75 y.o.   MRN: [id number]     Subjective:    SOB improving but not resolved.   Objective:   Temp:  [97.8 F (36.6 C)-98.8 F (37.1 C)] 97.8 F (36.6 C) (05/20 0514) Pulse Rate:  [76-91] 76 (05/20 0514) Resp:  [18] 18 (05/20 0514) BP: (118-133)/(59-69) 133/69 mmHg (05/20 0514) SpO2:  [86 %-100 %] 97 % (05/20 0514) Weight:  [166 lb (75.297 kg)] 166 lb (75.297 kg) (05/20 0514) Last BM Date: 04/02/16  Filed Weights   04/02/16 0444 04/03/16 7416 04/04/16 0514  Weight: 172 lb 6.4 oz (78.2 kg) 168 lb 12.8 oz (76.567 kg) 166 lb (75.297 kg)    Intake/Output Summary (Last 24 hours) at 04/04/16 0936 Last data filed at 04/04/16 0854  Gross per 24 hour  Intake   1300 ml  Output   1900 ml  Net   -600 ml    Telemetry: SR, episodes of PSVT  Exam:  General: NAD  HEENT: sclera clear, throat clear  Resp: CTAB  Cardiac: RRR, no m/r/g, no jvd  GI: abdomen soft, NT, ND  MSK: trace bilateral LE edema  Neuro: no focal deficits  Psych: appropriate affect  Lab Results:  Basic Metabolic Panel:  Recent Labs Lab 04/02/16 0452 04/03/16 0339 04/04/16 0422  NA 139 143 140  K 3.9 3.9 4.3  CL 101 100* 95*  CO2 29 32 34*  GLUCOSE 265* 237* 309*  BUN 22* 17 26*  CREATININE 0.84 0.86 0.83  CALCIUM 8.7* 9.2 9.3    Liver Function Tests: No results for input(s): AST, ALT, ALKPHOS, BILITOT, PROT, ALBUMIN in the last 168 hours.  CBC:  Recent Labs Lab 04/02/16 0452 04/03/16 0339 04/03/16 1052  WBC 4.1 5.5 6.0  HGB 12.7 12.5 14.1  HCT 39.8 40.2 44.6  MCV 92.6 92.6 92.7  PLT 259 256 278    Cardiac Enzymes: No results for input(s): CKTOTAL, CKMB, CKMBINDEX, TROPONINI in the last 168 hours.  BNP: No results for input(s): PROBNP in the last 8760 hours.  Coagulation: No results for input(s): INR in the last 168 hours.  ECG:   Medications:   Scheduled Medications: . allopurinol  300 mg Oral Daily  . aspirin EC   81 mg Oral Daily  . atorvastatin  40 mg Oral q1800  . furosemide  80 mg Intravenous BID  . heparin  5,000 Units Subcutaneous Q8H  . insulin aspart  0-9 Units Subcutaneous TID WC  . insulin aspart  2 Units Subcutaneous TID WC  . insulin glargine  12 Units Subcutaneous Daily  . insulin starter kit- pen needles  1 kit Other Once  . living well with diabetes book   Does not apply Once  . loratadine  10 mg Oral Daily  . mometasone-formoterol  2 puff Inhalation BID  . omega-3 acid ethyl esters  1 g Oral Daily  . pantoprazole  40 mg Oral Daily  . potassium chloride  40 mEq Oral Daily  . sildenafil  40 mg Oral TID  . sodium chloride flush  3 mL Intravenous Q12H  . sodium chloride flush  3 mL Intravenous Q12H  . sodium chloride flush  3 mL Intravenous Q12H  . sodium chloride flush  3 mL Intravenous Q12H  . tiotropium  18 mcg Inhalation Daily     Infusions:     PRN Medications:  sodium chloride, sodium chloride, sodium chloride, acetaminophen, ipratropium-albuterol, ondansetron (ZOFRAN) IV, sodium chloride flush, sodium  chloride flush, sodium chloride flush, technetium TC 99M diethylenetriame-pentaacetic acid     Assessment/Plan   1. Pulmonary HTN - mean PA 66, PCWP 20. CI 1.7. 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 She has also has COPD suggesting pulmonary component, elevated wedge suggesting cardiac component. VQ scan negative.  - echo 03/2016 LVEF 84%, grade I diastolic dysfunction, D-shaped ventricular septum, severely dilated RV with moderate systolic dysfunction - started on revatio 65m tid, increased to 459mtid yesterday. Also diuresing with IV lasix 8076mV bid.   2. Acute on chronic diastolic HF - PCWP of 20 on RHC, she is being diuresed to lower wedge and also decrease RV overload as the ventricualar septum bows into the LV - on IV lasix 62m84md, negative 320mL69mcharting yesterday, negative 4.3 liters since admission. Renal  function remains overall stable. We will continue IV lasix today.   3. PSVT - we will add low dose diltiazem, start 30mg 74m    JonathCarlyle Dolly

## 2016-04-05 LAB — BASIC METABOLIC PANEL
ANION GAP: 12 (ref 5–15)
BUN: 26 mg/dL — ABNORMAL HIGH (ref 6–20)
CO2: 34 mmol/L — ABNORMAL HIGH (ref 22–32)
CREATININE: 0.97 mg/dL (ref 0.44–1.00)
Calcium: 9.2 mg/dL (ref 8.9–10.3)
Chloride: 89 mmol/L — ABNORMAL LOW (ref 101–111)
GFR, EST NON AFRICAN AMERICAN: 56 mL/min — AB (ref >60)
GLUCOSE: 293 mg/dL — AB (ref 65–99)
Potassium: 4.2 mmol/L (ref 3.5–5.1)
Sodium: 135 mmol/L (ref 135–145)

## 2016-04-05 LAB — GLUCOSE, CAPILLARY
GLUCOSE-CAPILLARY: 243 mg/dL — AB (ref 65–99)
GLUCOSE-CAPILLARY: 336 mg/dL — AB (ref 65–99)
GLUCOSE-CAPILLARY: 327 mg/dL — AB (ref 65–99)
Glucose-Capillary: 233 mg/dL — ABNORMAL HIGH (ref 65–99)

## 2016-04-05 LAB — MAGNESIUM: MAGNESIUM: 1.2 mg/dL — AB (ref 1.7–2.4)

## 2016-04-05 MED ORDER — INSULIN ASPART 100 UNIT/ML ~~LOC~~ SOLN
6.0000 [IU] | Freq: Three times a day (TID) | SUBCUTANEOUS | Status: DC
  Administered 2016-04-05 – 2016-04-06 (×3): 6 [IU] via SUBCUTANEOUS

## 2016-04-05 MED ORDER — FUROSEMIDE 40 MG PO TABS
40.0000 mg | ORAL_TABLET | Freq: Two times a day (BID) | ORAL | Status: DC
  Administered 2016-04-05 – 2016-04-06 (×2): 40 mg via ORAL
  Filled 2016-04-05 (×2): qty 1

## 2016-04-05 MED ORDER — MAGNESIUM SULFATE 2 GM/50ML IV SOLN
2.0000 g | Freq: Once | INTRAVENOUS | Status: AC
  Administered 2016-04-05: 2 g via INTRAVENOUS
  Filled 2016-04-05: qty 50

## 2016-04-05 NOTE — Progress Notes (Signed)
Patient refused CPAP use due to issues with the mask "smothering" her. RT will continue to monitor

## 2016-04-05 NOTE — Progress Notes (Signed)
Patient ID: LARAINA SULTON, female   DOB: January 16, 1941, 75 y.o.   MRN: [id number] Patient ID: LETISHA YERA, female   DOB: 1941/08/26, 75 y.o.   MRN: [id number]     Subjective:    SOB improving but not resolved.   Objective:   Temp:  [97.5 F (36.4 C)-98.9 F (37.2 C)] 98.7 F (37.1 C) (05/21 0300) Pulse Rate:  [66-97] 66 (05/21 0300) Resp:  [18] 18 (05/21 0300) BP: (123-152)/(58-81) 142/81 mmHg (05/21 0300) SpO2:  [97 %-100 %] 99 % (05/21 0300) Weight:  [164 lb 1.6 oz (74.435 kg)] 164 lb 1.6 oz (74.435 kg) (05/21 0300) Last BM Date: 04/03/16  Filed Weights   04/03/16 0638 04/04/16 0514 04/05/16 0300  Weight: 168 lb 12.8 oz (76.567 kg) 166 lb (75.297 kg) 164 lb 1.6 oz (74.435 kg)    Intake/Output Summary (Last 24 hours) at 04/05/16 0839 Last data filed at 04/05/16 0700  Gross per 24 hour  Intake   1602 ml  Output   2300 ml  Net   -698 ml    Telemetry: SR, episodes of PSVT  Exam:  General: NAD  HEENT: sclera clear, throat clear  Resp: CTAB  Cardiac: RRR, no m/r/g, no jvd  GI: abdomen soft, NT, ND  MSK: trace bilateral LE edema  Neuro: no focal deficits  Psych: appropriate affect  Lab Results:  Basic Metabolic Panel:  Recent Labs Lab 04/03/16 0339 04/04/16 0422 04/05/16 0448  NA 143 140 135  K 3.9 4.3 4.2  CL 100* 95* 89*  CO2 32 34* 34*  GLUCOSE 237* 309* 293*  BUN 17 26* 26*  CREATININE 0.86 0.83 0.97  CALCIUM 9.2 9.3 9.2  MG  --   --  1.2*    Liver Function Tests: No results for input(s): AST, ALT, ALKPHOS, BILITOT, PROT, ALBUMIN in the last 168 hours.  CBC:  Recent Labs Lab 04/02/16 0452 04/03/16 0339 04/03/16 1052  WBC 4.1 5.5 6.0  HGB 12.7 12.5 14.1  HCT 39.8 40.2 44.6  MCV 92.6 92.6 92.7  PLT 259 256 278    Cardiac Enzymes: No results for input(s): CKTOTAL, CKMB, CKMBINDEX, TROPONINI in the last 168 hours.  BNP: No results for input(s): PROBNP in the last 8760 hours.  Coagulation: No results for input(s): INR in the  last 168 hours.  ECG:   Medications:   Scheduled Medications: . allopurinol  300 mg Oral Daily  . aspirin EC  81 mg Oral Daily  . atorvastatin  40 mg Oral q1800  . diltiazem  30 mg Oral BID  . furosemide  80 mg Intravenous BID  . heparin  5,000 Units Subcutaneous Q8H  . insulin aspart  0-9 Units Subcutaneous TID WC  . insulin aspart  4 Units Subcutaneous TID WC  . insulin glargine  16 Units Subcutaneous Daily  . insulin starter kit- pen needles  1 kit Other Once  . living well with diabetes book   Does not apply Once  . loratadine  10 mg Oral Daily  . mometasone-formoterol  2 puff Inhalation BID  . omega-3 acid ethyl esters  1 g Oral Daily  . pantoprazole  40 mg Oral Daily  . potassium chloride  40 mEq Oral Daily  . sildenafil  40 mg Oral TID  . sodium chloride flush  3 mL Intravenous Q12H  . sodium chloride flush  3 mL Intravenous Q12H  . sodium chloride flush  3 mL Intravenous Q12H  . sodium chloride flush  3 mL  Intravenous Q12H  . tiotropium  18 mcg Inhalation Daily    Infusions:    PRN Medications: sodium chloride, sodium chloride, sodium chloride, acetaminophen, ipratropium-albuterol, ondansetron (ZOFRAN) IV, sodium chloride flush, sodium chloride flush, sodium chloride flush, technetium TC 68M diethylenetriame-pentaacetic acid     Assessment/Plan   1. Pulmonary HTN - mean PA 66, PCWP 20. CI 1.7. 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 She has also has COPD suggesting pulmonary component, elevated wedge suggesting cardiac component. VQ scan negative. +ANA, may need to be considered for outpatient rheum consult to help sort out possible autoimmune related pulm HTN - echo 03/2016 LVEF 03%, grade I diastolic dysfunction, D-shaped ventricular septum, severely dilated RV with moderate systolic dysfunction - started on revatio 69m tid, increased to 465mtid yesterday. Also diuresing with IV lasix 8061mV bid.    2. Acute on  chronic diastolic HF - PCWP of 20 on RHC, she is being diuresed to lower wedge and also decrease RV overload as the ventricualar septum bows into the LV - on IV lasix 50m11md, Negative 700 mL yesterday, negative 5.3 liters since admission. Mild uptrend in Cr and BUN.  - we will d/c IV lasix today, start 40mg24m oral.    3. PSVT - asymptomatic. - started dilt 30mg 46m- follow with Mg replacement, may need further titration of diltiazem  4. HypoMg - has had some muscle cramps overnight, PSVT on telemetry - I have written for 2 g IV.    Would monitor today, potential discharge tomorrow. CHF will resume rounds tomorrow to update there long term recs.  JonathCarlyle Dolly

## 2016-04-05 NOTE — Progress Notes (Signed)
PROGRESS NOTE  Beth [name]:[id number] DOB: April 16, 1941 DOA: 03/27/2016 PCP: Salena Saner., MD   LOS: 9 days   Brief Narrative: 75 y/o female with PMH of HTN, DM, dCHF, COPD presented with Progressive SOB, PND leg edema. Admitted with CHF, started on diuresis on admission then developed renal insufficiency.   Assessment & Plan: Active Problems:   CHF (congestive heart failure) (HCC)   Heart failure (HCC)   Acute pulmonary edema (HCC)   Essential hypertension   Dyslipidemia   Severe pulmonary hypertension  - new RV dilatation and depressed systolic function - cardiology following, status post right heart cath on 5/18 - Hold ARB/ACE due to renal issues - VQ scan to look for CTEPH unremarkable - She was started on Sildenafil in addition to diuresis. Monitor - net negative 4.9 L - breathing better - continue diuresis, transition to by mouth furosemide today   AKI - Likely due to lasix on top of hctz+irbesartan given on admission.  - renal improved and stable today   DM with hyperglycemia - ha1c-11.2. Uncontrolled.  - D/c metformin due to renal issues.  - CBG still ranging into the 240s 300s - continue to uptitrate insulin regimen today, continue Lantus 16 and increase scheduled 4>>6 + SSI  COPD  - No wheezing on exam. Continue current regimen   DVT prophylaxis: heparin Code Status: Full Family Communication: no family bedside Disposition Plan: home when ready  Consultants:   Cardiology   Procedures:   2D echo Impressions: - The left ventricle was compressed by the enlarged RV. LV EF 55%. Severely dilated RV with moderately decreased systolic function. Moderate pulmonary hypertension.  Antimicrobials:  None    Subjective: - no dyspnea at rest, but gets short winded when she moves around   Objective: Filed Vitals:   04/05/16 0300 04/05/16 0903 04/05/16 1118 04/05/16 1251  BP: 142/81  140/85 99/72  Pulse: 66   87  Temp: 98.7 F (37.1 C)   99.1  F (37.3 C)  TempSrc: Oral   Oral  Resp: 18   20  Height:      Weight: 74.435 kg (164 lb 1.6 oz)     SpO2: 99% 98%  98%    Intake/Output Summary (Last 24 hours) at 04/05/16 1413 Last data filed at 04/05/16 1300  Gross per 24 hour  Intake   1282 ml  Output   1450 ml  Net   -168 ml   Filed Weights   04/03/16 0638 04/04/16 0514 04/05/16 0300  Weight: 76.567 kg (168 lb 12.8 oz) 75.297 kg (166 lb) 74.435 kg (164 lb 1.6 oz)    Examination: Constitutional: NAD Filed Vitals:   04/05/16 0300 04/05/16 0903 04/05/16 1118 04/05/16 1251  BP: 142/81  140/85 99/72  Pulse: 66   87  Temp: 98.7 F (37.1 C)   99.1 F (37.3 C)  TempSrc: Oral   Oral  Resp: 18   20  Height:      Weight: 74.435 kg (164 lb 1.6 oz)     SpO2: 99% 98%  98%   Eyes: PERRL, lids and conjunctivae normal Respiratory:  no wheezing, faint bibasilar crackles. Normal respiratory effort. No accessory muscle use.  Cardiovascular: Regular rate and rhythm, no murmurs / rubs / gallops. 1+ pitting LE edema. 2+ pedal pulses.  Nero: non focal  Skin: no rashes / induration   Data Reviewed: I have personally reviewed following labs and imaging studies  CBC:  Recent Labs Lab 04/02/16 0452 04/03/16 0339 04/03/16 1052  WBC 4.1 5.5 6.0  HGB 12.7 12.5 14.1  HCT 39.8 40.2 44.6  MCV 92.6 92.6 92.7  PLT 259 256 197   Basic Metabolic Panel:  Recent Labs Lab 04/01/16 0402 04/02/16 0452 04/03/16 0339 04/04/16 0422 04/05/16 0448  NA 140 139 143 140 135  K 4.0 3.9 3.9 4.3 4.2  CL 102 101 100* 95* 89*  CO2 28 29 32 34* 34*  GLUCOSE 242* 265* 237* 309* 293*  BUN 23* 22* 17 26* 26*  CREATININE 0.90 0.84 0.86 0.83 0.97  CALCIUM 8.3* 8.7* 9.2 9.3 9.2  MG  --   --   --   --  1.2*   GFR: Estimated Creatinine Clearance: 47.3 mL/min (by C-G formula based on Cr of 0.97). Liver Function Tests: No results for input(s): AST, ALT, ALKPHOS, BILITOT, PROT, ALBUMIN in the last 168 hours. No results for input(s): LIPASE, AMYLASE  in the last 168 hours. No results for input(s): AMMONIA in the last 168 hours. Coagulation Profile: No results for input(s): INR, PROTIME in the last 168 hours. Cardiac Enzymes: No results for input(s): CKTOTAL, CKMB, CKMBINDEX, TROPONINI in the last 168 hours. BNP (last 3 results) No results for input(s): PROBNP in the last 8760 hours. HbA1C: No results for input(s): HGBA1C in the last 72 hours. CBG:  Recent Labs Lab 04/04/16 1133 04/04/16 1622 04/04/16 2129 04/05/16 0705 04/05/16 1203  GLUCAP 320* 360* 133* 243* 336*   Lipid Profile: No results for input(s): CHOL, HDL, LDLCALC, TRIG, CHOLHDL, LDLDIRECT in the last 72 hours. Thyroid Function Tests: No results for input(s): TSH, T4TOTAL, FREET4, T3FREE, THYROIDAB in the last 72 hours. Anemia Panel: No results for input(s): VITAMINB12, FOLATE, FERRITIN, TIBC, IRON, RETICCTPCT in the last 72 hours. Urine analysis: No results found for: COLORURINE, APPEARANCEUR, LABSPEC, PHURINE, GLUCOSEU, HGBUR, BILIRUBINUR, KETONESUR, PROTEINUR, UROBILINOGEN, NITRITE, LEUKOCYTESUR Sepsis Labs: Invalid input(s): PROCALCITONIN, LACTICIDVEN  No results found for this or any previous visit (from the past 240 hour(s)).    Radiology Studies: No results found.   Scheduled Meds: . allopurinol  300 mg Oral Daily  . aspirin EC  81 mg Oral Daily  . atorvastatin  40 mg Oral q1800  . diltiazem  30 mg Oral BID  . furosemide  40 mg Oral BID  . heparin  5,000 Units Subcutaneous Q8H  . insulin aspart  0-9 Units Subcutaneous TID WC  . insulin aspart  4 Units Subcutaneous TID WC  . insulin glargine  16 Units Subcutaneous Daily  . insulin starter kit- pen needles  1 kit Other Once  . living well with diabetes book   Does not apply Once  . loratadine  10 mg Oral Daily  . mometasone-formoterol  2 puff Inhalation BID  . omega-3 acid ethyl esters  1 g Oral Daily  . pantoprazole  40 mg Oral Daily  . potassium chloride  40 mEq Oral Daily  . sildenafil   40 mg Oral TID  . sodium chloride flush  3 mL Intravenous Q12H  . sodium chloride flush  3 mL Intravenous Q12H  . sodium chloride flush  3 mL Intravenous Q12H  . sodium chloride flush  3 mL Intravenous Q12H  . tiotropium  18 mcg Inhalation Daily   Continuous Infusions:    Marzetta Board, MD, PhD [facility] Pager [phone]  If 7PM-7AM, please contact night-coverage [email or web address] Password [email or web address] 04/05/2016, 2:13 PM

## 2016-04-06 ENCOUNTER — Telehealth (HOSPITAL_COMMUNITY): Payer: Self-pay | Admitting: Pharmacist

## 2016-04-06 LAB — BASIC METABOLIC PANEL
Anion gap: 8 (ref 5–15)
BUN: 25 mg/dL — AB (ref 6–20)
CHLORIDE: 96 mmol/L — AB (ref 101–111)
CO2: 32 mmol/L (ref 22–32)
Calcium: 9.2 mg/dL (ref 8.9–10.3)
Creatinine, Ser: 0.96 mg/dL (ref 0.44–1.00)
GFR calc Af Amer: >60 mL/min (ref >60)
GFR calc non Af Amer: 56 mL/min — ABNORMAL LOW (ref >60)
GLUCOSE: 206 mg/dL — AB (ref 65–99)
POTASSIUM: 4.7 mmol/L (ref 3.5–5.1)
Sodium: 136 mmol/L (ref 135–145)

## 2016-04-06 LAB — GLUCOSE, CAPILLARY
GLUCOSE-CAPILLARY: 177 mg/dL — AB (ref 65–99)
Glucose-Capillary: 232 mg/dL — ABNORMAL HIGH (ref 65–99)

## 2016-04-06 LAB — ANCA TITERS
Atypical P-ANCA titer: <1:20 {titer}
C-ANCA: <1:20 {titer}
P-ANCA: <1:20 {titer}

## 2016-04-06 LAB — MAGNESIUM: Magnesium: 1.9 mg/dL (ref 1.7–2.4)

## 2016-04-06 MED ORDER — FUROSEMIDE 40 MG PO TABS: 40.0000 mg | ORAL_TABLET | Freq: Two times a day (BID) | ORAL | Status: DC

## 2016-04-06 MED ORDER — METOPROLOL SUCCINATE ER 25 MG PO TB24: 25.0000 mg | ORAL_TABLET | Freq: Every day | ORAL | Status: DC

## 2016-04-06 MED ORDER — SILDENAFIL CITRATE 20 MG PO TABS: 60.0000 mg | ORAL_TABLET | Freq: Three times a day (TID) | ORAL | Status: DC

## 2016-04-06 MED ORDER — METOPROLOL SUCCINATE ER 25 MG PO TB24
25.0000 mg | ORAL_TABLET | Freq: Every day | ORAL | Status: DC
  Administered 2016-04-06: 25 mg via ORAL
  Filled 2016-04-06: qty 1

## 2016-04-06 MED ORDER — ASPIRIN 81 MG PO TBEC: 81.0000 mg | DELAYED_RELEASE_TABLET | Freq: Every day | ORAL | Status: DC

## 2016-04-06 MED ORDER — SILDENAFIL CITRATE 20 MG PO TABS
60.0000 mg | ORAL_TABLET | Freq: Three times a day (TID) | ORAL | Status: DC
  Administered 2016-04-06: 60 mg via ORAL
  Filled 2016-04-06: qty 3

## 2016-04-06 NOTE — Progress Notes (Addendum)
  Revatio unlikely to be approved at 60 TID per pharmacy.   We are working on getting her Adcirca 40 mg for d/c.       Legrand Como [address]" [place], PA-C 04/06/2016 10:29 AM    Addendum:  Revatio approved.  Please see note from Ileene Patrick, Pharm-D.    Legrand Como [address] [place], PA-C 04/06/2016 4:07 PM

## 2016-04-06 NOTE — Progress Notes (Signed)
Physical Therapy Treatment Patient Details Name: Beth [name] MRN: [id number] DOB: 05/27/1941 Today's Date: 04/06/2016    History of Present Illness Pt is a 75 y/o F who presented to the hospital w/ dyspnea and found to have pulmonary edema.  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     PT Comments    See also O2 qualification note. Patient initially steady without walker, however as O2 dropped, she began reaching for rails/doorframes to steady herself. Would benefit from rollator for energy conservation (can use seat/basket for carrying objects--including portable O2). She enjoys going shopping and out to eat with her daughter and hopes to resume these activities.   Follow Up Recommendations  Home health PT (intermittent assist for community nees)     Equipment Recommendations  Other (comment) (4 wheel walker with seat (rollator))    Recommendations for Other Services       Precautions / Restrictions Precautions Precautions: Fall Precaution Comments: monitor O2 Restrictions Weight Bearing Restrictions: No    Mobility  Bed Mobility Overal bed mobility: Modified Independent                Transfers Overall transfer level: Modified independent Equipment used: None Transfers: Sit to/from Stand Sit to Stand: Modified independent (Device/Increase time)            Ambulation/Gait Ambulation/Gait assistance: Supervision;Min assist Ambulation Distance (Feet): 190 Feet Assistive device: None;1 person hand held assist Gait Pattern/deviations: Step-through pattern;Decreased stride length;Drifts right/left   Gait velocity interpretation: Below normal speed for age/gender General Gait Details: Patient initially steady with no device; after ~60 ft she began reaching for rail and reported feeling "weak"; SaO2 on 1L had decr to 87%; incr to 2L with incr to 90%   Stairs            Wheelchair Mobility     Modified Rankin (Stroke Patients Only)       Balance     Sitting balance-Leahy Scale: Good       Standing balance-Leahy Scale: Fair                      Cognition Arousal/Alertness: Awake/alert Behavior During Therapy: WFL for tasks assessed/performed Overall Cognitive Status: Within Functional Limits for tasks assessed                      Exercises      General Comments General comments (skin integrity, edema, etc.): Patient does not want home O2, but by end of session she realized she does need it. Discussed pro's/con's of  2 wheel vs 4 wheel walker and pt would benefit from rollator for energy conservation (in addition for support for balance)      Pertinent Vitals/Pain Pain Assessment: No/denies pain    Home Living                      Prior Function            PT Goals (current goals can now be found in the care plan section) Acute Rehab PT Goals Patient Stated Goal: to improve her breathing, go out to eat with her Sr citizen group Time For Goal Achievement: 04/13/16 Progress towards PT goals: Progressing toward goals    Frequency  Min 3X/week    PT Plan Current plan remains appropriate    Co-evaluation  End of Session Equipment Utilized During Treatment: Gait belt;Oxygen Activity Tolerance: Patient limited by fatigue Patient left: in chair;with call bell/phone within reach;with chair alarm set     Time: 9935-7017 PT Time Calculation (min) (ACUTE ONLY): 25 min  Charges:  $Gait Training: 23-37 mins                    G Codes:      Elizabethann Lackey April 21, 2016, 9:39 AM  Pager [phone]

## 2016-04-06 NOTE — Progress Notes (Signed)
SATURATION QUALIFICATIONS: (This note is used to comply with regulatory documentation for home oxygen)  Patient Saturations on Room Air at Rest = 86%  Patient Saturations on Room Air while Ambulating = 84%  Patient Saturations on 2 Liters of oxygen while Ambulating = 90%  Please briefly explain why patient needs home oxygen: To maintain SaO2>88%   04/06/2016 Barry Brunner, PT Pager: [phone]

## 2016-04-06 NOTE — Discharge Summary (Signed)
Physician Discharge Summary  Beth [name] IEP:[id number] DOB: [birth year] DOA: 03/27/2016  PCP: Salena Saner., MD  Admit date: 03/27/2016 Discharge date: 04/06/2016  Time spent: > 30 minutes  Recommendations for Outpatient Follow-up:  1. Follow up with CHF clinic as scheduled in 9 days 2. Home health / home O2 on discharge  Discharge Diagnoses:  Active Problems:   CHF (congestive heart failure) (HCC)   Heart failure (HCC)   Acute pulmonary edema (HCC)   Essential hypertension   Dyslipidemia  Discharge Condition: stable  Diet recommendation: heart healthy, low salt  Filed Weights   04/04/16 0514 04/05/16 0300 04/06/16 0553  Weight: 75.297 kg (166 lb) 74.435 kg (164 lb 1.6 oz) 75.433 kg (166 lb 4.8 oz)    History of present illness:  See H&P, Labs, Consult and Test reports for all details in brief, patient is a 75 y/o female with PMH of HTN, DM, dCHF, COPD presented with Progressive SOB, PND leg edema. Admitted with CHF.  Hospital Course:  Severe pulmonary hypertension  - new RV dilatation and depressed systolic function - cardiology following, status post right heart cath on 5/18 - Hold ARB/ACE due to renal issues - VQ scan to look for CTEPH unremarkable - She was started on Sildenafil in addition to diuresis. - net negative 5.2 L, weights 175 on admission >> 166 on discharge - breathing better however will require O2 at home Acute hypoxic respiratory failure  - due to #1 - o2 at home AKI - Likely due to lasix on top of hctz+irbesartan given on admission.  - renal improved and stable DM with hyperglycemia - ha1c-11.2. Uncontrolled. PCP follow up COPD  - No wheezing on exam. Continue current regimen  Procedures:  2D echo Impressions: - The left ventricle was compressed by the enlarged RV. LV EF 55%. Severely dilated RV with moderately decreased systolic function. Moderate pulmonary hypertension.   Consultations:  Cardiology   Discharge  Exam: Filed Vitals:   04/05/16 1251 04/05/16 2011 04/05/16 2018 04/06/16 0553  BP: 99/72  105/45 124/74  Pulse: 87  97 83  Temp: 99.1 F (37.3 C)  98.2 F (36.8 C) 97.8 F (36.6 C)  TempSrc: Oral  Oral Oral  Resp: _0 Height:      Weight:    75.433 kg (166 lb 4.8 oz)  SpO2: 98% 98% 100% 97%    General: NAD Cardiovascular: RRR Respiratory: CTA biL  Discharge Instructions Activity:  As tolerated   Get Medicines reviewed and adjusted: Please take all your medications with you for your next visit with your Primary MD  Please request your Primary MD to go over all hospital tests and procedure/radiological results at the follow up, please ask your Primary MD to get all Hospital records sent to his/her office.  If you experience worsening of your admission symptoms, develop shortness of breath, life threatening emergency, suicidal or homicidal thoughts you must seek medical attention immediately by calling 911 or calling your MD immediately if symptoms less severe.  You must read complete instructions/literature along with all the possible adverse reactions/side effects for all the Medicines you take and that have been prescribed to you. Take any new Medicines after you have completely understood and accpet all the possible adverse reactions/side effects.   Do not drive when taking Pain medications.   Do not take more than prescribed Pain, Sleep and Anxiety Medications  Special Instructions: If you have smoked or chewed Tobacco in the last 2 yrs please  stop smoking, stop any regular Alcohol and or any Recreational drug use.  Wear Seat belts while driving.  Please note  You were cared for by a hospitalist during your hospital stay. Once you are discharged, your primary care physician will handle any further medical issues. Please note that NO REFILLS for any discharge medications will be authorized once you are discharged, as it is imperative that you return to your  primary care physician (or establish a relationship with a primary care physician if you do not have one) for your aftercare needs so that they can reassess your need for medications and monitor your lab values.    Medication List    STOP taking these medications        valsartan-hydrochlorothiazide 160-25 MG tablet  Commonly known as:  DIOVAN-HCT      TAKE these medications        albuterol 108 (90 Base) MCG/ACT inhaler  Commonly known as:  PROAIR HFA  2 puffs every 4 hours as needed only  if your can't catch your breath     albuterol 0.63 MG/3ML nebulizer solution  Commonly known as:  ACCUNEB  Take 1 ampule by nebulization every 6 (six) hours as needed for wheezing.     allopurinol 300 MG tablet  Commonly known as:  ZYLOPRIM  Take 300 mg by mouth daily.     aspirin 81 MG EC tablet  Take 1 tablet (81 mg total) by mouth daily.     atorvastatin 40 MG tablet  Commonly known as:  LIPITOR  Take 40 mg by mouth daily at 6 PM.     BEVESPI AEROSPHERE 9-4.8 MCG/ACT Aero  Generic drug:  Glycopyrrolate-Formoterol  Inhale 2 puffs into the lungs 2 (two) times daily.     SYMBICORT 160-4.5 MCG/ACT inhaler  Generic drug:  budesonide-formoterol  INHALE 2 PUFFS FIRST THING IN THE MORNING AND THEN ANOTHER 2 PUFFS ABOUT 12 HOURS LATER     budesonide-formoterol 160-4.5 MCG/ACT inhaler  Commonly known as:  SYMBICORT  INHALE 2 PUFFS FIRST THING IN THE MORNING AND THEN ANOTHER 2 PUFFS ABOUT 12 HOURS LATER     Cholecalciferol 1000 units tablet  Take 1,000 Units by mouth daily.     CoQ10 200 MG Caps  Take 200 mg by mouth daily.     DULERA 200-5 MCG/ACT Aero  Generic drug:  mometasone-formoterol  Inhale 2 puffs into the lungs 2 (two) times daily.     fexofenadine 180 MG tablet  Commonly known as:  ALLEGRA  Take 180 mg by mouth daily.     furosemide 40 MG tablet  Commonly known as:  LASIX  Take 1 tablet (40 mg total) by mouth 2 (two) times daily.     loratadine 10 MG tablet   Commonly known as:  CLARITIN  Take 1 tablet (10 mg total) by mouth daily.     meloxicam 15 MG tablet  Commonly known as:  MOBIC  Take 15 mg by mouth daily as needed for pain.     metoprolol succinate 25 MG 24 hr tablet  Commonly known as:  TOPROL-XL  Take 1 tablet (25 mg total) by mouth daily.     Omega 3 1000 MG Caps  Take 1 capsule by mouth daily.     omeprazole 40 MG capsule  Commonly known as:  PRILOSEC  Take 40 mg by mouth daily.     sildenafil 20 MG tablet  Commonly known as:  REVATIO  Take 3 tablets (60 mg total)  by mouth 3 (three) times daily.     SUPER B COMPLEX/VITAMIN C Tabs  Take 1 tablet by mouth daily.     TRULICITY 6.65 LD/3.5TS Sopn  Generic drug:  Dulaglutide  Inject 0.75 mg into the skin every Monday.           Follow-up Information    Follow up with [facility].   Why:  They will do your home health care at your home   Contact information:   [address] [place] [address] [phone]       Follow up with Salena Saner., MD. Daphane Shepherd on 04/09/2016.   Specialty:  Internal Medicine   Why:  _0 ;00pm   Contact information:   [address] [place] [address] [phone]       Follow up with Loralie Champagne, MD On 04/15/2016.   Specialty:  Cardiology   Why:  at 0930 for post hospital followup.  Please bring all of your medications to your visit.  The code for parking is 0020.   Contact information:   [address] [place] [address] [phone]       The results of significant diagnostics from this hospitalization (including imaging, microbiology, ancillary and laboratory) are listed below for reference.    Significant Diagnostic Studies: Dg Chest 2 View  03/27/2016  CLINICAL DATA:  Shortness of breath for 3 weeks. Chest tightness for 2 weeks EXAM: CHEST  2 VIEW COMPARISON:  Mar 16, 2016 FINDINGS: There is no edema or consolidation. There is slight scarring in the bases  bilaterally. Heart is upper normal in size with pulmonary vascularity within normal limits. There is atherosclerotic calcification in the aortic arch region. No adenopathy. No bone lesions. There are scattered foci of carotid artery calcification bilaterally. IMPRESSION: Slight bibasilar scarring. No edema or consolidation. Scattered foci of carotid artery calcification bilaterally. Electronically Signed   By: Lowella Grip III M.D.   On: 03/27/2016 13:55   Dg Chest 2 View  03/16/2016  CLINICAL DATA:  COPD.  Shortness of breath.  Smoker. EXAM: CHEST  2 VIEW COMPARISON:  12/04/2013 FINDINGS: Heart is borderline in size. Mild peribronchial thickening. No confluent opacities or effusions. Mild hyperinflation. No acute bony abnormality. IMPRESSION: COPD.  Mild bronchitic changes. Electronically Signed   By: Rolm Baptise M.D.   On: 03/16/2016 13:38   Nm Pulmonary Perf And Vent  04/02/2016  CLINICAL DATA:  Shortness of breath and chest pain for 2 day EXAM: NUCLEAR MEDICINE VENTILATION - PERFUSION LUNG SCAN Views: Anterior, posterior, left lateral, right lateral, RPO, LPO, RAO, LAO -ventilation and perfusion RADIOPHARMACEUTICALS:  29.8 mCi Technetium-91mDTPA aerosol inhalation and 4.0 mCi Technetium-968mAA IV COMPARISON:  Chest radiograph Mar 27, 2016 FINDINGS: Ventilation: There is photopenia in both mid lung regions in a nonsegmental distribution. Ventilation otherwise appears normal. Perfusion: There is mild photopenia in the mid lung regions bilaterally, matching the ventilation defects. There is no appreciable ventilation/perfusion mismatch on this study. No well-defined segmental perfusion defect identified. IMPRESSION: Matching ventilation perfusion midlung defects. No appreciable ventilation/perfusion mismatch. This study constitutes an overall low probability of pulmonary embolus. Electronically Signed   By: WiLowella GripII M.D.   On: 04/02/2016 14:30    Microbiology: No results found for this  or any previous visit (from the past 240 hour(s)).   Labs: Basic Metabolic Panel:  Recent Labs Lab 03/31/16 0329 04/01/16 0402 04/02/16 0457265/19/17 0320355/20/17 0422 04/05/16 0448 04/06/16 0245  NA  139 140 139 143 140 135 136  K 3.9 4.0 3.9 3.9 4.3 4.2 4.7  CL 101 102 101 100* 95* 89* 96*  CO2 _0 32 34* 34* 32  GLUCOSE 231* 242* 265* 237* 309* 293* 206*  BUN 35* 23* 22* 17 26* 26* 25*  CREATININE 0.94 0.90 0.84 0.86 0.83 0.97 0.96  CALCIUM 8.3* 8.3* 8.7* 9.2 9.3 9.2 9.2  MG  --   --   --   --   --  1.2* 1.9   Liver Function Tests: No results for input(s): AST, ALT, ALKPHOS, BILITOT, PROT, ALBUMIN in the last 168 hours. No results for input(s): LIPASE, AMYLASE in the last 168 hours. No results for input(s): AMMONIA in the last 168 hours. CBC:  Recent Labs Lab 04/02/16 0452 04/03/16 0339 04/03/16 1052  WBC 4.1 5.5 6.0  HGB 12.7 12.5 14.1  HCT 39.8 40.2 44.6  MCV 92.6 92.6 92.7  PLT 259 256 278   Cardiac Enzymes: No results for input(s): CKTOTAL, CKMB, CKMBINDEX, TROPONINI in the last 168 hours. BNP: BNP (last 3 results)  Recent Labs  03/27/16 1328  BNP 249.8*    ProBNP (last 3 results) No results for input(s): PROBNP in the last 8760 hours.  CBG:  Recent Labs Lab 04/05/16 1203 04/05/16 1631 04/05/16 2057 04/06/16 0739 04/06/16 1200  GLUCAP 336* 327* 233* 177* 232*       Signed:  Cleburn Maiolo  [facility] 04/06/2016, 4:49 PM

## 2016-04-06 NOTE — Progress Notes (Signed)
Approved for Revatio.   She is OK for d/c from HF perspective with HF follow up next week as below.    Follow up in [facility] On 04/15/2016.   Specialty:  Cardiology   Why:  at 0930 for post hospital followup.  Please bring all of your medications to your visit.  The code for parking is 0020.   Contact information:   [place] [address] [phone]       Beryle Beams" [place], [place] 04/06/2016 4:08 PM

## 2016-04-06 NOTE — Progress Notes (Signed)
Patient ID: SHATERRA SANZONE, female   DOB: Oct 19, 1941, 75 y.o.   MRN: [id number]     Patient Name: Beth [name] Date of Encounter: 04/06/2016   Patient Profile: Beth [name] is a 75 year old female with a past medical history of HTN, HLD, DM, OSA, CVA and chronic diastolic CHF.Admitted on 03/27/16 for worsening dyspnea and BLE edema, echo showed severe pulmonary HTN.   SUBJECTIVE: She feels much better.  Off oxygen.  Has been walking. Now on po Lasix.   [facility] Procedural Findings: Hemodynamics (mmHg) RA mean 20 RV 100/22 PA 110/45, mean 66 PCWP mean 20 Oxygen saturations: PA 54% AO 99% Cardiac Output (Fick) 3.06  Cardiac Index (Fick) 1.71 PVR 15 WU   OBJECTIVE Filed Vitals:   04/05/16 1251 04/05/16 2011 04/05/16 2018 04/06/16 0553  BP: 99/72  105/45 124/74  Pulse: 87  97 83  Temp: 99.1 F (37.3 C)  98.2 F (36.8 C) 97.8 F (36.6 C)  TempSrc: Oral  Oral Oral  Resp: _0 Height:      Weight:    166 lb 4.8 oz (75.433 kg)  SpO2: 98% 98% 100% 97%    Intake/Output Summary (Last 24 hours) at 04/06/16 0806 Last data filed at 04/06/16 0731  Gross per 24 hour  Intake    942 ml  Output   1177 ml  Net   -235 ml   Filed Weights   04/04/16 0514 04/05/16 0300 04/06/16 0553  Weight: 166 lb (75.297 kg) 164 lb 1.6 oz (74.435 kg) 166 lb 4.8 oz (75.433 kg)    PHYSICAL EXAM General: Well developed, well nourished, female in no acute distress. Head: Normocephalic, atraumatic.  Neck: Supple without bruits, JVP not elevated. Lungs:  Resp regular and unlabored, CTA. Heart: RRR, S1, S2, no S3, S4, or murmur; no rub. Abdomen: Soft, non-tender, non-distended, BS + x 4.  Extremities: No clubbing, cyanosis.  No edema.  Neuro: Alert and oriented X 3. Moves all extremities spontaneously. Psych: Normal affect.  LABS: CBC:  Recent Labs  04/03/16 1052  WBC 6.0  HGB 14.1  HCT 44.6  MCV 92.7  PLT 528   Basic Metabolic Panel:  Recent Labs  04/05/16 0448 04/06/16 0245  NA  135 136  K 4.2 4.7  CL 89* 96*  CO2 34* 32  GLUCOSE 293* 206*  BUN 26* 25*  CREATININE 0.97 0.96  CALCIUM 9.2 9.2  MG 1.2* 1.9   BNP:  B NATRIURETIC PEPTIDE  Date/Time Value Ref Range Status  03/27/2016 01:28 PM 249.8* 0.0 - 100.0 pg/mL Final   D-dimer:No results for input(s): DDIMER in the last 72 hours.   Current facility-administered medications:  .  0.9 %  sodium chloride infusion, 250 mL, Intravenous, PRN, Tawni Millers, MD .  0.9 %  sodium chloride infusion, 250 mL, Intravenous, PRN, Larey Dresser, MD .  0.9 %  sodium chloride infusion, 250 mL, Intravenous, PRN, Larey Dresser, MD .  acetaminophen (TYLENOL) tablet 650 mg, 650 mg, Oral, Q4H PRN, Larey Dresser, MD, 650 mg at 04/04/16 1152 .  allopurinol (ZYLOPRIM) tablet 300 mg, 300 mg, Oral, Daily, Mauricio Gerome Apley, MD, 300 mg at 04/05/16 1118 .  aspirin EC tablet 81 mg, 81 mg, Oral, Daily, Tawni Millers, MD, 81 mg at 04/05/16 1118 .  atorvastatin (LIPITOR) tablet 40 mg, 40 mg, Oral, q1800, Tawni Millers, MD, 40 mg at 04/05/16 1701 .  furosemide (LASIX) tablet 40 mg, 40 mg, Oral,  BID, Arnoldo Lenis, MD, 40 mg at 04/05/16 1701 .  heparin injection 5,000 Units, 5,000 Units, Subcutaneous, Q8H, Mauricio Gerome Apley, MD, 5,000 Units at 04/06/16 0732 .  insulin aspart (novoLOG) injection 0-9 Units, 0-9 Units, Subcutaneous, TID WC, Mauricio Gerome Apley, MD, 2 Units at 04/06/16 0740 .  insulin aspart (novoLOG) injection 6 Units, 6 Units, Subcutaneous, TID WC, Costin Karlyne Greenspan, MD, 6 Units at 04/05/16 1653 .  insulin glargine (LANTUS) injection 16 Units, 16 Units, Subcutaneous, Daily, Caren Griffins, MD, 16 Units at 04/05/16 1118 .  insulin starter kit- pen needles (English) 1 kit, 1 kit, Other, Once, Costin M Gherghe, MD .  ipratropium-albuterol (DUONEB) 0.5-2.5 (3) MG/3ML nebulizer solution 3 mL, 3 mL, Nebulization, Q4H PRN, Tawni Millers, MD .  living well with diabetes book  MISC, , Does not apply, Once, Costin Karlyne Greenspan, MD .  loratadine (CLARITIN) tablet 10 mg, 10 mg, Oral, Daily, Mauricio Gerome Apley, MD, 10 mg at 04/05/16 1119 .  metoprolol succinate (TOPROL-XL) 24 hr tablet 25 mg, 25 mg, Oral, Daily, Larey Dresser, MD .  mometasone-formoterol [facility]) 200-5 MCG/ACT inhaler 2 puff, 2 puff, Inhalation, BID, Mauricio Gerome Apley, MD, 2 puff at 04/05/16 2010 .  omega-3 acid ethyl esters (LOVAZA) capsule 1 g, 1 g, Oral, Daily, Mauricio Gerome Apley, MD, 1 g at 04/05/16 1123 .  ondansetron (ZOFRAN) injection 4 mg, 4 mg, Intravenous, Q6H PRN, Larey Dresser, MD .  pantoprazole (PROTONIX) EC tablet 40 mg, 40 mg, Oral, Daily, Mauricio Gerome Apley, MD, 40 mg at 04/05/16 1122 .  potassium chloride SA (K-DUR,KLOR-CON) CR tablet 40 mEq, 40 mEq, Oral, Daily, Larey Dresser, MD, 40 mEq at 04/05/16 1118 .  sildenafil (REVATIO) tablet 60 mg, 60 mg, Oral, TID, Larey Dresser, MD .  sodium chloride flush (NS) 0.9 % injection 3 mL, 3 mL, Intravenous, Q12H, Mauricio Gerome Apley, MD, 3 mL at 04/05/16 2300 .  sodium chloride flush (NS) 0.9 % injection 3 mL, 3 mL, Intravenous, PRN, Tawni Millers, MD .  sodium chloride flush (NS) 0.9 % injection 3 mL, 3 mL, Intravenous, Q12H, Mauricio Gerome Apley, MD, 3 mL at 04/02/16 1000 .  sodium chloride flush (NS) 0.9 % injection 3 mL, 3 mL, Intravenous, Q12H, Larey Dresser, MD, 3 mL at 04/04/16 2313 .  sodium chloride flush (NS) 0.9 % injection 3 mL, 3 mL, Intravenous, PRN, Larey Dresser, MD .  sodium chloride flush (NS) 0.9 % injection 3 mL, 3 mL, Intravenous, Q12H, Larey Dresser, MD, 3 mL at 04/05/16 1123 .  sodium chloride flush (NS) 0.9 % injection 3 mL, 3 mL, Intravenous, PRN, Larey Dresser, MD .  technetium TC 6M diethylenetriame-pentaacetic acid (DTPA) injection 29.8 milli Curie, 29.8 milli Curie, Intravenous, Once PRN, Abigail Miyamoto, MD .  tiotropium [facility]) inhalation capsule 18 mcg, 18 mcg, Inhalation,  Daily, Kinnie Feil, MD, 18 mcg at 04/05/16 0900    TELE: NSR  ECG: NSR   Radiology/Studies: No results found.   Current Medications:  . allopurinol  300 mg Oral Daily  . aspirin EC  81 mg Oral Daily  . atorvastatin  40 mg Oral q1800  . furosemide  40 mg Oral BID  . heparin  5,000 Units Subcutaneous Q8H  . insulin aspart  0-9 Units Subcutaneous TID WC  . insulin aspart  6 Units Subcutaneous TID WC  . insulin glargine  16 Units Subcutaneous Daily  . insulin starter kit- pen needles  1 kit Other Once  . living well with diabetes book   Does not apply Once  . loratadine  10 mg Oral Daily  . metoprolol succinate  25 mg Oral Daily  . mometasone-formoterol  2 puff Inhalation BID  . omega-3 acid ethyl esters  1 g Oral Daily  . pantoprazole  40 mg Oral Daily  . potassium chloride  40 mEq Oral Daily  . sildenafil  60 mg Oral TID  . sodium chloride flush  3 mL Intravenous Q12H  . sodium chloride flush  3 mL Intravenous Q12H  . sodium chloride flush  3 mL Intravenous Q12H  . sodium chloride flush  3 mL Intravenous Q12H  . tiotropium  18 mcg Inhalation Daily      ASSESSMENT AND PLAN: Active Problems:   CHF (congestive heart failure) (HCC)   Heart failure (HCC)   Acute pulmonary edema (HCC)   Essential hypertension   Dyslipidemia  1. Pulmonary Hypertension: Severe pulmonary hypertension, suspect mixed group 3 (COPD, untreated OSA) and group 1 PH (out of proportion to COPD). She was very short of breath with exertion and had very elevated right-sided filling pressures on cath. Though cardiac output low, do not think that I would jump towards IV Flolan given the mixed etiology of her PH. V/Q scan not suggestive of chronic PEs and autoimmune serologies sent (RF negative, ANCA negative, ANA only weakly positive - 1:80).  She is feeling much better now.  Got IV Lasix for several days and is breathing better.  Now on po Lasix.   - Continue Lasix 40 mg po bid for home (was on 40 daily  prior to admission).   - Increase Revatio to 60 mg tid. Will involve care management for help getting meds.  - I encouraged her to wear CPAP at home.  - Continue oxygen for COPD.  2. Chronic diastolic CHF: She has been diuresed, now on Lasix 40 mg po bid. 3. COPD: Gold class III. Followed by pulmonology, on LABA/ICS at home. She was a 30 pack year smoker, quit smoking 12-13 years ago. Contributory to her PAH.  4. OSA: Supposed to be wearing CPAP but has been scared of mask.  I have tried to encourage her to wear it in the hospital. 5. SVT: Several runs.  Started on Toprol XL 25 daily.  6. Disposition: From cardiac standpoint, I think she could go home today.  Will need close followup, I would like to see her in 1 week in CHF clinic.  Cardiac meds for home: sildenafil 60 mg tid, Lasix 40 mg bid, KDur 20 daily, ASA 81, atorvastatin 40 daily, Toprol XL 25 daily.  She will need assistance to get sildenafil.  Loralie Champagne 04/06/2016 8:06 AM

## 2016-04-06 NOTE — Telephone Encounter (Signed)
Sildenafil 60 mg TID PA approved by [employer] through 04/06/18.   [name]. Velva Harman, PharmD, BCPS, CPP Clinical Pharmacist Pager: [phone] Phone: [phone] 04/06/2016 4:05 PM

## 2016-04-06 NOTE — Progress Notes (Signed)
[facility] called to deliver home oxygen to room today and rolater. Patient is arranged with [facility] for [facility] Arna Snipe [phone]

## 2016-04-06 NOTE — Discharge Instructions (Signed)
Follow with Beth [name]., MD in 5-7 days  Please get a complete blood count and chemistry panel checked by your Primary MD at your next visit, and again as instructed by your Primary MD. Please get your medications reviewed and adjusted by your Primary MD.  Please request your Primary MD to go over all Hospital Tests and Procedure/Radiological results at the follow up, please get all Hospital records sent to your Prim MD by signing hospital release before you go home.  If you had Pneumonia of Lung problems at the Hospital: Please get a 2 view Chest X ray done in 6-8 weeks after hospital discharge or sooner if instructed by your Primary MD.  If you have Congestive Heart Failure: Please call your Cardiologist or Primary MD anytime you have any of the following symptoms:  1) 3 pound weight gain in 24 hours or 5 pounds in 1 week  2) shortness of breath, with or without a dry hacking cough  3) swelling in the hands, feet or stomach  4) if you have to sleep on extra pillows at night in order to breathe  Follow cardiac low salt diet and 1.5 lit/day fluid restriction.  If you have diabetes Accuchecks 4 times/day, Once in AM empty stomach and then before each meal. Log in all results and show them to your primary doctor at your next visit. If any glucose reading is under 80 or above 300 call your primary MD immediately.  If you have Seizure/Convulsions/Epilepsy: Please do not drive, operate heavy machinery, participate in activities at heights or participate in high speed sports until you have seen by Primary MD or a Neurologist and advised to do so again.  If you had Gastrointestinal Bleeding: Please ask your Primary MD to check a complete blood count within one week of discharge or at your next visit. Your endoscopic/colonoscopic biopsies that are pending at the time of discharge, will also need to followed by your Primary MD.  Get Medicines reviewed and adjusted. Please take all your  medications with you for your next visit with your Primary MD  Please request your Primary MD to go over all hospital tests and procedure/radiological results at the follow up, please ask your Primary MD to get all Hospital records sent to his/her office.  If you experience worsening of your admission symptoms, develop shortness of breath, life threatening emergency, suicidal or homicidal thoughts you must seek medical attention immediately by calling 911 or calling your MD immediately  if symptoms less severe.  You must read complete instructions/literature along with all the possible adverse reactions/side effects for all the Medicines you take and that have been prescribed to you. Take any new Medicines after you have completely understood and accpet all the possible adverse reactions/side effects.   Do not drive or operate heavy machinery when taking Pain medications.   Do not take more than prescribed Pain, Sleep and Anxiety Medications  Special Instructions: If you have smoked or chewed Tobacco  in the last 2 yrs please stop smoking, stop any regular Alcohol  and or any Recreational drug use.  Wear Seat belts while driving.  Please note You were cared for by a hospitalist during your hospital stay. If you have any questions about your discharge medications or the care you received while you were in the hospital after you are discharged, you can call the unit and asked to speak with the hospitalist on call if the hospitalist that took care of you is not available. Once  you are discharged, your primary care physician will handle any further medical issues. Please note that NO REFILLS for any discharge medications will be authorized once you are discharged, as it is imperative that you return to your primary care physician (or establish a relationship with a primary care physician if you do not have one) for your aftercare needs so that they can reassess your need for medications and monitor your  lab values.  You can reach the hospitalist office at phone [phone] or fax [phone]   If you do not have a primary care physician, you can call [phone] for a physician referral.  Activity: As tolerated with Full fall precautions use walker/cane & assistance as needed  Diet: heart healthy / diabetic  Disposition Home

## 2016-04-07 NOTE — Care Management Important Message (Signed)
Important Message  Patient Details  Name: Beth [name] MRN: [id number] Date of Birth: 1941-09-30   Medicare Important Message Given:  Yes    Loann Quill 04/07/2016, 1:24 PM

## 2016-04-14 NOTE — Progress Notes (Deleted)
error 

## 2016-04-15 ENCOUNTER — Ambulatory Visit (HOSPITAL_COMMUNITY)
Admission: RE | Admit: 2016-04-15 | Discharge: 2016-04-15 | Disposition: A | Discharge status: Home / Self Care | Payer: Commercial Managed Care - HMO | Source: Ambulatory Visit | Attending: Internal Medicine | Admitting: Internal Medicine

## 2016-04-15 VITALS — BP 136/72 | HR 84 | Wt 165.6 lb

## 2016-04-15 DIAGNOSIS — I509 Heart failure, unspecified: Secondary | ICD-10-CM | POA: Diagnosis not present

## 2016-04-15 DIAGNOSIS — I5032 Chronic diastolic (congestive) heart failure: Secondary | ICD-10-CM | POA: Insufficient documentation

## 2016-04-15 DIAGNOSIS — I471 Supraventricular tachycardia: Secondary | ICD-10-CM | POA: Diagnosis not present

## 2016-04-15 DIAGNOSIS — I272 Other secondary pulmonary hypertension: Secondary | ICD-10-CM

## 2016-04-15 DIAGNOSIS — I50810 Right heart failure, unspecified: Secondary | ICD-10-CM

## 2016-04-15 DIAGNOSIS — E119 Type 2 diabetes mellitus without complications: Secondary | ICD-10-CM | POA: Insufficient documentation

## 2016-04-15 DIAGNOSIS — Z8673 Personal history of transient ischemic attack (TIA), and cerebral infarction without residual deficits: Secondary | ICD-10-CM | POA: Insufficient documentation

## 2016-04-15 DIAGNOSIS — Z7982 Long term (current) use of aspirin: Secondary | ICD-10-CM | POA: Diagnosis not present

## 2016-04-15 DIAGNOSIS — Z9981 Dependence on supplemental oxygen: Secondary | ICD-10-CM | POA: Insufficient documentation

## 2016-04-15 DIAGNOSIS — Z79899 Other long term (current) drug therapy: Secondary | ICD-10-CM | POA: Insufficient documentation

## 2016-04-15 DIAGNOSIS — I110 Hypertensive heart disease with heart failure: Secondary | ICD-10-CM | POA: Insufficient documentation

## 2016-04-15 DIAGNOSIS — Z8249 Family history of ischemic heart disease and other diseases of the circulatory system: Secondary | ICD-10-CM | POA: Insufficient documentation

## 2016-04-15 DIAGNOSIS — Z7984 Long term (current) use of oral hypoglycemic drugs: Secondary | ICD-10-CM | POA: Insufficient documentation

## 2016-04-15 DIAGNOSIS — J810 Acute pulmonary edema: Secondary | ICD-10-CM | POA: Diagnosis not present

## 2016-04-15 DIAGNOSIS — E785 Hyperlipidemia, unspecified: Secondary | ICD-10-CM | POA: Diagnosis not present

## 2016-04-15 DIAGNOSIS — Z7951 Long term (current) use of inhaled steroids: Secondary | ICD-10-CM | POA: Insufficient documentation

## 2016-04-15 DIAGNOSIS — Z87891 Personal history of nicotine dependence: Secondary | ICD-10-CM | POA: Insufficient documentation

## 2016-04-15 DIAGNOSIS — J449 Chronic obstructive pulmonary disease, unspecified: Secondary | ICD-10-CM | POA: Insufficient documentation

## 2016-04-15 DIAGNOSIS — G4733 Obstructive sleep apnea (adult) (pediatric): Secondary | ICD-10-CM | POA: Diagnosis not present

## 2016-04-15 LAB — BASIC METABOLIC PANEL
ANION GAP: 8 (ref 5–15)
BUN: 44 mg/dL — ABNORMAL HIGH (ref 6–20)
CALCIUM: 9.4 mg/dL (ref 8.9–10.3)
CHLORIDE: 93 mmol/L — AB (ref 101–111)
CO2: 36 mmol/L — AB (ref 22–32)
CREATININE: 1.04 mg/dL — AB (ref 0.44–1.00)
GFR calc non Af Amer: 51 mL/min — ABNORMAL LOW (ref >60)
GFR, EST AFRICAN AMERICAN: 59 mL/min — AB (ref >60)
Glucose, Bld: 186 mg/dL — ABNORMAL HIGH (ref 65–99)
Potassium: 4 mmol/L (ref 3.5–5.1)
SODIUM: 137 mmol/L (ref 135–145)

## 2016-04-15 MED ORDER — MACITENTAN 10 MG PO TABS
10.0000 mg | ORAL_TABLET | Freq: Every day | ORAL | Status: DC
Start: 2016-04-15 — End: 2016-12-08

## 2016-04-15 MED ORDER — SILDENAFIL CITRATE 20 MG PO TABS: 80.0000 mg | ORAL_TABLET | Freq: Three times a day (TID) | ORAL | Status: DC

## 2016-04-15 NOTE — Progress Notes (Addendum)
Patient ID: Beth [name], female   DOB: 07-30-1941, 75 y.o.   MRN: [id number]  PCP: Dr Karlton Lemon Cardiology: Dr. Aundra Dubin  75 yo with history of COPD on home oxygen, untreated OSA, chronic diastolic CHF/RV failure, and pulmonary hypertension presents for cardiology followup.  She was admitted in 5/17 with acute on chronic diastolic CHF with prominent RV failure and marked shortness of breath.  She was diuresed with IV Lasix.  Echo showed dilated and dysfunctional RV with pulmonary hypertension.  RHC showed marked pulmonary hypertension with systemic PA pressure.  She was started on Revatio in the hospital and this was titrated up.  She felt better.   She is now home with her daughter.  Says she is breathing "1000 times better."  She is able to walk short distances with her walker without dyspnea.  Dyspnea with longer distances.  No orthopnea/PND.  Not able to tolerate CPAP. No chest pain.  No lightheadedness or syncope.   ECG: NSR, RVH, RBBB  Labs (5/17): K 4.7, creatinine 0.96  PMH: 1. H/o CVA 2. DM 3. HTN 4. Hyperlipidemia 5. OSA: Cannot tolerate CPAP, has tried multiple masks.  6. COPD: She is on home oxygen. 7. SVT 8. Chronic diastolic CHF with RV failure: Echo (5/17) with EF 55%, severely dilated RV with moderately decreased systolic function, D-shaped interventricular septum.  9. 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  V/Q scan 5/17 with no evidence for chronic PE.  ANA weakly positive (1:80), ANCA negative, RF negative.  RHC (5/17) with mean RA 20, PA 110/45 mean 66, mean PCWP 20, CI 1.7, PVR 15 WU.   Social History   Social History  . Marital Status: Widowed    Spouse Name: N/A  . Number of Children: 2  . Years of Education: N/A   Occupational History  . retired International aid/development worker [employer]   Social History Main Topics  . Smoking status: Former Smoker -- 1.50 packs/day for 40  years    Types: Cigarettes    Quit date: 11/16/2004  . Smokeless tobacco: Former Systems developer  . Alcohol Use: No     Comment: occassional  . Drug Use: No  . Sexual Activity: Not on file   Other Topics Concern  . Not on file   Social History Narrative   Family History  Problem Relation Age of Onset  . Heart disease Mother   . Heart disease Brother   . Heart disease Brother   . Heart disease Sister   . Heart disease Sister   . Heart failure Mother   . Heart failure Father    ROS: All systems reviewed and negative except as per HPI.   Current Outpatient Prescriptions  Medication Sig Dispense Refill  . albuterol (ACCUNEB) 0.63 MG/3ML nebulizer solution Take 1 ampule by nebulization every 6 (six) hours as needed for wheezing.     Marland Kitchen albuterol (PROAIR HFA) 108 (90 BASE) MCG/ACT inhaler 2 puffs every 4 hours as needed only  if your can't catch your breath (Patient taking differently: Inhale 2 puffs into the lungs every 4 (four) hours as needed for wheezing or shortness of breath. 2 puffs every 4 hours as needed only  if your can't catch your breath) 1 Inhaler 11  . allopurinol (ZYLOPRIM) 300 MG tablet Take 300 mg by mouth daily.    Marland Kitchen aspirin EC 81 MG EC tablet Take 1 tablet (81 mg  total) by mouth daily. 30 tablet 1  . atorvastatin (LIPITOR) 40 MG tablet Take 40 mg by mouth daily at 6 PM.    . Coenzyme Q10 (COQ10) 200 MG CAPS Take 200 mg by mouth daily.    . fexofenadine (ALLEGRA) 180 MG tablet Take 180 mg by mouth daily.    . furosemide (LASIX) 40 MG tablet Take 1 tablet (40 mg total) by mouth 2 (two) times daily. 60 tablet 1  . meloxicam (MOBIC) 15 MG tablet Take 15 mg by mouth daily as needed for pain.    . metFORMIN (GLUCOPHAGE) 500 MG tablet Take 500 mg by mouth 2 (two) times daily with a meal.    . metoprolol succinate (TOPROL-XL) 25 MG 24 hr tablet Take 1 tablet (25 mg total) by mouth daily. 30 tablet 1  . omeprazole (PRILOSEC) 40 MG capsule Take 40 mg by mouth daily.    .  valsartan-hydrochlorothiazide (DIOVAN-HCT) 160-25 MG tablet Take 1 tablet by mouth daily.    . B Complex-C (SUPER B COMPLEX/VITAMIN C) TABS Take 1 tablet by mouth daily.    . budesonide-formoterol (SYMBICORT) 160-4.5 MCG/ACT inhaler INHALE 2 PUFFS FIRST THING IN THE MORNING AND THEN ANOTHER 2 PUFFS ABOUT 12 HOURS LATER 1 Inhaler 11  . Cholecalciferol 1000 units tablet Take 1,000 Units by mouth daily.    . Dulaglutide (TRULICITY) 1.19 JY/7.8GN SOPN Inject 0.75 mg into the skin every Monday.    . Glycopyrrolate-Formoterol (BEVESPI AEROSPHERE) 9-4.8 MCG/ACT AERO Inhale 2 puffs into the lungs 2 (two) times daily.    Marland Kitchen loratadine (CLARITIN) 10 MG tablet Take 1 tablet (10 mg total) by mouth daily. 30 tablet 1  . Macitentan (OPSUMIT) 10 MG TABS Take 10 mg by mouth daily. 30 tablet 6  . mometasone-formoterol (DULERA) 200-5 MCG/ACT AERO Inhale 2 puffs into the lungs 2 (two) times daily.    . Omega 3 1000 MG CAPS Take 1 capsule by mouth daily.    . sildenafil (REVATIO) 20 MG tablet Take 4 tablets (80 mg total) by mouth 3 (three) times daily. 360 tablet 6  . SYMBICORT 160-4.5 MCG/ACT inhaler INHALE 2 PUFFS FIRST THING IN THE MORNING AND THEN ANOTHER 2 PUFFS ABOUT 12 HOURS LATER 1 Inhaler 11   No current facility-administered medications for this encounter.   BP 136/72 mmHg  Pulse 84  Wt 165 lb 9.6 oz (75.116 kg)  SpO2 87% General: NAD Neck: No JVD, no thyromegaly or thyroid nodule.  Lungs: Distant breath sounds.  CV: Nondisplaced PMI.  Heart regular S1/S2, no S3/S4, no murmur.  No peripheral edema.  No carotid bruit.  Normal pedal pulses.  Abdomen: Soft, nontender, no hepatosplenomegaly, no distention.  Skin: Intact without lesions or rashes.  Neurologic: Alert and oriented x 3.  Psych: Normal affect. Extremities: No clubbing or cyanosis.  HEENT: Normal.   Assessment/Plan: 1. Pulmonary hypertension: Severe pulmonary hypertension, suspect mixed group 3 (COPD, untreated OSA) and group 1 PH (out of  proportion to COPD). She was very short of breath with exertion and had very elevated right-sided filling pressures on 5/17 RHC.  PA pressure was systemic. Though cardiac output low, do not think that I would jump towards IV Flolan given the mixed etiology of her PH. V/Q scan not suggestive of chronic PEs and autoimmune serologies sent (RF negative, ANCA negative, ANA only weakly positive - 1:80). She is feeling much better on combination of Lasix and Revatio.  - Increase Revatio to 80 mg tid.  - I will start the  paperwork to get her on Opsumit 10 mg daily.  - 6 minute walk next appt.   2. COPD: On home oxygen.  Prior long-time smoker.  I will refer her to pulmonary rehab.  3. Chronic diastolic CHF with prominent RV failure: She looks near-euvolemic on exam now.   - Continue current Lasix.  - BMET/BNP today.   4. SVT: No palpitations.  She is on Toprol XL.  5. OSA: Has been unable to tolerate CPAP.   Loralie Champagne 04/15/2016

## 2016-04-15 NOTE — Patient Instructions (Signed)
INCREASE Revatio to 80 mg (4 tabs) three times a day START Opsumit 10 mg, one tab daily  You have been referred to Pulmonary Rehab- they will contact you for orinetation  Your physician recommends that you schedule a follow-up appointment in: 3 weeks with Dr.McLean  Do the following things EVERYDAY: 1) Weigh yourself in the morning before breakfast. Write it down and keep it in a log. 2) Take your medicines as prescribed 3) Eat low salt foods-Limit salt (sodium) to 2000 mg per day.  4) Stay as active as you can everyday 5) Limit all fluids for the day to less than 2 liters 6)

## 2016-04-16 ENCOUNTER — Other Ambulatory Visit: Payer: Self-pay

## 2016-04-16 ENCOUNTER — Telehealth (HOSPITAL_COMMUNITY): Payer: Self-pay | Admitting: *Deleted

## 2016-04-16 MED ORDER — FUROSEMIDE 40 MG PO TABS: ORAL_TABLET | ORAL | Status: DC

## 2016-04-16 NOTE — Patient Outreach (Signed)
Outreach call made to the patient and spoke with her daughter, Jaclyn Prime at [phone], HIPAA verified.  Follow up regarding the patient's primary care provider is not in the [place] registry.  Verbalized understanding.   States her mother had her appointment with the [facility] with Dr. Aundra Dubin and they had referred her to one of their program.    Natividad Brood, RN BSN [facility] Liaison  [phone] business mobile phone Toll free office [phone]

## 2016-04-16 NOTE — Telephone Encounter (Signed)
Notes Recorded by Harvie Junior, CMA on 04/16/2016 at 9:45 AM Pt aware of medication changes. Medication updated in patients chart. Notes Recorded by Larey Dresser, MD on 04/15/2016 at 10:34 PM Decrease Lasix to 40 qam/20 qpm.

## 2016-04-17 ENCOUNTER — Other Ambulatory Visit (HOSPITAL_COMMUNITY): Payer: Self-pay | Admitting: *Deleted

## 2016-04-17 ENCOUNTER — Telehealth (HOSPITAL_COMMUNITY): Payer: Self-pay | Admitting: *Deleted

## 2016-04-17 NOTE — Telephone Encounter (Signed)
At OV 5/31 pt's Sildenafil was increased to 80 mg (4 tabs) Three times a day qty 360 for 30 day supply, and she increased to that dose that day.  She states she took her last tabs last night and has no more and the pharmacy told her she is not able to fill it at this time.  Called and spoke [facility] they state it needs a PA since we increased dose.  [facility] and completed PA on the phone, ref # [id number], they state it is sent to review and has a 24-72 turn around time.  Called Pharmacy back and ask if any of the previous prescriptions for the lower dose/quantity would go through, pharmacist was able to get Sil 60 mg TID #90 (10 day supply) to go through insurance with a $15 copay, she will fill this for pt today so that pt will have tabs while we are waiting on PA.  Pt aware and agreeable to pick up tabs today

## 2016-04-24 ENCOUNTER — Other Ambulatory Visit (HOSPITAL_COMMUNITY): Payer: Self-pay | Admitting: *Deleted

## 2016-05-07 ENCOUNTER — Telehealth (HOSPITAL_COMMUNITY): Payer: Self-pay | Admitting: *Deleted

## 2016-05-07 NOTE — Telephone Encounter (Signed)
Completed PA for pt's Opsumit, med approved through 05/06/18, [employer] aware

## 2016-05-15 ENCOUNTER — Telehealth (HOSPITAL_COMMUNITY): Payer: Self-pay

## 2016-05-15 NOTE — Telephone Encounter (Signed)
Freda Munro with [employer] called to report patient unable to afford copay with [facility] and would like to move forward to bridge patient onto patient assistance process. VO given over the phone.  Renee Pain

## 2016-05-18 ENCOUNTER — Other Ambulatory Visit: Payer: Self-pay

## 2016-05-18 NOTE — Patient Outreach (Signed)
[place] [facility]) Care Management  05/18/2016  Yarelin R Dorr 1941-01-10 [id number]     EMMI-HF RED ON EMMI ALERT Day # 37 Date: 05/14/16 Red Alert Reason: "New/worsenig problems? Yes"   Outreach attempt #1 to patient. No answer at present. Voicemail box full and unable to leave message.   Plan: RN CM will make outreach attempt to patient within three business days.    Enzo Montgomery, RN,BSN,CCM [facility] Telephonic Care Management Coordinator Direct Phone: [phone] Toll Free: [phone] Fax: [phone]

## 2016-05-20 ENCOUNTER — Other Ambulatory Visit: Payer: Self-pay

## 2016-05-20 NOTE — Patient Outreach (Signed)
[place] [facility]) Care Management  05/20/2016  Beth [name] 1941/06/04 [id number]   EMMI-HF RED ON EMMI ALERT Day # 37 Date: 05/14/16 Red Alert Reason: "New/worsenig problems? Yes"   Outreach attempt # 2 to patient. Spoke with patient. Addressed and discussed red alert. Patient states that machine must have misunderstood her. She denies any new or worsening symptoms. She reports that she is doing fairly well. She has all her meds. No issues with transportation. No RN CM needs at this time. Patient appreciative of follow up call.   Plan: RN CM will notify [facility] administrative assistant of case closure.  Enzo Montgomery, RN,BSN,CCM [facility] Telephonic Care Management Coordinator Direct Phone: [phone] Toll Free: [phone] Fax: [phone]

## 2016-05-25 ENCOUNTER — Encounter (HOSPITAL_COMMUNITY): Payer: Self-pay

## 2016-05-25 ENCOUNTER — Ambulatory Visit (HOSPITAL_COMMUNITY)
Admission: RE | Admit: 2016-05-25 | Discharge: 2016-05-25 | Disposition: A | Discharge status: Home / Self Care | Payer: Medicare HMO | Source: Ambulatory Visit | Attending: Cardiology | Admitting: Cardiology

## 2016-05-25 VITALS — BP 126/72 | HR 97 | Wt 162.0 lb

## 2016-05-25 DIAGNOSIS — I5032 Chronic diastolic (congestive) heart failure: Secondary | ICD-10-CM | POA: Diagnosis present

## 2016-05-25 DIAGNOSIS — E785 Hyperlipidemia, unspecified: Secondary | ICD-10-CM | POA: Insufficient documentation

## 2016-05-25 DIAGNOSIS — Z9981 Dependence on supplemental oxygen: Secondary | ICD-10-CM | POA: Diagnosis not present

## 2016-05-25 DIAGNOSIS — I5040 Unspecified combined systolic (congestive) and diastolic (congestive) heart failure: Secondary | ICD-10-CM

## 2016-05-25 DIAGNOSIS — Z79899 Other long term (current) drug therapy: Secondary | ICD-10-CM | POA: Diagnosis not present

## 2016-05-25 DIAGNOSIS — I110 Hypertensive heart disease with heart failure: Secondary | ICD-10-CM | POA: Diagnosis not present

## 2016-05-25 DIAGNOSIS — J449 Chronic obstructive pulmonary disease, unspecified: Secondary | ICD-10-CM | POA: Insufficient documentation

## 2016-05-25 DIAGNOSIS — Z8249 Family history of ischemic heart disease and other diseases of the circulatory system: Secondary | ICD-10-CM | POA: Diagnosis not present

## 2016-05-25 DIAGNOSIS — I471 Supraventricular tachycardia: Secondary | ICD-10-CM | POA: Insufficient documentation

## 2016-05-25 DIAGNOSIS — Z7984 Long term (current) use of oral hypoglycemic drugs: Secondary | ICD-10-CM | POA: Insufficient documentation

## 2016-05-25 DIAGNOSIS — R0602 Shortness of breath: Secondary | ICD-10-CM | POA: Diagnosis not present

## 2016-05-25 DIAGNOSIS — I4510 Unspecified right bundle-branch block: Secondary | ICD-10-CM | POA: Insufficient documentation

## 2016-05-25 DIAGNOSIS — Z8673 Personal history of transient ischemic attack (TIA), and cerebral infarction without residual deficits: Secondary | ICD-10-CM | POA: Diagnosis not present

## 2016-05-25 DIAGNOSIS — I272 Other secondary pulmonary hypertension: Secondary | ICD-10-CM

## 2016-05-25 DIAGNOSIS — Z87891 Personal history of nicotine dependence: Secondary | ICD-10-CM | POA: Diagnosis not present

## 2016-05-25 DIAGNOSIS — Z7982 Long term (current) use of aspirin: Secondary | ICD-10-CM | POA: Diagnosis not present

## 2016-05-25 DIAGNOSIS — E119 Type 2 diabetes mellitus without complications: Secondary | ICD-10-CM | POA: Insufficient documentation

## 2016-05-25 DIAGNOSIS — G4733 Obstructive sleep apnea (adult) (pediatric): Secondary | ICD-10-CM | POA: Diagnosis not present

## 2016-05-25 LAB — BASIC METABOLIC PANEL
Anion gap: 12 (ref 5–15)
BUN: 48 mg/dL — AB (ref 6–20)
CO2: 27 mmol/L (ref 22–32)
CREATININE: 1.46 mg/dL — AB (ref 0.44–1.00)
Calcium: 10.1 mg/dL (ref 8.9–10.3)
Chloride: 98 mmol/L — ABNORMAL LOW (ref 101–111)
GFR calc Af Amer: 39 mL/min — ABNORMAL LOW (ref >60)
GFR, EST NON AFRICAN AMERICAN: 34 mL/min — AB (ref >60)
GLUCOSE: 108 mg/dL — AB (ref 65–99)
Potassium: 3.6 mmol/L (ref 3.5–5.1)
SODIUM: 137 mmol/L (ref 135–145)

## 2016-05-25 LAB — BRAIN NATRIURETIC PEPTIDE: B NATRIURETIC PEPTIDE 5: 22.1 pg/mL (ref 0.0–100.0)

## 2016-05-25 NOTE — Patient Instructions (Signed)
Labs today  Your physician recommends that you schedule a follow-up appointment in: 2 months    

## 2016-05-25 NOTE — Progress Notes (Signed)
6 min walk completed: pt ambulated 780 ft (237.7 m) on 2 L O2 sats ranged 86-95% HR ranged 95-145

## 2016-05-26 NOTE — Progress Notes (Signed)
Patient ID: Beth [name], female   DOB: 1941-09-14, 75 y.o.   MRN: [id number] PCP: Dr Karlton Lemon Cardiology: Dr. Aundra Dubin  75 yo with history of COPD on home oxygen, untreated OSA, chronic diastolic CHF/RV failure, and pulmonary hypertension presents for cardiology followup.  She was admitted in 5/17 with acute on chronic diastolic CHF with prominent RV failure and marked shortness of breath.  She was diuresed with IV Lasix.  Echo showed dilated and dysfunctional RV with pulmonary hypertension.  RHC showed marked pulmonary hypertension with systemic PA pressure.  She was started on Revatio in the hospital and this was titrated up.  Subsequently, she was started on macitentan.   She is now home with her daughter.  Breathing a lot better.  No dyspnea walking short distances on flat ground.  Dyspnea with longer distances.  No orthopnea/PND.  Not able to tolerate CPAP. No chest pain.  No lightheadedness or syncope.  Weight down 3 lbs.    ECG: NSR, RVH, RBBB  6 minute walk (7/17): 238 m  Labs (5/17): K 4.7 => 4, creatinine 0.96 => 1.04  PMH: 1. H/o CVA 2. DM 3. HTN 4. Hyperlipidemia 5. OSA: Cannot tolerate CPAP, has tried multiple masks.  6. COPD: She is on home oxygen. 7. SVT 8. Chronic diastolic CHF with RV failure: Echo (5/17) with EF 55%, severely dilated RV with moderately decreased systolic function, D-shaped interventricular septum.  9. 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  V/Q scan 5/17 with no evidence for chronic PE.  ANA weakly positive (1:80), ANCA negative, RF negative.  RHC (5/17) with mean RA 20, PA 110/45 mean 66, mean PCWP 20, CI 1.7, PVR 15 WU.   Social History   Social History  . Marital Status: Widowed    Spouse Name: N/A  . Number of Children: 2  . Years of Education: N/A   Occupational History  . retired International aid/development worker [employer]   Social History Main Topics  .  Smoking status: Former Smoker -- 1.50 packs/day for 40 years    Types: Cigarettes    Quit date: 11/16/2004  . Smokeless tobacco: Former Systems developer  . Alcohol Use: No     Comment: occassional  . Drug Use: No  . Sexual Activity: Not on file   Other Topics Concern  . Not on file   Social History Narrative   Family History  Problem Relation Age of Onset  . Heart disease Mother   . Heart disease Brother   . Heart disease Brother   . Heart disease Sister   . Heart disease Sister   . Heart failure Mother   . Heart failure Father    ROS: All systems reviewed and negative except as per HPI.   Current Outpatient Prescriptions  Medication Sig Dispense Refill  . albuterol (ACCUNEB) 0.63 MG/3ML nebulizer solution Take 1 ampule by nebulization every 6 (six) hours as needed for wheezing.     Marland Kitchen albuterol (PROAIR HFA) 108 (90 BASE) MCG/ACT inhaler 2 puffs every 4 hours as needed only  if your can't catch your breath (Patient taking differently: Inhale 1 puff into the lungs every 6 (six) hours as needed. 2 puffs every 4 hours as needed only  if your can't catch your breath) 1 Inhaler 11  . allopurinol (ZYLOPRIM) 300 MG tablet Take 300 mg by mouth daily.    Marland Kitchen aspirin EC 81 MG EC  tablet Take 1 tablet (81 mg total) by mouth daily. 30 tablet 1  . atorvastatin (LIPITOR) 40 MG tablet Take 40 mg by mouth daily at 6 PM.    . B Complex-C (SUPER B COMPLEX/VITAMIN C) TABS Take 1 tablet by mouth daily.    . budesonide-formoterol (SYMBICORT) 160-4.5 MCG/ACT inhaler INHALE 2 PUFFS FIRST THING IN THE MORNING AND THEN ANOTHER 2 PUFFS ABOUT 12 HOURS LATER 1 Inhaler 11  . Cholecalciferol 1000 units tablet Take 1,000 Units by mouth daily.    . Coenzyme Q10 (COQ10) 200 MG CAPS Take 200 mg by mouth daily.    . Dulaglutide (TRULICITY) 3.41 PF/7.9KW SOPN Inject 0.75 mg into the skin every Monday.    . fexofenadine (ALLEGRA) 180 MG tablet Take 180 mg by mouth daily.    . furosemide (LASIX) 40 MG tablet Take 40 mg by mouth 2  (two) times daily.    . Glycopyrrolate-Formoterol (BEVESPI AEROSPHERE) 9-4.8 MCG/ACT AERO Inhale 2 puffs into the lungs 2 (two) times daily.    Marland Kitchen loratadine (CLARITIN) 10 MG tablet Take 1 tablet (10 mg total) by mouth daily. 30 tablet 1  . Macitentan (OPSUMIT) 10 MG TABS Take 10 mg by mouth daily. 30 tablet 6  . meloxicam (MOBIC) 15 MG tablet Take 15 mg by mouth daily as needed for pain.    . metFORMIN (GLUCOPHAGE) 500 MG tablet Take 500 mg by mouth 2 (two) times daily with a meal.    . metoprolol succinate (TOPROL-XL) 25 MG 24 hr tablet Take 1 tablet (25 mg total) by mouth daily. 30 tablet 1  . mometasone-formoterol (DULERA) 200-5 MCG/ACT AERO Inhale 2 puffs into the lungs 2 (two) times daily.    . Omega 3 1000 MG CAPS Take 1 capsule by mouth daily.    Marland Kitchen omeprazole (PRILOSEC) 40 MG capsule Take 40 mg by mouth daily.    . sildenafil (REVATIO) 20 MG tablet Take 4 tablets (80 mg total) by mouth 3 (three) times daily. 360 tablet 6  . SYMBICORT 160-4.5 MCG/ACT inhaler INHALE 2 PUFFS FIRST THING IN THE MORNING AND THEN ANOTHER 2 PUFFS ABOUT 12 HOURS LATER 1 Inhaler 11  . valsartan-hydrochlorothiazide (DIOVAN-HCT) 160-25 MG tablet Take 1 tablet by mouth daily.     No current facility-administered medications for this encounter.   BP 126/72 mmHg  Pulse 97  Wt 162 lb (73.483 kg)  SpO2 99% General: NAD Neck: No JVD, no thyromegaly or thyroid nodule.  Lungs: Distant breath sounds.  CV: Nondisplaced PMI.  Heart regular S1/S2, no S3/S4, no murmur.  No peripheral edema.  No carotid bruit.  Normal pedal pulses.  Abdomen: Soft, nontender, no hepatosplenomegaly, no distention.  Skin: Intact without lesions or rashes.  Neurologic: Alert and oriented x 3.  Psych: Normal affect. Extremities: No clubbing or cyanosis.  HEENT: Normal.   Assessment/Plan: 1. 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 She was  very short of breath with exertion and had very elevated right-sided filling pressures on 5/17 RHC.  PA pressure was systemic. Though cardiac output low, do not think that I would jump towards IV Flolan given the mixed etiology of her PH. V/Q scan not suggestive of chronic PEs and autoimmune serologies sent (RF negative, ANCA negative, ANA only weakly positive - 1:80). She is feeling much better on combination of Lasix, macitentan, and Revatio.  - Continue Revatio 80 mg tid.  - Continue Opsumit 10 mg  daily.  - 6 minute walk was done today, she did fairly well.   2. COPD: On home oxygen.  Prior long-time smoker.   3. Chronic diastolic CHF with prominent RV failure: She looks near-euvolemic on exam now.   - Continue current Lasix.  - BMET/BNP today.   4. SVT: No palpitations.  She is on Toprol XL.  5. OSA: Has been unable to tolerate CPAP.   Followup in 2 months.   Loralie Champagne 05/26/2016

## 2016-05-27 ENCOUNTER — Other Ambulatory Visit (HOSPITAL_COMMUNITY): Payer: Self-pay | Admitting: Cardiology

## 2016-05-27 DIAGNOSIS — I509 Heart failure, unspecified: Secondary | ICD-10-CM

## 2016-05-27 MED ORDER — FUROSEMIDE 40 MG PO TABS: 40.0000 mg | ORAL_TABLET | Freq: Two times a day (BID) | ORAL | Status: DC

## 2016-05-27 MED ORDER — ASPIRIN 81 MG PO TBEC: 81.0000 mg | DELAYED_RELEASE_TABLET | Freq: Every day | ORAL | Status: DC

## 2016-05-27 MED ORDER — METOPROLOL SUCCINATE ER 25 MG PO TB24: 25.0000 mg | ORAL_TABLET | Freq: Every day | ORAL | Status: DC

## 2016-06-03 ENCOUNTER — Telehealth (HOSPITAL_COMMUNITY): Payer: Self-pay | Admitting: *Deleted

## 2016-06-03 NOTE — Telephone Encounter (Signed)
Dr.Sheltons office called requesting to leave a message for [employer].  A referral was received with [facility] paperwork from [place]. [employer] is requesting a call back from [place] case #[id number]

## 2016-06-04 NOTE — Telephone Encounter (Signed)
Returned call to [facility]- paperwork sent to Dr Roland Earl office was a [place] referral request Per [employer] call reference # [id number], with patient current [employer] health plan, no referral is needed

## 2016-06-05 ENCOUNTER — Telehealth (HOSPITAL_COMMUNITY): Payer: Self-pay | Admitting: *Deleted

## 2016-06-05 ENCOUNTER — Ambulatory Visit (HOSPITAL_COMMUNITY)
Admission: RE | Admit: 2016-06-05 | Discharge: 2016-06-05 | Disposition: A | Discharge status: Home / Self Care | Payer: Medicare HMO | Source: Ambulatory Visit | Attending: Cardiology | Admitting: Cardiology

## 2016-06-05 DIAGNOSIS — I5040 Unspecified combined systolic (congestive) and diastolic (congestive) heart failure: Secondary | ICD-10-CM

## 2016-06-05 DIAGNOSIS — I5023 Acute on chronic systolic (congestive) heart failure: Secondary | ICD-10-CM | POA: Diagnosis not present

## 2016-06-05 LAB — BASIC METABOLIC PANEL
ANION GAP: 9 (ref 5–15)
BUN: 51 mg/dL — AB (ref 6–20)
CO2: 30 mmol/L (ref 22–32)
Calcium: 9.6 mg/dL (ref 8.9–10.3)
Chloride: 101 mmol/L (ref 101–111)
Creatinine, Ser: 1.44 mg/dL — ABNORMAL HIGH (ref 0.44–1.00)
GFR calc Af Amer: 40 mL/min — ABNORMAL LOW (ref >60)
GFR, EST NON AFRICAN AMERICAN: 35 mL/min — AB (ref >60)
GLUCOSE: 112 mg/dL — AB (ref 65–99)
POTASSIUM: 3.8 mmol/L (ref 3.5–5.1)
Sodium: 140 mmol/L (ref 135–145)

## 2016-06-05 NOTE — Telephone Encounter (Signed)
-----  Message from Larey Dresser, MD sent at 06/05/2016  4:01 PM EDT ----- No change for now but repeat BMET in 2 wks.

## 2016-06-05 NOTE — Telephone Encounter (Signed)
Notes Recorded by Scarlette Calico, RN on 06/05/2016 at 4:35 PM Patient aware. Repeat labs sch 8/4

## 2016-06-19 ENCOUNTER — Telehealth (HOSPITAL_COMMUNITY): Payer: Self-pay

## 2016-06-19 ENCOUNTER — Other Ambulatory Visit (HOSPITAL_COMMUNITY): Payer: Commercial Managed Care - HMO

## 2016-06-19 NOTE — Telephone Encounter (Signed)
Patient left message on CHF triage line to call her back with unspecified needs/concerns. Left return VM on her line to call us back.  Renee Pain

## 2016-06-19 NOTE — Telephone Encounter (Signed)
Patient returned call to let CHF clinic know she had to reschedule todays lab apt due to her O2 tanks being out and needing [facility] to come to home to replace them.  Apt rescheduled for Monday the 7th for routine bemt.  Renee Pain RN

## 2016-06-22 ENCOUNTER — Ambulatory Visit (HOSPITAL_COMMUNITY)
Admission: RE | Admit: 2016-06-22 | Discharge: 2016-06-22 | Disposition: A | Discharge status: Home / Self Care | Payer: Commercial Managed Care - HMO | Source: Ambulatory Visit | Attending: Internal Medicine | Admitting: Internal Medicine

## 2016-06-22 DIAGNOSIS — I5040 Unspecified combined systolic (congestive) and diastolic (congestive) heart failure: Secondary | ICD-10-CM | POA: Insufficient documentation

## 2016-06-22 LAB — BASIC METABOLIC PANEL
ANION GAP: 13 (ref 5–15)
BUN: 83 mg/dL — ABNORMAL HIGH (ref 6–20)
CALCIUM: 9.4 mg/dL (ref 8.9–10.3)
CHLORIDE: 97 mmol/L — AB (ref 101–111)
CO2: 26 mmol/L (ref 22–32)
CREATININE: 2.93 mg/dL — AB (ref 0.44–1.00)
GFR, EST AFRICAN AMERICAN: 17 mL/min — AB (ref >60)
GFR, EST NON AFRICAN AMERICAN: 15 mL/min — AB (ref >60)
Glucose, Bld: 98 mg/dL (ref 65–99)
Potassium: 4.2 mmol/L (ref 3.5–5.1)
SODIUM: 136 mmol/L (ref 135–145)

## 2016-06-24 ENCOUNTER — Telehealth (HOSPITAL_COMMUNITY): Payer: Self-pay | Admitting: Cardiology

## 2016-06-24 DIAGNOSIS — I509 Heart failure, unspecified: Secondary | ICD-10-CM

## 2016-06-24 NOTE — Telephone Encounter (Signed)
-----  Message from Larey Dresser, MD sent at 06/24/2016  8:57 AM EDT ----- Hold Lasix today, then decrease to 40 mg daily alternating with 20 mg daily.  BMET 1 week.

## 2016-06-24 NOTE — Telephone Encounter (Signed)
Patient aware. And voiced understanding. Repeat labs 8/18 (or as transportation is available)

## 2016-06-29 ENCOUNTER — Emergency Department (HOSPITAL_COMMUNITY)
Admission: EM | Admit: 2016-06-29 | Discharge: 2016-06-29 | Disposition: A | Discharge status: Other Acute Inpatient Hospital | Payer: Medicare HMO | Attending: Emergency Medicine | Admitting: Emergency Medicine

## 2016-06-29 ENCOUNTER — Emergency Department (HOSPITAL_COMMUNITY)
Admit: 2016-06-29 | Discharge: 2016-06-29 | Disposition: A | Discharge status: Home / Self Care | Payer: Medicare HMO | Attending: Internal Medicine | Admitting: Internal Medicine

## 2016-06-29 ENCOUNTER — Other Ambulatory Visit (HOSPITAL_COMMUNITY)
Admission: RE | Admit: 2016-06-29 | Discharge: 2016-06-29 | Disposition: A | Discharge status: Home / Self Care | Payer: Medicare HMO | Source: Ambulatory Visit | Attending: Cardiology | Admitting: Cardiology

## 2016-06-29 ENCOUNTER — Telehealth: Payer: Self-pay | Admitting: Physician Assistant

## 2016-06-29 DIAGNOSIS — Z029 Encounter for administrative examinations, unspecified: Secondary | ICD-10-CM | POA: Diagnosis present

## 2016-06-29 DIAGNOSIS — I509 Heart failure, unspecified: Secondary | ICD-10-CM

## 2016-06-29 LAB — BASIC METABOLIC PANEL
Anion gap: 9 (ref 5–15)
BUN: 67 mg/dL — ABNORMAL HIGH (ref 6–20)
CHLORIDE: 105 mmol/L (ref 101–111)
CO2: 28 mmol/L (ref 22–32)
Calcium: 9.2 mg/dL (ref 8.9–10.3)
Creatinine, Ser: 1.51 mg/dL — ABNORMAL HIGH (ref 0.44–1.00)
GFR calc Af Amer: 38 mL/min — ABNORMAL LOW (ref >60)
GFR, EST NON AFRICAN AMERICAN: 33 mL/min — AB (ref >60)
GLUCOSE: 113 mg/dL — AB (ref 65–99)
POTASSIUM: 4.1 mmol/L (ref 3.5–5.1)
Sodium: 142 mmol/L (ref 135–145)

## 2016-06-29 NOTE — ED Notes (Signed)
Patient checked in wrong area advised to go to main lab my MD per patient.

## 2016-06-29 NOTE — Telephone Encounter (Signed)
Pt called asking for garage code. Relayed to her that per Dr. Aundra Dubin it is [phone]. She copied it down and expressed gratitude. Adie Vilar PA-C

## 2016-07-03 ENCOUNTER — Other Ambulatory Visit (HOSPITAL_COMMUNITY): Payer: Commercial Managed Care - HMO

## 2016-07-23 ENCOUNTER — Telehealth (HOSPITAL_COMMUNITY): Payer: Self-pay | Admitting: *Deleted

## 2016-07-23 NOTE — Telephone Encounter (Signed)
Pt called concerned about itching/rash, she states this has been going on since mid-end of Aug,s he did pcp who gave her med which didn't seem to help.  She wonders if it could be from her Opsumit, she states this the last med added to her list.  Advised highly unlikely since pt has been on Opsumit since May.  She also denies new soap/detergents.  Advised to f/u with pcp or may need dermatologist, she is agreeable

## 2016-07-31 ENCOUNTER — Ambulatory Visit (HOSPITAL_COMMUNITY)
Admission: RE | Admit: 2016-07-31 | Discharge: 2016-07-31 | Disposition: A | Discharge status: Home / Self Care | Payer: Medicare HMO | Source: Ambulatory Visit | Attending: Cardiology | Admitting: Cardiology

## 2016-07-31 VITALS — BP 116/80 | HR 113 | Wt 154.0 lb

## 2016-07-31 DIAGNOSIS — Z7984 Long term (current) use of oral hypoglycemic drugs: Secondary | ICD-10-CM | POA: Diagnosis not present

## 2016-07-31 DIAGNOSIS — Z7982 Long term (current) use of aspirin: Secondary | ICD-10-CM | POA: Insufficient documentation

## 2016-07-31 DIAGNOSIS — G4733 Obstructive sleep apnea (adult) (pediatric): Secondary | ICD-10-CM | POA: Insufficient documentation

## 2016-07-31 DIAGNOSIS — Z8249 Family history of ischemic heart disease and other diseases of the circulatory system: Secondary | ICD-10-CM | POA: Insufficient documentation

## 2016-07-31 DIAGNOSIS — I5032 Chronic diastolic (congestive) heart failure: Secondary | ICD-10-CM | POA: Diagnosis not present

## 2016-07-31 DIAGNOSIS — Z87891 Personal history of nicotine dependence: Secondary | ICD-10-CM | POA: Diagnosis not present

## 2016-07-31 DIAGNOSIS — Z9981 Dependence on supplemental oxygen: Secondary | ICD-10-CM | POA: Insufficient documentation

## 2016-07-31 DIAGNOSIS — Z8673 Personal history of transient ischemic attack (TIA), and cerebral infarction without residual deficits: Secondary | ICD-10-CM | POA: Diagnosis not present

## 2016-07-31 DIAGNOSIS — Z7951 Long term (current) use of inhaled steroids: Secondary | ICD-10-CM | POA: Insufficient documentation

## 2016-07-31 DIAGNOSIS — I509 Heart failure, unspecified: Secondary | ICD-10-CM

## 2016-07-31 DIAGNOSIS — E785 Hyperlipidemia, unspecified: Secondary | ICD-10-CM | POA: Diagnosis not present

## 2016-07-31 DIAGNOSIS — N189 Chronic kidney disease, unspecified: Secondary | ICD-10-CM | POA: Insufficient documentation

## 2016-07-31 DIAGNOSIS — Z79899 Other long term (current) drug therapy: Secondary | ICD-10-CM | POA: Diagnosis not present

## 2016-07-31 DIAGNOSIS — R21 Rash and other nonspecific skin eruption: Secondary | ICD-10-CM | POA: Diagnosis not present

## 2016-07-31 DIAGNOSIS — I50810 Right heart failure, unspecified: Secondary | ICD-10-CM

## 2016-07-31 DIAGNOSIS — I272 Other secondary pulmonary hypertension: Secondary | ICD-10-CM | POA: Diagnosis not present

## 2016-07-31 DIAGNOSIS — I130 Hypertensive heart and chronic kidney disease with heart failure and stage 1 through stage 4 chronic kidney disease, or unspecified chronic kidney disease: Secondary | ICD-10-CM | POA: Insufficient documentation

## 2016-07-31 DIAGNOSIS — E1122 Type 2 diabetes mellitus with diabetic chronic kidney disease: Secondary | ICD-10-CM | POA: Insufficient documentation

## 2016-07-31 DIAGNOSIS — I471 Supraventricular tachycardia: Secondary | ICD-10-CM | POA: Insufficient documentation

## 2016-07-31 DIAGNOSIS — J449 Chronic obstructive pulmonary disease, unspecified: Secondary | ICD-10-CM | POA: Insufficient documentation

## 2016-07-31 DIAGNOSIS — I5040 Unspecified combined systolic (congestive) and diastolic (congestive) heart failure: Secondary | ICD-10-CM

## 2016-07-31 LAB — BASIC METABOLIC PANEL
Anion gap: 13 (ref 5–15)
BUN: 44 mg/dL — AB (ref 6–20)
CHLORIDE: 103 mmol/L (ref 101–111)
CO2: 25 mmol/L (ref 22–32)
CREATININE: 1.33 mg/dL — AB (ref 0.44–1.00)
Calcium: 9.1 mg/dL (ref 8.9–10.3)
GFR calc Af Amer: 44 mL/min — ABNORMAL LOW (ref >60)
GFR, EST NON AFRICAN AMERICAN: 38 mL/min — AB (ref >60)
GLUCOSE: 88 mg/dL (ref 65–99)
POTASSIUM: 3.9 mmol/L (ref 3.5–5.1)
SODIUM: 141 mmol/L (ref 135–145)

## 2016-07-31 LAB — BRAIN NATRIURETIC PEPTIDE: B NATRIURETIC PEPTIDE 5: 15.3 pg/mL (ref 0.0–100.0)

## 2016-07-31 MED ORDER — SELEXIPAG 1000 MCG PO TABS: 1.0000 | ORAL_TABLET | Freq: Every day | ORAL | 3 refills | Status: DC

## 2016-07-31 MED ORDER — FUROSEMIDE 40 MG PO TABS: 40.0000 mg | ORAL_TABLET | Freq: Every day | ORAL | 3 refills | Status: DC

## 2016-07-31 NOTE — Patient Instructions (Addendum)
DECREASE Lasix to 40 mg once daily.  Will start you on Selexipag. CHF Clinical Pharmacist Doroteo Bradford will be in touch with you Monday to set up.  Routine lab work today. Will notify you of abnormal results, otherwise no news is good news!  Repeat labs in 1-2 weeks.  Will refer you to dermatology.  Follow up 6 weeks with Dr. Aundra Dubin.  Do the following things EVERYDAY: 1) Weigh yourself in the morning before breakfast. Write it down and keep it in a log. 2) Take your medicines as prescribed 3) Eat low salt foods-Limit salt (sodium) to 2000 mg per day.  4) Stay as active as you can everyday 5) Limit all fluids for the day to less than 2 liters

## 2016-08-02 NOTE — Progress Notes (Signed)
Patient ID: Beth [name], female   DOB: 13-Dec-1940, 75 y.o.   MRN: [id number] PCP: Dr Karlton Lemon Cardiology: Dr. Aundra Dubin  75 yo with history of COPD on home oxygen, untreated OSA, chronic diastolic CHF/RV failure, and pulmonary hypertension presents for cardiology followup.  She was admitted in 5/17 with acute on chronic diastolic CHF with prominent RV failure and marked shortness of breath.  She was diuresed with IV Lasix.  Echo showed dilated and dysfunctional RV with pulmonary hypertension.  RHC showed marked pulmonary hypertension with systemic PA pressure.  She was started on Revatio in the hospital and this was titrated up.  Subsequently, she was started on macitentan.   Breathing is overall better but she has developed a pruritic, erythematous rash more on her arms than anywhere else.  This has been present since August.  No dyspnea generally when walking short distances on flat ground.  No orthopnea/PND.  No chest pain.  No lightheadedness.  Weight is down 8 lbs.      ECG: NSR, RVH, RBBB  6 minute walk (7/17): 238 m  Labs (5/17): K 4.7 => 4, creatinine 0.96 => 1.04 Labs (8/17): K 4.1, creatinine 1.5  PMH: 1. H/o CVA 2. DM 3. HTN 4. Hyperlipidemia 5. OSA: Cannot tolerate CPAP, has tried multiple masks.  6. COPD: She is on home oxygen. 7. SVT 8. Chronic diastolic CHF with RV failure: Echo (5/17) with EF 55%, severely dilated RV with moderately decreased systolic function, D-shaped interventricular septum.  9. 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  V/Q scan 5/17 with no evidence for chronic PE.  ANA weakly positive (1:80), ANCA negative, RF negative.  RHC (5/17) with mean RA 20, PA 110/45 mean 66, mean PCWP 20, CI 1.7, PVR 15 WU.  10. CKD  Social History   Social History  . Marital status: Widowed    Spouse name: N/A  . Number of children: 2  . Years of education: N/A    Occupational History  . retired International aid/development worker [employer]   Social History Main Topics  . Smoking status: Former Smoker    Packs/day: 1.50    Years: 40.00    Types: Cigarettes    Quit date: 11/16/2004  . Smokeless tobacco: Former Systems developer  . Alcohol use No     Comment: occassional  . Drug use: No  . Sexual activity: Not on file   Other Topics Concern  . Not on file   Social History Narrative  . No narrative on file   Family History  Problem Relation Age of Onset  . Heart disease Mother   . Heart disease Brother   . Heart disease Brother   . Heart disease Sister   . Heart disease Sister   . Heart failure Mother   . Heart failure Father    ROS: All systems reviewed and negative except as per HPI.   Current Outpatient Prescriptions  Medication Sig Dispense Refill  . albuterol (ACCUNEB) 0.63 MG/3ML nebulizer solution Take 1 ampule by nebulization every 6 (six) hours as needed for wheezing.     Marland Kitchen albuterol (PROAIR HFA) 108 (90 BASE) MCG/ACT inhaler 2 puffs every 4 hours as needed only  if your can't catch your breath (Patient taking differently: Inhale 1 puff into the lungs every 6 (six) hours as needed. 2 puffs every 4 hours as needed only  if your can't catch your breath)  1 Inhaler 11  . allopurinol (ZYLOPRIM) 300 MG tablet Take 300 mg by mouth daily.    Marland Kitchen aspirin 81 MG EC tablet Take 1 tablet (81 mg total) by mouth daily. 90 tablet 3  . atorvastatin (LIPITOR) 40 MG tablet Take 40 mg by mouth daily at 6 PM.    . B Complex-C (SUPER B COMPLEX/VITAMIN C) TABS Take 1 tablet by mouth daily.    . budesonide-formoterol (SYMBICORT) 160-4.5 MCG/ACT inhaler INHALE 2 PUFFS FIRST THING IN THE MORNING AND THEN ANOTHER 2 PUFFS ABOUT 12 HOURS LATER 1 Inhaler 11  . Cholecalciferol 1000 units tablet Take 1,000 Units by mouth daily.    . Coenzyme Q10 (COQ10) 200 MG CAPS Take 200 mg by mouth daily.    . Dulaglutide (TRULICITY) 4.09 WJ/1.9JY SOPN Inject 0.75 mg into the skin every Monday.    .  fexofenadine (ALLEGRA) 180 MG tablet Take 180 mg by mouth daily.    . furosemide (LASIX) 40 MG tablet Take 1 tablet (40 mg total) by mouth daily. 90 tablet 3  . Glycopyrrolate-Formoterol (BEVESPI AEROSPHERE) 9-4.8 MCG/ACT AERO Inhale 2 puffs into the lungs 2 (two) times daily.    Marland Kitchen loratadine (CLARITIN) 10 MG tablet Take 1 tablet (10 mg total) by mouth daily. 30 tablet 1  . Macitentan (OPSUMIT) 10 MG TABS Take 10 mg by mouth daily. 30 tablet 6  . meloxicam (MOBIC) 15 MG tablet Take 15 mg by mouth daily as needed for pain.    . metFORMIN (GLUCOPHAGE) 500 MG tablet Take 500 mg by mouth 2 (two) times daily with a meal.    . metoprolol succinate (TOPROL-XL) 25 MG 24 hr tablet Take 1 tablet (25 mg total) by mouth daily. 90 tablet 1  . mometasone-formoterol (DULERA) 200-5 MCG/ACT AERO Inhale 2 puffs into the lungs 2 (two) times daily.    . Omega 3 1000 MG CAPS Take 1 capsule by mouth daily.    Marland Kitchen omeprazole (PRILOSEC) 40 MG capsule Take 40 mg by mouth daily.    . sildenafil (REVATIO) 20 MG tablet Take 4 tablets (80 mg total) by mouth 3 (three) times daily. 360 tablet 6  . SYMBICORT 160-4.5 MCG/ACT inhaler INHALE 2 PUFFS FIRST THING IN THE MORNING AND THEN ANOTHER 2 PUFFS ABOUT 12 HOURS LATER 1 Inhaler 11  . valsartan-hydrochlorothiazide (DIOVAN-HCT) 160-25 MG tablet Take 1 tablet by mouth daily.    . Selexipag 1000 MCG TABS Take 1 tablet by mouth daily. 30 tablet 3   No current facility-administered medications for this encounter.    BP 116/80 (BP Location: Left Arm, Patient Position: Sitting, Cuff Size: Normal)   Pulse (!) 113   Wt 154 lb (69.9 kg)   SpO2 94% Comment: on 2L  BMI 28.17 kg/m  General: NAD Neck: JVP 8 cm, no thyromegaly or thyroid nodule.  Lungs: Distant breath sounds.  CV: Nondisplaced PMI.  Heart regular S1/S2, no S3/S4, no murmur.  1+ ankle/foot edema.  No carotid bruit.  Normal pedal pulses.  Abdomen: Soft, nontender, no hepatosplenomegaly, no distention.  Skin: Erythematous  rash primarily on the arms.  Neurologic: Alert and oriented x 3.  Psych: Normal affect. Extremities: No clubbing or cyanosis.  HEENT: Normal.   Assessment/Plan: 1. 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 She was very short of breath with exertion and had very elevated right-sided filling pressures on 5/17 RHC.  PA pressure was systemic. Though cardiac output  low, do not think that I would jump towards IV Flolan given the mixed etiology of her PH. V/Q scan not suggestive of chronic PEs and autoimmune serologies sent (RF negative, ANCA negative, ANA only weakly positive - 1:80). She is feeling much better on combination of Lasix, macitentan, and Revatio.  - Continue Revatio 80 mg tid.  - Continue Opsumit 10 mg daily.  - Given her improvement with PH meds, I will work on getting her Selexipag.   2. COPD: On home oxygen.  Prior long-time smoker.   3. Chronic diastolic CHF with prominent RV failure: Mild volume overload on exam today.   - Increase Lasix to 40 mg daily.   - BMET/BNP today and repeat BMET in 10 days.   4. SVT: No palpitations.  She is on Toprol XL.  5. OSA: Has been unable to tolerate CPAP.  6. Rash: Erythematous, pruritic rash primarily on her arms, since August.  Possible drug reaction.  Very difficult to tell which drug.  I will try to arrange a dermatology appt for evaluation.   Followup in 6 wks with 6 minute walk  Loralie Champagne 08/02/2016

## 2016-08-07 ENCOUNTER — Telehealth (HOSPITAL_COMMUNITY): Payer: Self-pay

## 2016-08-07 NOTE — Telephone Encounter (Signed)
Patient called CHF clinic to report she is no longer itching and would like to cancel her dermatology referral at this time.  Will cancel referral/apt.  Renee Pain, RN

## 2016-08-14 ENCOUNTER — Ambulatory Visit (HOSPITAL_COMMUNITY)
Admission: RE | Admit: 2016-08-14 | Discharge: 2016-08-14 | Disposition: A | Discharge status: Home / Self Care | Payer: Commercial Managed Care - HMO | Source: Ambulatory Visit | Attending: Cardiology | Admitting: Cardiology

## 2016-08-14 DIAGNOSIS — I5040 Unspecified combined systolic (congestive) and diastolic (congestive) heart failure: Secondary | ICD-10-CM | POA: Insufficient documentation

## 2016-08-14 LAB — BASIC METABOLIC PANEL
Anion gap: 9 (ref 5–15)
BUN: 43 mg/dL — ABNORMAL HIGH (ref 6–20)
CHLORIDE: 98 mmol/L — AB (ref 101–111)
CO2: 31 mmol/L (ref 22–32)
CREATININE: 1.13 mg/dL — AB (ref 0.44–1.00)
Calcium: 8.7 mg/dL — ABNORMAL LOW (ref 8.9–10.3)
GFR calc non Af Amer: 46 mL/min — ABNORMAL LOW (ref >60)
GFR, EST AFRICAN AMERICAN: 54 mL/min — AB (ref >60)
Glucose, Bld: 117 mg/dL — ABNORMAL HIGH (ref 65–99)
Potassium: 3.5 mmol/L (ref 3.5–5.1)
Sodium: 138 mmol/L (ref 135–145)

## 2016-08-19 ENCOUNTER — Telehealth (HOSPITAL_COMMUNITY): Payer: Self-pay | Admitting: Pharmacist

## 2016-08-19 NOTE — Telephone Encounter (Signed)
Malvin Johns PA approved by [facility] Part D through 11/15/16.   [name]. Velva Harman, PharmD, BCPS, CPP Clinical Pharmacist Pager: [phone] Phone: [phone] 08/19/2016 10:16 AM

## 2016-09-14 ENCOUNTER — Ambulatory Visit (HOSPITAL_COMMUNITY)
Admission: RE | Admit: 2016-09-14 | Discharge: 2016-09-14 | Disposition: A | Discharge status: Home / Self Care | Payer: Commercial Managed Care - HMO | Source: Ambulatory Visit | Attending: Cardiology | Admitting: Cardiology

## 2016-09-14 VITALS — Wt 154.1 lb

## 2016-09-14 DIAGNOSIS — Z9981 Dependence on supplemental oxygen: Secondary | ICD-10-CM | POA: Diagnosis not present

## 2016-09-14 DIAGNOSIS — I5040 Unspecified combined systolic (congestive) and diastolic (congestive) heart failure: Secondary | ICD-10-CM | POA: Diagnosis not present

## 2016-09-14 DIAGNOSIS — J449 Chronic obstructive pulmonary disease, unspecified: Secondary | ICD-10-CM | POA: Diagnosis not present

## 2016-09-14 DIAGNOSIS — I5032 Chronic diastolic (congestive) heart failure: Secondary | ICD-10-CM | POA: Diagnosis present

## 2016-09-14 DIAGNOSIS — Z7984 Long term (current) use of oral hypoglycemic drugs: Secondary | ICD-10-CM | POA: Diagnosis not present

## 2016-09-14 DIAGNOSIS — Z87891 Personal history of nicotine dependence: Secondary | ICD-10-CM | POA: Diagnosis not present

## 2016-09-14 DIAGNOSIS — E785 Hyperlipidemia, unspecified: Secondary | ICD-10-CM | POA: Insufficient documentation

## 2016-09-14 DIAGNOSIS — Z8673 Personal history of transient ischemic attack (TIA), and cerebral infarction without residual deficits: Secondary | ICD-10-CM | POA: Diagnosis not present

## 2016-09-14 DIAGNOSIS — I272 Pulmonary hypertension, unspecified: Secondary | ICD-10-CM | POA: Diagnosis not present

## 2016-09-14 DIAGNOSIS — N189 Chronic kidney disease, unspecified: Secondary | ICD-10-CM | POA: Insufficient documentation

## 2016-09-14 DIAGNOSIS — E1122 Type 2 diabetes mellitus with diabetic chronic kidney disease: Secondary | ICD-10-CM | POA: Insufficient documentation

## 2016-09-14 DIAGNOSIS — G4733 Obstructive sleep apnea (adult) (pediatric): Secondary | ICD-10-CM | POA: Diagnosis not present

## 2016-09-14 DIAGNOSIS — I4510 Unspecified right bundle-branch block: Secondary | ICD-10-CM | POA: Insufficient documentation

## 2016-09-14 DIAGNOSIS — I471 Supraventricular tachycardia: Secondary | ICD-10-CM | POA: Insufficient documentation

## 2016-09-14 DIAGNOSIS — I130 Hypertensive heart and chronic kidney disease with heart failure and stage 1 through stage 4 chronic kidney disease, or unspecified chronic kidney disease: Secondary | ICD-10-CM | POA: Diagnosis not present

## 2016-09-14 DIAGNOSIS — Z7982 Long term (current) use of aspirin: Secondary | ICD-10-CM | POA: Diagnosis not present

## 2016-09-14 LAB — BASIC METABOLIC PANEL
ANION GAP: 8 (ref 5–15)
BUN: 35 mg/dL — ABNORMAL HIGH (ref 6–20)
CHLORIDE: 103 mmol/L (ref 101–111)
CO2: 31 mmol/L (ref 22–32)
Calcium: 9.7 mg/dL (ref 8.9–10.3)
Creatinine, Ser: 1 mg/dL (ref 0.44–1.00)
GFR calc non Af Amer: 54 mL/min — ABNORMAL LOW (ref >60)
Glucose, Bld: 155 mg/dL — ABNORMAL HIGH (ref 65–99)
POTASSIUM: 3.6 mmol/L (ref 3.5–5.1)
Sodium: 142 mmol/L (ref 135–145)

## 2016-09-14 NOTE — Patient Instructions (Signed)
6 Minute Walk Test today.  Routine lab work today. Will notify you of abnormal results, otherwise no news is good news!  Follow up 2 months with Dr. Aundra Dubin.  Do the following things EVERYDAY: 1) Weigh yourself in the morning before breakfast. Write it down and keep it in a log. 2) Take your medicines as prescribed 3) Eat low salt foods-Limit salt (sodium) to 2000 mg per day.  4) Stay as active as you can everyday 5) Limit all fluids for the day to less than 2 liters

## 2016-09-14 NOTE — Progress Notes (Signed)
Patient ID: Beth [name], female   DOB: 1941-05-02, 75 y.o.   MRN: [id number] PCP: Dr Karlton Lemon Cardiology: Dr. Aundra Dubin  75 yo with history of COPD on home oxygen, untreated OSA, chronic diastolic CHF/RV failure, and pulmonary hypertension presents for cardiology followup.  She was admitted in 5/17 with acute on chronic diastolic CHF with prominent RV failure and marked shortness of breath.  She was diuresed with IV Lasix.  Echo showed dilated and dysfunctional RV with pulmonary hypertension.  RHC showed marked pulmonary hypertension with systemic PA pressure.  She was started on Revatio in the hospital and this was titrated up.  Subsequently, she was started on macitentan.  She next started Selexipag, now on 200 mcg/day.   Weight is stable.  She just started Selexipag, no side effects so far.  No dyspnea walking on flat ground, short of breath with inclinces/stairs.  No chest pain.  No lightheadedness or palpitations.     ECG: NSR, RBBB  6 minute walk (7/17): 238 m  Labs (5/17): K 4.7 => 4, creatinine 0.96 => 1.04 Labs (8/17): K 4.1, creatinine 1.5 Labs (9/17): K 3.5, creatinine 1.13  PMH: 1. H/o CVA 2. DM 3. HTN 4. Hyperlipidemia 5. OSA: Cannot tolerate CPAP, has tried multiple masks.  6. COPD: She is on home oxygen. 7. SVT 8. Chronic diastolic CHF with RV failure: Echo (5/17) with EF 55%, severely dilated RV with moderately decreased systolic function, D-shaped interventricular septum.  9. 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  V/Q scan 5/17 with no evidence for chronic PE.  ANA weakly positive (1:80), ANCA negative, RF negative.  RHC (5/17) with mean RA 20, PA 110/45 mean 66, mean PCWP 20, CI 1.7, PVR 15 WU.  10. CKD  Social History   Social History  . Marital status: Widowed    Spouse name: N/A  . Number of children: 2  . Years of education: N/A   Occupational History  .  retired International aid/development worker [employer]   Social History Main Topics  . Smoking status: Former Smoker    Packs/day: 1.50    Years: 40.00    Types: Cigarettes    Quit date: 11/16/2004  . Smokeless tobacco: Former Systems developer  . Alcohol use No     Comment: occassional  . Drug use: No  . Sexual activity: Not on file   Other Topics Concern  . Not on file   Social History Narrative  . No narrative on file   Family History  Problem Relation Age of Onset  . Heart disease Mother   . Heart disease Brother   . Heart disease Brother   . Heart disease Sister   . Heart disease Sister   . Heart failure Mother   . Heart failure Father    ROS: All systems reviewed and negative except as per HPI.   Current Outpatient Prescriptions  Medication Sig Dispense Refill  . albuterol (ACCUNEB) 0.63 MG/3ML nebulizer solution Take 1 ampule by nebulization every 6 (six) hours as needed for wheezing.     Marland Kitchen albuterol (PROAIR HFA) 108 (90 BASE) MCG/ACT inhaler 2 puffs every 4 hours as needed only  if your can't catch your breath (Patient taking differently: Inhale 1 puff into the lungs every 6 (six) hours as needed. 2 puffs every 4 hours as needed only  if your can't catch your breath) 1 Inhaler 11  . allopurinol (  ZYLOPRIM) 300 MG tablet Take 300 mg by mouth daily.    Marland Kitchen aspirin 81 MG EC tablet Take 1 tablet (81 mg total) by mouth daily. 90 tablet 3  . atorvastatin (LIPITOR) 40 MG tablet Take 40 mg by mouth daily at 6 PM.    . B Complex-C (SUPER B COMPLEX/VITAMIN C) TABS Take 1 tablet by mouth daily.    . budesonide-formoterol (SYMBICORT) 160-4.5 MCG/ACT inhaler INHALE 2 PUFFS FIRST THING IN THE MORNING AND THEN ANOTHER 2 PUFFS ABOUT 12 HOURS LATER 1 Inhaler 11  . Cholecalciferol 1000 units tablet Take 1,000 Units by mouth daily.    . Coenzyme Q10 (COQ10) 200 MG CAPS Take 200 mg by mouth daily.    . Dulaglutide (TRULICITY) 0.21 JD/5.5MC SOPN Inject 0.75 mg into the skin every Monday.    . fexofenadine (ALLEGRA) 180 MG  tablet Take 180 mg by mouth daily.    . furosemide (LASIX) 40 MG tablet Take 1 tablet (40 mg total) by mouth daily. 90 tablet 3  . Glycopyrrolate-Formoterol (BEVESPI AEROSPHERE) 9-4.8 MCG/ACT AERO Inhale 2 puffs into the lungs 2 (two) times daily.    Marland Kitchen loratadine (CLARITIN) 10 MG tablet Take 1 tablet (10 mg total) by mouth daily. 30 tablet 1  . Macitentan (OPSUMIT) 10 MG TABS Take 10 mg by mouth daily. 30 tablet 6  . meloxicam (MOBIC) 15 MG tablet Take 15 mg by mouth daily as needed for pain.    . metFORMIN (GLUCOPHAGE) 500 MG tablet Take 500 mg by mouth 2 (two) times daily with a meal.    . metoprolol succinate (TOPROL-XL) 25 MG 24 hr tablet Take 1 tablet (25 mg total) by mouth daily. 90 tablet 1  . mometasone-formoterol (DULERA) 200-5 MCG/ACT AERO Inhale 2 puffs into the lungs 2 (two) times daily.    . Omega 3 1000 MG CAPS Take 1 capsule by mouth daily.    Marland Kitchen omeprazole (PRILOSEC) 40 MG capsule Take 40 mg by mouth daily.    . Selexipag 200 MCG TABS Take 200 mcg by mouth 2 (two) times daily.    . sildenafil (REVATIO) 20 MG tablet Take 4 tablets (80 mg total) by mouth 3 (three) times daily. 360 tablet 6  . SYMBICORT 160-4.5 MCG/ACT inhaler INHALE 2 PUFFS FIRST THING IN THE MORNING AND THEN ANOTHER 2 PUFFS ABOUT 12 HOURS LATER 1 Inhaler 11  . valsartan-hydrochlorothiazide (DIOVAN-HCT) 160-25 MG tablet Take 1 tablet by mouth daily.     No current facility-administered medications for this encounter.    Wt 154 lb 2 oz (69.9 kg)   BMI 28.19 kg/m  General: NAD Neck: JVP 7 cm, no thyromegaly or thyroid nodule.  Lungs: Distant breath sounds.  CV: Nondisplaced PMI.  Heart regular S1/S2, no S3/S4, no murmur.  1+ edema 1/2 up lower legs bilaterally.  No carotid bruit.  Normal pedal pulses.  Abdomen: Soft, nontender, no hepatosplenomegaly, no distention.  Skin: Erythematous rash primarily on the arms.  Neurologic: Alert and oriented x 3.  Psych: Normal affect. Extremities: No clubbing or cyanosis.   HEENT: Normal.   Assessment/Plan: 1. 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 She was very short of breath with exertion and had very elevated right-sided filling pressures on 5/17 RHC.  PA pressure was systemic. Though cardiac output low, do not think that I would jump towards IV Flolan given the mixed etiology of her PH. V/Q scan not suggestive of  chronic PEs and autoimmune serologies sent (RF negative, ANCA negative, ANA only weakly positive - 1:80). She is feeling much better on combination of Lasix, macitentan, Revatio, and now selexipag.  - Continue Revatio 80 mg tid.  - Continue Opsumit 10 mg daily.  - She is on selexipag 200 mcg bid.  Will titrate up as she tolerates.   - 6 minute walk today.  2. COPD: On home oxygen.  Prior long-time smoker.   3. Chronic diastolic CHF with prominent RV failure: Mild volume overload on exam today.   - Continue Lasix 40 mg daily.   - BMET today.   4. SVT: No palpitations.  She is on Toprol XL.  5. OSA: Has been unable to tolerate CPAP.    Loralie Champagne 09/14/2016

## 2016-10-13 ENCOUNTER — Telehealth (HOSPITAL_COMMUNITY): Payer: Self-pay | Admitting: *Deleted

## 2016-10-13 MED ORDER — SELEXIPAG 200 MCG PO TABS: 400.0000 ug | ORAL_TABLET | Freq: Two times a day (BID) | ORAL | Status: DC

## 2016-10-13 NOTE — Telephone Encounter (Signed)
Pharmacy called to report pt increased Uptravi to 600 mcg BID for a couple of days and experienced severe HA, jaw pain and leg cramps.  They have advised pt to decrease dose back to 400 mcg BID and continue that dose until next appt w/us.  Advised ok for pt to continue 400 mcg BID, will readdress at [place] 12/29

## 2016-11-04 ENCOUNTER — Telehealth (HOSPITAL_COMMUNITY): Payer: Self-pay | Admitting: Pharmacist

## 2016-11-04 NOTE — Telephone Encounter (Signed)
Uptravi 400 mcg BID PA approved by [facility] Part D through 11/15/17.   [name]. Velva Harman, PharmD, BCPS, CPP Clinical Pharmacist Pager: [phone] Phone: [phone] 11/04/2016 11:42 AM

## 2016-11-05 ENCOUNTER — Other Ambulatory Visit (HOSPITAL_COMMUNITY): Payer: Self-pay | Admitting: Cardiology

## 2016-11-05 DIAGNOSIS — I509 Heart failure, unspecified: Secondary | ICD-10-CM

## 2016-11-13 ENCOUNTER — Ambulatory Visit (HOSPITAL_COMMUNITY)
Admission: RE | Admit: 2016-11-13 | Discharge: 2016-11-13 | Disposition: A | Discharge status: Home / Self Care | Payer: Commercial Managed Care - HMO | Source: Ambulatory Visit | Attending: Cardiology | Admitting: Cardiology

## 2016-11-13 VITALS — BP 124/62 | HR 88 | Wt 148.0 lb

## 2016-11-13 DIAGNOSIS — E1122 Type 2 diabetes mellitus with diabetic chronic kidney disease: Secondary | ICD-10-CM | POA: Diagnosis not present

## 2016-11-13 DIAGNOSIS — E785 Hyperlipidemia, unspecified: Secondary | ICD-10-CM | POA: Insufficient documentation

## 2016-11-13 DIAGNOSIS — N189 Chronic kidney disease, unspecified: Secondary | ICD-10-CM | POA: Insufficient documentation

## 2016-11-13 DIAGNOSIS — I50810 Right heart failure, unspecified: Secondary | ICD-10-CM

## 2016-11-13 DIAGNOSIS — I5040 Unspecified combined systolic (congestive) and diastolic (congestive) heart failure: Secondary | ICD-10-CM

## 2016-11-13 DIAGNOSIS — J449 Chronic obstructive pulmonary disease, unspecified: Secondary | ICD-10-CM | POA: Diagnosis not present

## 2016-11-13 DIAGNOSIS — I5032 Chronic diastolic (congestive) heart failure: Secondary | ICD-10-CM | POA: Diagnosis present

## 2016-11-13 DIAGNOSIS — G4733 Obstructive sleep apnea (adult) (pediatric): Secondary | ICD-10-CM | POA: Insufficient documentation

## 2016-11-13 DIAGNOSIS — Z87891 Personal history of nicotine dependence: Secondary | ICD-10-CM | POA: Diagnosis not present

## 2016-11-13 DIAGNOSIS — I509 Heart failure, unspecified: Secondary | ICD-10-CM | POA: Diagnosis not present

## 2016-11-13 DIAGNOSIS — Z7982 Long term (current) use of aspirin: Secondary | ICD-10-CM | POA: Insufficient documentation

## 2016-11-13 DIAGNOSIS — Z8673 Personal history of transient ischemic attack (TIA), and cerebral infarction without residual deficits: Secondary | ICD-10-CM | POA: Diagnosis not present

## 2016-11-13 DIAGNOSIS — Z9981 Dependence on supplemental oxygen: Secondary | ICD-10-CM | POA: Diagnosis not present

## 2016-11-13 DIAGNOSIS — I471 Supraventricular tachycardia: Secondary | ICD-10-CM | POA: Insufficient documentation

## 2016-11-13 DIAGNOSIS — I272 Pulmonary hypertension, unspecified: Secondary | ICD-10-CM

## 2016-11-13 DIAGNOSIS — Z7984 Long term (current) use of oral hypoglycemic drugs: Secondary | ICD-10-CM | POA: Insufficient documentation

## 2016-11-13 DIAGNOSIS — I130 Hypertensive heart and chronic kidney disease with heart failure and stage 1 through stage 4 chronic kidney disease, or unspecified chronic kidney disease: Secondary | ICD-10-CM | POA: Insufficient documentation

## 2016-11-13 DIAGNOSIS — I10 Essential (primary) hypertension: Secondary | ICD-10-CM

## 2016-11-13 LAB — BASIC METABOLIC PANEL
ANION GAP: 11 (ref 5–15)
BUN: 49 mg/dL — AB (ref 6–20)
CALCIUM: 9.7 mg/dL (ref 8.9–10.3)
CO2: 30 mmol/L (ref 22–32)
Chloride: 100 mmol/L — ABNORMAL LOW (ref 101–111)
Creatinine, Ser: 1.13 mg/dL — ABNORMAL HIGH (ref 0.44–1.00)
GFR calc Af Amer: 54 mL/min — ABNORMAL LOW (ref >60)
GFR, EST NON AFRICAN AMERICAN: 46 mL/min — AB (ref >60)
GLUCOSE: 135 mg/dL — AB (ref 65–99)
POTASSIUM: 3.8 mmol/L (ref 3.5–5.1)
SODIUM: 141 mmol/L (ref 135–145)

## 2016-11-13 LAB — BRAIN NATRIURETIC PEPTIDE: B NATRIURETIC PEPTIDE 5: 28.9 pg/mL (ref 0.0–100.0)

## 2016-11-13 MED ORDER — FUROSEMIDE 40 MG PO TABS: 20.0000 mg | ORAL_TABLET | ORAL | 3 refills | Status: DC

## 2016-11-13 MED ORDER — POTASSIUM CHLORIDE ER 20 MEQ PO TBCR: 20.0000 meq | EXTENDED_RELEASE_TABLET | Freq: Every day | ORAL | 6 refills | Status: DC

## 2016-11-13 NOTE — Progress Notes (Signed)
6 Minute Walk test completed during CHF OV today. Patient tolerated well, slow but steady pace without rest breaks. Starting sats on 2L continuous O2 94%. End of walk sats 82% Increased O2 to 3L with sats at 90% Advised per Dr. Aundra Dubin to increase O2 to 3 L continuous as needed with exertion. HR maintained 80-90s. Mildly SOB towards end of walk, but was able to carry on conversations without difficulty during walk without rest breaks. Total ambulated distance 560 ft (170.688 meters) Dr. Aundra Dubin made aware.  Renee Pain, RN

## 2016-11-13 NOTE — Progress Notes (Addendum)
Patient ID: Beth [name], female   DOB: August 31, 1941, 74 y.o.   MRN: [id number] PCP: Dr Karlton Lemon Cardiology: Dr. Aundra Dubin  75 yo with history of COPD on home oxygen, untreated OSA, chronic diastolic CHF/RV failure, and pulmonary hypertension presents for cardiology followup.  She was admitted in 5/17 with acute on chronic diastolic CHF with prominent RV failure and marked shortness of breath.  She was diuresed with IV Lasix.  Echo showed dilated and dysfunctional RV with pulmonary hypertension.  RHC showed marked pulmonary hypertension with systemic PA pressure.  She was started on Revatio in the hospital and this was titrated up.  Subsequently, she was started on macitentan.  She next started Selexipag at 200 mcg/day.  She had intractable side effects with selexipag so stopped it.    She presents today for regular follow up.  Weight down 6 lbs.  No DOE on flat ground though she did not go as far on her 6 minute walk today as in the past. Does have SOB with steps or if she gets into a hurry.  No lightheadedness or dizziness. No palpitations. No chest pain. Taking lasix 40 mg daily. Hasn't needed any extra. No orthopnea. No bendopnea.  6 minute walk (7/17): 238 m 6 minute walk (10/17): 171 m  Labs (5/17): K 4.7 => 4, creatinine 0.96 => 1.04 Labs (8/17): K 4.1, creatinine 1.5 Labs (9/17): K 3.5, creatinine 1.13 Labs (10/17): K 3.6, creatinine 1.0  PMH: 1. H/o CVA 2. DM 3. HTN 4. Hyperlipidemia 5. OSA: Cannot tolerate CPAP, has tried multiple masks.  6. COPD: She is on home oxygen. 7. SVT 8. Chronic diastolic CHF with RV failure: Echo (5/17) with EF 55%, severely dilated RV with moderately decreased systolic function, D-shaped interventricular septum.  9. 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  V/Q scan 5/17 with no evidence for chronic PE.  ANA weakly positive (1:80), ANCA negative, RF negative.   RHC (5/17) with mean RA 20, PA 110/45 mean 66, mean PCWP 20, CI 1.7, PVR 15 WU.  - Intractable side effects with selexipag.  10. CKD  Social History   Social History  . Marital status: Widowed    Spouse name: N/A  . Number of children: 2  . Years of education: N/A   Occupational History  . retired International aid/development worker [employer]   Social History Main Topics  . Smoking status: Former Smoker    Packs/day: 1.50    Years: 40.00    Types: Cigarettes    Quit date: 11/16/2004  . Smokeless tobacco: Former Systems developer  . Alcohol use No     Comment: occassional  . Drug use: No  . Sexual activity: Not on file   Other Topics Concern  . Not on file   Social History Narrative  . No narrative on file   Family History  Problem Relation Age of Onset  . Heart disease Mother   . Heart disease Brother   . Heart disease Brother   . Heart disease Sister   . Heart disease Sister   . Heart failure Mother   . Heart failure Father    ROS: All systems reviewed and negative except as per HPI.   Current Outpatient Prescriptions  Medication Sig Dispense Refill  . albuterol (ACCUNEB) 0.63 MG/3ML nebulizer solution Take 1 ampule by nebulization every 6 (six) hours as needed for wheezing.     Marland Kitchen  albuterol (PROAIR HFA) 108 (90 BASE) MCG/ACT inhaler 2 puffs every 4 hours as needed only  if your can't catch your breath (Patient taking differently: Inhale 1 puff into the lungs every 6 (six) hours as needed. 2 puffs every 4 hours as needed only  if your can't catch your breath) 1 Inhaler 11  . allopurinol (ZYLOPRIM) 300 MG tablet Take 300 mg by mouth daily.    Marland Kitchen aspirin 81 MG EC tablet Take 1 tablet (81 mg total) by mouth daily. 90 tablet 3  . atorvastatin (LIPITOR) 40 MG tablet Take 40 mg by mouth daily at 6 PM.    . B Complex-C (SUPER B COMPLEX/VITAMIN C) TABS Take 1 tablet by mouth daily.    . budesonide-formoterol (SYMBICORT) 160-4.5 MCG/ACT inhaler INHALE 2 PUFFS FIRST THING IN THE MORNING AND THEN ANOTHER 2  PUFFS ABOUT 12 HOURS LATER 1 Inhaler 11  . Cholecalciferol 1000 units tablet Take 1,000 Units by mouth daily.    . Coenzyme Q10 (COQ10) 200 MG CAPS Take 200 mg by mouth daily.    . Dulaglutide (TRULICITY) 9.51 OA/4.1YS SOPN Inject 0.75 mg into the skin every Monday.    . fexofenadine (ALLEGRA) 180 MG tablet Take 180 mg by mouth daily.    . furosemide (LASIX) 40 MG tablet Take 1 tablet (40 mg total) by mouth daily. 90 tablet 3  . Glycopyrrolate-Formoterol (BEVESPI AEROSPHERE) 9-4.8 MCG/ACT AERO Inhale 2 puffs into the lungs 2 (two) times daily.    Marland Kitchen loratadine (CLARITIN) 10 MG tablet Take 1 tablet (10 mg total) by mouth daily. 30 tablet 1  . Macitentan (OPSUMIT) 10 MG TABS Take 10 mg by mouth daily. 30 tablet 6  . meloxicam (MOBIC) 15 MG tablet Take 15 mg by mouth daily as needed for pain.    . metFORMIN (GLUCOPHAGE) 500 MG tablet Take 500 mg by mouth 2 (two) times daily with a meal.    . metoprolol succinate (TOPROL-XL) 25 MG 24 hr tablet TAKE 1 TABLET BY MOUTH EVERY DAY 90 tablet 1  . mometasone-formoterol (DULERA) 200-5 MCG/ACT AERO Inhale 2 puffs into the lungs 2 (two) times daily.    . Omega 3 1000 MG CAPS Take 1 capsule by mouth daily.    Marland Kitchen omeprazole (PRILOSEC) 40 MG capsule Take 40 mg by mouth daily.    . sildenafil (REVATIO) 20 MG tablet Take 4 tablets (80 mg total) by mouth 3 (three) times daily. 360 tablet 6  . SYMBICORT 160-4.5 MCG/ACT inhaler INHALE 2 PUFFS FIRST THING IN THE MORNING AND THEN ANOTHER 2 PUFFS ABOUT 12 HOURS LATER 1 Inhaler 11  . valsartan-hydrochlorothiazide (DIOVAN-HCT) 160-25 MG tablet Take 1 tablet by mouth daily.     No current facility-administered medications for this encounter.    BP 124/62 (BP Location: Left Arm, Patient Position: Sitting, Cuff Size: Normal)   Pulse 88   Wt 148 lb (67.1 kg)   SpO2 92% Comment: 2L O2 continuous via [place]  BMI 27.07 kg/m    Wt Readings from Last 3 Encounters:  11/13/16 148 lb (67.1 kg)  09/14/16 154 lb 2 oz (69.9 kg)   07/31/16 154 lb (69.9 kg)   General: Elderly. NAD Neck: JVP 8-9 cm. No thyromegaly or thyroid nodule.  Lungs: Breath sounds distant, but clear.   CV: Nondisplaced PMI.  Heart regular S1/S2, no S3/S4, no murmur.  1+ ankle edema.   No carotid bruit.  Normal pedal pulses.  Abdomen: Soft, NT, mildly distended, no HSM. No bruits or masses. +BS  Neurologic: Alert and oriented x 3.  Psych: Normal affect. Extremities: No clubbing or cyanosis.  HEENT: Normal.   Assessment/Plan: 1. 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 She was very short of breath with exertion and had very elevated right-sided filling pressures on 5/17 RHC.  PA pressure was systemic. Cardiac output low, but not moving towards IV Flolan given the mixed etiology of her PH. V/Q scan not suggestive of chronic PEs and autoimmune serologies sent (RF negative, ANCA negative, ANA only weakly positive - 1:80). Overall, feels well on current meds though unable to tolerate selexipag and off it. Her 6 minute walk is less today despite reported minimal symptoms.    - Continue Revatio 80 mg tid.  - Continue Opsumit 10 mg daily.  - Intolerant to selexipag.  I will set her up for Tyvaso.  This may be a good choice given her parenchymal lung disease as it can theoretically limit worsening of V/Q mismatch from pulmonary vasodilation.   2. COPD: On home oxygen.  Prior long-time smoker.   3. Chronic diastolic CHF with prominent RV failure: Volume status mildly elevated on exam - Increase lasix to 40 mg q am and 20 mg q pm.      - BMET/BNP today and again in 10 days.  - Reinforced fluid restriction to < 2 L daily, sodium restriction to less than 2000 mg daily, and the importance of daily weights.  4. SVT: No palpitations.  She is on Toprol XL.  5. OSA: Has been unable to tolerate CPAP.  She uses oxygen at night.  OSA may have small contribution to  [facility] but does not explain the extent of PAH.   Followup in 6 wks.   Loralie Champagne 11/14/2016

## 2016-11-13 NOTE — Patient Instructions (Addendum)
INCREASE Lasix to 40 mg(1 tab) in the AM and 20 mg(1/2 tab) in the PM START Potassium 20 meQ, one tab daily  Labs today We will only contact you if something comes back abnormal or we need to make some changes. Otherwise no news is good news!  Labs needed again in 10- 14 days  You have been referred to begin Tyvaso, we will send this to a specialty pharmacy, begin the application process, and be in touch with you regarding a start date.  Your physician recommends that you schedule a follow-up appointment in: 6 weeks with Dr Aundra Dubin

## 2016-11-18 ENCOUNTER — Encounter (HOSPITAL_COMMUNITY): Payer: Self-pay

## 2016-11-18 NOTE — Progress Notes (Signed)
Tyvaso order form faxed to [facility] with supporting/requested documentation per Dr. Aundra Dubin.  Renee Pain, RN

## 2016-11-24 ENCOUNTER — Inpatient Hospital Stay (HOSPITAL_COMMUNITY): Admission: RE | Admit: 2016-11-24 | Payer: Commercial Managed Care - HMO | Source: Ambulatory Visit

## 2016-11-24 ENCOUNTER — Ambulatory Visit (HOSPITAL_COMMUNITY)
Admission: RE | Admit: 2016-11-24 | Discharge: 2016-11-24 | Disposition: A | Discharge status: Home / Self Care | Payer: Commercial Managed Care - HMO | Source: Ambulatory Visit | Attending: Cardiology | Admitting: Cardiology

## 2016-11-24 DIAGNOSIS — I5040 Unspecified combined systolic (congestive) and diastolic (congestive) heart failure: Secondary | ICD-10-CM | POA: Insufficient documentation

## 2016-11-24 LAB — BASIC METABOLIC PANEL
ANION GAP: 8 (ref 5–15)
BUN: 44 mg/dL — ABNORMAL HIGH (ref 6–20)
CHLORIDE: 104 mmol/L (ref 101–111)
CO2: 28 mmol/L (ref 22–32)
CREATININE: 1.14 mg/dL — AB (ref 0.44–1.00)
Calcium: 9.5 mg/dL (ref 8.9–10.3)
GFR calc non Af Amer: 46 mL/min — ABNORMAL LOW (ref >60)
GFR, EST AFRICAN AMERICAN: 53 mL/min — AB (ref >60)
Glucose, Bld: 114 mg/dL — ABNORMAL HIGH (ref 65–99)
Potassium: 3.9 mmol/L (ref 3.5–5.1)
SODIUM: 140 mmol/L (ref 135–145)

## 2016-11-24 LAB — BRAIN NATRIURETIC PEPTIDE: B Natriuretic Peptide: 33.9 pg/mL (ref 0.0–100.0)

## 2016-11-25 NOTE — Addendum Note (Signed)
Encounter addended by: Larey Dresser, MD on: 11/25/2016  3:48 PM<BR>    Actions taken: Sign clinical note

## 2016-12-02 ENCOUNTER — Other Ambulatory Visit (HOSPITAL_COMMUNITY): Payer: Self-pay | Admitting: Cardiology

## 2016-12-02 DIAGNOSIS — J810 Acute pulmonary edema: Secondary | ICD-10-CM

## 2016-12-08 ENCOUNTER — Other Ambulatory Visit (HOSPITAL_COMMUNITY): Payer: Self-pay | Admitting: Pharmacist

## 2016-12-08 DIAGNOSIS — J810 Acute pulmonary edema: Secondary | ICD-10-CM

## 2016-12-08 MED ORDER — MACITENTAN 10 MG PO TABS: 10.0000 mg | ORAL_TABLET | Freq: Every day | ORAL | 11 refills | Status: DC

## 2016-12-11 ENCOUNTER — Telehealth (HOSPITAL_COMMUNITY): Payer: Self-pay | Admitting: Pharmacist

## 2016-12-11 NOTE — Telephone Encounter (Signed)
Sildenafil tier exception denied, now at tier 4. [employer] foundation out of funding for [facility] medications, but will continue to check for funding and enroll her if able.   [name]. Velva Harman, PharmD, BCPS, CPP Clinical Pharmacist Pager: [phone] Phone: [phone] 12/11/2016 10:27 AM

## 2016-12-14 ENCOUNTER — Other Ambulatory Visit (HOSPITAL_COMMUNITY): Payer: Self-pay | Admitting: *Deleted

## 2016-12-14 ENCOUNTER — Telehealth (HOSPITAL_COMMUNITY): Payer: Self-pay | Admitting: Pharmacist

## 2016-12-14 DIAGNOSIS — J810 Acute pulmonary edema: Secondary | ICD-10-CM

## 2016-12-14 MED ORDER — MACITENTAN 10 MG PO TABS: 10.0000 mg | ORAL_TABLET | Freq: Every day | ORAL | 11 refills | Status: DC

## 2016-12-14 NOTE — Telephone Encounter (Addendum)
Tyvaso inhalation starter kit PA approved by [employer] Part D through 12/09/18.   ADDENDUM:  2/20 - Tyvaso inhalation refill kit approved by Medicare Part B (not covered by part D).   [name]. Velva Harman, PharmD, BCPS, CPP Clinical Pharmacist Pager: [phone] Phone: [phone] 12/14/2016 11:15 AM

## 2016-12-24 ENCOUNTER — Other Ambulatory Visit (HOSPITAL_COMMUNITY): Payer: Self-pay | Admitting: Pharmacist

## 2016-12-24 DIAGNOSIS — I2721 Secondary pulmonary arterial hypertension: Secondary | ICD-10-CM

## 2016-12-24 MED ORDER — TREPROSTINIL 0.6 MG/ML IN SOLN: 18.0000 ug | Freq: Four times a day (QID) | RESPIRATORY_TRACT | 11 refills | Status: DC

## 2016-12-25 ENCOUNTER — Encounter (HOSPITAL_COMMUNITY): Payer: Commercial Managed Care - HMO

## 2017-01-06 ENCOUNTER — Ambulatory Visit (HOSPITAL_COMMUNITY)
Admission: RE | Admit: 2017-01-06 | Discharge: 2017-01-06 | Disposition: A | Discharge status: Home / Self Care | Payer: Commercial Managed Care - HMO | Source: Ambulatory Visit | Attending: Cardiology | Admitting: Cardiology

## 2017-01-06 ENCOUNTER — Encounter (HOSPITAL_COMMUNITY): Payer: Self-pay

## 2017-01-06 VITALS — BP 100/80 | HR 82 | Wt 142.2 lb

## 2017-01-06 DIAGNOSIS — I700 Atherosclerosis of aorta: Secondary | ICD-10-CM | POA: Insufficient documentation

## 2017-01-06 DIAGNOSIS — I5032 Chronic diastolic (congestive) heart failure: Secondary | ICD-10-CM | POA: Diagnosis not present

## 2017-01-06 DIAGNOSIS — I509 Heart failure, unspecified: Secondary | ICD-10-CM | POA: Diagnosis not present

## 2017-01-06 DIAGNOSIS — Z7984 Long term (current) use of oral hypoglycemic drugs: Secondary | ICD-10-CM | POA: Insufficient documentation

## 2017-01-06 DIAGNOSIS — I517 Cardiomegaly: Secondary | ICD-10-CM | POA: Diagnosis not present

## 2017-01-06 DIAGNOSIS — R918 Other nonspecific abnormal finding of lung field: Secondary | ICD-10-CM | POA: Diagnosis not present

## 2017-01-06 DIAGNOSIS — G4733 Obstructive sleep apnea (adult) (pediatric): Secondary | ICD-10-CM | POA: Insufficient documentation

## 2017-01-06 DIAGNOSIS — I272 Pulmonary hypertension, unspecified: Secondary | ICD-10-CM | POA: Diagnosis not present

## 2017-01-06 DIAGNOSIS — R05 Cough: Secondary | ICD-10-CM | POA: Diagnosis present

## 2017-01-06 DIAGNOSIS — N189 Chronic kidney disease, unspecified: Secondary | ICD-10-CM | POA: Diagnosis not present

## 2017-01-06 DIAGNOSIS — Z9981 Dependence on supplemental oxygen: Secondary | ICD-10-CM | POA: Insufficient documentation

## 2017-01-06 DIAGNOSIS — I130 Hypertensive heart and chronic kidney disease with heart failure and stage 1 through stage 4 chronic kidney disease, or unspecified chronic kidney disease: Secondary | ICD-10-CM | POA: Diagnosis not present

## 2017-01-06 DIAGNOSIS — J441 Chronic obstructive pulmonary disease with (acute) exacerbation: Secondary | ICD-10-CM

## 2017-01-06 DIAGNOSIS — E785 Hyperlipidemia, unspecified: Secondary | ICD-10-CM | POA: Diagnosis not present

## 2017-01-06 DIAGNOSIS — Z8673 Personal history of transient ischemic attack (TIA), and cerebral infarction without residual deficits: Secondary | ICD-10-CM | POA: Diagnosis not present

## 2017-01-06 DIAGNOSIS — I471 Supraventricular tachycardia: Secondary | ICD-10-CM | POA: Insufficient documentation

## 2017-01-06 DIAGNOSIS — Z87891 Personal history of nicotine dependence: Secondary | ICD-10-CM | POA: Diagnosis not present

## 2017-01-06 DIAGNOSIS — E1122 Type 2 diabetes mellitus with diabetic chronic kidney disease: Secondary | ICD-10-CM | POA: Diagnosis not present

## 2017-01-06 DIAGNOSIS — Z7982 Long term (current) use of aspirin: Secondary | ICD-10-CM | POA: Diagnosis not present

## 2017-01-06 DIAGNOSIS — I50810 Right heart failure, unspecified: Secondary | ICD-10-CM

## 2017-01-06 DIAGNOSIS — J449 Chronic obstructive pulmonary disease, unspecified: Secondary | ICD-10-CM | POA: Diagnosis present

## 2017-01-06 LAB — BASIC METABOLIC PANEL
Anion gap: 9 (ref 5–15)
BUN: 32 mg/dL — AB (ref 6–20)
CHLORIDE: 101 mmol/L (ref 101–111)
CO2: 30 mmol/L (ref 22–32)
CREATININE: 1.07 mg/dL — AB (ref 0.44–1.00)
Calcium: 9.7 mg/dL (ref 8.9–10.3)
GFR calc Af Amer: 57 mL/min — ABNORMAL LOW (ref >60)
GFR calc non Af Amer: 49 mL/min — ABNORMAL LOW (ref >60)
GLUCOSE: 114 mg/dL — AB (ref 65–99)
Potassium: 4.1 mmol/L (ref 3.5–5.1)
SODIUM: 140 mmol/L (ref 135–145)

## 2017-01-06 LAB — BRAIN NATRIURETIC PEPTIDE: B Natriuretic Peptide: 24.9 pg/mL (ref 0.0–100.0)

## 2017-01-06 MED ORDER — PREDNISONE 20 MG PO TABS: 20.0000 mg | ORAL_TABLET | Freq: Every day | ORAL | 0 refills | Status: AC

## 2017-01-06 MED ORDER — PREDNISONE 20 MG PO TABS: 20.0000 mg | ORAL_TABLET | Freq: Every day | ORAL | 0 refills | Status: DC

## 2017-01-06 MED ORDER — DOXYCYCLINE HYCLATE 100 MG PO CAPS: 100.0000 mg | ORAL_CAPSULE | Freq: Two times a day (BID) | ORAL | 0 refills | Status: DC

## 2017-01-06 MED ORDER — DOXYCYCLINE HYCLATE 100 MG PO CAPS: 100.0000 mg | ORAL_CAPSULE | Freq: Two times a day (BID) | ORAL | 0 refills | Status: AC

## 2017-01-06 NOTE — Patient Instructions (Signed)
Start Prednisone 20 mg daily FOR 5 DAYS  Start Doxycycline 100 mg Twice daily FOR 7 DAYS, TAKE WITH FOOD  Chest x-ray today  Your physician recommends that you schedule a follow-up appointment in: 6 weeks

## 2017-01-07 NOTE — Progress Notes (Signed)
Patient ID: Beth [name], female   DOB: 1941/01/11, 76 y.o.   MRN: [id number] PCP: Dr Karlton Lemon Cardiology: Dr. Aundra Dubin  76 yo with history of COPD on home oxygen, untreated OSA, chronic diastolic CHF/RV failure, and pulmonary hypertension presents for cardiology followup.  She was admitted in 5/17 with acute on chronic diastolic CHF with prominent RV failure and marked shortness of breath.  She was diuresed with IV Lasix.  Echo showed dilated and dysfunctional RV with pulmonary hypertension.  RHC showed marked pulmonary hypertension with systemic PA pressure.  She was started on Revatio in the hospital and this was titrated up.  Subsequently, she was started on macitentan.  She next started Selexipag at 200 mcg/day.  She had intractable side effects with selexipag so stopped it.    She presents today for regular follow up.  Weight down 6 lbs.  She has been more short of breath over the last few days and has been wheezing.  She says that it feels like her typical COPD exacerbation.  No cough or sputum production.  No fever.  OK walking around house but gets short of breath walking longer distances.  No lightheadedness or dizziness. No palpitations. No chest pain. Taking lasix 40 mg daily. Hasn't needed any extra. No orthopnea. No bendopnea.  6 minute walk (7/17): 238 m 6 minute walk (10/17): 171 m  Labs (5/17): K 4.7 => 4, creatinine 0.96 => 1.04 Labs (8/17): K 4.1, creatinine 1.5 Labs (9/17): K 3.5, creatinine 1.13 Labs (10/17): K 3.6, creatinine 1.0 Labs (1/18): K 3.9, creatinine 1.14, BNP 34  PMH: 1. H/o CVA 2. DM 3. HTN 4. Hyperlipidemia 5. OSA: Cannot tolerate CPAP, has tried multiple masks.  6. COPD: She is on home oxygen. 7. SVT 8. Chronic diastolic CHF with RV failure: Echo (5/17) with EF 55%, severely dilated RV with moderately decreased systolic function, D-shaped interventricular septum.  9. Pulmonary hypertension: Suspect mixed group 3 PH (COPD, untreated OSA) and group 1 (elevated  PA pressure out of proportion to lung disease).  V/Q scan 5/17 with no evidence for chronic PE.  ANA weakly positive (1:80), ANCA negative, RF negative.  RHC (5/17) with mean RA 20, PA 110/45 mean 66, mean PCWP 20, CI 1.7, PVR 15 WU.  - Intractable side effects with selexipag.  10. CKD  Social History   Social History  . Marital status: Widowed    Spouse name: N/A  . Number of children: 2  . Years of education: N/A   Occupational History  . retired International aid/development worker [employer]   Social History Main Topics  . Smoking status: Former Smoker    Packs/day: 1.50    Years: 40.00    Types: Cigarettes    Quit date: 11/16/2004  . Smokeless tobacco: Former Systems developer  . Alcohol use No     Comment: occassional  . Drug use: No  . Sexual activity: Not on file   Other Topics Concern  . Not on file   Social History Narrative  . No narrative on file   Family History  Problem Relation Age of Onset  . Heart disease Mother   . Heart disease Brother   . Heart disease Brother   . Heart disease Sister   . Heart disease Sister   . Heart failure Mother   . Heart failure Father    ROS: All systems reviewed and negative except as per HPI.   Current Outpatient Prescriptions  Medication Sig Dispense Refill  . albuterol (  ACCUNEB) 0.63 MG/3ML nebulizer solution Take 1 ampule by nebulization every 6 (six) hours as needed for wheezing.     Marland Kitchen albuterol (PROAIR HFA) 108 (90 BASE) MCG/ACT inhaler 2 puffs every 4 hours as needed only  if your can't catch your breath (Patient taking differently: Inhale 1 puff into the lungs every 6 (six) hours as needed. 2 puffs every 4 hours as needed only  if your can't catch your breath) 1 Inhaler 11  . allopurinol (ZYLOPRIM) 300 MG tablet Take 300 mg by mouth daily.    Marland Kitchen aspirin 81 MG EC tablet Take 1 tablet (81 mg total) by mouth daily. 90 tablet 3  . atorvastatin (LIPITOR) 40 MG tablet Take 40 mg by mouth daily at 6 PM.    . B Complex-C (SUPER B COMPLEX/VITAMIN C) TABS  Take 1 tablet by mouth daily.    . budesonide-formoterol (SYMBICORT) 160-4.5 MCG/ACT inhaler INHALE 2 PUFFS FIRST THING IN THE MORNING AND THEN ANOTHER 2 PUFFS ABOUT 12 HOURS LATER 1 Inhaler 11  . Cholecalciferol 1000 units tablet Take 1,000 Units by mouth daily.    . Coenzyme Q10 (COQ10) 200 MG CAPS Take 200 mg by mouth daily.    . Dulaglutide (TRULICITY) 6.37 CH/8.8FO SOPN Inject 0.75 mg into the skin every Monday.    . fexofenadine (ALLEGRA) 180 MG tablet Take 180 mg by mouth daily.    . furosemide (LASIX) 40 MG tablet Take 0.5-1 tablets (20-40 mg total) by mouth as directed. Take 40 mg in the AM and 20 mg in the PM 90 tablet 3  . Glycopyrrolate-Formoterol (BEVESPI AEROSPHERE) 9-4.8 MCG/ACT AERO Inhale 2 puffs into the lungs 2 (two) times daily.    Marland Kitchen loratadine (CLARITIN) 10 MG tablet Take 1 tablet (10 mg total) by mouth daily. 30 tablet 1  . Macitentan (OPSUMIT) 10 MG TABS Take 1 tablet (10 mg total) by mouth daily. 30 tablet 11  . meloxicam (MOBIC) 15 MG tablet Take 15 mg by mouth daily as needed for pain.    . metFORMIN (GLUCOPHAGE) 500 MG tablet Take 500 mg by mouth 2 (two) times daily with a meal.    . metoprolol succinate (TOPROL-XL) 25 MG 24 hr tablet TAKE 1 TABLET BY MOUTH EVERY DAY 90 tablet 1  . mometasone-formoterol (DULERA) 200-5 MCG/ACT AERO Inhale 2 puffs into the lungs 2 (two) times daily.    . Omega 3 1000 MG CAPS Take 1 capsule by mouth daily.    Marland Kitchen omeprazole (PRILOSEC) 40 MG capsule Take 40 mg by mouth daily.    . Potassium Chloride ER 20 MEQ TBCR Take 20 mEq by mouth daily. 30 tablet 6  . sildenafil (REVATIO) 20 MG tablet TAKE 4 TABLETS (80 MG TOTAL) BY MOUTH 3 (THREE) TIMES DAILY. 360 tablet 6  . SYMBICORT 160-4.5 MCG/ACT inhaler INHALE 2 PUFFS FIRST THING IN THE MORNING AND THEN ANOTHER 2 PUFFS ABOUT 12 HOURS LATER 1 Inhaler 11  . valsartan-hydrochlorothiazide (DIOVAN-HCT) 160-25 MG tablet Take 1 tablet by mouth daily.    Marland Kitchen doxycycline (VIBRAMYCIN) 100 MG capsule Take 1  capsule (100 mg total) by mouth 2 (two) times daily. Take with food 14 capsule 0  . predniSONE (DELTASONE) 20 MG tablet Take 1 tablet (20 mg total) by mouth daily with breakfast. 5 tablet 0  . Treprostinil (TYVASO REFILL) 0.6 MG/ML SOLN Inhale 18 mcg into the lungs 4 (four) times daily. (Patient not taking: Reported on 01/06/2017) 3.6 mL 11   No current facility-administered medications for this encounter.  BP 100/80   Pulse 82   Wt 142 lb 4 oz (64.5 kg)   SpO2 92%   BMI 26.02 kg/m    Wt Readings from Last 3 Encounters:  01/06/17 142 lb 4 oz (64.5 kg)  11/13/16 148 lb (67.1 kg)  09/14/16 154 lb 2 oz (69.9 kg)   General: Elderly. NAD Neck: JVP 7 cm. No thyromegaly or thyroid nodule.  Lungs: Breath sounds distant with end expiratory wheezes.    CV: Nondisplaced PMI.  Heart regular S1/S2, no S3/S4, no murmur.  Trace ankle edema.   No carotid bruit.  Normal pedal pulses.  Abdomen: Soft, NT, mildly distended, no HSM. No bruits or masses. +BS  Neurologic: Alert and oriented x 3.  Psych: Normal affect. Extremities: No clubbing or cyanosis.  HEENT: Normal.   Assessment/Plan: 1. 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 She was very short of breath with exertion and had very elevated right-sided filling pressures on 5/17 RHC.  PA pressure was systemic. Cardiac output low, but not moving towards IV Flolan given the mixed etiology of her PH. V/Q scan not suggestive of chronic PEs and autoimmune serologies sent (RF negative, ANCA negative, ANA only weakly positive - 1:80).  - Suspect COPD exacerbation, hold off on 6 minute walk.  - Continue Revatio 80 mg tid.  - Continue Opsumit 10 mg daily.  - Intolerant to selexipag.  She has been approved for Tyvaso but is waiting to get it.  This may be a good choice given her parenchymal lung disease as it can theoretically limit worsening of V/Q  mismatch from pulmonary vasodilation.   2. COPD: On home oxygen.  Prior long-time smoker.  Wheezing on exam, increased dyspnea.  Suspect COPD exacerbation as she does not appear volume overloaded.  - Continue home inhalers.  - I will give her a course of prednisone for 5 days.  - Doxycycline 100 mg bid x 1 week.  - CXR to rule out PNA.  3. Chronic diastolic CHF with prominent RV failure: She does not appear volume overloaded and weight is down.  Suspect increased dyspnea is due to COPD exacerbation.  - Continue Lasix 40 mg q am and 20 mg q pm.      - BMET/BNP today.  - Reinforced fluid restriction to < 2 L daily, sodium restriction to less than 2000 mg daily, and the importance of daily weights.  4. SVT: No palpitations.  She is on Toprol XL.  5. OSA: Has been unable to tolerate CPAP.  She uses oxygen at night.  OSA may have small contribution to [facility] but does not explain the extent of PAH.   Followup in 6 wks.   Loralie Champagne 01/07/2017

## 2017-01-18 ENCOUNTER — Other Ambulatory Visit (HOSPITAL_COMMUNITY): Payer: Self-pay | Admitting: Cardiology

## 2017-01-18 MED ORDER — POTASSIUM CHLORIDE ER 20 MEQ PO TBCR: 20.0000 meq | EXTENDED_RELEASE_TABLET | Freq: Every day | ORAL | 3 refills | Status: DC

## 2017-01-29 ENCOUNTER — Telehealth (HOSPITAL_COMMUNITY): Payer: Self-pay | Admitting: Pharmacist

## 2017-01-29 NOTE — Telephone Encounter (Signed)
P[employer] foundation re-opened for [facility] so was able to enroll Beth [name] so that she will have $5300 toward her sildenafil copay costs through 01/28/18. [facility] [address] and verified [facility] copay. Patient aware and grateful for the assistance.   Member ID: [id number] Group ID: [id number] RxBin ID: [id number] PCN: [id number] Eligibility Start Date: 10/31/2016 Eligibility End Date: 01/28/2018 Assistance Amount: $5,300.00    Doroteo Bradford K. Velva Harman, PharmD, BCPS, CPP Clinical Pharmacist Pager: [phone] Phone: [phone] 01/29/2017 3:04 PM

## 2017-02-10 ENCOUNTER — Ambulatory Visit (HOSPITAL_COMMUNITY)
Admission: RE | Admit: 2017-02-10 | Discharge: 2017-02-10 | Disposition: A | Discharge status: Home / Self Care | Payer: Medicare HMO | Source: Ambulatory Visit | Attending: Cardiology | Admitting: Cardiology

## 2017-02-10 ENCOUNTER — Encounter (HOSPITAL_COMMUNITY): Payer: Self-pay

## 2017-02-10 VITALS — BP 170/70 | HR 90 | Wt 141.8 lb

## 2017-02-10 DIAGNOSIS — I471 Supraventricular tachycardia: Secondary | ICD-10-CM | POA: Diagnosis not present

## 2017-02-10 DIAGNOSIS — I50810 Right heart failure, unspecified: Secondary | ICD-10-CM

## 2017-02-10 DIAGNOSIS — I130 Hypertensive heart and chronic kidney disease with heart failure and stage 1 through stage 4 chronic kidney disease, or unspecified chronic kidney disease: Secondary | ICD-10-CM | POA: Diagnosis present

## 2017-02-10 DIAGNOSIS — G4733 Obstructive sleep apnea (adult) (pediatric): Secondary | ICD-10-CM | POA: Insufficient documentation

## 2017-02-10 DIAGNOSIS — Z7984 Long term (current) use of oral hypoglycemic drugs: Secondary | ICD-10-CM | POA: Diagnosis not present

## 2017-02-10 DIAGNOSIS — Z8673 Personal history of transient ischemic attack (TIA), and cerebral infarction without residual deficits: Secondary | ICD-10-CM | POA: Insufficient documentation

## 2017-02-10 DIAGNOSIS — I5032 Chronic diastolic (congestive) heart failure: Secondary | ICD-10-CM | POA: Insufficient documentation

## 2017-02-10 DIAGNOSIS — E1122 Type 2 diabetes mellitus with diabetic chronic kidney disease: Secondary | ICD-10-CM | POA: Diagnosis not present

## 2017-02-10 DIAGNOSIS — Z9981 Dependence on supplemental oxygen: Secondary | ICD-10-CM | POA: Insufficient documentation

## 2017-02-10 DIAGNOSIS — I272 Pulmonary hypertension, unspecified: Secondary | ICD-10-CM | POA: Insufficient documentation

## 2017-02-10 DIAGNOSIS — E785 Hyperlipidemia, unspecified: Secondary | ICD-10-CM | POA: Diagnosis not present

## 2017-02-10 DIAGNOSIS — N189 Chronic kidney disease, unspecified: Secondary | ICD-10-CM | POA: Diagnosis not present

## 2017-02-10 DIAGNOSIS — Z87891 Personal history of nicotine dependence: Secondary | ICD-10-CM | POA: Diagnosis not present

## 2017-02-10 DIAGNOSIS — I509 Heart failure, unspecified: Secondary | ICD-10-CM | POA: Diagnosis not present

## 2017-02-10 DIAGNOSIS — Z7982 Long term (current) use of aspirin: Secondary | ICD-10-CM | POA: Insufficient documentation

## 2017-02-10 DIAGNOSIS — J449 Chronic obstructive pulmonary disease, unspecified: Secondary | ICD-10-CM | POA: Diagnosis not present

## 2017-02-10 LAB — BASIC METABOLIC PANEL
Anion gap: 11 (ref 5–15)
BUN: 24 mg/dL — AB (ref 6–20)
CALCIUM: 9.7 mg/dL (ref 8.9–10.3)
CHLORIDE: 97 mmol/L — AB (ref 101–111)
CO2: 32 mmol/L (ref 22–32)
CREATININE: 1.06 mg/dL — AB (ref 0.44–1.00)
GFR calc Af Amer: 58 mL/min — ABNORMAL LOW (ref >60)
GFR calc non Af Amer: 50 mL/min — ABNORMAL LOW (ref >60)
Glucose, Bld: 143 mg/dL — ABNORMAL HIGH (ref 65–99)
POTASSIUM: 3.8 mmol/L (ref 3.5–5.1)
SODIUM: 140 mmol/L (ref 135–145)

## 2017-02-10 LAB — BRAIN NATRIURETIC PEPTIDE: B Natriuretic Peptide: 20.7 pg/mL (ref 0.0–100.0)

## 2017-02-10 NOTE — Progress Notes (Signed)
6 min walk test completed, pt ambulated 760 ft (231 m).  Pt started at out on 2 L O2 via [place] sats 93% HR 92, during the walk sats dropped to 83% and O2 was increased to 3L, sats increased to 89% and at end of test sats were 90% on 3L HR 116.

## 2017-02-10 NOTE — Patient Instructions (Signed)
You did great on your 6 minute walk!!!  Labs today  Your physician recommends that you schedule a follow-up appointment in: 2 months

## 2017-02-10 NOTE — Progress Notes (Signed)
Patient ID: TIMICA MARCOM, female   DOB: 07-12-1941, 76 y.o.   MRN: [id number] PCP: Dr Karlton Lemon Cardiology: Dr. Aundra Dubin  76 yo with history of COPD on home oxygen, untreated OSA, chronic diastolic CHF/RV failure, and pulmonary hypertension presents for cardiology followup.  She was admitted in 5/17 with acute on chronic diastolic CHF with prominent RV failure and marked shortness of breath.  She was diuresed with IV Lasix.  Echo showed dilated and dysfunctional RV with pulmonary hypertension.  RHC showed marked pulmonary hypertension with systemic PA pressure.  She was started on Revatio in the hospital and this was titrated up.  Subsequently, she was started on macitentan.  She next started Selexipag but she had intractable side effects with selexipag so stopped it.    She presents today for regular follow up.  Weight down 1 lb.  She feels like PH medications have helped her.  She is still waiting for Tyvaso, should be here by Friday.  Stable dyspnea, short of breath walking longer distances such as more than about 50 yards.  No orthopnea/PND.  No wheezing. Wearing her home oxygen at all times.  No chest pain.  BP high today but runs 120s/80s at home.    6 minute walk (7/17): 238 m 6 minute walk (10/17): 171 m 6 minute walk (today, 3/18): 231 m  Labs (5/17): K 4.7 => 4, creatinine 0.96 => 1.04 Labs (8/17): K 4.1, creatinine 1.5 Labs (9/17): K 3.5, creatinine 1.13 Labs (10/17): K 3.6, creatinine 1.0 Labs (1/18): K 3.9, creatinine 1.14, BNP 34 Labs (2/18): K 4.1, creatinine 1.07, BNP 25  PMH: 1. H/o CVA 2. DM 3. HTN 4. Hyperlipidemia 5. OSA: Cannot tolerate CPAP, has tried multiple masks.  6. COPD: She is on home oxygen. 7. SVT 8. Chronic diastolic CHF with RV failure: Echo (5/17) with EF 55%, severely dilated RV with moderately decreased systolic function, D-shaped interventricular septum.  9. Pulmonary hypertension: Suspect mixed group 3 PH (COPD, untreated OSA) and group 1 (elevated PA  pressure out of proportion to lung disease).  V/Q scan 5/17 with no evidence for chronic PE.  ANA weakly positive (1:80), ANCA negative, RF negative.  RHC (5/17) with mean RA 20, PA 110/45 mean 66, mean PCWP 20, CI 1.7, PVR 15 WU.  - Intractable side effects with selexipag.  10. CKD  Social History   Social History  . Marital status: Widowed    Spouse name: N/A  . Number of children: 2  . Years of education: N/A   Occupational History  . retired International aid/development worker [employer]   Social History Main Topics  . Smoking status: Former Smoker    Packs/day: 1.50    Years: 40.00    Types: Cigarettes    Quit date: 11/16/2004  . Smokeless tobacco: Former Systems developer  . Alcohol use No     Comment: occassional  . Drug use: No  . Sexual activity: Not on file   Other Topics Concern  . Not on file   Social History Narrative  . No narrative on file   Family History  Problem Relation Age of Onset  . Heart disease Mother   . Heart disease Brother   . Heart disease Brother   . Heart disease Sister   . Heart disease Sister   . Heart failure Mother   . Heart failure Father    ROS: All systems reviewed and negative except as per HPI.   Current Outpatient Prescriptions  Medication Sig Dispense  Refill  . albuterol (ACCUNEB) 0.63 MG/3ML nebulizer solution Take 1 ampule by nebulization every 6 (six) hours as needed for wheezing.     Marland Kitchen albuterol (PROAIR HFA) 108 (90 BASE) MCG/ACT inhaler 2 puffs every 4 hours as needed only  if your can't catch your breath (Patient taking differently: Inhale 1 puff into the lungs every 6 (six) hours as needed. 2 puffs every 4 hours as needed only  if your can't catch your breath) 1 Inhaler 11  . allopurinol (ZYLOPRIM) 300 MG tablet Take 300 mg by mouth daily.    Marland Kitchen aspirin 81 MG EC tablet Take 1 tablet (81 mg total) by mouth daily. 90 tablet 3  . atorvastatin (LIPITOR) 40 MG tablet Take 40 mg by mouth daily at 6 PM.    . B Complex-C (SUPER B COMPLEX/VITAMIN C) TABS Take 1  tablet by mouth daily.    . budesonide-formoterol (SYMBICORT) 160-4.5 MCG/ACT inhaler INHALE 2 PUFFS FIRST THING IN THE MORNING AND THEN ANOTHER 2 PUFFS ABOUT 12 HOURS LATER 1 Inhaler 11  . Cholecalciferol 1000 units tablet Take 1,000 Units by mouth daily.    . Coenzyme Q10 (COQ10) 200 MG CAPS Take 200 mg by mouth daily.    . Dulaglutide (TRULICITY) 1.61 WR/6.0AV SOPN Inject 0.75 mg into the skin every Monday.    . fexofenadine (ALLEGRA) 180 MG tablet Take 180 mg by mouth daily.    . furosemide (LASIX) 40 MG tablet Take 0.5-1 tablets (20-40 mg total) by mouth as directed. Take 40 mg in the AM and 20 mg in the PM 90 tablet 3  . Glycopyrrolate-Formoterol (BEVESPI AEROSPHERE) 9-4.8 MCG/ACT AERO Inhale 2 puffs into the lungs 2 (two) times daily.    Marland Kitchen loratadine (CLARITIN) 10 MG tablet Take 1 tablet (10 mg total) by mouth daily. 30 tablet 1  . Macitentan (OPSUMIT) 10 MG TABS Take 1 tablet (10 mg total) by mouth daily. 30 tablet 11  . meloxicam (MOBIC) 15 MG tablet Take 15 mg by mouth daily as needed for pain.    . metFORMIN (GLUCOPHAGE) 500 MG tablet Take 500 mg by mouth 2 (two) times daily with a meal.    . metoprolol succinate (TOPROL-XL) 25 MG 24 hr tablet TAKE 1 TABLET BY MOUTH EVERY DAY 90 tablet 1  . mometasone-formoterol (DULERA) 200-5 MCG/ACT AERO Inhale 2 puffs into the lungs 2 (two) times daily.    . Omega 3 1000 MG CAPS Take 1 capsule by mouth daily.    Marland Kitchen omeprazole (PRILOSEC) 40 MG capsule Take 40 mg by mouth daily.    . Potassium Chloride ER 20 MEQ TBCR Take 20 mEq by mouth daily. 90 tablet 3  . sildenafil (REVATIO) 20 MG tablet TAKE 4 TABLETS (80 MG TOTAL) BY MOUTH 3 (THREE) TIMES DAILY. 360 tablet 6  . SYMBICORT 160-4.5 MCG/ACT inhaler INHALE 2 PUFFS FIRST THING IN THE MORNING AND THEN ANOTHER 2 PUFFS ABOUT 12 HOURS LATER 1 Inhaler 11  . valsartan-hydrochlorothiazide (DIOVAN-HCT) 160-25 MG tablet Take 1 tablet by mouth daily.    . Treprostinil (TYVASO REFILL) 0.6 MG/ML SOLN Inhale 76  mcg into the lungs 4 (four) times daily. (Patient not taking: Reported on 01/06/2017) 3.6 mL 11   No current facility-administered medications for this encounter.    BP (!) 170/70   Pulse 90   Wt 141 lb 12 oz (64.3 kg)   SpO2 91%   BMI 25.93 kg/m    Wt Readings from Last 3 Encounters:  02/10/17 141 lb 12  oz (64.3 kg)  01/06/17 142 lb 4 oz (64.5 kg)  11/13/16 148 lb (67.1 kg)   General: Elderly. NAD Neck: JVP 7 cm. No thyromegaly or thyroid nodule.  Lungs: Breath sounds distant, no wheezing.    CV: Nondisplaced PMI.  Heart regular S1/S2, no S3/S4, no murmur.  No edema.   No carotid bruit.  Normal pedal pulses.  Abdomen: Soft, NT, mildly distended, no HSM. No bruits or masses. +BS  Neurologic: Alert and oriented x 3.  Psych: Normal affect. Extremities: No clubbing or cyanosis.  HEENT: Normal.   Assessment/Plan: 1. 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 She was very short of breath with exertion and had very elevated right-sided filling pressures on 5/17 RHC.  PA pressure was systemic. Cardiac output low, but not moving towards IV Flolan given the mixed etiology of her PH. V/Q scan not suggestive of chronic PEs and autoimmune serologies sent (RF negative, ANCA negative, ANA only weakly positive - 1:80). 6 minute walk better today.  She feels like PH meds have helped.  - Continue Revatio 80 mg tid.  - Continue Opsumit 10 mg daily.  - Intolerant to selexipag.  She has been approved for Tyvaso, should be coming on Friday.  This may be a good choice given her parenchymal lung disease as it can theoretically limit worsening of V/Q mismatch from pulmonary vasodilation.   - Repeat echo 5/18.  2. COPD: On home oxygen.  Prior long-time smoker. Stable today.  - Continue home inhalers.  3. Chronic diastolic CHF with prominent RV failure: Volume status looks ok on exam today. - Continue Lasix  40 mg q am and 20 mg q pm.      - BMET/BNP today.  4. SVT: No palpitations.  She is on Toprol XL.  5. OSA: Has been unable to tolerate CPAP.  She uses oxygen at night.  OSA may have small contribution to [facility] but does not explain the extent of PAH.   Followup in 2 months, will need repeat 6 minute walk at that time on Tyvaso.   Loralie Champagne 02/10/2017

## 2017-02-15 ENCOUNTER — Telehealth (HOSPITAL_COMMUNITY): Payer: Self-pay | Admitting: Pharmacist

## 2017-02-15 NOTE — Telephone Encounter (Signed)
Beth Dago, RN with [facility], called stating she went to Beth [name]'s home on Friday 3/30 to start her on Tyvaso. She stated that Beth [name] as well as her daughter and a family friend were educated on Tyvaso and she watched her administer her first dose and stayed with her for 1 hour. She monitored her BP q 15 mins (start - 144/80, end - 138/74), she was 93% on 2L [place] and HR 80 bpm. [name], RN will follow up with Beth [name] in 1 week to see how she is doing.   Beth [name]. Beth [name], PharmD, BCPS, CPP Clinical Pharmacist Pager: [phone] Phone: [phone] 02/15/2017 3:49 PM

## 2017-03-11 ENCOUNTER — Other Ambulatory Visit (HOSPITAL_COMMUNITY): Payer: Self-pay | Admitting: Cardiology

## 2017-03-11 MED ORDER — POTASSIUM CHLORIDE ER 20 MEQ PO TBCR: 20.0000 meq | EXTENDED_RELEASE_TABLET | Freq: Every day | ORAL | 3 refills | Status: DC

## 2017-04-06 ENCOUNTER — Telehealth (HOSPITAL_COMMUNITY): Payer: Self-pay | Admitting: Pharmacist

## 2017-04-06 NOTE — Telephone Encounter (Signed)
Try to stay with it for a little while, hopefully cough will ease off.  Can discuss at appointment.

## 2017-04-06 NOTE — Telephone Encounter (Signed)
Jordan Hawks, Academic librarian, saw Beth [name] today for management of her Tyvaso. She stated that when she titrated up to 9 breaths/day, she had intolerable diarrhea so went back to 6 breaths/day. The diarrhea has resolved but she doesn't like the cough associated with the Tyvaso and asked if she could be switched to [facility]. She has an appointment with Dr. Aundra Dubin on 5/29 so will forward to him so that he can address this at her visit next week.   [name]. Velva Harman, PharmD, BCPS, CPP Clinical Pharmacist Pager: [phone] Phone: [phone] 04/06/2017 2:26 PM

## 2017-04-13 ENCOUNTER — Ambulatory Visit (HOSPITAL_COMMUNITY)
Admission: RE | Admit: 2017-04-13 | Discharge: 2017-04-13 | Disposition: A | Discharge status: Home / Self Care | Payer: Medicare HMO | Source: Ambulatory Visit | Attending: Cardiology | Admitting: Cardiology

## 2017-04-13 VITALS — BP 136/60 | HR 84 | Wt 143.5 lb

## 2017-04-13 DIAGNOSIS — Z9981 Dependence on supplemental oxygen: Secondary | ICD-10-CM | POA: Insufficient documentation

## 2017-04-13 DIAGNOSIS — I10 Essential (primary) hypertension: Secondary | ICD-10-CM | POA: Diagnosis not present

## 2017-04-13 DIAGNOSIS — E1122 Type 2 diabetes mellitus with diabetic chronic kidney disease: Secondary | ICD-10-CM | POA: Insufficient documentation

## 2017-04-13 DIAGNOSIS — I272 Pulmonary hypertension, unspecified: Secondary | ICD-10-CM | POA: Diagnosis present

## 2017-04-13 DIAGNOSIS — I4510 Unspecified right bundle-branch block: Secondary | ICD-10-CM | POA: Diagnosis not present

## 2017-04-13 DIAGNOSIS — E785 Hyperlipidemia, unspecified: Secondary | ICD-10-CM | POA: Insufficient documentation

## 2017-04-13 DIAGNOSIS — N189 Chronic kidney disease, unspecified: Secondary | ICD-10-CM | POA: Diagnosis not present

## 2017-04-13 DIAGNOSIS — Z8673 Personal history of transient ischemic attack (TIA), and cerebral infarction without residual deficits: Secondary | ICD-10-CM | POA: Insufficient documentation

## 2017-04-13 DIAGNOSIS — Z87891 Personal history of nicotine dependence: Secondary | ICD-10-CM | POA: Diagnosis not present

## 2017-04-13 DIAGNOSIS — J449 Chronic obstructive pulmonary disease, unspecified: Secondary | ICD-10-CM | POA: Insufficient documentation

## 2017-04-13 DIAGNOSIS — G4733 Obstructive sleep apnea (adult) (pediatric): Secondary | ICD-10-CM | POA: Insufficient documentation

## 2017-04-13 DIAGNOSIS — I5032 Chronic diastolic (congestive) heart failure: Secondary | ICD-10-CM | POA: Diagnosis present

## 2017-04-13 DIAGNOSIS — Z7951 Long term (current) use of inhaled steroids: Secondary | ICD-10-CM | POA: Diagnosis not present

## 2017-04-13 DIAGNOSIS — I50810 Right heart failure, unspecified: Secondary | ICD-10-CM

## 2017-04-13 DIAGNOSIS — I509 Heart failure, unspecified: Secondary | ICD-10-CM | POA: Diagnosis not present

## 2017-04-13 DIAGNOSIS — Z7982 Long term (current) use of aspirin: Secondary | ICD-10-CM | POA: Diagnosis not present

## 2017-04-13 DIAGNOSIS — I130 Hypertensive heart and chronic kidney disease with heart failure and stage 1 through stage 4 chronic kidney disease, or unspecified chronic kidney disease: Secondary | ICD-10-CM | POA: Diagnosis present

## 2017-04-13 DIAGNOSIS — I471 Supraventricular tachycardia: Secondary | ICD-10-CM | POA: Diagnosis not present

## 2017-04-13 DIAGNOSIS — Z7984 Long term (current) use of oral hypoglycemic drugs: Secondary | ICD-10-CM | POA: Diagnosis not present

## 2017-04-13 LAB — BASIC METABOLIC PANEL
Anion gap: 8 (ref 5–15)
BUN: 43 mg/dL — AB (ref 6–20)
CHLORIDE: 101 mmol/L (ref 101–111)
CO2: 31 mmol/L (ref 22–32)
CREATININE: 1.01 mg/dL — AB (ref 0.44–1.00)
Calcium: 9.5 mg/dL (ref 8.9–10.3)
GFR calc Af Amer: >60 mL/min (ref >60)
GFR calc non Af Amer: 53 mL/min — ABNORMAL LOW (ref >60)
GLUCOSE: 157 mg/dL — AB (ref 65–99)
Potassium: 4.4 mmol/L (ref 3.5–5.1)
SODIUM: 140 mmol/L (ref 135–145)

## 2017-04-13 LAB — BRAIN NATRIURETIC PEPTIDE: B NATRIURETIC PEPTIDE 5: 21.2 pg/mL (ref 0.0–100.0)

## 2017-04-13 NOTE — Patient Instructions (Signed)
Stop Tyvaso  Start Adempas, we have sent the referral and prescription to specialty pharmacy, once they get approval with your insurance they will contact you before shipping the medication to you.  Once you get this mediation you will STOP REVATIO and start it.  Labs today  Your physician has requested that you have an echocardiogram. Echocardiography is a painless test that uses sound waves to create images of your heart. It provides your doctor with information about the size and shape of your heart and how well your heart's chambers and valves are working. This procedure takes approximately one hour. There are no restrictions for this procedure.  Your physician recommends that you schedule a follow-up appointment in: 6 weeks

## 2017-04-13 NOTE — Progress Notes (Signed)
Patient ID: Beth [name], female   DOB: 1940-11-26, 76 y.o.   MRN: [id number] PCP: Dr Karlton Lemon Cardiology: Dr. Aundra Dubin  76 yo with history of COPD on home oxygen, untreated OSA, chronic diastolic CHF/RV failure, and pulmonary hypertension presents for cardiology followup.  She was admitted in 5/17 with acute on chronic diastolic CHF with prominent RV failure and marked shortness of breath.  She was diuresed with IV Lasix.  Echo showed dilated and dysfunctional RV with pulmonary hypertension.  RHC showed marked pulmonary hypertension with systemic PA pressure.  She was started on Revatio in the hospital and this was titrated up.  Subsequently, she was started on macitentan.  She next started Selexipag but she had intractable side effects with selexipag so stopped it.  I then tried her on Tyvaso, but she has developed abdominal discomfort and diarrhea with Tyvaso and has had to stop it (symptomatic even at lowest dose). She did not feel like it helped her breathing when she was on it.   Weight is up 2 lbs.  Breathing is stable. Short of breath walking longer distances such as more than about 50 yards. No orthopnea/PND.  No wheezing. Wearing her home oxygen at all times.  No chest pain.  BP controlled.  No lightheadedness/falls.   6 minute walk (7/17): 238 m 6 minute walk (10/17): 171 m 6 minute walk (today, 3/18): 231 m  Labs (5/17): K 4.7 => 4, creatinine 0.96 => 1.04 Labs (8/17): K 4.1, creatinine 1.5 Labs (9/17): K 3.5, creatinine 1.13 Labs (10/17): K 3.6, creatinine 1.0 Labs (1/18): K 3.9, creatinine 1.14, BNP 34 Labs (2/18): K 4.1, creatinine 1.07, BNP 25 Labs (3/18): K 3.8, creatinine 1.06, BNP 20  ECG (personally reviewed): NSR, RBBB  PMH: 1. H/o CVA 2. DM 3. HTN 4. Hyperlipidemia 5. OSA: Cannot tolerate CPAP, has tried multiple masks.  6. COPD: She is on home oxygen. 7. SVT 8. Chronic diastolic CHF with RV failure: Echo (5/17) with EF 55%, severely dilated RV with moderately  decreased systolic function, D-shaped interventricular septum.  9. 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  V/Q scan 5/17 with no evidence for chronic PE.  ANA weakly positive (1:80), ANCA negative, RF negative.  RHC (5/17) with mean RA 20, PA 110/45 mean 66, mean PCWP 20, CI 1.7, PVR 15 WU.  - Intractable side effects with selexipag.  10. CKD  Social History   Social History  . Marital status: Widowed    Spouse name: N/A  . Number of children: 2  . Years of education: N/A   Occupational History  . retired International aid/development worker [employer]   Social History Main Topics  . Smoking status: Former Smoker    Packs/day: 1.50    Years: 40.00    Types: Cigarettes    Quit date: 11/16/2004  . Smokeless tobacco: Former Systems developer  . Alcohol use No     Comment: occassional  . Drug use: No  . Sexual activity: Not on file   Other Topics Concern  . Not on file   Social History Narrative  . No narrative on file   Family History  Problem Relation Age of Onset  . Heart disease Mother   . Heart disease Brother   . Heart disease Brother   . Heart disease Sister   . Heart disease Sister   . Heart failure Mother   . Heart failure  Father    ROS: All systems reviewed and negative except as per HPI.   Current Outpatient Prescriptions  Medication Sig Dispense Refill  . albuterol (ACCUNEB) 0.63 MG/3ML nebulizer solution Take 1 ampule by nebulization every 6 (six) hours as needed for wheezing.     Marland Kitchen albuterol (PROAIR HFA) 108 (90 BASE) MCG/ACT inhaler 2 puffs every 4 hours as needed only  if your can't catch your breath (Patient taking differently: Inhale 1 puff into the lungs every 6 (six) hours as needed. 2 puffs every 4 hours as needed only  if your can't catch your breath) 1 Inhaler 11  . allopurinol (ZYLOPRIM) 300 MG tablet Take 300 mg by mouth daily.    Marland Kitchen aspirin 81 MG EC tablet Take 1 tablet (81 mg  total) by mouth daily. 90 tablet 3  . atorvastatin (LIPITOR) 40 MG tablet Take 40 mg by mouth daily at 6 PM.    . B Complex-C (SUPER B COMPLEX/VITAMIN C) TABS Take 1 tablet by mouth daily.    . budesonide-formoterol (SYMBICORT) 160-4.5 MCG/ACT inhaler INHALE 2 PUFFS FIRST THING IN THE MORNING AND THEN ANOTHER 2 PUFFS ABOUT 12 HOURS LATER 1 Inhaler 11  . Cholecalciferol 1000 units tablet Take 1,000 Units by mouth daily.    . Coenzyme Q10 (COQ10) 200 MG CAPS Take 200 mg by mouth daily.    . Dulaglutide (TRULICITY) 1.02 VO/5.3GU SOPN Inject 0.75 mg into the skin every Monday.    . fexofenadine (ALLEGRA) 180 MG tablet Take 180 mg by mouth daily.    . furosemide (LASIX) 40 MG tablet Take 0.5-1 tablets (20-40 mg total) by mouth as directed. Take 40 mg in the AM and 20 mg in the PM 90 tablet 3  . Glycopyrrolate-Formoterol (BEVESPI AEROSPHERE) 9-4.8 MCG/ACT AERO Inhale 2 puffs into the lungs 2 (two) times daily.    Marland Kitchen loratadine (CLARITIN) 10 MG tablet Take 1 tablet (10 mg total) by mouth daily. 30 tablet 1  . Macitentan (OPSUMIT) 10 MG TABS Take 1 tablet (10 mg total) by mouth daily. 30 tablet 11  . meloxicam (MOBIC) 15 MG tablet Take 15 mg by mouth daily as needed for pain.    . metFORMIN (GLUCOPHAGE) 500 MG tablet Take 500 mg by mouth 2 (two) times daily with a meal.    . metoprolol succinate (TOPROL-XL) 25 MG 24 hr tablet TAKE 1 TABLET BY MOUTH EVERY DAY 90 tablet 1  . mometasone-formoterol (DULERA) 200-5 MCG/ACT AERO Inhale 2 puffs into the lungs 2 (two) times daily.    . Omega 3 1000 MG CAPS Take 1 capsule by mouth daily.    Marland Kitchen omeprazole (PRILOSEC) 40 MG capsule Take 40 mg by mouth daily.    . Potassium Chloride ER 20 MEQ TBCR Take 20 mEq by mouth daily. 90 tablet 3  . sildenafil (REVATIO) 20 MG tablet TAKE 4 TABLETS (80 MG TOTAL) BY MOUTH 3 (THREE) TIMES DAILY. 360 tablet 6  . valsartan-hydrochlorothiazide (DIOVAN-HCT) 160-25 MG tablet Take 1 tablet by mouth daily.     No current  facility-administered medications for this encounter.    BP 136/60   Pulse 84   Wt 143 lb 8 oz (65.1 kg)   SpO2 98% Comment: 2 L  BMI 26.25 kg/m    Wt Readings from Last 3 Encounters:  04/13/17 143 lb 8 oz (65.1 kg)  02/10/17 141 lb 12 oz (64.3 kg)  01/06/17 142 lb 4 oz (64.5 kg)   General: Elderly. NAD Neck: JVP 8 cm. No  thyromegaly or thyroid nodule.  Lungs: Distant breath sounds.    CV: Nondisplaced PMI.  Heart regular S1/S2, no S3/S4, no murmur.  No edema.   No carotid bruit.  Normal pedal pulses.  Abdomen: Soft, NT, mildly distended, no HSM. No bruits or masses. +BS  Neurologic: Alert and oriented x 3.  Psych: Normal affect. Extremities: No clubbing or cyanosis.  HEENT: Normal.   Assessment/Plan: 1. 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 She was very short of breath with exertion and had very elevated right-sided filling pressures on 5/17 RHC.  PA pressure was systemic. Cardiac output low, but not moving towards IV Flolan given the mixed etiology of her PH. V/Q scan not suggestive of chronic PEs and autoimmune serologies sent (RF negative, ANCA negative, ANA only weakly positive - 1:80).She has been unable to tolerate selexipag or Tyvaso.  - Continue Revatio 80 mg tid.  I will work on transitioning Revatio to riociguat to see if this is more effective.  - Continue Opsumit 10 mg daily.  - Agree with stopping Tyvaso.   - Arrange for repeat echo.  - Still with some abdominal symptoms (presumed from Tyvaso).  Will hold off on 6 minute walk until next appt.   2. COPD: On home oxygen.  Prior long-time smoker. Stable today.  - Continue home inhalers.  3. Chronic diastolic CHF with prominent RV failure: Volume status looks ok on exam today. - Continue Lasix 40 mg q am and 20 mg q pm.      - BMET/BNP today.  4. SVT: No palpitations.  She is on Toprol XL.  5. OSA: Has been unable  to tolerate CPAP.  She uses oxygen at night.  OSA may have small contribution to [facility] but does not explain the extent of PAH.   Followup in 6 wks.   Loralie Champagne 04/13/2017

## 2017-04-20 ENCOUNTER — Telehealth (HOSPITAL_COMMUNITY): Payer: Self-pay | Admitting: Pharmacist

## 2017-04-20 NOTE — Telephone Encounter (Signed)
Adempas 0.5-2.5 mg TID PA approved by [employer] Part D through 04/20/19.   [name]. Velva Harman, PharmD, BCPS, CPP Clinical Pharmacist Pager: [phone] Phone: [phone] 04/20/2017 12:08 PM

## 2017-05-07 ENCOUNTER — Other Ambulatory Visit (HOSPITAL_COMMUNITY): Payer: Self-pay | Admitting: Cardiology

## 2017-05-07 DIAGNOSIS — I509 Heart failure, unspecified: Secondary | ICD-10-CM

## 2017-06-04 ENCOUNTER — Other Ambulatory Visit (HOSPITAL_COMMUNITY): Payer: Self-pay | Admitting: Cardiology

## 2017-06-04 DIAGNOSIS — I509 Heart failure, unspecified: Secondary | ICD-10-CM

## 2017-06-07 ENCOUNTER — Ambulatory Visit (HOSPITAL_BASED_OUTPATIENT_CLINIC_OR_DEPARTMENT_OTHER)
Admission: RE | Admit: 2017-06-07 | Discharge: 2017-06-07 | Disposition: A | Discharge status: Home / Self Care | Payer: Medicare HMO | Source: Ambulatory Visit | Attending: Cardiology | Admitting: Cardiology

## 2017-06-07 ENCOUNTER — Encounter (HOSPITAL_COMMUNITY): Payer: Medicare HMO | Admitting: Cardiology

## 2017-06-07 ENCOUNTER — Ambulatory Visit (HOSPITAL_COMMUNITY)
Admission: RE | Admit: 2017-06-07 | Discharge: 2017-06-07 | Disposition: A | Discharge status: Home / Self Care | Payer: Medicare HMO | Source: Ambulatory Visit | Attending: Cardiology | Admitting: Cardiology

## 2017-06-07 ENCOUNTER — Encounter (HOSPITAL_COMMUNITY): Payer: Self-pay | Admitting: Cardiology

## 2017-06-07 VITALS — BP 95/72 | HR 92 | Wt 137.2 lb

## 2017-06-07 DIAGNOSIS — I5032 Chronic diastolic (congestive) heart failure: Secondary | ICD-10-CM

## 2017-06-07 DIAGNOSIS — Z87891 Personal history of nicotine dependence: Secondary | ICD-10-CM | POA: Diagnosis not present

## 2017-06-07 DIAGNOSIS — I50810 Right heart failure, unspecified: Secondary | ICD-10-CM

## 2017-06-07 DIAGNOSIS — R0600 Dyspnea, unspecified: Secondary | ICD-10-CM

## 2017-06-07 DIAGNOSIS — G4733 Obstructive sleep apnea (adult) (pediatric): Secondary | ICD-10-CM | POA: Insufficient documentation

## 2017-06-07 DIAGNOSIS — I340 Nonrheumatic mitral (valve) insufficiency: Secondary | ICD-10-CM | POA: Diagnosis not present

## 2017-06-07 DIAGNOSIS — I509 Heart failure, unspecified: Secondary | ICD-10-CM | POA: Diagnosis not present

## 2017-06-07 DIAGNOSIS — Z7984 Long term (current) use of oral hypoglycemic drugs: Secondary | ICD-10-CM | POA: Insufficient documentation

## 2017-06-07 DIAGNOSIS — Z7982 Long term (current) use of aspirin: Secondary | ICD-10-CM | POA: Diagnosis not present

## 2017-06-07 DIAGNOSIS — Z8249 Family history of ischemic heart disease and other diseases of the circulatory system: Secondary | ICD-10-CM | POA: Insufficient documentation

## 2017-06-07 DIAGNOSIS — E1122 Type 2 diabetes mellitus with diabetic chronic kidney disease: Secondary | ICD-10-CM | POA: Diagnosis not present

## 2017-06-07 DIAGNOSIS — I272 Pulmonary hypertension, unspecified: Secondary | ICD-10-CM | POA: Insufficient documentation

## 2017-06-07 DIAGNOSIS — Z8673 Personal history of transient ischemic attack (TIA), and cerebral infarction without residual deficits: Secondary | ICD-10-CM | POA: Diagnosis not present

## 2017-06-07 DIAGNOSIS — I130 Hypertensive heart and chronic kidney disease with heart failure and stage 1 through stage 4 chronic kidney disease, or unspecified chronic kidney disease: Secondary | ICD-10-CM | POA: Insufficient documentation

## 2017-06-07 DIAGNOSIS — J441 Chronic obstructive pulmonary disease with (acute) exacerbation: Secondary | ICD-10-CM | POA: Diagnosis not present

## 2017-06-07 DIAGNOSIS — N189 Chronic kidney disease, unspecified: Secondary | ICD-10-CM | POA: Diagnosis not present

## 2017-06-07 DIAGNOSIS — Z9981 Dependence on supplemental oxygen: Secondary | ICD-10-CM | POA: Insufficient documentation

## 2017-06-07 DIAGNOSIS — E785 Hyperlipidemia, unspecified: Secondary | ICD-10-CM | POA: Diagnosis not present

## 2017-06-07 DIAGNOSIS — Z79899 Other long term (current) drug therapy: Secondary | ICD-10-CM | POA: Insufficient documentation

## 2017-06-07 DIAGNOSIS — I517 Cardiomegaly: Secondary | ICD-10-CM | POA: Diagnosis not present

## 2017-06-07 DIAGNOSIS — J449 Chronic obstructive pulmonary disease, unspecified: Secondary | ICD-10-CM | POA: Diagnosis not present

## 2017-06-07 DIAGNOSIS — I350 Nonrheumatic aortic (valve) stenosis: Secondary | ICD-10-CM | POA: Insufficient documentation

## 2017-06-07 LAB — BASIC METABOLIC PANEL
Anion gap: 9 (ref 5–15)
BUN: 43 mg/dL — AB (ref 6–20)
CO2: 35 mmol/L — AB (ref 22–32)
CREATININE: 1.4 mg/dL — AB (ref 0.44–1.00)
Calcium: 9.6 mg/dL (ref 8.9–10.3)
Chloride: 95 mmol/L — ABNORMAL LOW (ref 101–111)
GFR calc Af Amer: 41 mL/min — ABNORMAL LOW (ref >60)
GFR calc non Af Amer: 35 mL/min — ABNORMAL LOW (ref >60)
GLUCOSE: 161 mg/dL — AB (ref 65–99)
POTASSIUM: 4.6 mmol/L (ref 3.5–5.1)
Sodium: 139 mmol/L (ref 135–145)

## 2017-06-07 MED ORDER — PREDNISONE 20 MG PO TABS: ORAL_TABLET | ORAL | 0 refills | Status: DC

## 2017-06-07 MED ORDER — DOXYCYCLINE HYCLATE 100 MG PO CAPS: 100.0000 mg | ORAL_CAPSULE | Freq: Two times a day (BID) | ORAL | 0 refills | Status: AC

## 2017-06-07 NOTE — Patient Instructions (Signed)
Start Doxycycline 100 mg Twice daily   Start Prednisone 40 mg (2 tabs) daily for 2 DAYS then take 20 mg (1 tab) daily for 3 days  Labs today  Chest x-ray today  You have been referred to Dr Melvyn Novas  Your physician recommends that you schedule a follow-up appointment in: 6 weeks

## 2017-06-07 NOTE — Progress Notes (Signed)
  Echocardiogram 2D Echocardiogram has been performed.  Jennette Dubin 06/07/2017, 12:04 PM

## 2017-06-08 NOTE — Progress Notes (Signed)
Patient ID: Beth [name], female   DOB: 04-25-1941, 76 y.o.   MRN: [id number] PCP: Dr Karlton Lemon Cardiology: Dr. Aundra Dubin  76 yo with history of COPD on home oxygen, untreated OSA, chronic diastolic CHF/RV failure, and pulmonary hypertension presents for cardiology followup.  She was admitted in 5/17 with acute on chronic diastolic CHF with prominent RV failure and marked shortness of breath.  She was diuresed with IV Lasix.  Echo showed dilated and dysfunctional RV with pulmonary hypertension.  RHC showed marked pulmonary hypertension with systemic PA pressure.  She was started on Revatio in the hospital and this was titrated up.  Subsequently, she was started on macitentan.  She next started Selexipag but she had intractable side effects with selexipag so stopped it.  I then tried her on Tyvaso, but she has developed abdominal discomfort and diarrhea with Tyvaso and has had to stop it (symptomatic even at lowest dose). She did not feel like it helped her breathing when she was on it.  She has now stopped sildenafil and started riociguat.   Weight is down 6 lbs.  For the last week, she has felt more short of breath.  She has been wheezing.  She feels like she has a COPD exacerbation.  No orthopnea/PND.  No chest pain.  She is short of breath walking 20-30 feet. Echo was done today, this showed normal LV EF with some improvement in RV function, estimated PASP 41 mmHg.   6 minute walk (7/17): 238 m 6 minute walk (10/17): 171 m 6 minute walk (today, 3/18): 231 m  Labs (5/17): K 4.7 => 4, creatinine 0.96 => 1.04 Labs (8/17): K 4.1, creatinine 1.5 Labs (9/17): K 3.5, creatinine 1.13 Labs (10/17): K 3.6, creatinine 1.0 Labs (1/18): K 3.9, creatinine 1.14, BNP 34 Labs (2/18): K 4.1, creatinine 1.07, BNP 25 Labs (3/18): K 3.8, creatinine 1.06, BNP 25  PMH: 1. H/o CVA 2. DM 3. HTN 4. Hyperlipidemia 5. OSA: Cannot tolerate CPAP, has tried multiple masks.  6. COPD: She is on home oxygen. 7. SVT 8.  Chronic diastolic CHF with RV failure: Echo (5/17) with EF 55%, severely dilated RV with moderately decreased systolic function, D-shaped interventricular septum.  - Echo (7/18): EF 60-65%, D-shaped interventricular septum, mild RV dilation with normal systolic function, PASP 41, IVC normal, mild MR, mild AS.  9. 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  V/Q scan 5/17 with no evidence for chronic PE.  ANA weakly positive (1:80), ANCA negative, RF negative.  RHC (5/17) with mean RA 20, PA 110/45 mean 66, mean PCWP 20, CI 1.7, PVR 15 WU.  - Intractable side effects with selexipag.  - Did not tolerate Tyvaso 10. CKD  Social History   Social History  . Marital status: Widowed    Spouse name: N/A  . Number of children: 2  . Years of education: N/A   Occupational History  . retired International aid/development worker [employer]   Social History Main Topics  . Smoking status: Former Smoker    Packs/day: 1.50    Years: 40.00    Types: Cigarettes    Quit date: 11/16/2004  . Smokeless tobacco: Former Systems developer  . Alcohol use No     Comment: occassional  . Drug use: No  . Sexual activity: Not on file   Other Topics Concern  . Not on file   Social History Narrative  .  No narrative on file   Family History  Problem Relation Age of Onset  . Heart disease Mother   . Heart disease Brother   . Heart disease Brother   . Heart disease Sister   . Heart disease Sister   . Heart failure Mother   . Heart failure Father    ROS: All systems reviewed and negative except as per HPI.   Current Outpatient Prescriptions  Medication Sig Dispense Refill  . albuterol (ACCUNEB) 0.63 MG/3ML nebulizer solution Take 1 ampule by nebulization every 6 (six) hours as needed for wheezing.     Marland Kitchen albuterol (PROAIR HFA) 108 (90 BASE) MCG/ACT inhaler 2 puffs every 4 hours as needed only  if your can't catch your breath (Patient taking  differently: Inhale 1 puff into the lungs every 6 (six) hours as needed. 2 puffs every 4 hours as needed only  if your can't catch your breath) 1 Inhaler 11  . allopurinol (ZYLOPRIM) 300 MG tablet Take 300 mg by mouth daily.    Marland Kitchen aspirin 81 MG EC tablet Take 1 tablet (81 mg total) by mouth daily. 90 tablet 3  . atorvastatin (LIPITOR) 40 MG tablet Take 40 mg by mouth daily at 6 PM.    . B Complex-C (SUPER B COMPLEX/VITAMIN C) TABS Take 1 tablet by mouth daily.    . budesonide-formoterol (SYMBICORT) 160-4.5 MCG/ACT inhaler INHALE 2 PUFFS FIRST THING IN THE MORNING AND THEN ANOTHER 2 PUFFS ABOUT 12 HOURS LATER 1 Inhaler 11  . Cholecalciferol 1000 units tablet Take 1,000 Units by mouth daily.    . Coenzyme Q10 (COQ10) 200 MG CAPS Take 200 mg by mouth daily.    . Dulaglutide (TRULICITY) 5.00 BB/0.4UG SOPN Inject 0.75 mg into the skin every Monday.    . fexofenadine (ALLEGRA) 180 MG tablet Take 180 mg by mouth daily.    . furosemide (LASIX) 40 MG tablet Take 0.5-1 tablets (20-40 mg total) by mouth as directed. Take 40 mg in the AM and 20 mg in the PM 90 tablet 3  . Glycopyrrolate-Formoterol (BEVESPI AEROSPHERE) 9-4.8 MCG/ACT AERO Inhale 2 puffs into the lungs 2 (two) times daily.    Marland Kitchen loratadine (CLARITIN) 10 MG tablet Take 1 tablet (10 mg total) by mouth daily. 30 tablet 1  . Macitentan (OPSUMIT) 10 MG TABS Take 1 tablet (10 mg total) by mouth daily. 30 tablet 11  . metFORMIN (GLUCOPHAGE) 500 MG tablet Take 500 mg by mouth 2 (two) times daily with a meal.    . mometasone-formoterol (DULERA) 200-5 MCG/ACT AERO Inhale 2 puffs into the lungs 2 (two) times daily.    . Omega 3 1000 MG CAPS Take 1 capsule by mouth daily.    Marland Kitchen omeprazole (PRILOSEC) 40 MG capsule Take 40 mg by mouth daily.    . Potassium Chloride ER 20 MEQ TBCR Take 20 mEq by mouth daily. 90 tablet 3  . Riociguat (ADEMPAS) 0.5 MG TABS Take 1 tablet by mouth 3 (three) times daily.    . valsartan-hydrochlorothiazide (DIOVAN-HCT) 160-25 MG  tablet Take 1 tablet by mouth daily.    Marland Kitchen doxycycline (VIBRAMYCIN) 100 MG capsule Take 1 capsule (100 mg total) by mouth 2 (two) times daily. 14 capsule 0  . meloxicam (MOBIC) 15 MG tablet Take 15 mg by mouth daily as needed for pain.    . predniSONE (DELTASONE) 20 MG tablet Take 2 tabs daily for 2 days then 1 tab daily for 3 days 7 tablet 0   No current facility-administered  medications for this encounter.    BP 95/72   Pulse 92   Wt 137 lb 4 oz (62.3 kg)   SpO2 100% Comment: on 2L of O2  BMI 25.10 kg/m    Wt Readings from Last 3 Encounters:  06/07/17 137 lb 4 oz (62.3 kg)  04/13/17 143 lb 8 oz (65.1 kg)  02/10/17 141 lb 12 oz (64.3 kg)   General: NAD Neck: No JVD, no thyromegaly or thyroid nodule.  Lungs: Distant breath sounds CV: Nondisplaced PMI.  Heart regular S1/S2 with widely split S2, no S3/S4, no murmur.  No peripheral edema.  No carotid bruit.  Normal pedal pulses.  Abdomen: Soft, nontender, no hepatosplenomegaly, no distention.  Skin: Intact without lesions or rashes.  Neurologic: Alert and oriented x 3.  Psych: Normal affect. Extremities: No clubbing or cyanosis.  HEENT: Normal.   Assessment/Plan: 1. 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 She was very short of breath with exertion and had very elevated right-sided filling pressures on 5/17 RHC.  PA pressure was systemic. Cardiac output low, but not moving towards IV Flolan given the mixed etiology of her PH. V/Q scan not suggestive of chronic PEs and autoimmune serologies sent (RF negative, ANCA negative, ANA only weakly positive (1:80).She has been unable to tolerate selexipag or Tyvaso. Echo done today was reviewed and appears to show some improvement in RV function.  - We recently transitioned from sildenafil to riociguat.  Continue to titrate up riociguat.  - Continue Opsumit 10 mg daily.  - No 6 minute walk  today, concerned for COPD exacerbation.  2. COPD: On home oxygen.  Prior long-time smoker. Possible exacerbation.  She is not volume overloaded but has worsened dyspnea for about a week along with some wheezing.  - Continue home inhalers.  - Will give short course of prednisone.  - Will give doxycycline 100 mg bid x 7 days.  - CXR today.  3. Chronic diastolic CHF with prominent RV failure: Volume status looks ok on exam today. - Continue Lasix 40 mg q am and 20 mg q pm.      - BMET today.  4. SVT: No palpitations.  She is on Toprol XL (add back to med list).  5. OSA: Has been unable to tolerate CPAP.  She uses oxygen at night.  OSA may have small contribution to [facility] but does not explain the extent of PAH.   Followup in 6 wks.   Loralie Champagne 06/08/2017

## 2017-06-22 ENCOUNTER — Encounter (HOSPITAL_COMMUNITY): Payer: Self-pay

## 2017-06-22 ENCOUNTER — Emergency Department (HOSPITAL_COMMUNITY): Payer: Medicare HMO

## 2017-06-22 ENCOUNTER — Observation Stay (HOSPITAL_COMMUNITY)
Admission: EM | Admit: 2017-06-22 | Discharge: 2017-06-24 | Disposition: A | Discharge status: Home / Self Care | Payer: Medicare HMO | Attending: Family Medicine | Admitting: Family Medicine

## 2017-06-22 DIAGNOSIS — I272 Pulmonary hypertension, unspecified: Secondary | ICD-10-CM | POA: Diagnosis present

## 2017-06-22 DIAGNOSIS — I959 Hypotension, unspecified: Principal | ICD-10-CM | POA: Diagnosis present

## 2017-06-22 DIAGNOSIS — Z7984 Long term (current) use of oral hypoglycemic drugs: Secondary | ICD-10-CM | POA: Insufficient documentation

## 2017-06-22 DIAGNOSIS — E860 Dehydration: Secondary | ICD-10-CM | POA: Diagnosis not present

## 2017-06-22 DIAGNOSIS — D631 Anemia in chronic kidney disease: Secondary | ICD-10-CM | POA: Insufficient documentation

## 2017-06-22 DIAGNOSIS — N1832 Chronic kidney disease, stage 3b: Secondary | ICD-10-CM | POA: Diagnosis present

## 2017-06-22 DIAGNOSIS — E118 Type 2 diabetes mellitus with unspecified complications: Secondary | ICD-10-CM | POA: Diagnosis present

## 2017-06-22 DIAGNOSIS — Z9981 Dependence on supplemental oxygen: Secondary | ICD-10-CM | POA: Insufficient documentation

## 2017-06-22 DIAGNOSIS — G9341 Metabolic encephalopathy: Secondary | ICD-10-CM | POA: Diagnosis present

## 2017-06-22 DIAGNOSIS — E1129 Type 2 diabetes mellitus with other diabetic kidney complication: Secondary | ICD-10-CM | POA: Diagnosis present

## 2017-06-22 DIAGNOSIS — E1122 Type 2 diabetes mellitus with diabetic chronic kidney disease: Secondary | ICD-10-CM | POA: Diagnosis present

## 2017-06-22 DIAGNOSIS — I2729 Other secondary pulmonary hypertension: Secondary | ICD-10-CM | POA: Diagnosis not present

## 2017-06-22 DIAGNOSIS — I5032 Chronic diastolic (congestive) heart failure: Secondary | ICD-10-CM | POA: Diagnosis present

## 2017-06-22 DIAGNOSIS — I130 Hypertensive heart and chronic kidney disease with heart failure and stage 1 through stage 4 chronic kidney disease, or unspecified chronic kidney disease: Secondary | ICD-10-CM | POA: Insufficient documentation

## 2017-06-22 DIAGNOSIS — K219 Gastro-esophageal reflux disease without esophagitis: Secondary | ICD-10-CM | POA: Diagnosis present

## 2017-06-22 DIAGNOSIS — J449 Chronic obstructive pulmonary disease, unspecified: Secondary | ICD-10-CM | POA: Diagnosis present

## 2017-06-22 DIAGNOSIS — Z79899 Other long term (current) drug therapy: Secondary | ICD-10-CM | POA: Insufficient documentation

## 2017-06-22 DIAGNOSIS — Z8673 Personal history of transient ischemic attack (TIA), and cerebral infarction without residual deficits: Secondary | ICD-10-CM | POA: Insufficient documentation

## 2017-06-22 DIAGNOSIS — I5082 Biventricular heart failure: Secondary | ICD-10-CM | POA: Insufficient documentation

## 2017-06-22 DIAGNOSIS — N179 Acute kidney failure, unspecified: Secondary | ICD-10-CM | POA: Diagnosis not present

## 2017-06-22 DIAGNOSIS — G4730 Sleep apnea, unspecified: Secondary | ICD-10-CM | POA: Insufficient documentation

## 2017-06-22 DIAGNOSIS — E785 Hyperlipidemia, unspecified: Secondary | ICD-10-CM | POA: Diagnosis present

## 2017-06-22 DIAGNOSIS — N183 Chronic kidney disease, stage 3 (moderate): Secondary | ICD-10-CM | POA: Diagnosis not present

## 2017-06-22 DIAGNOSIS — R531 Weakness: Secondary | ICD-10-CM | POA: Diagnosis present

## 2017-06-22 DIAGNOSIS — D638 Anemia in other chronic diseases classified elsewhere: Secondary | ICD-10-CM | POA: Diagnosis present

## 2017-06-22 DIAGNOSIS — Z87891 Personal history of nicotine dependence: Secondary | ICD-10-CM | POA: Diagnosis not present

## 2017-06-22 DIAGNOSIS — Z7982 Long term (current) use of aspirin: Secondary | ICD-10-CM | POA: Insufficient documentation

## 2017-06-22 DIAGNOSIS — M109 Gout, unspecified: Secondary | ICD-10-CM | POA: Diagnosis present

## 2017-06-22 DIAGNOSIS — I10 Essential (primary) hypertension: Secondary | ICD-10-CM | POA: Diagnosis present

## 2017-06-22 DIAGNOSIS — G9340 Encephalopathy, unspecified: Secondary | ICD-10-CM | POA: Diagnosis present

## 2017-06-22 DIAGNOSIS — R4781 Slurred speech: Secondary | ICD-10-CM | POA: Diagnosis present

## 2017-06-22 DIAGNOSIS — N189 Chronic kidney disease, unspecified: Secondary | ICD-10-CM | POA: Diagnosis present

## 2017-06-22 HISTORY — DX: Gout, unspecified: M10.9

## 2017-06-22 HISTORY — DX: Chronic kidney disease, unspecified: N18.9

## 2017-06-22 HISTORY — DX: Heart failure, unspecified: I50.9

## 2017-06-22 LAB — COMPREHENSIVE METABOLIC PANEL
ALBUMIN: 3.4 g/dL — AB (ref 3.5–5.0)
ALT: 18 U/L (ref 14–54)
AST: 24 U/L (ref 15–41)
Alkaline Phosphatase: 38 U/L (ref 38–126)
Anion gap: 11 (ref 5–15)
BILIRUBIN TOTAL: 0.6 mg/dL (ref 0.3–1.2)
BUN: 39 mg/dL — AB (ref 6–20)
CHLORIDE: 98 mmol/L — AB (ref 101–111)
CO2: 32 mmol/L (ref 22–32)
Calcium: 9.2 mg/dL (ref 8.9–10.3)
Creatinine, Ser: 2 mg/dL — ABNORMAL HIGH (ref 0.44–1.00)
GFR calc Af Amer: 27 mL/min — ABNORMAL LOW (ref >60)
GFR calc non Af Amer: 23 mL/min — ABNORMAL LOW (ref >60)
GLUCOSE: 179 mg/dL — AB (ref 65–99)
POTASSIUM: 4.7 mmol/L (ref 3.5–5.1)
SODIUM: 141 mmol/L (ref 135–145)
TOTAL PROTEIN: 5.9 g/dL — AB (ref 6.5–8.1)

## 2017-06-22 LAB — I-STAT CHEM 8, ED
BUN: 40 mg/dL — AB (ref 6–20)
Calcium, Ion: 1.11 mmol/L — ABNORMAL LOW (ref 1.15–1.40)
Chloride: 98 mmol/L — ABNORMAL LOW (ref 101–111)
Creatinine, Ser: 2.1 mg/dL — ABNORMAL HIGH (ref 0.44–1.00)
Glucose, Bld: 172 mg/dL — ABNORMAL HIGH (ref 65–99)
HEMATOCRIT: 32 % — AB (ref 36.0–46.0)
Hemoglobin: 10.9 g/dL — ABNORMAL LOW (ref 12.0–15.0)
POTASSIUM: 4.5 mmol/L (ref 3.5–5.1)
SODIUM: 139 mmol/L (ref 135–145)
TCO2: 32 mmol/L (ref 0–100)

## 2017-06-22 LAB — DIFFERENTIAL
BASOS ABS: 0 10*3/uL (ref 0.0–0.1)
BASOS PCT: 0 %
EOS ABS: 0.2 10*3/uL (ref 0.0–0.7)
EOS PCT: 3 %
Lymphocytes Relative: 10 %
Lymphs Abs: 0.7 10*3/uL (ref 0.7–4.0)
Monocytes Absolute: 0.5 10*3/uL (ref 0.1–1.0)
Monocytes Relative: 8 %
NEUTROS PCT: 79 %
Neutro Abs: 5.6 10*3/uL (ref 1.7–7.7)

## 2017-06-22 LAB — CBC
HCT: 32 % — ABNORMAL LOW (ref 36.0–46.0)
Hemoglobin: 9.6 g/dL — ABNORMAL LOW (ref 12.0–15.0)
MCH: 28.8 pg (ref 26.0–34.0)
MCHC: 30 g/dL (ref 30.0–36.0)
MCV: 96.1 fL (ref 78.0–100.0)
PLATELETS: 210 10*3/uL (ref 150–400)
RBC: 3.33 MIL/uL — ABNORMAL LOW (ref 3.87–5.11)
RDW: 16.2 % — AB (ref 11.5–15.5)
WBC: 7 10*3/uL (ref 4.0–10.5)

## 2017-06-22 LAB — PROTIME-INR
INR: 0.92
PROTHROMBIN TIME: 12.4 s (ref 11.4–15.2)

## 2017-06-22 LAB — I-STAT TROPONIN, ED: Troponin i, poc: 0.01 ng/mL (ref 0.00–0.08)

## 2017-06-22 LAB — APTT: aPTT: 27 seconds (ref 24–36)

## 2017-06-22 MED ORDER — SODIUM CHLORIDE 0.9 % IV BOLUS (SEPSIS): 500.0000 mL | Freq: Once | INTRAVENOUS | Status: DC

## 2017-06-22 MED ORDER — SODIUM CHLORIDE 0.9 % IV BOLUS (SEPSIS)
500.0000 mL | Freq: Once | INTRAVENOUS | Status: AC
  Administered 2017-06-22: 500 mL via INTRAVENOUS

## 2017-06-22 NOTE — ED Triage Notes (Signed)
Pt brought in by family for complaint of increased confusion as well as slurred speech that began yesterday. Speech noted to be clear in triage and patient alert and oriented. Pt noted to be hypotensive in triage.

## 2017-06-23 ENCOUNTER — Encounter (HOSPITAL_COMMUNITY): Payer: Self-pay | Admitting: General Practice

## 2017-06-23 ENCOUNTER — Observation Stay (HOSPITAL_COMMUNITY): Payer: Medicare HMO

## 2017-06-23 DIAGNOSIS — E785 Hyperlipidemia, unspecified: Secondary | ICD-10-CM

## 2017-06-23 DIAGNOSIS — G9341 Metabolic encephalopathy: Secondary | ICD-10-CM | POA: Diagnosis not present

## 2017-06-23 DIAGNOSIS — N179 Acute kidney failure, unspecified: Secondary | ICD-10-CM | POA: Diagnosis present

## 2017-06-23 DIAGNOSIS — I10 Essential (primary) hypertension: Secondary | ICD-10-CM

## 2017-06-23 DIAGNOSIS — K219 Gastro-esophageal reflux disease without esophagitis: Secondary | ICD-10-CM

## 2017-06-23 DIAGNOSIS — I959 Hypotension, unspecified: Secondary | ICD-10-CM | POA: Diagnosis not present

## 2017-06-23 DIAGNOSIS — I5032 Chronic diastolic (congestive) heart failure: Secondary | ICD-10-CM | POA: Diagnosis not present

## 2017-06-23 DIAGNOSIS — R4781 Slurred speech: Secondary | ICD-10-CM | POA: Diagnosis not present

## 2017-06-23 DIAGNOSIS — G9340 Encephalopathy, unspecified: Secondary | ICD-10-CM

## 2017-06-23 DIAGNOSIS — M109 Gout, unspecified: Secondary | ICD-10-CM | POA: Diagnosis present

## 2017-06-23 DIAGNOSIS — J449 Chronic obstructive pulmonary disease, unspecified: Secondary | ICD-10-CM | POA: Diagnosis not present

## 2017-06-23 DIAGNOSIS — N183 Chronic kidney disease, stage 3 (moderate): Secondary | ICD-10-CM | POA: Diagnosis not present

## 2017-06-23 DIAGNOSIS — N189 Chronic kidney disease, unspecified: Secondary | ICD-10-CM | POA: Diagnosis present

## 2017-06-23 LAB — BASIC METABOLIC PANEL
Anion gap: 8 (ref 5–15)
BUN: 37 mg/dL — AB (ref 6–20)
CALCIUM: 8.8 mg/dL — AB (ref 8.9–10.3)
CO2: 31 mmol/L (ref 22–32)
CREATININE: 1.38 mg/dL — AB (ref 0.44–1.00)
Chloride: 102 mmol/L (ref 101–111)
GFR calc non Af Amer: 36 mL/min — ABNORMAL LOW (ref >60)
GFR, EST AFRICAN AMERICAN: 42 mL/min — AB (ref >60)
Glucose, Bld: 128 mg/dL — ABNORMAL HIGH (ref 65–99)
Potassium: 4.6 mmol/L (ref 3.5–5.1)
SODIUM: 141 mmol/L (ref 135–145)

## 2017-06-23 LAB — URINALYSIS, ROUTINE W REFLEX MICROSCOPIC
BILIRUBIN URINE: NEGATIVE
Bacteria, UA: NONE SEEN
Glucose, UA: NEGATIVE mg/dL
Hgb urine dipstick: NEGATIVE
Ketones, ur: NEGATIVE mg/dL
Nitrite: NEGATIVE
PH: 5 (ref 5.0–8.0)
Protein, ur: NEGATIVE mg/dL
SPECIFIC GRAVITY, URINE: 1.014 (ref 1.005–1.030)

## 2017-06-23 LAB — CBC
HCT: 29.7 % — ABNORMAL LOW (ref 36.0–46.0)
Hemoglobin: 9.1 g/dL — ABNORMAL LOW (ref 12.0–15.0)
MCH: 29.6 pg (ref 26.0–34.0)
MCHC: 30.6 g/dL (ref 30.0–36.0)
MCV: 96.7 fL (ref 78.0–100.0)
PLATELETS: 196 10*3/uL (ref 150–400)
RBC: 3.07 MIL/uL — AB (ref 3.87–5.11)
RDW: 16.5 % — AB (ref 11.5–15.5)
WBC: 6.2 10*3/uL (ref 4.0–10.5)

## 2017-06-23 LAB — BRAIN NATRIURETIC PEPTIDE: B NATRIURETIC PEPTIDE 5: 16.4 pg/mL (ref 0.0–100.0)

## 2017-06-23 LAB — GLUCOSE, CAPILLARY
Glucose-Capillary: 103 mg/dL — ABNORMAL HIGH (ref 65–99)
Glucose-Capillary: 117 mg/dL — ABNORMAL HIGH (ref 65–99)

## 2017-06-23 LAB — CORTISOL-AM, BLOOD: CORTISOL - AM: 8.6 ug/dL (ref 6.7–22.6)

## 2017-06-23 LAB — CBG MONITORING, ED
GLUCOSE-CAPILLARY: 121 mg/dL — AB (ref 65–99)
GLUCOSE-CAPILLARY: 128 mg/dL — AB (ref 65–99)
Glucose-Capillary: 124 mg/dL — ABNORMAL HIGH (ref 65–99)

## 2017-06-23 LAB — CREATININE, URINE, RANDOM: Creatinine, Urine: 183.86 mg/dL

## 2017-06-23 MED ORDER — ATORVASTATIN CALCIUM 40 MG PO TABS
40.0000 mg | ORAL_TABLET | Freq: Every day | ORAL | Status: DC
  Administered 2017-06-23: 40 mg via ORAL
  Filled 2017-06-23 (×2): qty 1

## 2017-06-23 MED ORDER — ALLOPURINOL 100 MG PO TABS
300.0000 mg | ORAL_TABLET | Freq: Every day | ORAL | Status: DC
  Administered 2017-06-23 – 2017-06-24 (×2): 300 mg via ORAL
  Filled 2017-06-23: qty 3
  Filled 2017-06-23 (×2): qty 1

## 2017-06-23 MED ORDER — LORAZEPAM 2 MG/ML IJ SOLN
1.0000 mg | Freq: Once | INTRAMUSCULAR | Status: AC
  Administered 2017-06-23: 1 mg via INTRAVENOUS
  Filled 2017-06-23: qty 1

## 2017-06-23 MED ORDER — COENZYME Q10 30 MG PO CAPS: 30.0000 mg | ORAL_CAPSULE | Freq: Every day | ORAL | Status: DC

## 2017-06-23 MED ORDER — ACETAMINOPHEN 650 MG RE SUPP: 650.0000 mg | Freq: Four times a day (QID) | RECTAL | Status: DC | PRN

## 2017-06-23 MED ORDER — INSULIN ASPART 100 UNIT/ML ~~LOC~~ SOLN
0.0000 [IU] | Freq: Three times a day (TID) | SUBCUTANEOUS | Status: DC
  Administered 2017-06-23 (×2): 1 [IU] via SUBCUTANEOUS
  Administered 2017-06-24: 2 [IU] via SUBCUTANEOUS
  Filled 2017-06-23 (×2): qty 1

## 2017-06-23 MED ORDER — LEVALBUTEROL HCL 1.25 MG/0.5ML IN NEBU
1.2500 mg | INHALATION_SOLUTION | Freq: Four times a day (QID) | RESPIRATORY_TRACT | Status: DC
  Administered 2017-06-23 – 2017-06-24 (×6): 1.25 mg via RESPIRATORY_TRACT
  Filled 2017-06-23 (×10): qty 0.5

## 2017-06-23 MED ORDER — ENOXAPARIN SODIUM 40 MG/0.4ML ~~LOC~~ SOLN
40.0000 mg | Freq: Every day | SUBCUTANEOUS | Status: DC
  Administered 2017-06-23 – 2017-06-24 (×2): 40 mg via SUBCUTANEOUS
  Filled 2017-06-23 (×3): qty 0.4

## 2017-06-23 MED ORDER — OMEGA-3-ACID ETHYL ESTERS 1 G PO CAPS
1.0000 g | ORAL_CAPSULE | Freq: Every day | ORAL | Status: DC
  Administered 2017-06-23 – 2017-06-24 (×2): 1 g via ORAL
  Filled 2017-06-23 (×3): qty 1

## 2017-06-23 MED ORDER — INSULIN ASPART 100 UNIT/ML ~~LOC~~ SOLN: 0.0000 [IU] | Freq: Every day | SUBCUTANEOUS | Status: DC

## 2017-06-23 MED ORDER — MOMETASONE FURO-FORMOTEROL FUM 200-5 MCG/ACT IN AERO
2.0000 | INHALATION_SPRAY | Freq: Two times a day (BID) | RESPIRATORY_TRACT | Status: DC
  Administered 2017-06-23 – 2017-06-24 (×2): 2 via RESPIRATORY_TRACT
  Filled 2017-06-23 (×2): qty 8.8

## 2017-06-23 MED ORDER — ZOLPIDEM TARTRATE 5 MG PO TABS: 5.0000 mg | ORAL_TABLET | Freq: Every evening | ORAL | Status: DC | PRN

## 2017-06-23 MED ORDER — SODIUM CHLORIDE 0.9 % IV SOLN
INTRAVENOUS | Status: DC
  Administered 2017-06-23 (×2): via INTRAVENOUS

## 2017-06-23 MED ORDER — ONDANSETRON HCL 4 MG/2ML IJ SOLN: 4.0000 mg | Freq: Four times a day (QID) | INTRAMUSCULAR | Status: DC | PRN

## 2017-06-23 MED ORDER — PANTOPRAZOLE SODIUM 40 MG PO TBEC
40.0000 mg | DELAYED_RELEASE_TABLET | Freq: Every day | ORAL | Status: DC
  Administered 2017-06-23 – 2017-06-24 (×2): 40 mg via ORAL
  Filled 2017-06-23 (×2): qty 1

## 2017-06-23 MED ORDER — ONDANSETRON HCL 4 MG PO TABS: 4.0000 mg | ORAL_TABLET | Freq: Four times a day (QID) | ORAL | Status: DC | PRN

## 2017-06-23 MED ORDER — ACETAMINOPHEN 325 MG PO TABS: 650.0000 mg | ORAL_TABLET | Freq: Four times a day (QID) | ORAL | Status: DC | PRN

## 2017-06-23 MED ORDER — ASPIRIN EC 81 MG PO TBEC
81.0000 mg | DELAYED_RELEASE_TABLET | Freq: Every day | ORAL | Status: DC
  Administered 2017-06-23 – 2017-06-24 (×2): 81 mg via ORAL
  Filled 2017-06-23 (×2): qty 1

## 2017-06-23 NOTE — Progress Notes (Signed)
PHARMACIST - PHYSICIAN ORDER COMMUNICATION  CONCERNING: P&T Medication Policy on Herbal Medications  DESCRIPTION:  This patient's order for:  Co Enzyme Q10  has been noted.  This product(s) is classified as an "herbal" or natural product. Due to a lack of definitive safety studies or FDA approval, nonstandard manufacturing practices, plus the potential risk of unknown drug-drug interactions while on inpatient medications, the Pharmacy and Therapeutics Committee does not permit the use of "herbal" or natural products of this type within [facility].   ACTION TAKEN: The pharmacy department is unable to verify this order at this time and your patient has been informed of this safety policy. Please reevaluate patient's clinical condition at discharge and address if the herbal or natural product(s) should be resumed at that time.

## 2017-06-23 NOTE — ED Notes (Signed)
cbg was 128

## 2017-06-23 NOTE — Progress Notes (Signed)
ED report received from pt at 1810 and pt arrived to the unit at 1835. Pt alert and verbally responsive; IV intact and transfusing; telemetry applied and verified with CCMD; NT called to second verify. Pt skin dry and intact with no opened wounds or pressure ulcer noted except for old rash all over body which pt daughter report it's a reaction to some medication pt took. Pt and family oriented to the unit and room; NIH 0; fall/safety precaution and prevention education completed with pt and family; all voices understanding and denies any questions. Pt in bed with call light within reach. Will closely monitor pt. Delia Heady RN

## 2017-06-23 NOTE — ED Notes (Signed)
Pharmacy notified of missing dose Dulera inhaler

## 2017-06-23 NOTE — Progress Notes (Signed)
[facility]   Patient admitted after midnight see H&P for further details  Patient seen and examined and chart reviewed.  76 year old female with medical history significant for hypertension, hyperlipidemia, DM, COPD, asthma, pulmonary hypertension and chronic kidney disease presented to the emergency department with confusion and slurred speech. Stroke workup has been negative so far. She was admitted after she was found to be hypotensive.   I agree with initiatial management and plan.  Chipper Oman, MD

## 2017-06-23 NOTE — ED Notes (Signed)
Pharmacy has been notified about past due medications

## 2017-06-23 NOTE — ED Notes (Signed)
Pt assisted up to bedside commode.

## 2017-06-23 NOTE — H&P (Signed)
History and Physical    [name] RCV:[id number] DOB: 1941/06/12 DOA: 06/22/2017  Referring MD/NP/PA:   PCP: Beth Blade, MD   Patient coming from:  The patient is coming from home.  At baseline, pt is independent for most of ADL.  Chief Complaint: Confusion, slurred speech  HPI: Beth [name] is a 76 y.o. female with medical history significant of hypertension, hyperlipidemia, diabetes mellitus, COPD, asthma, stroke, GERD, gout, dCHF, pulmonary hypertension, CKD-3, who presents with confusion and slurred speech.  Per patient's daughter, patient became confused last night. She was noted to have mild slurred speech, but no unilateral weakness, numbness or tingling to extremities. No vision change or hearing loss. Patient did not have chest pain or tenderness in the area. Patient has chronic shortness of breath and cough which have not changed. No nausea, vomiting, diarrhea, abdominal pain, symptoms of UTI. At arrival to ED, all her symptoms have resolved. When I saw pt in ED, she is oriented 3. No slurred speech or unilateral weakness. Denies any chest pain, fever or chills. She states that nothing is bothering her now. Patient was found to have hypotension with blood pressure 79/42 which improved to 126/46 after 500 mL normal saline bolus in ED.   Of note, pt has severe pulmonary hypertension. Her cardiologist, Dr. Aundra Dubin has adjusted her medications got pulmonary hypertension recently. Per Dr. Claris Gladden clinic note, pt was recently transitioned from sildenafil to Riociguat and she is also on Macitetan.    ED Course: pt was found to have  WBC 7.0, INR 0.92, PTT 27, troponin negative, worsening renal function, temperature normal, tachycardia, tachypnea, O2 sat 92-99% on nasal cannula oxygen. Chest x-ray showed vascular congestion, CT head is negative for acute intracranial abnormalities. Patient is placed on telemetry bed for observation.   Review of Systems:   General: no fevers,  chills, no changes in body weight, has fatigue HEENT: no blurry vision, hearing changes or sore throat Respiratory: has dyspnea, coughing, no wheezing CV: no chest pain, no palpitations GI: no nausea, vomiting, abdominal pain, diarrhea, constipation GU: no dysuria, burning on urination, increased urinary frequency, hematuria  Ext: no leg edema Neuro: no unilateral weakness, numbness, or tingling, no vision change or hearing loss. Had slurred speech and confusion. Skin: no rash, no skin tear. MSK: No muscle spasm, no deformity, no limitation of range of movement in spin Heme: No easy bruising.  Travel history: No recent long distant travel.  Allergy:  Allergies  Allergen Reactions  . Aldomet [Methyldopa] Hives  . Tyvaso [Treprostinil] Diarrhea    Past Medical History:  Diagnosis Date  . Arthritis   . Asthma   . COPD (chronic obstructive pulmonary disease) ([place])    dR. wERT  . 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 Hyperlipidemia   . Hypertension   . Shortness of breath    WITH EXERTION   . Sleep apnea    USES  CPAP   . Stroke [facility])     Past Surgical History:  Procedure Laterality Date  . CARDIAC CATHETERIZATION N/A 04/02/2016   Procedure: Right Heart Cath;  Surgeon: Larey Dresser, MD;  Location: [place] [facility];  Service: Cardiovascular;  Laterality: N/A;  . CATARACT EXTRACTION W/PHACO Right 04/19/2013   Procedure: CATARACT EXTRACTION PHACO AND INTRAOCULAR LENS PLACEMENT (IOC);  Surgeon: Adonis Brook, MD;  Location: [place];  Service: Ophthalmology;  Laterality: Right;  . CATARACT EXTRACTION W/PHACO Left 05/03/2013   Procedure: CATARACT EXTRACTION PHACO AND INTRAOCULAR LENS  PLACEMENT (IOC);  Surgeon: Adonis Brook, MD;  Location: [place];  Service: Ophthalmology;  Laterality: Left;  . EYE SURGERY    . TUBAL LIGATION      Social History:  reports that she quit smoking about 12 years ago. Her smoking use included Cigarettes. She has a 60.00 pack-year smoking history. She has  quit using smokeless tobacco. She reports that she does not drink alcohol or use drugs.  Family History:  Family History  Problem Relation Age of Onset  . Heart disease Mother   . Heart failure Mother   . Heart disease Brother   . Heart disease Brother   . Heart disease Sister   . Heart disease Sister   . Heart failure Father      Prior to Admission medications   Medication Sig Start Date End Date Taking? Authorizing Provider  allopurinol (ZYLOPRIM) 300 MG tablet Take 300 mg by mouth daily.   Yes [provider]  ASPIRIN ADULT LOW STRENGTH 81 MG EC tablet TAKE 1 TABLET BY MOUTH EVERY DAY 06/08/17  Yes Bensimhon, Shaune Pascal, MD  atorvastatin (LIPITOR) 40 MG tablet Take 40 mg by mouth daily at 6 PM.   Yes [provider]  budesonide-formoterol (SYMBICORT) 160-4.5 MCG/ACT inhaler INHALE 2 PUFFS FIRST THING IN THE MORNING AND THEN ANOTHER 2 PUFFS ABOUT 12 HOURS LATER 04/03/14  Yes Tanda Rockers, MD  co-enzyme Q-10 30 MG capsule Take 30 mg by mouth daily.   Yes [provider]  furosemide (LASIX) 40 MG tablet Take 0.5-1 tablets (20-40 mg total) by mouth as directed. Take 40 mg in the AM and 20 mg in the PM 11/13/16  Yes Larey Dresser, MD  Glycopyrrolate-Formoterol (BEVESPI AEROSPHERE) 9-4.8 MCG/ACT AERO Inhale 2 puffs into the lungs daily.   Yes [provider]  Macitentan (OPSUMIT) 10 MG TABS Take 1 tablet (10 mg total) by mouth daily. 12/14/16  Yes Larey Dresser, MD  meloxicam (MOBIC) 15 MG tablet Take 15 mg by mouth daily as needed for pain.   Yes [provider]  metFORMIN (GLUCOPHAGE) 500 MG tablet Take 500 mg by mouth 2 (two) times daily with a meal.   Yes [provider]  mometasone-formoterol (DULERA) 200-5 MCG/ACT AERO Inhale 2 puffs into the lungs 2 (two) times daily.   Yes [provider]  Omega 3 1000 MG CAPS Take 1 capsule by mouth daily.   Yes [provider]  omeprazole (PRILOSEC) 40 MG capsule Take 40 mg  by mouth daily.   Yes [provider]  Potassium Chloride ER 20 MEQ TBCR Take 20 mEq by mouth daily. 03/11/17  Yes Bensimhon, Shaune Pascal, MD  Riociguat (ADEMPAS) 0.5 MG TABS Take 1 tablet by mouth 3 (three) times daily.   Yes [provider]  valsartan-hydrochlorothiazide (DIOVAN-HCT) 160-25 MG tablet Take 1 tablet by mouth daily.   Yes [provider]  albuterol (PROAIR HFA) 108 (90 BASE) MCG/ACT inhaler 2 puffs every 4 hours as needed only  if your can't catch your breath Patient taking differently: Inhale 1 puff into the lungs every 6 (six) hours as needed. 2 puffs every 4 hours as needed only  if your can't catch your breath 04/03/14   Tanda Rockers, MD  predniSONE (DELTASONE) 20 MG tablet Take 2 tabs daily for 2 days then 1 tab daily for 3 days Patient not taking: Reported on 06/22/2017 06/07/17   Larey Dresser, MD    Physical Exam: Vitals:  06/22/17 2300 06/22/17 2330 06/23/17 0000 06/23/17 0030  BP: (!) 109/54 (!) 115/59 (!) 109/50 (!) 126/46  Pulse: 97 95 97 100  Resp: (!) 21 (!) _0 Temp:      TempSrc:      SpO2: 100% 92% 93% 99%   General: Not in acute distress. Dry mucus and membrane HEENT:       Eyes: PERRL, EOMI, no scleral icterus.       ENT: No discharge from the ears and nose, no pharynx injection, no tonsillar enlargement.        Neck: No JVD, no bruit, no mass felt. Heme: No neck lymph node enlargement. Cardiac: S1/S2, RRR, No murmurs, No gallops or rubs. Respiratory: No rales, wheezing, rhonchi or rubs. GI: Soft, nondistended, nontender, no rebound pain, no organomegaly, BS present. GU: No hematuria Ext: No pitting leg edema bilaterally. 2+DP/PT pulse bilaterally. Musculoskeletal: No joint deformities, No joint redness or warmth, no limitation of ROM in spin. Skin: No rashes.  Neuro: Alert, oriented X3, cranial nerves II-XII grossly intact, moves all extremities normally. Muscle strength 5/5 in all extremities, sensation to light  touch intact. Brachial reflex 2+ bilaterally. Negative Babinski's sign. Normal finger to nose test. Psych: Patient is not psychotic, no suicidal or hemocidal ideation.  Labs on Admission: I have personally reviewed following labs and imaging studies  CBC:  Recent Labs Lab 06/22/17 1624 06/22/17 1636 06/23/17 0159  WBC 7.0  --  6.2  NEUTROABS 5.6  --   --   HGB 9.6* 10.9* 9.1*  HCT 32.0* 32.0* 29.7*  MCV 96.1  --  96.7  PLT 210  --  301   Basic Metabolic Panel:  Recent Labs Lab 06/22/17 1624 06/22/17 1636 06/23/17 0159  NA 141 139 141  K 4.7 4.5 4.6  CL 98* 98* 102  CO2 32  --  31  GLUCOSE 179* 172* 128*  BUN 39* 40* 37*  CREATININE 2.00* 2.10* 1.38*  CALCIUM 9.2  --  8.8*   GFR: CrCl cannot be calculated (Unknown ideal weight.). Liver Function Tests:  Recent Labs Lab 06/22/17 1624  AST 24  ALT 18  ALKPHOS 38  BILITOT 0.6  PROT 5.9*  ALBUMIN 3.4*   No results for input(s): LIPASE, AMYLASE in the last 168 hours. No results for input(s): AMMONIA in the last 168 hours. Coagulation Profile:  Recent Labs Lab 06/22/17 1624  INR 0.92   Cardiac Enzymes: No results for input(s): CKTOTAL, CKMB, CKMBINDEX, TROPONINI in the last 168 hours. BNP (last 3 results) No results for input(s): PROBNP in the last 8760 hours. HbA1C: No results for input(s): HGBA1C in the last 72 hours. CBG:  Recent Labs Lab 06/23/17 0142  GLUCAP 121*   Lipid Profile: No results for input(s): CHOL, HDL, LDLCALC, TRIG, CHOLHDL, LDLDIRECT in the last 72 hours. Thyroid Function Tests: No results for input(s): TSH, T4TOTAL, FREET4, T3FREE, THYROIDAB in the last 72 hours. Anemia Panel: No results for input(s): VITAMINB12, FOLATE, FERRITIN, TIBC, IRON, RETICCTPCT in the last 72 hours. Urine analysis:    Component Value Date/Time   COLORURINE YELLOW 06/23/2017 0120   APPEARANCEUR CLEAR 06/23/2017 0120   LABSPEC 1.014 06/23/2017 0120   PHURINE 5.0 06/23/2017 0120   GLUCOSEU NEGATIVE  06/23/2017 0120   HGBUR NEGATIVE 06/23/2017 0120   BILIRUBINUR NEGATIVE 06/23/2017 0120   KETONESUR NEGATIVE 06/23/2017 0120   PROTEINUR NEGATIVE 06/23/2017 0120   NITRITE NEGATIVE 06/23/2017 0120   LEUKOCYTESUR TRACE (A) 06/23/2017 0120   Sepsis Labs: @  LABRCNTIP(procalcitonin:4,lacticidven:4) )No results found for this or any previous visit (from the past 240 hour(s)).   Radiological Exams on Admission: Dg Chest 2 View  Result Date: 06/22/2017 CLINICAL DATA:  Chronic shortness of breath.  Initial encounter. EXAM: CHEST  2 VIEW COMPARISON:  Chest radiograph performed 06/07/2017 FINDINGS: The lungs are well-aerated. Vascular congestion is noted. There is no evidence of focal opacification, pleural effusion or pneumothorax. The heart is borderline enlarged. No acute osseous abnormalities are seen. IMPRESSION: Vascular congestion and borderline cardiomegaly. Lungs remain grossly clear. Electronically Signed   By: Garald Balding M.D.   On: 06/22/2017 21:42   Ct Head Wo Contrast  Result Date: 06/22/2017 CLINICAL DATA:  76 year old diabetic hypertensive female with altered mental status with increased confusion and slurred speech onset yesterday. Initial encounter. EXAM: CT HEAD WITHOUT CONTRAST TECHNIQUE: Contiguous axial images were obtained from the base of the skull through the vertex without intravenous contrast. COMPARISON:  None. FINDINGS: Brain: No intracranial hemorrhage or CT evidence of large acute infarct. Mild global atrophy without hydrocephalus. No intracranial mass lesion noted on this unenhanced exam. Vascular: Vascular calcifications. Skull: Negative. Sinuses/Orbits: Mild exophthalmos post lens replacement. Partial opacification left ethmoid air cell. Other: Mastoid air cells and middle ear cavities are clear. IMPRESSION: No intracranial hemorrhage or CT evidence of large acute infarct. Mild atrophy. Mild exophthalmos. Opacification single left ethmoid sinus air cell. Vascular  calcifications. Electronically Signed   By: Genia Del M.D.   On: 06/22/2017 17:38     EKG: Independently reviewed. Sinus rhythm, QTC 480, right bundle blockage, right axis deviation    Assessment/Plan Principal Problem:   Acute encephalopathy Active Problems:   COPD GOLD III   Diabetes mellitus with renal complications (HCC)   Chronic diastolic CHF (congestive heart failure) (HCC)   Essential hypertension   Dyslipidemia   Pulmonary hypertension (HCC)   Anemia   Hypotension   Slurred speech   Acute renal failure superimposed on stage 3 chronic kidney disease (HCC)   Acute metabolic encephalopathy   GERD (gastroesophageal reflux disease)   Gout   Acute metabolic encephalopathy and slurred speech: Etiology is not clear. CT head is negative for acute intracranial abnormalities. Patient had one episode of mild slurred speech, which has resolved. Patient does not have focal neurological findings on physical examination. Her slurred speech is in the setting of confusion, low suspicions for stroke. Her hypotension may have partially contributed to her AMS. Patient is clinically dry, possibly due to overdiuresis in the setting of multiple blood pressure lowering medications. Now patient is asymptomatic. Blood pressure is stabilized.  -Will place on telemetry bed for observation -Frequent neurologic checks  Hypotension: Blood pressure 79/42, which quickly responded to IV fluid, improved to 126/46. Most likely due to multiple blood pressure lowering medications, diuretics and  medications for pulmonary hypertension. Card, Dr. Radford Pax was consulted, recommended to hold pulmonary hypertension medications, get congestive heart failure team consultation in AM. --hold Valsartan/HCTZ, Lasix, Macitentan and Riociguat --IVF: 500 cc NS, then 75 cc/h -check cortisol level  COPD GOLD III: stable -Dulera inhaler -prn xopenex nebs  Diabetes mellitus with renal complications:  Last A2N 11.2 on  03/18/16, poorly controled. Patient is taking metformin at home -SSI  Chronic diastolic CHF (congestive heart failure) ([place]): 2-D echo on 06/07/17 showed EF 60-65%. Previously 2-D echo on 03/28/16 showed EF of 55% with grade 1 diastolic dysfunction. Patient is clinically dry. No leg edema JVD. -Hold Lasix -Continue aspirin -Recheck BNP  HTN: Presents with hypotension -Hold blood  pressure medications and Lasix  HLD: -Lipitor  Pulmonary hypertension ([place]); -hold home meds as above  GERD: -Protonix  Anemia: Hemoglobin dropped from 14.1 on 04/02/16-->10.9 today. Patient denies rectal bleeding. -Check FOBT -f/u by CBC  AoCKD-III: Baseline Cre is 1.0-1.4, pt's Cre is 2.00 and BUN 39 on admission. Likely due to prerenal secondary to dehydration and continuation of ARB, diuretics, NSAIDs. ATN is also possible due to hypotension. - IVF as above - Check FeUrea - Follow up renal function by BMP - Hold Mobic, Lasix, valsartan/HCTZ  Gout: -continue home allopurinol   DVT ppx: SQ Lovenox Code Status: Full code Family Communication: Yes, patient's daughter, at bed side Disposition Plan:  Anticipate discharge back to previous home environment Consults called:  Cardiology, Dr. Radford Pax Admission status: Obs / tele  Date of Service 06/23/2017    Ivor Costa [facility] Pager [phone]  If 7PM-7AM, please contact night-coverage [email or web address] Password [email or web address] 06/23/2017, 6:00 AM

## 2017-06-24 ENCOUNTER — Encounter (HOSPITAL_COMMUNITY): Payer: Self-pay | Admitting: General Practice

## 2017-06-24 DIAGNOSIS — N179 Acute kidney failure, unspecified: Secondary | ICD-10-CM

## 2017-06-24 DIAGNOSIS — I272 Pulmonary hypertension, unspecified: Secondary | ICD-10-CM

## 2017-06-24 DIAGNOSIS — J449 Chronic obstructive pulmonary disease, unspecified: Secondary | ICD-10-CM | POA: Diagnosis not present

## 2017-06-24 DIAGNOSIS — I5032 Chronic diastolic (congestive) heart failure: Secondary | ICD-10-CM | POA: Diagnosis not present

## 2017-06-24 DIAGNOSIS — I959 Hypotension, unspecified: Secondary | ICD-10-CM

## 2017-06-24 DIAGNOSIS — N183 Chronic kidney disease, stage 3 (moderate): Secondary | ICD-10-CM

## 2017-06-24 DIAGNOSIS — G9340 Encephalopathy, unspecified: Secondary | ICD-10-CM | POA: Diagnosis not present

## 2017-06-24 LAB — GLUCOSE, CAPILLARY
GLUCOSE-CAPILLARY: 99 mg/dL (ref 65–99)
Glucose-Capillary: 188 mg/dL — ABNORMAL HIGH (ref 65–99)

## 2017-06-24 LAB — URINE CULTURE

## 2017-06-24 LAB — UREA NITROGEN, URINE: UREA NITROGEN UR: 615 mg/dL

## 2017-06-24 MED ORDER — RIOCIGUAT 0.5 MG PO TABS: 1.0000 | ORAL_TABLET | Freq: Three times a day (TID) | ORAL | Status: DC

## 2017-06-24 MED ORDER — MACITENTAN 10 MG PO TABS: 10.0000 mg | ORAL_TABLET | Freq: Every day | ORAL | Status: DC

## 2017-06-24 NOTE — Progress Notes (Signed)
Pt discharge education and instructions completed with pt and daughter at bedside; both voices understanding and denies any questions. Pt IV and telemetry removed; pt discharge home with daughter to transport her home. Pt transported off unit via wheelchair with belongings and daughter to the side. Delia Heady RN

## 2017-06-24 NOTE — ED Provider Notes (Signed)
[facility] DEPT Provider Note   CSN: [id number] Arrival date & time: 06/22/17  1603     History   Chief Complaint Chief Complaint  Patient presents with  . Aphasia    HPI Beth [name] is a 76 y.o. female.  HPI Patient presents to the emergency department with generalized weakness and confusion over the last week.  The daughter states that the confusion and difficulty speaking started about 2 days ago.  Patient states that she does not feel like she is confused, but does feel generally weak.  The patient states that she does feel like she has lack of energy.  Patient states she did not take any medications prior to route for her symptoms.  She states nothing seems make the condition better or worse. The patient denies chest pain, shortness of breath, headache,blurred vision, neck pain, fever, cough,  numbness, dizziness, anorexia, edema, abdominal pain, nausea, vomiting, diarrhea, rash, back pain, dysuria, hematemesis, bloody stool, near syncope, or syncope. Past Medical History:  Diagnosis Date  . Arthritis   . Asthma   . COPD (chronic obstructive pulmonary disease) ([place])    dR. wERT  . 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 Hyperlipidemia   . Hypertension   . Shortness of breath    WITH EXERTION   . Sleep apnea    USES  CPAP   . Stroke [facility])     Patient Active Problem List   Diagnosis Date Noted  . Hypotension 06/23/2017  . Slurred speech 06/23/2017  . Acute renal failure superimposed on stage 3 chronic kidney disease ([place]) 06/23/2017  . Acute metabolic encephalopathy 96/78/9381  . GERD (gastroesophageal reflux disease) 06/23/2017  . Gout 06/23/2017  . Acute encephalopathy 06/22/2017  . Anemia 06/22/2017  . RVF (right ventricular failure) ([place]) 04/15/2016  . Pulmonary hypertension ([place]) 04/15/2016  . Chronic diastolic CHF (congestive heart failure) ([place]) 03/27/2016  . Essential hypertension 03/27/2016  . Dyslipidemia 03/27/2016  . Unspecified essential  hypertension 11/29/2013  . Diabetes mellitus with renal complications ([place]) 01/75/1025  . COPD GOLD III 05/31/2012    Past Surgical History:  Procedure Laterality Date  . CARDIAC CATHETERIZATION N/A 04/02/2016   Procedure: Right Heart Cath;  Surgeon: Larey Dresser, MD;  Location: [place] CV LAB;  Service: Cardiovascular;  Laterality: N/A;  . CATARACT EXTRACTION W/PHACO Right 04/19/2013   Procedure: CATARACT EXTRACTION PHACO AND INTRAOCULAR LENS PLACEMENT (IOC);  Surgeon: Adonis Brook, MD;  Location: [place];  Service: Ophthalmology;  Laterality: Right;  . CATARACT EXTRACTION W/PHACO Left 05/03/2013   Procedure: CATARACT EXTRACTION PHACO AND INTRAOCULAR LENS PLACEMENT (IOC);  Surgeon: Adonis Brook, MD;  Location: [place];  Service: Ophthalmology;  Laterality: Left;  . EYE SURGERY    . TUBAL LIGATION      OB History    No data available       Home Medications    Prior to Admission medications   Medication Sig Start Date End Date Taking? Authorizing Provider  albuterol (ACCUNEB) 0.63 MG/3ML nebulizer solution Take 1 ampule by nebulization every 6 (six) hours as needed for wheezing.   Yes [provider]  albuterol (PROAIR HFA) 108 (90 BASE) MCG/ACT inhaler 2 puffs every 4 hours as needed only  if your can't catch your breath Patient taking differently: Inhale 2 puffs into the lungs every 6 (six) hours as needed for shortness of breath. 2 puffs every 4 hours as needed only  if your can't catch your breath 04/03/14  Yes Wert,  Christena Deem, MD  allopurinol (ZYLOPRIM) 300 MG tablet Take 300 mg by mouth daily.   Yes [provider]  ASPIRIN ADULT LOW STRENGTH 81 MG EC tablet TAKE 1 TABLET BY MOUTH EVERY DAY Patient taking differently: Take 81 mg by mouth once a day 06/08/17  Yes Bensimhon, Shaune Pascal, MD  atorvastatin (LIPITOR) 40 MG tablet Take 40 mg by mouth daily at 6 PM.   Yes [provider]  b complex vitamins tablet Take 1 tablet by mouth daily.   Yes [provider]  budesonide-formoterol (SYMBICORT) 160-4.5 MCG/ACT inhaler INHALE 2 PUFFS FIRST THING IN THE MORNING AND THEN ANOTHER 2 PUFFS ABOUT 12 HOURS LATER 04/03/14  Yes Tanda Rockers, MD  Cholecalciferol (VITAMIN D3) 1000 units CAPS Take 1,000 Units by mouth daily.   Yes [provider]  co-enzyme Q-10 30 MG capsule Take 30 mg by mouth daily.   Yes [provider]  furosemide (LASIX) 40 MG tablet Take 0.5-1 tablets (20-40 mg total) by mouth as directed. Take 40 mg in the AM and 20 mg in the PM Patient taking differently: Take 40-80 mg by mouth See admin instructions. 80 mg in the morning and 40 mg in the evening 11/13/16  Yes Larey Dresser, MD  Glycopyrrolate-Formoterol (BEVESPI AEROSPHERE) 9-4.8 MCG/ACT AERO Inhale 2 puffs into the lungs 2 (two) times daily.    Yes [provider]  loratadine (CLARITIN) 10 MG tablet Take 10 mg by mouth daily.   Yes [provider]  Macitentan (OPSUMIT) 10 MG TABS Take 1 tablet (10 mg total) by mouth daily. 12/14/16  Yes Larey Dresser, MD  meloxicam (MOBIC) 15 MG tablet Take 15 mg by mouth daily as needed for pain.   Yes [provider]  metFORMIN (GLUCOPHAGE) 500 MG tablet Take 500 mg by mouth 2 (two) times daily with a meal.   Yes [provider]  mometasone-formoterol (DULERA) 200-5 MCG/ACT AERO Inhale 2 puffs into the lungs 2 (two) times daily.   Yes [provider]  Omega 3 1000 MG CAPS Take 1 capsule by mouth daily.   Yes [provider]  omeprazole (PRILOSEC) 40 MG capsule Take 40 mg by mouth daily.   Yes [provider]  OXYGEN Inhale 2 L into the lungs continuous.   Yes [provider]  Potassium Chloride ER 20 MEQ TBCR Take 20 mEq by mouth daily. 03/11/17  Yes Bensimhon, Shaune Pascal, MD  Riociguat (ADEMPAS) 0.5 MG TABS Take 1 tablet by mouth 3 (three) times daily.   Yes [provider]  valsartan-hydrochlorothiazide (DIOVAN-HCT) 160-25 MG tablet Take  1 tablet by mouth daily.    [provider]    Family History Family History  Problem Relation Age of Onset  . Heart disease Mother   . Heart failure Mother   . Heart disease Brother   . Heart disease Brother   . Heart disease Sister   . Heart disease Sister   . Heart failure Father     Social History Social History  Substance Use Topics  . Smoking status: Former Smoker    Packs/day: 1.50    Years: 40.00    Types: Cigarettes    Quit date: 11/16/2004  . Smokeless tobacco: Former Systems developer  . Alcohol use No     Comment: occassional     Allergies   Aldomet [methyldopa] and Tyvaso [treprostinil]   Review of Systems Review of Systems All other systems negative except as documented in the  HPI. All pertinent positives and negatives as reviewed in the HPI.  Physical Exam Updated Vital Signs BP (!) 123/52 (BP Location: Right Arm)   Pulse (!) 113   Temp 98.8 F (37.1 C) (Oral)   Resp 20   SpO2 93%   Physical Exam  Constitutional: She is oriented to person, place, and time. She appears well-developed and well-nourished. No distress.  HENT:  Head: Normocephalic and atraumatic.  Mouth/Throat: Oropharynx is clear and moist.  Eyes: Pupils are equal, round, and reactive to light.  Neck: Normal range of motion. Neck supple.  Cardiovascular: Normal rate, regular rhythm and normal heart sounds.  Exam reveals no gallop and no friction rub.   No murmur heard. Pulmonary/Chest: Effort normal and breath sounds normal. No respiratory distress. She has no wheezes.  Abdominal: Soft. Bowel sounds are normal. She exhibits no distension. There is no tenderness.  Neurological: She is alert and oriented to person, place, and time. She exhibits normal muscle tone. Coordination normal.  Skin: Skin is warm and dry. Capillary refill takes less than 2 seconds. No rash noted. No erythema.  Psychiatric: She has a normal mood and affect. Her behavior is normal.  Nursing note and vitals  reviewed.    ED Treatments / Results  Labs (all labs ordered are listed, but only abnormal results are displayed) Labs Reviewed  CBC - Abnormal; Notable for the following:       Result Value   RBC 3.33 (*)    Hemoglobin 9.6 (*)    HCT 32.0 (*)    RDW 16.2 (*)    All other components within normal limits  COMPREHENSIVE METABOLIC PANEL - Abnormal; Notable for the following:    Chloride 98 (*)    Glucose, Bld 179 (*)    BUN 39 (*)    Creatinine, Ser 2.00 (*)    Total Protein 5.9 (*)    Albumin 3.4 (*)    GFR calc non Af Amer 23 (*)    GFR calc Af Amer 27 (*)    All other components within normal limits  BASIC METABOLIC PANEL - Abnormal; Notable for the following:    Glucose, Bld 128 (*)    BUN 37 (*)    Creatinine, Ser 1.38 (*)    Calcium 8.8 (*)    GFR calc non Af Amer 36 (*)    GFR calc Af Amer 42 (*)    All other components within normal limits  CBC - Abnormal; Notable for the following:    RBC 3.07 (*)    Hemoglobin 9.1 (*)    HCT 29.7 (*)    RDW 16.5 (*)    All other components within normal limits  URINALYSIS, ROUTINE W REFLEX MICROSCOPIC - Abnormal; Notable for the following:    Leukocytes, UA TRACE (*)    Squamous Epithelial / LPF 0-5 (*)    All other components within normal limits  GLUCOSE, CAPILLARY - Abnormal; Notable for the following:    Glucose-Capillary 103 (*)    All other components within normal limits  GLUCOSE, CAPILLARY - Abnormal; Notable for the following:    Glucose-Capillary 117 (*)    All other components within normal limits  CBG MONITORING, ED - Abnormal; Notable for the following:    Glucose-Capillary 121 (*)    All other components within normal limits  I-STAT CHEM 8, ED - Abnormal; Notable for the following:    Chloride 98 (*)    BUN 40 (*)    Creatinine, Ser 2.10 (*)  Glucose, Bld 172 (*)    Calcium, Ion 1.11 (*)    Hemoglobin 10.9 (*)    HCT 32.0 (*)    All other components within normal limits  CBG MONITORING, ED -  Abnormal; Notable for the following:    Glucose-Capillary 124 (*)    All other components within normal limits  CBG MONITORING, ED - Abnormal; Notable for the following:    Glucose-Capillary 128 (*)    All other components within normal limits  URINE CULTURE  PROTIME-INR  APTT  DIFFERENTIAL  CORTISOL-AM, BLOOD  BRAIN NATRIURETIC PEPTIDE  CREATININE, URINE, RANDOM  UREA NITROGEN, URINE  I-STAT TROPONIN, ED    EKG  EKG Interpretation None       Radiology Dg Chest 2 View  Result Date: 06/22/2017 CLINICAL DATA:  Chronic shortness of breath.  Initial encounter. EXAM: CHEST  2 VIEW COMPARISON:  Chest radiograph performed 06/07/2017 FINDINGS: The lungs are well-aerated. Vascular congestion is noted. There is no evidence of focal opacification, pleural effusion or pneumothorax. The heart is borderline enlarged. No acute osseous abnormalities are seen. IMPRESSION: Vascular congestion and borderline cardiomegaly. Lungs remain grossly clear. Electronically Signed   By: Garald Balding M.D.   On: 06/22/2017 21:42   Ct Head Wo Contrast  Result Date: 06/22/2017 CLINICAL DATA:  76 year old diabetic hypertensive female with altered mental status with increased confusion and slurred speech onset yesterday. Initial encounter. EXAM: CT HEAD WITHOUT CONTRAST TECHNIQUE: Contiguous axial images were obtained from the base of the skull through the vertex without intravenous contrast. COMPARISON:  None. FINDINGS: Brain: No intracranial hemorrhage or CT evidence of large acute infarct. Mild global atrophy without hydrocephalus. No intracranial mass lesion noted on this unenhanced exam. Vascular: Vascular calcifications. Skull: Negative. Sinuses/Orbits: Mild exophthalmos post lens replacement. Partial opacification left ethmoid air cell. Other: Mastoid air cells and middle ear cavities are clear. IMPRESSION: No intracranial hemorrhage or CT evidence of large acute infarct. Mild atrophy. Mild exophthalmos.  Opacification single left ethmoid sinus air cell. Vascular calcifications. Electronically Signed   By: Genia Del M.D.   On: 06/22/2017 17:38   Mr Brain Wo Contrast  Result Date: 06/23/2017 CLINICAL DATA:  76 y/o F; altered level of consciousness unexplained. Confusion and slurred speech. EXAM: MRI HEAD WITHOUT CONTRAST TECHNIQUE: Multiplanar, multiecho pulse sequences of the brain and surrounding structures were obtained without intravenous contrast. COMPARISON:  None. FINDINGS: Brain: No acute infarction, hemorrhage, hydrocephalus, extra-axial collection or mass lesion. Few nonspecific foci of T2 FLAIR hyperintense signal abnormality in subcortical and periventricular white matter is compatible with mild chronic microvascular ischemic changes for age. Mild brain parenchymal volume loss. Vascular: Normal flow voids. Skull and upper cervical spine: Normal marrow signal. Sinuses/Orbits: Negative. Other: Bilateral intra-ocular lens replacement. IMPRESSION: 1. No acute intracranial abnormality identified. 2. Mild for age chronic microvascular ischemic changes and mild parenchymal volume loss of the brain. Electronically Signed   By: Kristine Garbe M.D.   On: 06/23/2017 17:39    Procedures Procedures (including critical care time)  Medications Ordered in ED Medications  aspirin EC tablet 81 mg (81 mg Oral Given 06/23/17 1018)  allopurinol (ZYLOPRIM) tablet 300 mg (300 mg Oral Given 06/23/17 1720)  atorvastatin (LIPITOR) tablet 40 mg (40 mg Oral Given 06/23/17 2021)  mometasone-formoterol (DULERA) 200-5 MCG/ACT inhaler 2 puff (2 puffs Inhalation Not Given 06/23/17 2328)  omega-3 acid ethyl esters (LOVAZA) capsule 1 g (1 g Oral Given 06/23/17 1719)  pantoprazole (PROTONIX) EC tablet 40 mg (40 mg Oral Given 06/23/17 1018)  levalbuterol (XOPENEX) nebulizer solution 1.25 mg (1.25 mg Nebulization Not Given 06/23/17 2328)  0.9 %  sodium chloride infusion ( Intravenous New Bag/Given 06/23/17 2241)  insulin  aspart (novoLOG) injection 0-9 Units (0 Units Subcutaneous Not Given 06/23/17 1700)  insulin aspart (novoLOG) injection 0-5 Units (0 Units Subcutaneous Not Given 06/23/17 2200)  enoxaparin (LOVENOX) injection 40 mg (40 mg Subcutaneous Given 06/23/17 1720)  acetaminophen (TYLENOL) tablet 650 mg (not administered)    Or  acetaminophen (TYLENOL) suppository 650 mg (not administered)  ondansetron (ZOFRAN) tablet 4 mg (not administered)    Or  ondansetron (ZOFRAN) injection 4 mg (not administered)  zolpidem (AMBIEN) tablet 5 mg (not administered)  sodium chloride 0.9 % bolus 500 mL (0 mLs Intravenous Stopped 06/22/17 1840)  LORazepam (ATIVAN) injection 1 mg (1 mg Intravenous Given 06/23/17 1337)     Initial Impression / Assessment and Plan / ED Course  I have reviewed the triage vital signs and the nursing notes.  Pertinent labs & imaging results that were available during my care of the patient were reviewed by me and considered in my medical decision making (see chart for details).    Fields patient needed admission for further evaluation of her general weakness and rule out any neurological causes for this patient is advised of this plan and all questions were answered.  The patient does need further evaluation for stroke, TIA, possibilities along with metabolic causes  Final Clinical Impressions(s) / ED Diagnoses   Final diagnoses:  Weakness    New Prescriptions Current Discharge Medication List       Dalia Heading, Hershal Coria 06/24/17 [id number]    Quintella Reichert, MD 06/25/17 [phone]

## 2017-06-24 NOTE — Evaluation (Signed)
Physical Therapy Evaluation Patient Details Name: Beth [name] MRN: [id number] DOB: 09/25/1941 Today's Date: 06/24/2017   History of Present Illness  Pt is a 76 y/o female admitted secondary to increased confusion and slurred speech. MRI negative for acute abnormality. CT negative for acute abnormality, with mild atrophy and mild exophthalmos. PMH includes COPD on 2L of home oxygen, HTN, CHF, DM, CKD-3, pulmonary HTN, and gout.   Clinical Impression  Pt admitted secondary to problem above with deficits below. PTA, pt reports she was independent with functional mobility. Used 2L of oxygen at baseline. Upon eval, pt limited by decreased cognition, decreased cardiopulmonary endurance, decreased strength, and decreased balance. Pt had tendency to drift L and was unable to avoid obstacles on the L despite verbal and manual cues. Pt's oxygen sats decreased to 75% on RA, with DOE at 3/4 with ambulation and HR elevated to 138 bpm. Pt required 4L and seated rest to increase oxygen sats to 96%. Returned oxygen to 2L and oxygen sats maintained. Educated about current mobility concerns at home, and educated about SNF, however, pt refusing. Educated she would need 24 hour assist at home for safety, and pt reports daughters could assist, however, at beginning of session, reports she did not have help at home. Will need to follow up with family to determine assist level they are able to provide at d/c. Recommending OT consult to evaluate cognition and safety with ADLs. Follow up recs below. Will continue to follow acutely to maximize functional mobility independence and safety.     Follow Up Recommendations Home health PT;Supervision/Assistance - 24 hour (HHOT )    Equipment Recommendations  None recommended by PT    Recommendations for Other Services OT consult     Precautions / Restrictions Precautions Precautions: Fall Restrictions Weight Bearing Restrictions: No      Mobility  Bed Mobility                General bed mobility comments: In chair upon entry   Transfers Overall transfer level: Needs assistance Equipment used: Rolling walker (2 wheeled) Transfers: Sit to/from Stand Sit to Stand: Min guard         General transfer comment: Min guard for safety. Verbal cues for safe hand placement.   Ambulation/Gait Ambulation/Gait assistance: Min guard;Min assist Ambulation Distance (Feet): 50 Feet Assistive device: Rolling walker (2 wheeled) Gait Pattern/deviations: Step-through pattern;Decreased dorsiflexion - right;Drifts right/left Gait velocity: Decreased Gait velocity interpretation: Below normal speed for age/gender General Gait Details: Slow, unsteady gait. Attempted gait without AD, however, pt with LOB requiring min A for steadying. With use of RW, pt tended to drift to the L, despite verbal cues, and had trouble with obstacle navigation on the L despite cues. Pt with SOB during ambulation and required multiple standing rest breaks. Oxygen sats checked and went down to 75% on 2L. Required seated rest and increased to 4L with cues for pursed lip breathing to return to 96% on 4L. Returned oxygen to 2L at end of session and sats maintained at 96%. HR elevated to 138 during mobility.   Stairs            Wheelchair Mobility    Modified Rankin (Stroke Patients Only)       Balance Overall balance assessment: Needs assistance Sitting-balance support: No upper extremity supported;Feet supported Sitting balance-Leahy Scale: Fair     Standing balance support: Bilateral upper extremity supported;During functional activity Standing balance-Leahy Scale: Poor Standing balance comment: Reliant on RW for support  Pertinent Vitals/Pain Pain Assessment: No/denies pain    Home Living Family/patient expects to be discharged to:: Private residence Living Arrangements: Alone Available Help at Discharge: Family;Neighbor Type of  Home: House Home Access: [place]: One [place]: [place] - 4 wheels;Shower seat;Cane - single point      Prior Function Level of Independence: Independent               Hand Dominance   Dominant Hand: Right    Extremity/Trunk Assessment   Upper Extremity Assessment Upper Extremity Assessment: Defer to OT evaluation    Lower Extremity Assessment Lower Extremity Assessment: Generalized weakness    Cervical / Trunk Assessment Cervical / Trunk Assessment: Kyphotic  Communication   Communication: No difficulties  Cognition Arousal/Alertness: Awake/alert Behavior During Therapy: WFL for tasks assessed/performed Overall Cognitive Status: No family/caregiver present to determine baseline cognitive functioning                                 General Comments: Oriented to person and place; disoriented to time. Pt with decreased STM and safety awareness. Reported differing things throughout session about baseline functioning and living environment. Pt very unaware of current deficits.       General Comments General comments (skin integrity, edema, etc.): Educated pt about concerns of returning home alone. Educated about SNF to address safety and balance deficits with mobility, however, pt refusing. When asked about available assist at home, pt reported her family could come stay with her, despite previous reports that no one could stay. Will need to follow up with family to confirm.     Exercises     Assessment/Plan    PT Assessment Patient needs continued PT services  PT Problem List Cardiopulmonary status limiting activity;Decreased safety awareness;Decreased knowledge of precautions;Decreased knowledge of use of DME;Decreased cognition;Decreased mobility;Decreased balance;Decreased activity tolerance;Decreased strength       PT Treatment Interventions DME instruction;Gait training;Functional mobility training;Therapeutic  exercise;Balance training;Therapeutic activities;Patient/family education;Cognitive remediation;Neuromuscular re-education    PT Goals (Current goals can be found in the Care Plan section)  Acute Rehab PT Goals Patient Stated Goal: to go home  PT Goal Formulation: With patient Time For Goal Achievement: 07/08/17 Potential to Achieve Goals: Fair    Frequency Min 3X/week   Barriers to discharge Decreased caregiver support Unsure of available caregiver support at home     Co-evaluation               AM-PAC PT "6 Clicks" Daily Activity  Outcome Measure Difficulty turning over in bed (including adjusting bedclothes, sheets and blankets)?: A Lot Difficulty moving from lying on back to sitting on the side of the bed? : A Lot Difficulty sitting down on and standing up from a chair with arms (e.g., wheelchair, bedside commode, etc,.)?: Total Help needed moving to and from a bed to chair (including a wheelchair)?: A Little Help needed walking in hospital room?: A Little Help needed climbing 3-5 steps with a railing? : A Lot 6 Click Score: 13    End of Session Equipment Utilized During Treatment: Gait belt;Oxygen Activity Tolerance: Treatment limited secondary to medical complications (Comment) (Decreased oxygen sats; elevated HR. ) Patient left: in chair;with call bell/phone within reach Nurse Communication: Mobility status PT Visit Diagnosis: Unsteadiness on feet (R26.81);Other abnormalities of gait and mobility (R26.89);Muscle weakness (generalized) (M62.81)    Time: 2671-2458 PT Time Calculation (min) (ACUTE ONLY): 39 min  Charges:   PT Evaluation $PT Eval Moderate Complexity: 1 Mod PT Treatments $Gait Training: 8-22 mins   PT G Codes:   PT G-Codes **NOT FOR INPATIENT CLASS** Functional Assessment Tool Used: AM-PAC 6 Clicks Basic Mobility;Clinical judgement Functional Limitation: Mobility: Walking and moving around Mobility: Walking and Moving Around Current Status  (P8099): At least 40 percent but less than 60 percent impaired, limited or restricted Mobility: Walking and Moving Around Goal Status [phone]): At least 1 percent but less than 20 percent impaired, limited or restricted    Leighton Ruff, PT, DPT  Acute Rehabilitation Services  Pager: [place] 06/24/2017, 12:00 PM

## 2017-06-24 NOTE — Progress Notes (Signed)
Pt discharging home with [facility] services. CM met with the patient and her daughter and provided a [facility] list. They selected [facility]. Cory with [facility] notified and accepted the referral. Pt oxygen dependent at home. She uses [facility] for her oxygen and supplies. Daughter has oxygen tank in the room for transport home.  Daughter is going to take her to her home ([address] In [place]) Tommi Rumps is aware. Contact number is: [phone]. Daughter to provide transportation home.

## 2017-06-24 NOTE — Care Management Obs Status (Signed)
MEDICARE OBSERVATION STATUS NOTIFICATION   Patient Details  Name: KASIAH MANKA MRN: [id number] Date of Birth: 08/12/1941   Medicare Observation Status Notification Given:  Yes    Pollie Friar, RN 06/24/2017, 3:49 PM

## 2017-06-24 NOTE — Discharge Summary (Signed)
Physician Discharge Summary  Beth [name]  BOF:[id number]  DOB: 06/30/1941  DOA: 06/22/2017 PCP: Willey Blade, MD  Admit date: 06/22/2017 Discharge date: 06/24/2017  Admitted From: Home  Disposition:  Home   Recommendations for Outpatient Follow-up:  1. Follow up with PCP in 1-2 weeks 2. Please obtain BMP/CBC in one week to monitor hemoglobin and renal function  Home Health: [facility] PT/OT Aide   Discharge Condition: Stable  CODE STATUS: FULL  Diet recommendation: Heart Healthy   Brief/Interim Summary: For complete details see H&P but in brief. Beth [name] is a 76 year old female with medical history of hypertension, hyperlipidemia, diabetes mellitus, COPD on 2 L oxygen at home, asthma, GERD, diastolic CHF, pulmonary hypertension, chronic kidney disease stage III who presented with confusion and slurred speech. Patient daughter reported that patient became confused and noted mild slurred speech but no unilateral weakness. Upon ED evaluation patient was already alert and oriented 3 with no weakness or slurred speech. She was found to be hypotensive with blood pressure of 79/42 which improved after IV fluid bolus. CT scan of the head did not show any acute abnormalities and she was placed on observation for further evaluation. MRI of the head was done with negative results. On blood work up her creatinine was found to be slight worsening and this was treated with IV fluids. Patient clinically improved, was elevated by physical therapy who recommended PT/OT at home. Blood pressure has remained stable and no further neurologic deficit has been noted.  Subjective: Patient seen and examined on the day of discharge she reports doing much better than when she was admitted. Has no complaint. Her breathing is at baseline. Denies any weakness, dizziness, chest pain, palpitations and blurry vision. Patient remains afebrile, tolerating diet well. Kidney function has improved  Discharge  Diagnoses/Hospital Course:  Hypotension Multifactorial, patient on multiple blood pressure lowering agents, diuretics and medications for pulmonary hypertension. She was treated with IV fluids which improved blood pressure. During hospital stay, home medications were held, and blood pressure went above goal with IV fluids. Macitentan and Riociguat were resume and blood pressure up on discharge was more stable. Valsartan and HCTZ were held and will continue to be on hold until patient is seen by cardiology or primary care physician.  Acute metabolic encephalopathy Hypotension and dehydration may have contributed to her AMS, although would not explain slurred speech. Workup so far reassuring as MRI and CT of the head not show any acute abnormality. Patient is back to baseline, she is alert and oriented 3 and her speech is clear. Will defer to PCP if further workup is necessary.  Acute renal failure on chronic kidney disease Baseline creatinine is 1.0, pulmonary admission serum creatinine was 2.0. Highly secondary to dehydration and continuation of ARB is, diuretics and NSAIDs. Patient also hypotensive ATN maybe a component. Serum creatinine back to baseline, improving with IV fluids. Patient was encouraged to follow-up with primary care physician. And encouraged to keep good oral hydration.   COPD - stable No changes in medication were made during hospital stay  Chronic diastolic heart failure Patient was dry during hospital stay. She was given IV fluid therefore will resume Lasix upon discharge. Follow-up with cardiology  Hypertension Patient was admitted due to hypotension Valsartan and HCTZ were placed on hold, and will continue to be on hold until patient is evaluated by cardiology or primary care physician. Patient on medications for pulmonary hypertension which have fairly significant effect on lowering BP.   Pulmonary hypertension  Continue Macitentan and Riociguat  All other chronic  medical condition were stable during the hospitalization.  Patient seen by PT recommending [facility] PT/OT  On the day of the discharge the patient's vitals were stable, and no other acute medical condition were reported by patient. Patient was felt safe to be discharge to home   Discharge Instructions  You were cared for by a hospitalist during your hospital stay. If you have any questions about your discharge medications or the care you received while you were in the hospital after you are discharged, you can call the unit and asked to speak with the hospitalist on call if the hospitalist that took care of you is not available. Once you are discharged, your primary care physician will handle any further medical issues. Please note that NO REFILLS for any discharge medications will be authorized once you are discharged, as it is imperative that you return to your primary care physician (or establish a relationship with a primary care physician if you do not have one) for your aftercare needs so that they can reassess your need for medications and monitor your lab values.  Discharge Instructions    Call MD for:  difficulty breathing, headache or visual disturbances    Complete by:  As directed    Call MD for:  extreme fatigue    Complete by:  As directed    Call MD for:  hives    Complete by:  As directed    Call MD for:  persistant dizziness or light-headedness    Complete by:  As directed    Call MD for:  persistant nausea and vomiting    Complete by:  As directed    Call MD for:  redness, tenderness, or signs of infection (pain, swelling, redness, odor or green/yellow discharge around incision site)    Complete by:  As directed    Call MD for:  severe uncontrolled pain    Complete by:  As directed    Call MD for:  temperature >100.4    Complete by:  As directed    Diet - low sodium heart healthy    Complete by:  As directed    Increase activity slowly    Complete by:  As directed       Allergies as of 06/24/2017      Reactions   Aldomet [methyldopa] Hives   Tyvaso [treprostinil] Diarrhea      Medication List    STOP taking these medications   meloxicam 15 MG tablet Commonly known as:  MOBIC   valsartan-hydrochlorothiazide 160-25 MG tablet Commonly known as:  DIOVAN-HCT     TAKE these medications   ADEMPAS 0.5 MG Tabs Generic drug:  Riociguat Take 1 tablet by mouth 3 (three) times daily.   albuterol 0.63 MG/3ML nebulizer solution Commonly known as:  ACCUNEB Take 1 ampule by nebulization every 6 (six) hours as needed for wheezing. What changed:  Another medication with the same name was changed. Make sure you understand how and when to take each.   albuterol 108 (90 Base) MCG/ACT inhaler Commonly known as:  PROAIR HFA 2 puffs every 4 hours as needed only  if your can't catch your breath What changed:  how much to take  how to take this  when to take this  reasons to take this  additional instructions   allopurinol 300 MG tablet Commonly known as:  ZYLOPRIM Take 300 mg by mouth daily.   ASPIRIN ADULT LOW STRENGTH 81 MG  EC tablet Generic drug:  aspirin TAKE 1 TABLET BY MOUTH EVERY DAY What changed:  See the new instructions.   atorvastatin 40 MG tablet Commonly known as:  LIPITOR Take 40 mg by mouth daily at 6 PM.   b complex vitamins tablet Take 1 tablet by mouth daily.   BEVESPI AEROSPHERE 9-4.8 MCG/ACT Aero Generic drug:  Glycopyrrolate-Formoterol Inhale 2 puffs into the lungs 2 (two) times daily.   budesonide-formoterol 160-4.5 MCG/ACT inhaler Commonly known as:  SYMBICORT INHALE 2 PUFFS FIRST THING IN THE MORNING AND THEN ANOTHER 2 PUFFS ABOUT 12 HOURS LATER   co-enzyme Q-10 30 MG capsule Take 30 mg by mouth daily.   DULERA 200-5 MCG/ACT Aero Generic drug:  mometasone-formoterol Inhale 2 puffs into the lungs 2 (two) times daily.   furosemide 40 MG tablet Commonly known as:  LASIX Take 0.5-1 tablets (20-40 mg total) by  mouth as directed. Take 40 mg in the AM and 20 mg in the PM What changed:  how much to take  when to take this  additional instructions   loratadine 10 MG tablet Commonly known as:  CLARITIN Take 10 mg by mouth daily.   Macitentan 10 MG Tabs Commonly known as:  OPSUMIT Take 1 tablet (10 mg total) by mouth daily.   metFORMIN 500 MG tablet Commonly known as:  GLUCOPHAGE Take 500 mg by mouth 2 (two) times daily with a meal.   Omega 3 1000 MG Caps Take 1 capsule by mouth daily.   omeprazole 40 MG capsule Commonly known as:  PRILOSEC Take 40 mg by mouth daily.   OXYGEN Inhale 2 L into the lungs continuous.   Potassium Chloride ER 20 MEQ Tbcr Take 20 mEq by mouth daily.   Vitamin D3 1000 units Caps Take 1,000 Units by mouth daily.       Allergies  Allergen Reactions  . Aldomet [Methyldopa] Hives  . Tyvaso [Treprostinil] Diarrhea    Consultations:  None   Procedures/Studies: Dg Chest 2 View  Result Date: 06/22/2017 CLINICAL DATA:  Chronic shortness of breath.  Initial encounter. EXAM: CHEST  2 VIEW COMPARISON:  Chest radiograph performed 06/07/2017 FINDINGS: The lungs are well-aerated. Vascular congestion is noted. There is no evidence of focal opacification, pleural effusion or pneumothorax. The heart is borderline enlarged. No acute osseous abnormalities are seen. IMPRESSION: Vascular congestion and borderline cardiomegaly. Lungs remain grossly clear. Electronically Signed   By: Garald Balding M.D.   On: 06/22/2017 21:42   Dg Chest 2 View  Result Date: 06/07/2017 CLINICAL DATA:  Shortness of breath. EXAM: CHEST  2 VIEW COMPARISON:  01/06/2017. FINDINGS: Mediastinum and hilar structures are normal. Stable cardiomegaly with normal pulmonary vascularity. No focal infiltrate. No pleural effusion or pneumothorax. IMPRESSION: Stable cardiomegaly.  No evidence of overt congestive heart failure. Electronically Signed   By: Marcello Moores  Register   On: 06/07/2017 16:08   Ct Head  Wo Contrast  Result Date: 06/22/2017 CLINICAL DATA:  76 year old diabetic hypertensive female with altered mental status with increased confusion and slurred speech onset yesterday. Initial encounter. EXAM: CT HEAD WITHOUT CONTRAST TECHNIQUE: Contiguous axial images were obtained from the base of the skull through the vertex without intravenous contrast. COMPARISON:  None. FINDINGS: Brain: No intracranial hemorrhage or CT evidence of large acute infarct. Mild global atrophy without hydrocephalus. No intracranial mass lesion noted on this unenhanced exam. Vascular: Vascular calcifications. Skull: Negative. Sinuses/Orbits: Mild exophthalmos post lens replacement. Partial opacification left ethmoid air cell. Other: Mastoid air cells and middle ear  cavities are clear. IMPRESSION: No intracranial hemorrhage or CT evidence of large acute infarct. Mild atrophy. Mild exophthalmos. Opacification single left ethmoid sinus air cell. Vascular calcifications. Electronically Signed   By: Genia Del M.D.   On: 06/22/2017 17:38   Mr Brain Wo Contrast  Result Date: 06/23/2017 CLINICAL DATA:  76 y/o F; altered level of consciousness unexplained. Confusion and slurred speech. EXAM: MRI HEAD WITHOUT CONTRAST TECHNIQUE: Multiplanar, multiecho pulse sequences of the brain and surrounding structures were obtained without intravenous contrast. COMPARISON:  None. FINDINGS: Brain: No acute infarction, hemorrhage, hydrocephalus, extra-axial collection or mass lesion. Few nonspecific foci of T2 FLAIR hyperintense signal abnormality in subcortical and periventricular white matter is compatible with mild chronic microvascular ischemic changes for age. Mild brain parenchymal volume loss. Vascular: Normal flow voids. Skull and upper cervical spine: Normal marrow signal. Sinuses/Orbits: Negative. Other: Bilateral intra-ocular lens replacement. IMPRESSION: 1. No acute intracranial abnormality identified. 2. Mild for age chronic microvascular  ischemic changes and mild parenchymal volume loss of the brain. Electronically Signed   By: Kristine Garbe M.D.   On: 06/23/2017 17:39     Discharge Exam: Vitals:   06/24/17 1400 06/24/17 1445  BP: (!) 146/65   Pulse: (!) 102 100  Resp: 18 18  Temp: 98.8 F (37.1 C)   SpO2: 95% 95%   Vitals:   06/24/17 0937 06/24/17 1049 06/24/17 1400 06/24/17 1445  BP: (!) 164/62 133/71 (!) 146/65   Pulse: (!) 103 (!) 102 (!) 102 100  Resp: _0 Temp:  98.7 F (37.1 C) 98.8 F (37.1 C)   TempSrc:  Oral Oral   SpO2: 95% 95% 95% 95%    General: Pt is alert, awake, not in acute distress Cardiovascular: RRR, S1/S2 +, no rubs, no gallops Respiratory: CTA bilaterally, no wheezing, no rhonchi Abdominal: Soft, NT, ND, bowel sounds + Extremities: no edema, no cyanosis  The results of significant diagnostics from this hospitalization (including imaging, microbiology, ancillary and laboratory) are listed below for reference.     Microbiology: Recent Results (from the past 240 hour(s))  Urine Culture     Status: Abnormal   Collection Time: 06/23/17  1:20 AM  Result Value Ref Range Status   Specimen Description Urine  Final   Special Requests NONE  Final   Culture MULTIPLE SPECIES PRESENT, SUGGEST RECOLLECTION (A)  Final   Report Status 06/24/2017 FINAL  Final     Labs: BNP (last 3 results)  Recent Labs  02/10/17 0929 04/13/17 0952 06/23/17 0134  BNP 20.7 21.2 73.5   Basic Metabolic Panel:  Recent Labs Lab 06/22/17 1624 06/22/17 1636 06/23/17 0159  NA 141 139 141  K 4.7 4.5 4.6  CL 98* 98* 102  CO2 32  --  31  GLUCOSE 179* 172* 128*  BUN 39* 40* 37*  CREATININE 2.00* 2.10* 1.38*  CALCIUM 9.2  --  8.8*   Liver Function Tests:  Recent Labs Lab 06/22/17 1624  AST 24  ALT 18  ALKPHOS 38  BILITOT 0.6  PROT 5.9*  ALBUMIN 3.4*   CBC:  Recent Labs Lab 06/22/17 1624 06/22/17 1636 06/23/17 0159  WBC 7.0  --  6.2  NEUTROABS 5.6  --   --   HGB 9.6*  10.9* 9.1*  HCT 32.0* 32.0* 29.7*  MCV 96.1  --  96.7  PLT 210  --  196   CBG:  Recent Labs Lab 06/23/17 1210 06/23/17 1850 06/23/17 2140 06/24/17 0603 06/24/17 1142  GLUCAP 128*  103* 117* 99 188*   Urinalysis    Component Value Date/Time   COLORURINE YELLOW 06/23/2017 0120   APPEARANCEUR CLEAR 06/23/2017 0120   LABSPEC 1.014 06/23/2017 0120   PHURINE 5.0 06/23/2017 0120   GLUCOSEU NEGATIVE 06/23/2017 0120   HGBUR NEGATIVE 06/23/2017 0120   BILIRUBINUR NEGATIVE 06/23/2017 0120   KETONESUR NEGATIVE 06/23/2017 0120   PROTEINUR NEGATIVE 06/23/2017 0120   NITRITE NEGATIVE 06/23/2017 0120   LEUKOCYTESUR TRACE (A) 06/23/2017 0120   Sepsis Labs Invalid input(s): PROCALCITONIN,  WBC,  LACTICIDVEN Microbiology Recent Results (from the past 240 hour(s))  Urine Culture     Status: Abnormal   Collection Time: 06/23/17  1:20 AM  Result Value Ref Range Status   Specimen Description Urine  Final   Special Requests NONE  Final   Culture MULTIPLE SPECIES PRESENT, SUGGEST RECOLLECTION (A)  Final   Report Status 06/24/2017 FINAL  Final    Time coordinating discharge: 35 minutes  SIGNED:  Chipper Oman, MD  [facility] 06/24/2017, 6:59 PM  Pager please text page via  [email or web address] Password [email or web address]

## 2017-06-29 ENCOUNTER — Telehealth (HOSPITAL_COMMUNITY): Payer: Self-pay

## 2017-06-29 ENCOUNTER — Telehealth (HOSPITAL_COMMUNITY): Payer: Self-pay | Admitting: Pharmacist

## 2017-06-29 ENCOUNTER — Other Ambulatory Visit (HOSPITAL_COMMUNITY): Payer: Self-pay | Admitting: Pharmacist

## 2017-06-29 DIAGNOSIS — J810 Acute pulmonary edema: Secondary | ICD-10-CM

## 2017-06-29 MED ORDER — MACITENTAN 10 MG PO TABS: 10.0000 mg | ORAL_TABLET | Freq: Every day | ORAL | 11 refills | Status: DC

## 2017-06-29 NOTE — Telephone Encounter (Signed)
Ms. Huang stated she never received her last shipment of Opsumit. It looks like she was receiving it through [facility] patient assistance program and they had been trying to reach her but her voicemail box was full. I have called and spoken with Ms. Gavin's daughter, Lorriane Shire, who will call [employer] at [phone] to schedule shipment.   [name]. Velva Harman, PharmD, BCPS, CPP Clinical Pharmacist Pager: [phone] Phone: [phone] 06/29/2017 4:17 PM

## 2017-06-29 NOTE — Telephone Encounter (Signed)
Beth [name] with [facility] called to clarify whether or not patient should be on metoprolo 25 mg once daily. Returned call and left detailed VM stating that this is not an active med on her current list per our records and to call back to clarify any further questions/concerns.  Renee Pain, RN

## 2017-07-09 ENCOUNTER — Telehealth (HOSPITAL_COMMUNITY): Payer: Self-pay | Admitting: Pharmacist

## 2017-07-09 NOTE — Telephone Encounter (Signed)
Noticed that [name] has used up her first grant through the [employer] for her Galt so I have renewed her enrollment so that she will have another $5300 to use toward her copays through 01/28/18.   Patient Name - Beth [name] DOB - March 07, 1941 Member ID - [id number] Disease Fund - Pulmonary Hypertension 2nd grant amount - $5,300  Total remaining balance - $5,300  Eligibility End Date - 01/28/2018  Claims Submission End Date - 05/28/2018   [name]. Velva Harman, PharmD, BCPS, CPP Clinical Pharmacist Pager: [phone] Phone: [phone] 07/09/2017 1:40 PM

## 2017-07-16 ENCOUNTER — Ambulatory Visit (INDEPENDENT_AMBULATORY_CARE_PROVIDER_SITE_OTHER): Payer: Medicare HMO | Admitting: Internal Medicine

## 2017-07-16 ENCOUNTER — Encounter: Payer: Self-pay | Admitting: Internal Medicine

## 2017-07-16 VITALS — BP 132/80 | HR 122 | Ht 62.0 in | Wt 131.8 lb

## 2017-07-16 DIAGNOSIS — I272 Pulmonary hypertension, unspecified: Secondary | ICD-10-CM

## 2017-07-16 DIAGNOSIS — J9612 Chronic respiratory failure with hypercapnia: Secondary | ICD-10-CM

## 2017-07-16 DIAGNOSIS — J449 Chronic obstructive pulmonary disease, unspecified: Secondary | ICD-10-CM | POA: Diagnosis not present

## 2017-07-16 DIAGNOSIS — D638 Anemia in other chronic diseases classified elsewhere: Secondary | ICD-10-CM | POA: Diagnosis not present

## 2017-07-16 DIAGNOSIS — J9611 Chronic respiratory failure with hypoxia: Secondary | ICD-10-CM

## 2017-07-16 MED ORDER — FLUTICASONE-UMECLIDIN-VILANT 100-62.5-25 MCG/INH IN AEPB: 1.0000 | INHALATION_SPRAY | Freq: Two times a day (BID) | RESPIRATORY_TRACT | 3 refills | Status: DC

## 2017-07-16 MED ORDER — FLUTICASONE-UMECLIDIN-VILANT 100-62.5-25 MCG/INH IN AEPB: 1.0000 | INHALATION_SPRAY | Freq: Two times a day (BID) | RESPIRATORY_TRACT | 0 refills | Status: DC

## 2017-07-16 NOTE — Progress Notes (Signed)
Subjective:    Patient ID: Beth [name], female    DOB: Dec 22, 1940, 76 y.o.   MRN: [id number]  HPI   Brief patient profile:  64 yobf quit smoking around 2006-7 when noted onset of doe then gradually worse since quit with no gain in wt and referred 05/31/2012 for copd evaluation by Dr Karlton Lemon with GOLD III COPD by PFT's 06/2012    History of Present Illness  05/31/2012 1st pulmonary eval cc progressive doe x 5years ? Some worse in spring and ? Summer with hot humid weather with subjective wheeze while on spiriva x 2 years then advair 7/13 and prednisone has helped to point where need neb less "only  3 x day" and puffer 3-4 times as well. Assoc with voice change/ hoarsenss, ear pain.   No unusual cough, purulent sputum or sinus/hb symptoms on present rx. rec Work on inhaler technique:   Only use your albuterol as a rescue medication (proaire is Plan B,  Nebulizer is C) Add pepcid 20 mg one every night at bedtime GERD diet office visit in 4 weeks, sooner if needed with PFTs    04/03/2014 f/u ov/Wert re: copd III / symbicort 160 2bid / proair if over does it Chief Complaint  Patient presents with  . Follow-up    No change in breathing, occas wheezing w/ exertion. uses O2 at bedtime  rec Work on inhaler technique:  F/u prn     Admit date: 06/22/2017 Discharge date: 06/24/2017  Brief/Interim Summary: For complete details see H&P but in brief. Beth [name] is a 76 year old female with medical history of hypertension, hyperlipidemia, diabetes mellitus, COPD on 2 L oxygen at home, asthma, GERD, diastolic CHF, pulmonary hypertension, chronic kidney disease stage III who presented with confusion and slurred speech. Patient daughter reported that patient became confused and noted mild slurred speech but no unilateral weakness. Upon ED evaluation patient was already alert and oriented 3 with no weakness or slurred speech. She was found to be hypotensive with blood pressure of 79/42 which  improved after IV fluid bolus. CT scan of the head did not show any acute abnormalities and she was placed on observation for further evaluation. MRI of the head was done with negative results. On blood work up her creatinine was found to be slight worsening and this was treated with IV fluids. Patient clinically improved, was elevated by physical therapy who recommended PT/OT at home. Blood pressure has remained stable and no further neurologic deficit has been noted.  Subjective: Patient seen and examined on the day of discharge she reports doing much better than when she was admitted. Has no complaint. Her breathing is at baseline. Denies any weakness, dizziness, chest pain, palpitations and blurry vision. Patient remains afebrile, tolerating diet well. Kidney function has improved  Discharge Diagnoses/Hospital Course:  Hypotension Multifactorial, patient on multiple blood pressure lowering agents, diuretics and medications for pulmonary hypertension. She was treated with IV fluids which improved blood pressure. During hospital stay, home medications were held, and blood pressure went above goal with IV fluids. Macitentan and Riociguat were resume and blood pressure up on discharge was more stable. Valsartan and HCTZ were held and will continue to be on hold until patient is seen by cardiology or primary care physician.  Acute metabolic encephalopathy Hypotension and dehydration may have contributed to her AMS, although would not explain slurred speech. Workup so far reassuring as MRI and CT of the head not show any acute abnormality. Patient is back to  baseline, she is alert and oriented 3 and her speech is clear. Will defer to PCP if further workup is necessary.  Acute renal failure on chronic kidney disease Baseline creatinine is 1.0, pulmonary admission serum creatinine was 2.0. Highly secondary to dehydration and continuation of ARB is, diuretics and NSAIDs. Patient also hypotensive ATN  maybe a component. Serum creatinine back to baseline, improving with IV fluids. Patient was encouraged to follow-up with primary care physician. And encouraged to keep good oral hydration.   COPD - stable No changes in medication were made during hospital stay  Chronic diastolic heart failure Patient was dry during hospital stay. She was given IV fluid therefore will resume Lasix upon discharge. Follow-up with cardiology  Hypertension Patient was admitted due to hypotension Valsartan and HCTZ were placed on hold, and will continue to be on hold until patient is evaluated by cardiology or primary care physician. Patient on medications for pulmonary hypertension which have fairly significant effect on lowering BP.   Pulmonary hypertension Continue Macitentan and Riociguat  All other chronic medical condition were stable during the hospitalization.  Patient seen by PT recommending [facility] PT/OT  On the day of the discharge the patient's vitals were stable, and no other acute medical condition were reported by patient. Patient was felt safe to be discharge to home      07/16/2017  f/u ov/Wert re: COPD/ chronic resp failure/ cor pulmonale on symbicort but unable to use effectively  Chief Complaint  Patient presents with  . Pulmonary Consult    SOB with exertion referred by Dr. Willey Blade  gradually downhill since  may of 2017 with worse breathing no cough increase 02= 2lpm   Doe = MMRC4  = sob if tries to leave home or while getting dressed    No obvious   day to day or daytime variabilty or assoc   cp or chest tightness, subjective wheeze overt sinus or hb symptoms. No unusual exp hx or h/o childhood pna/ asthma or knowledge of premature birth.  Sleeping ok on 2lpm without nocturnal  or early am exacerbation  of respiratory  c/o's or need for noct saba. Also denies any obvious fluctuation of symptoms with weather or environmental changes or other aggravating or alleviating factors  except as outlined above   Current Medications, Allergies, Complete Past Medical History, Past Surgical History, Family History, and Social History were reviewed in [employer] record.           Review of Systems  Constitutional: Negative for unexpected weight change.  HENT: Negative for congestion, dental problem, ear pain, nosebleeds, postnasal drip, rhinorrhea, sinus pressure, sneezing, sore throat and trouble swallowing.   Eyes: Negative for redness and itching.  Respiratory: Positive for shortness of breath. Negative for cough, chest tightness and wheezing.   Cardiovascular: Negative for palpitations and leg swelling.  Gastrointestinal: Negative for nausea and vomiting.  Genitourinary: Negative for dysuria.  Musculoskeletal: Negative for joint swelling.  Skin: Negative for rash.  Neurological: Negative for headaches.  Hematological: Does not bruise/bleed easily.  Psychiatric/Behavioral: Negative for dysphoric mood. The patient is not nervous/anxious.        Objective:   Physical Exam  W/c bound elderly bf unusual affect, freq laughs at inapprop times  Wt Readings from Last 3 Encounters:  07/16/17 131 lb 12.8 oz (59.8 kg)  06/07/17 137 lb 4 oz (62.3 kg)  04/13/17 143 lb 8 oz (65.1 kg)    Vital signs reviewed    HEENT: nl  dentition, turbinates bilaterally, and oropharynx. Nl external ear canals without cough reflex   NECK :  without JVD/Nodes/TM/ nl carotid upstrokes bilaterally   LUNGS: no acc muscle use, somewhat barrel chested with hyper-resonance to percussion and distant exp wheeze bilaterally s cough on insp/exp    CV:  RRR  no s3 or murmur or increase in P2, and no edema   ABD:  soft and nontender with nl inspiratory excursion in the supine position. No bruits or organomegaly appreciated, bowel sounds nl  MS:   ext warm without deformities, calf tenderness, cyanosis or clubbing No obvious joint restrictions   SKIN: warm and dry  without lesions    NEURO:  alert, approp, nl sensorium with  no motor or cerebellar deficits apparent.      I personally reviewed images and agree with radiology impression as follows:  CXR:  06/22/17 Vascular congestion and borderline cardiomegaly. Lungs remain grossly clear.   BNP  06/23/17  = 16  Lab Results  Component Value Date   WBC 6.2 06/23/2017   HGB 9.1 (L) 06/23/2017   HCT 29.7 (L) 06/23/2017   MCV 96.7 06/23/2017   PLT 196 06/23/2017         Assessment & Plan:

## 2017-07-16 NOTE — Patient Instructions (Addendum)
.  Plan A = Automatic = Trelegy one click each am - call if any problem acquiring   Plan B = Backup Only use your albuterol as a rescue medication to be used if you can't catch your breath by resting or doing a relaxed purse lip breathing pattern.  - The less you use it, the better it will work when you need it. - Ok to use the inhaler up to 2 puffs  every 4 hours if you must but call for appointment if use goes up over your usual need - Don't leave home without it !!  (think of it like the spare tire for your car)   Plan C = Crisis - only use your albuterol nebulizer if you first try Plan B and it fails to help > ok to use the nebulizer up to every 4 hours but if start needing it regularly call for immediate appointment   Please schedule a follow up office visit in 6 weeks, call sooner if needed with all medications /inhalers/ solutions in hand so we can verify exactly what you are taking. This includes all medications from all doctors and over the counters

## 2017-07-17 DIAGNOSIS — J9621 Acute and chronic respiratory failure with hypoxia: Secondary | ICD-10-CM | POA: Insufficient documentation

## 2017-07-17 DIAGNOSIS — J9611 Chronic respiratory failure with hypoxia: Secondary | ICD-10-CM | POA: Insufficient documentation

## 2017-07-17 DIAGNOSIS — J9612 Chronic respiratory failure with hypercapnia: Secondary | ICD-10-CM

## 2017-07-17 NOTE — Assessment & Plan Note (Addendum)
-  PFT's 06/30/2012 FEV1  0.93 (49%) ratio 49 and DLCO 47 corrects to 87   - 07/16/2017  After extensive coaching HFA effectiveness =    0% but 90% with dpi > trial of trelegy  - Spirometry 07/16/2017  FEV1 0.57 (38%)  Ratio 54 with atypical f/v with no rx prior    DDX of  difficult airways management almost all start with A and  include Adherence, Ace Inhibitors, Acid Reflux, Active Sinus Disease, Alpha 1 Antitripsin deficiency, Anxiety masquerading as Airways dz,  ABPA,  Allergy(esp in young), Aspiration (esp in elderly), Adverse effects of meds,  Active smokers, A bunch of PE's (a small clot burden can't cause this syndrome unless there is already severe underlying pulm or vascular dz with poor reserve) plus two Bs  = Bronchiectasis and Beta blocker use..and one C= CHF  Adherence is always the initial "prime suspect" and is a multilayered concern that requires a "trust but verify" approach in every patient - starting with knowing how to use medications, especially inhalers, correctly, keeping up with refills and understanding the fundamental difference between maintenance and prns vs those medications only taken for a very short course and then stopped and not refilled.  - see hfa attempts > change to dpi if insurance will cover - Formulary restrictions will be an ongoing challenge for the forseable future and I would be happy to pick an alternative if the pt will first  provide me a list of them but pt  will need to return here for training for any new device that is required eg dpi vs hfa vs respimat.    In meantime we can always provide samples so the patient never runs out of any needed respiratory medications.    ? Allergy/asthmatic component > trelegy should cover/ unable to use hfa so need to change to proair respiclick as "plan B " and neb as "plan C"  see avs for instructions unique to this ov  ? Active sinus dz > neg MRI sinus 06/23/17 rules out   ? CHF > predominantly cor pulmonale (see  separate a/p)    Total time devoted to counseling  > 50 % of initial 60 min office visit:  review case with pt/fm discussion of options/alternatives/ personally creating written customized instructions  in presence of pt  then going over those specific  Instructions directly with the pt including how to use all of the meds but in particular covering each new medication in detail and the difference between the maintenance= "automatic" meds and the prns using an action plan format for the latter (If this problem/symptom => do that organization reading Left to right).  Please see AVS from this visit for a full list of these instructions which I personally wrote for this pt and  are unique to this visit.   Marland Kitchen

## 2017-07-17 NOTE — Assessment & Plan Note (Signed)
HC03  06/23/17 = 31  But as high as 35 one month prior  Adequate control on present rx, reviewed in detail with pt > no change in rx needed  With goal to keep sats at least low 90's at all times

## 2017-07-17 NOTE — Assessment & Plan Note (Addendum)
  Lab Results  Component Value Date   HGB 9.1 (L) 06/23/2017   HGB 10.9 (L) 06/22/2017   HGB 9.6 (L) 06/22/2017     Not an acute issue  but needs to be monitored in setting of PH/ limited CO reserve and chronic resp failure

## 2017-07-17 NOTE — Assessment & Plan Note (Addendum)
See [facility]  03/23/16 with PVR = 15 but pcwp 20  See Echo 06/07/17 - Normal LV size with EF 60-65%. Mildly dilated RV with normal   systolic function. D-shaped intraventricular septum suggesting a   degree of RV pressure/volume overload. Mild mitral regurgitation.   Mild aortic stenosis (though not fully interrogated). Mild   pulmonary hypertension. IVC not dilated.   This is almost certainly cor pulmonale though on PAH rx per cards > all adjustments per cards

## 2017-07-22 ENCOUNTER — Ambulatory Visit (HOSPITAL_COMMUNITY)
Admission: RE | Admit: 2017-07-22 | Discharge: 2017-07-22 | Disposition: A | Discharge status: Home / Self Care | Payer: Medicare HMO | Source: Ambulatory Visit | Attending: Cardiology | Admitting: Cardiology

## 2017-07-22 ENCOUNTER — Encounter (HOSPITAL_COMMUNITY): Payer: Self-pay | Admitting: Cardiology

## 2017-07-22 VITALS — BP 146/76 | HR 84 | Wt 131.0 lb

## 2017-07-22 DIAGNOSIS — N189 Chronic kidney disease, unspecified: Secondary | ICD-10-CM | POA: Insufficient documentation

## 2017-07-22 DIAGNOSIS — Z8673 Personal history of transient ischemic attack (TIA), and cerebral infarction without residual deficits: Secondary | ICD-10-CM | POA: Diagnosis not present

## 2017-07-22 DIAGNOSIS — E785 Hyperlipidemia, unspecified: Secondary | ICD-10-CM | POA: Insufficient documentation

## 2017-07-22 DIAGNOSIS — Z7982 Long term (current) use of aspirin: Secondary | ICD-10-CM | POA: Insufficient documentation

## 2017-07-22 DIAGNOSIS — Z7984 Long term (current) use of oral hypoglycemic drugs: Secondary | ICD-10-CM | POA: Insufficient documentation

## 2017-07-22 DIAGNOSIS — I471 Supraventricular tachycardia: Secondary | ICD-10-CM | POA: Diagnosis not present

## 2017-07-22 DIAGNOSIS — Z87891 Personal history of nicotine dependence: Secondary | ICD-10-CM | POA: Insufficient documentation

## 2017-07-22 DIAGNOSIS — E1122 Type 2 diabetes mellitus with diabetic chronic kidney disease: Secondary | ICD-10-CM | POA: Diagnosis not present

## 2017-07-22 DIAGNOSIS — G4733 Obstructive sleep apnea (adult) (pediatric): Secondary | ICD-10-CM | POA: Insufficient documentation

## 2017-07-22 DIAGNOSIS — Z9981 Dependence on supplemental oxygen: Secondary | ICD-10-CM | POA: Insufficient documentation

## 2017-07-22 DIAGNOSIS — J449 Chronic obstructive pulmonary disease, unspecified: Secondary | ICD-10-CM | POA: Diagnosis not present

## 2017-07-22 DIAGNOSIS — I5032 Chronic diastolic (congestive) heart failure: Secondary | ICD-10-CM | POA: Diagnosis present

## 2017-07-22 DIAGNOSIS — I272 Pulmonary hypertension, unspecified: Secondary | ICD-10-CM | POA: Diagnosis not present

## 2017-07-22 DIAGNOSIS — I130 Hypertensive heart and chronic kidney disease with heart failure and stage 1 through stage 4 chronic kidney disease, or unspecified chronic kidney disease: Secondary | ICD-10-CM | POA: Insufficient documentation

## 2017-07-22 DIAGNOSIS — Z8249 Family history of ischemic heart disease and other diseases of the circulatory system: Secondary | ICD-10-CM | POA: Insufficient documentation

## 2017-07-22 LAB — BRAIN NATRIURETIC PEPTIDE: B Natriuretic Peptide: 8.7 pg/mL (ref 0.0–100.0)

## 2017-07-22 NOTE — Patient Instructions (Signed)
Labs today  Your physician recommends that you schedule a follow-up appointment in: 2 month

## 2017-07-25 NOTE — Progress Notes (Signed)
Patient ID: Beth [name], female   DOB: Feb 22, 1941, 76 y.o.   MRN: [id number] PCP: Dr Karlton Lemon Cardiology: Dr. Aundra Dubin  76 yo with history of COPD on home oxygen, untreated OSA, chronic diastolic CHF/RV failure, and pulmonary hypertension presents for cardiology followup.  She was admitted in 5/17 with acute on chronic diastolic CHF with prominent RV failure and marked shortness of breath.  She was diuresed with IV Lasix.  Echo showed dilated and dysfunctional RV with pulmonary hypertension.  RHC showed marked pulmonary hypertension with systemic PA pressure.  She was started on Revatio in the hospital and this was titrated up.  Subsequently, she was started on macitentan.  She next started Selexipag but she had intractable side effects with selexipag so stopped it.  I then tried her on Tyvaso, but she has developed abdominal discomfort and diarrhea with Tyvaso and has had to stop it (symptomatic even at lowest dose). She did not feel like it helped her breathing when she was on it.  She has now stopped sildenafil and started riociguat.   She was admitted in 8/18 with hypotension and AKI.  Valsartan and HCTZ stopped.     Weight is down 6 lbs today.  SBP has been < 140 when she checks at home.  She is feeling ok, not short of breath walking in the house.  Will be increasing Adempas soon.  No lightheadedness or syncope.  No chest pain.    6 minute walk (7/17): 238 m 6 minute walk (10/17): 171 m 6 minute walk (3/18): 231 m  Labs (5/17): K 4.7 => 4, creatinine 0.96 => 1.04 Labs (8/17): K 4.1, creatinine 1.5 Labs (9/17): K 3.5, creatinine 1.13 Labs (10/17): K 3.6, creatinine 1.0 Labs (1/18): K 3.9, creatinine 1.14, BNP 34 Labs (2/18): K 4.1, creatinine 1.07, BNP 25 Labs (3/18): K 3.8, creatinine 1.06, BNP 25 Labs (8/18): K 4.6, creatinine 1.38, hgb 9.1  ECG (personally reviewed): sinus tachy, RBBB  PMH: 1. H/o CVA 2. DM 3. HTN 4. Hyperlipidemia 5. OSA: Cannot tolerate CPAP, has tried multiple  masks.  6. COPD: She is on home oxygen. 7. SVT 8. Chronic diastolic CHF with RV failure: Echo (5/17) with EF 55%, severely dilated RV with moderately decreased systolic function, D-shaped interventricular septum.  - Echo (7/18): EF 60-65%, D-shaped interventricular septum, mild RV dilation with normal systolic function, PASP 41, IVC normal, mild MR, mild AS.  9. 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  V/Q scan 5/17 with no evidence for chronic PE.  ANA weakly positive (1:80), ANCA negative, RF negative.  RHC (5/17) with mean RA 20, PA 110/45 mean 66, mean PCWP 20, CI 1.7, PVR 15 WU.  - Intractable side effects with selexipag.  - Did not tolerate Tyvaso 10. CKD  Social History   Social History  . Marital status: Widowed    Spouse name: N/A  . Number of children: 2  . Years of education: N/A   Occupational History  . retired International aid/development worker [employer]   Social History Main Topics  . Smoking status: Former Smoker    Packs/day: 1.50    Years: 40.00    Types: Cigarettes    Quit date: 11/16/2004  . Smokeless tobacco: Former Systems developer  . Alcohol use No     Comment: occassional  . Drug use: No  . Sexual activity: Not on file   Other Topics Concern  .  Not on file   Social History Narrative  . No narrative on file   Family History  Problem Relation Age of Onset  . Heart disease Mother   . Heart failure Mother   . Heart disease Brother   . Heart disease Brother   . Heart disease Sister   . Heart disease Sister   . Heart failure Father    ROS: All systems reviewed and negative except as per HPI.   Current Outpatient Prescriptions  Medication Sig Dispense Refill  . albuterol (ACCUNEB) 0.63 MG/3ML nebulizer solution Take 1 ampule by nebulization every 6 (six) hours as needed for wheezing.    Marland Kitchen albuterol (PROAIR HFA) 108 (90 BASE) MCG/ACT inhaler 2 puffs every 4 hours as needed only   if your can't catch your breath 1 Inhaler 11  . allopurinol (ZYLOPRIM) 300 MG tablet Take 300 mg by mouth daily.    . ASPIRIN ADULT LOW STRENGTH 81 MG EC tablet TAKE 1 TABLET BY MOUTH EVERY DAY 90 tablet 3  . atorvastatin (LIPITOR) 40 MG tablet Take 40 mg by mouth daily at 6 PM.    . b complex vitamins tablet Take 1 tablet by mouth daily.    . Cholecalciferol (VITAMIN D3) 1000 units CAPS Take 1,000 Units by mouth daily.    Marland Kitchen co-enzyme Q-10 30 MG capsule Take 30 mg by mouth daily.    . Fluticasone-Umeclidin-Vilant (TRELEGY ELLIPTA) 100-62.5-25 MCG/INH AEPB Inhale 1 puff into the lungs 2 (two) times daily. 2 each 0  . furosemide (LASIX) 40 MG tablet Take 40-80 mg by mouth as directed. Take 67m in the AM and 453min the PM    . loratadine (CLARITIN) 10 MG tablet Take 10 mg by mouth daily.    . Macitentan (OPSUMIT) 10 MG TABS Take 1 tablet (10 mg total) by mouth daily. 30 tablet 11  . metFORMIN (GLUCOPHAGE) 500 MG tablet Take 500 mg by mouth 2 (two) times daily with a meal.    . Omega 3 1000 MG CAPS Take 1 capsule by mouth daily.    . Marland Kitchenmeprazole (PRILOSEC) 40 MG capsule Take 40 mg by mouth daily.    . OXYGEN Inhale 2 L into the lungs continuous.    . Potassium Chloride ER 20 MEQ TBCR Take 20 mEq by mouth daily. 90 tablet 3  . Riociguat (ADEMPAS) 0.5 MG TABS Take 1 tablet by mouth 3 (three) times daily.     No current facility-administered medications for this encounter.    BP (!) 146/76   Pulse 84   Wt 131 lb (59.4 kg)   SpO2 95% Comment: on 2L of O2  BMI 23.96 kg/m    Wt Readings from Last 3 Encounters:  07/22/17 131 lb (59.4 kg)  07/16/17 131 lb 12.8 oz (59.8 kg)  06/07/17 137 lb 4 oz (62.3 kg)   General: NAD Neck: No JVD, no thyromegaly or thyroid nodule.  Lungs: Mildly decreased throughout CV: Nondisplaced PMI.  Heart regular S1/S2 (widely split S2), no S3/S4, no murmur.  No peripheral edema.  No carotid bruit.  Normal pedal pulses.  Abdomen: Soft, nontender, no  hepatosplenomegaly, no distention.  Skin: Intact without lesions or rashes.  Neurologic: Alert and oriented x 3.  Psych: Normal affect. Extremities: No clubbing or cyanosis.  HEENT: Normal.   Assessment/Plan: 1. 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 She was very short of breath with exertion and had  very elevated right-sided filling pressures on 5/17 RHC.  PA pressure was systemic. Cardiac output low, but not moving towards IV Flolan given the mixed etiology of her PH. V/Q scan not suggestive of chronic PEs and autoimmune serologies sent (RF negative, ANCA negative, ANA only weakly positive (1:80).She has been unable to tolerate selexipag or Tyvaso. Echo in 7/18 appeared to show some improvement in RV function.  - We recently transitioned from sildenafil to riociguat.  Continue to titrate up riociguat.  - Continue Opsumit 10 mg daily.  - 6 minute walk deferred to next appointment per patient's request.   2. COPD: On home oxygen.  Prior long-time smoker.  3. Chronic diastolic CHF with prominent RV failure: Volume status looks ok on exam today. - Continue Lasix 80 mg qam and 40 mg qpm.      - BMET/BNP today.  4. SVT: No palpitations.   5. OSA: Has been unable to tolerate CPAP.  She uses oxygen at night.  OSA may have small contribution to [facility] but does not explain the extent of PAH.   Followup in 2 months.   Loralie Champagne 07/25/2017

## 2017-07-28 ENCOUNTER — Other Ambulatory Visit (HOSPITAL_COMMUNITY): Payer: Self-pay | Admitting: Cardiology

## 2017-07-28 DIAGNOSIS — J810 Acute pulmonary edema: Secondary | ICD-10-CM

## 2017-07-28 MED ORDER — MACITENTAN 10 MG PO TABS: 10.0000 mg | ORAL_TABLET | Freq: Every day | ORAL | 11 refills | Status: DC

## 2017-08-01 ENCOUNTER — Other Ambulatory Visit (HOSPITAL_COMMUNITY): Payer: Self-pay | Admitting: Cardiology

## 2017-08-01 DIAGNOSIS — I509 Heart failure, unspecified: Secondary | ICD-10-CM

## 2017-08-09 ENCOUNTER — Telehealth (HOSPITAL_COMMUNITY): Payer: Self-pay | Admitting: *Deleted

## 2017-08-09 NOTE — Telephone Encounter (Signed)
Have her stop Adempas and see if the swelling resolves.

## 2017-08-09 NOTE — Telephone Encounter (Signed)
Advanced Heart Failure Triage Encounter  Patient Name: Beth [name]   Date of Call: 08/09/2017  Problem:  Patient called c/o swelling in both hands. Patient states the swelling began when she started Adempas and it has continued to worsen. Patient said the swelling is so bad she can't open a bottle of water.  Patient held the medication for one day and saw a slight improvement in swelling.  I spoke to [facility] (Clinical Pharmacist)  and she said peripheral edema is a side effect of Adempas. Patient is on the lowest dose of medication.   Message routed to [facility] for further advice.   Plan:    Harvie Junior, CMA

## 2017-08-10 ENCOUNTER — Encounter: Payer: Medicare HMO | Admitting: Adult Health

## 2017-08-10 NOTE — Telephone Encounter (Signed)
Pt aware and agreeable.

## 2017-08-13 ENCOUNTER — Encounter: Payer: Medicare HMO | Admitting: Adult Health

## 2017-08-16 NOTE — Telephone Encounter (Signed)
Patient called back and said that her swelling has come down a lot since she stopped Adempas.  Will send to Dr. Aundra Dubin.

## 2017-08-16 NOTE — Telephone Encounter (Signed)
Will let her stay off Adempas.

## 2017-08-16 NOTE — Telephone Encounter (Signed)
Called patient back and she will remain off Adempas.  No further questions.

## 2017-08-24 ENCOUNTER — Other Ambulatory Visit (HOSPITAL_COMMUNITY): Payer: Self-pay | Admitting: Cardiology

## 2017-08-24 DIAGNOSIS — J810 Acute pulmonary edema: Secondary | ICD-10-CM

## 2017-08-24 MED ORDER — MACITENTAN 10 MG PO TABS: 10.0000 mg | ORAL_TABLET | Freq: Every day | ORAL | 11 refills | Status: DC

## 2017-08-24 NOTE — Telephone Encounter (Signed)
As requested rx faxed to [facility] [phone]

## 2017-08-25 ENCOUNTER — Ambulatory Visit (INDEPENDENT_AMBULATORY_CARE_PROVIDER_SITE_OTHER): Payer: Medicare HMO | Admitting: Adult Health

## 2017-08-25 ENCOUNTER — Encounter: Payer: Medicare HMO | Admitting: Adult Health

## 2017-08-25 ENCOUNTER — Encounter: Payer: Self-pay | Admitting: Adult Health

## 2017-08-25 DIAGNOSIS — Z23 Encounter for immunization: Secondary | ICD-10-CM

## 2017-08-25 DIAGNOSIS — J9611 Chronic respiratory failure with hypoxia: Secondary | ICD-10-CM | POA: Diagnosis not present

## 2017-08-25 DIAGNOSIS — J9612 Chronic respiratory failure with hypercapnia: Secondary | ICD-10-CM | POA: Diagnosis not present

## 2017-08-25 DIAGNOSIS — J449 Chronic obstructive pulmonary disease, unspecified: Secondary | ICD-10-CM | POA: Diagnosis not present

## 2017-08-25 MED ORDER — FLUTICASONE-UMECLIDIN-VILANT 100-62.5-25 MCG/INH IN AEPB
1.0000 | INHALATION_SPRAY | Freq: Every day | RESPIRATORY_TRACT | 0 refills | Status: DC
Start: 2017-08-25 — End: 2018-09-06

## 2017-08-25 MED ORDER — FLUTICASONE-UMECLIDIN-VILANT 100-62.5-25 MCG/INH IN AEPB: 1.0000 | INHALATION_SPRAY | Freq: Every day | RESPIRATORY_TRACT | 5 refills | Status: DC

## 2017-08-25 NOTE — Assessment & Plan Note (Signed)
Cont on O2 , goal to keep O2 sats >88-90%.   Plan  Patient Instructions  Continue on TRELEGY 1 PUFF DAILY . Rinse after use.  Flu shot today .  Continue on Oxygen 2l/m .  Follow up with Dr. Melvyn Novas  In 4 months and As needed  .

## 2017-08-25 NOTE — Patient Instructions (Addendum)
Continue on TRELEGY 1 PUFF DAILY . Rinse after use.  Flu shot today .  Continue on Oxygen 2l/m .  Follow up with Dr. Melvyn Novas  In 4 months and As needed  .

## 2017-08-25 NOTE — Progress Notes (Signed)
_0  ID: Beth [name], female    DOB: 1940-12-11, 76 y.o.   MRN: [id number]  Chief Complaint  Patient presents with  . 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   Marland KitchenTEST  - PFT's 06/30/2012 FEV1  0.93 (49%) ratio 49 and DLCO 47 corrects to 87    Spirometry 07/16/2017  FEV1 0.57 (38%)  Ratio 54 with atypical f/v with no rx prior  -Echo 05/2017 EF 60-65%, PAP 41 mm Hg  -CXR 06/2017 Clear   08/25/2017 Follow up : COPD and O2 RF  Pt returns for 6 week follow up . Seen last ov ,  Started on TRELEGY . She feels this is helping her breathing . Has less dyspnea. No flare of cough or wheezing .  Went over her inhaler use , rx was sent as Twice daily  Dosing for TRELEGY . We corrected this to daily dosing. She denies any palpitations..  She remains on Oxygen 2l/m .  She would like to get flu shot today . PVX is utd.      Allergies  Allergen Reactions  . Aldomet [Methyldopa] Hives  . Tyvaso [Treprostinil] Diarrhea    Immunization History  Administered Date(s) Administered  . Influenza Split 08/17/2011  . Influenza Whole 11/17/2012  . Influenza, High Dose Seasonal PF 08/16/2016, 08/25/2017  . Pneumococcal Polysaccharide-23 08/17/2011    Past Medical History:  Diagnosis Date  . Arthritis   . Asthma   . CHF (congestive heart failure) ([place])   . CKD (chronic kidney disease)   . COPD (chronic obstructive pulmonary disease) ([place])    dR. wERT  . Diabetes mellitus ([place])    type 2  . Gout   . Hyperlipidemia   . Hypertension   . Shortness of breath    WITH EXERTION   . Sleep apnea    USES  CPAP   . Stroke [facility])     Tobacco History: History  Smoking Status  . Former Smoker  . Packs/day: 1.50  .  Years: 40.00  . Types: Cigarettes  . Quit date: 11/16/2004  Smokeless Tobacco  . Former Systems developer   Counseling given: Not Answered   Outpatient Encounter Prescriptions as of 08/25/2017  Medication Sig  . albuterol (PROAIR HFA) 108 (90 BASE) MCG/ACT inhaler 2 puffs every 4 hours as needed only  if your can't catch your breath  . albuterol (PROVENTIL) (2.5 MG/3ML) 0.083% nebulizer solution Take 2.5 mg by nebulization every 6 (six) hours as needed for wheezing or shortness of breath.  . allopurinol (ZYLOPRIM) 300 MG tablet Take 300 mg by mouth daily.  . ASPIRIN ADULT LOW STRENGTH 81 MG EC tablet TAKE 1 TABLET BY MOUTH EVERY DAY  . atorvastatin (LIPITOR) 40 MG tablet Take 40 mg by mouth daily at 6 PM.  . b complex vitamins tablet Take 1 tablet by mouth daily.  . Cholecalciferol (VITAMIN D3) 1000 units CAPS Take 1,000 Units by mouth daily.  Marland Kitchen co-enzyme Q-10 30 MG capsule Take 30 mg by mouth daily.  . Fluticasone-Umeclidin-Vilant (TRELEGY ELLIPTA) 100-62.5-25 MCG/INH AEPB Inhale 1 puff into the lungs daily.  . furosemide (LASIX) 20 MG tablet Take 2-4 tablets (40-80 mg total) by mouth  as directed. Take 80 mg in the am and 40 mg in the PM  . Macitentan (OPSUMIT) 10 MG TABS Take 1 tablet (10 mg total) by mouth daily.  . metFORMIN (GLUCOPHAGE) 500 MG tablet Take 500 mg by mouth 2 (two) times daily with a meal.  . Omega 3 1000 MG CAPS Take 1 capsule by mouth daily.  Marland Kitchen omeprazole (PRILOSEC) 40 MG capsule Take 40 mg by mouth daily.  . OXYGEN Inhale 2 L into the lungs continuous.  . Potassium Chloride ER 20 MEQ TBCR Take 20 mEq by mouth daily.  . [DISCONTINUED] Fluticasone-Umeclidin-Vilant (TRELEGY ELLIPTA) 100-62.5-25 MCG/INH AEPB Inhale 1 puff into the lungs 2 (two) times daily.  . Fluticasone-Umeclidin-Vilant (TRELEGY ELLIPTA) 100-62.5-25 MCG/INH AEPB Inhale 1 puff into the lungs daily.  Marland Kitchen loratadine (CLARITIN) 10 MG tablet Take 10 mg by mouth daily.  . [DISCONTINUED] albuterol (ACCUNEB) 0.63 MG/3ML  nebulizer solution Take 1 ampule by nebulization every 6 (six) hours as needed for wheezing.  . [DISCONTINUED] furosemide (LASIX) 40 MG tablet Take 40-80 mg by mouth as directed. Take 64m in the AM and 465min the PM   No facility-administered encounter medications on file as of 08/25/2017.      Review of Systems  Constitutional:   No  weight loss, night sweats,  Fevers, chills, fatigue, or  lassitude.  HEENT:   No headaches,  Difficulty swallowing,  Tooth/dental problems, or  Sore throat,                No sneezing, itching, ear ache, nasal congestion, post nasal drip,   CV:  No chest pain,  Orthopnea, PND, swelling in lower extremities, anasarca, dizziness, palpitations, syncope.   GI  No heartburn, indigestion, abdominal pain, nausea, vomiting, diarrhea, change in bowel habits, loss of appetite, bloody stools.   Resp:.  No chest wall deformity  Skin: no rash or lesions.  GU: no dysuria, change in color of urine, no urgency or frequency.  No flank pain, no hematuria   MS:  No joint pain or swelling.  No decreased range of motion.  No back pain.    Physical Exam  BP 122/76 (BP Location: Left Arm, Cuff Size: Normal)   Pulse (!) 102   Ht _0  (1.575 m)   Wt 125 lb 3.2 oz (56.8 kg)   SpO2 99%   BMI 22.90 kg/m   GEN: A/Ox3; pleasant , NAD, chronically ill appearing , on O2 in wc    HEENT:  [place]/AT,  EACs-clear, TMs-wnl, NOSE-clear, THROAT-clear, no lesions, no postnasal drip or exudate noted.   NECK:  Supple w/ fair ROM; no JVD; normal carotid impulses w/o bruits; no thyromegaly or nodules palpated; no lymphadenopathy.    RESP  Decreased BS in bases  no accessory muscle use, no dullness to percussion  CARD:  RRR, no m/r/g, tr peripheral edema, pulses intact, no cyanosis or clubbing.  GI:   Soft & nt; nml bowel sounds; no organomegaly or masses detected.   Musco: Warm bil, no deformities or joint swelling noted.   Neuro: alert, no focal deficits noted.    Skin: Warm,  no lesions or rashes    Lab Results:   BMET  BNP   Imaging: No results found.   Assessment & Plan:   COPD  GOLD III/ 02 dep  Improved symptom control on TRELEGY   Plan  Patient Instructions  Continue on TRELEGY 1 PUFF DAILY . Rinse after use.  Flu shot today .  Continue on  Oxygen 2l/m .  Follow up with Dr. Melvyn Novas  In 4 months and As needed  .      Chronic respiratory failure with hypoxia and hypercapnia (HCC) Cont on O2 , goal to keep O2 sats >88-90%.   Plan  Patient Instructions  Continue on TRELEGY 1 PUFF DAILY . Rinse after use.  Flu shot today .  Continue on Oxygen 2l/m .  Follow up with Dr. Melvyn Novas  In 4 months and As needed  .         Rexene Edison, NP 08/25/2017

## 2017-08-25 NOTE — Assessment & Plan Note (Addendum)
Improved symptom control on TRELEGY  TRELEGY dose verified and rx corrected at pharmacy .   Plan  Patient Instructions  Continue on TRELEGY 1 PUFF DAILY . Rinse after use.  Flu shot today .  Continue on Oxygen 2l/m .  Follow up with Dr. Melvyn Novas  In 4 months and As needed  .

## 2017-08-26 NOTE — Progress Notes (Signed)
Chart and office note reviewed in detail  > agree with a/p as outlined

## 2017-09-24 ENCOUNTER — Ambulatory Visit (HOSPITAL_COMMUNITY)
Admission: RE | Admit: 2017-09-24 | Discharge: 2017-09-24 | Disposition: A | Discharge status: Home / Self Care | Payer: Medicare HMO | Source: Ambulatory Visit | Attending: Cardiology | Admitting: Cardiology

## 2017-09-24 VITALS — BP 152/80 | HR 104 | Wt 126.1 lb

## 2017-09-24 DIAGNOSIS — G4733 Obstructive sleep apnea (adult) (pediatric): Secondary | ICD-10-CM | POA: Diagnosis not present

## 2017-09-24 DIAGNOSIS — I272 Pulmonary hypertension, unspecified: Secondary | ICD-10-CM | POA: Insufficient documentation

## 2017-09-24 DIAGNOSIS — E1122 Type 2 diabetes mellitus with diabetic chronic kidney disease: Secondary | ICD-10-CM | POA: Diagnosis not present

## 2017-09-24 DIAGNOSIS — Z7984 Long term (current) use of oral hypoglycemic drugs: Secondary | ICD-10-CM | POA: Diagnosis not present

## 2017-09-24 DIAGNOSIS — Z8673 Personal history of transient ischemic attack (TIA), and cerebral infarction without residual deficits: Secondary | ICD-10-CM | POA: Insufficient documentation

## 2017-09-24 DIAGNOSIS — I4510 Unspecified right bundle-branch block: Secondary | ICD-10-CM | POA: Diagnosis not present

## 2017-09-24 DIAGNOSIS — N189 Chronic kidney disease, unspecified: Secondary | ICD-10-CM | POA: Diagnosis not present

## 2017-09-24 DIAGNOSIS — Z8249 Family history of ischemic heart disease and other diseases of the circulatory system: Secondary | ICD-10-CM | POA: Insufficient documentation

## 2017-09-24 DIAGNOSIS — I5032 Chronic diastolic (congestive) heart failure: Secondary | ICD-10-CM | POA: Insufficient documentation

## 2017-09-24 DIAGNOSIS — I130 Hypertensive heart and chronic kidney disease with heart failure and stage 1 through stage 4 chronic kidney disease, or unspecified chronic kidney disease: Secondary | ICD-10-CM | POA: Insufficient documentation

## 2017-09-24 DIAGNOSIS — J449 Chronic obstructive pulmonary disease, unspecified: Secondary | ICD-10-CM | POA: Insufficient documentation

## 2017-09-24 DIAGNOSIS — Z79899 Other long term (current) drug therapy: Secondary | ICD-10-CM | POA: Insufficient documentation

## 2017-09-24 DIAGNOSIS — Z9981 Dependence on supplemental oxygen: Secondary | ICD-10-CM | POA: Insufficient documentation

## 2017-09-24 DIAGNOSIS — Z7982 Long term (current) use of aspirin: Secondary | ICD-10-CM | POA: Diagnosis not present

## 2017-09-24 DIAGNOSIS — Z87891 Personal history of nicotine dependence: Secondary | ICD-10-CM | POA: Diagnosis not present

## 2017-09-24 DIAGNOSIS — I471 Supraventricular tachycardia: Secondary | ICD-10-CM | POA: Insufficient documentation

## 2017-09-24 DIAGNOSIS — E785 Hyperlipidemia, unspecified: Secondary | ICD-10-CM | POA: Diagnosis not present

## 2017-09-24 LAB — BASIC METABOLIC PANEL
ANION GAP: 10 (ref 5–15)
BUN: 14 mg/dL (ref 6–20)
CALCIUM: 9 mg/dL (ref 8.9–10.3)
CHLORIDE: 99 mmol/L — AB (ref 101–111)
CO2: 29 mmol/L (ref 22–32)
Creatinine, Ser: 0.65 mg/dL (ref 0.44–1.00)
GFR calc non Af Amer: >60 mL/min (ref >60)
Glucose, Bld: 117 mg/dL — ABNORMAL HIGH (ref 65–99)
Potassium: 3.6 mmol/L (ref 3.5–5.1)
Sodium: 138 mmol/L (ref 135–145)

## 2017-09-24 LAB — BRAIN NATRIURETIC PEPTIDE: B Natriuretic Peptide: 35.5 pg/mL (ref 0.0–100.0)

## 2017-09-24 MED ORDER — SILDENAFIL CITRATE 20 MG PO TABS: 20.0000 mg | ORAL_TABLET | Freq: Three times a day (TID) | ORAL | 3 refills | Status: DC

## 2017-09-24 NOTE — Progress Notes (Signed)
Patient ID: Beth [name], female   DOB: 12-08-1940, 76 y.o.   MRN: [id number] PCP: Dr Karlton Lemon Cardiology: Dr. Aundra Dubin  76 yo with history of COPD on home oxygen, untreated OSA, chronic diastolic CHF/RV failure, and pulmonary hypertension returns for followup of pulmonary hypertension and diastolic CHF.  She was admitted in 5/17 with acute on chronic diastolic CHF with prominent RV failure and marked shortness of breath.  She was diuresed with IV Lasix.  Echo showed dilated and dysfunctional RV with pulmonary hypertension.  RHC showed marked pulmonary hypertension with systemic PA pressure.  She was started on Revatio in the hospital and this was titrated up.  Subsequently, she was started on macitentan.  She next started Selexipag but she had intractable side effects with selexipag so stopped it.  I then tried her on Tyvaso, but she has developed abdominal discomfort and diarrhea with Tyvaso and has had to stop it (symptomatic even at lowest dose). She did not feel like it helped her breathing when she was on it.  She stopped sildenafil and started riociguat. However, she developed painful hand swelling on riociguat and has had to stop it.  Symptoms resolved off riociguat.    She was admitted in 8/18 with hypotension and AKI.  Valsartan and HCTZ stopped.     Weight is down 5 lbs today.  BP is high but SBP has been < 140 when she checks at home.  Stable breathing, using her home oxygen.  Follows with Dr. Melvyn Novas for pulmonology, started recently on Trelegy.  She is short of breath after walking 100 feet.  No chest pain, no orthopnea/PND.  6 minute walk today is considerably worse than in the past.    6 minute walk (7/17): 238 m 6 minute walk (10/17): 171 m 6 minute walk (3/18): 231 m 6 minute walk (11/18): 123 m  Labs (5/17): K 4.7 => 4, creatinine 0.96 => 1.04 Labs (8/17): K 4.1, creatinine 1.5 Labs (9/17): K 3.5, creatinine 1.13 Labs (10/17): K 3.6, creatinine 1.0 Labs (1/18): K 3.9, creatinine 1.14,  BNP 34 Labs (2/18): K 4.1, creatinine 1.07, BNP 25 Labs (3/18): K 3.8, creatinine 1.06, BNP 25 Labs (8/18): K 4.6, creatinine 1.38, hgb 9.1 Labs (9/18): BNP 8.7  ECG (personally reviewed): sinus tachy at 106, RBBB  PMH: 1. H/o CVA 2. DM 3. HTN 4. Hyperlipidemia 5. OSA: Cannot tolerate CPAP, has tried multiple masks.  6. COPD: She is on home oxygen. 7. SVT 8. Chronic diastolic CHF with RV failure: Echo (5/17) with EF 55%, severely dilated RV with moderately decreased systolic function, D-shaped interventricular septum.  - Echo (7/18): EF 60-65%, D-shaped interventricular septum, mild RV dilation with normal systolic function, PASP 41, IVC normal, mild MR, mild AS.  9. 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  V/Q scan 5/17 with no evidence for chronic PE.  ANA weakly positive (1:80), ANCA negative, RF negative.  RHC (5/17) with mean RA 20, PA 110/45 mean 66, mean PCWP 20, CI 1.7, PVR 15 WU.  - Intractable side effects with selexipag.  - Did not tolerate Tyvaso - Did not tolerate riociguat.  10. CKD  Social History   Socioeconomic History  . Marital status: Widowed    Spouse name: Not on file  . Number of children: 2  . Years of education: Not on file  . Highest education level: Not on file  Social Needs  .  Financial resource strain: Not on file  . Food insecurity - worry: Not on file  . Food insecurity - inability: Not on file  . Transportation needs - medical: Not on file  . Transportation needs - non-medical: Not on file  Occupational History  . Occupation: retired    Fish farm manager: OTHER    Comment: [employer]  Tobacco Use  . Smoking status: Former Smoker    Packs/day: 1.50    Years: 40.00    Pack years: 60.00    Types: Cigarettes    Last attempt to quit: 11/16/2004    Years since quitting: 12.8  . Smokeless tobacco: Former Network engineer and Sexual Activity  .  Alcohol use: No    Comment: occassional  . Drug use: No  . Sexual activity: Not on file  Other Topics Concern  . Not on file  Social History Narrative  . Not on file   Family History  Problem Relation Age of Onset  . Heart disease Mother   . Heart failure Mother   . Heart disease Brother   . Heart disease Brother   . Heart disease Sister   . Heart disease Sister   . Heart failure Father    ROS: All systems reviewed and negative except as per HPI.   Current Outpatient Medications  Medication Sig Dispense Refill  . albuterol (PROAIR HFA) 108 (90 BASE) MCG/ACT inhaler 2 puffs every 4 hours as needed only  if your can't catch your breath 1 Inhaler 11  . albuterol (PROVENTIL) (2.5 MG/3ML) 0.083% nebulizer solution Take 2.5 mg by nebulization every 6 (six) hours as needed for wheezing or shortness of breath.    . allopurinol (ZYLOPRIM) 300 MG tablet Take 300 mg by mouth daily.    . ASPIRIN ADULT LOW STRENGTH 81 MG EC tablet TAKE 1 TABLET BY MOUTH EVERY DAY 90 tablet 3  . atorvastatin (LIPITOR) 40 MG tablet Take 40 mg by mouth daily at 6 PM.    . b complex vitamins tablet Take 1 tablet by mouth daily.    . Cholecalciferol (VITAMIN D3) 1000 units CAPS Take 1,000 Units by mouth daily.    Marland Kitchen co-enzyme Q-10 30 MG capsule Take 30 mg by mouth daily.    . Fluticasone-Umeclidin-Vilant (TRELEGY ELLIPTA) 100-62.5-25 MCG/INH AEPB Inhale 1 puff into the lungs daily. 1 each 5  . Fluticasone-Umeclidin-Vilant (TRELEGY ELLIPTA) 100-62.5-25 MCG/INH AEPB Inhale 1 puff into the lungs daily. 2 each 0  . furosemide (LASIX) 20 MG tablet Take 2-4 tablets (40-80 mg total) by mouth as directed. Take 80 mg in the am and 40 mg in the PM 180 tablet 3  . loratadine (CLARITIN) 10 MG tablet Take 10 mg by mouth daily.    . Macitentan (OPSUMIT) 10 MG TABS Take 1 tablet (10 mg total) by mouth daily. 30 tablet 11  . metFORMIN (GLUCOPHAGE) 500 MG tablet Take 500 mg by mouth 2 (two) times daily with a meal.    . Omega 3  1000 MG CAPS Take 1 capsule by mouth daily.    Marland Kitchen omeprazole (PRILOSEC) 40 MG capsule Take 40 mg by mouth daily.    . OXYGEN Inhale 2 L into the lungs continuous.    . Potassium Chloride ER 20 MEQ TBCR Take 20 mEq by mouth daily. 90 tablet 3  . sildenafil (REVATIO) 20 MG tablet Take 1 tablet (20 mg total) 3 (three) times daily by mouth. 90 tablet 3   No current facility-administered medications for this encounter.  BP (!) 152/80   Pulse (!) 104   Wt 126 lb 1.9 oz (57.2 kg)   SpO2 98% Comment: 3L  BMI 23.07 kg/m    Wt Readings from Last 3 Encounters:  09/24/17 126 lb 1.9 oz (57.2 kg)  08/25/17 125 lb 3.2 oz (56.8 kg)  07/22/17 131 lb (59.4 kg)   General: NAD Neck: JVP 8 cm, no thyromegaly or thyroid nodule.  Lungs: Clear to auscultation bilaterally with normal respiratory effort. CV: Nondisplaced PMI.  Heart regular S1/S2 with widely split S2, no S3/S4, 1/6 SEM RUSB.  No peripheral edema.  No carotid bruit.  Normal pedal pulses.  Abdomen: Soft, nontender, no hepatosplenomegaly, no distention.  Skin: Intact without lesions or rashes.  Neurologic: Alert and oriented x 3.  Psych: Normal affect. Extremities: No clubbing or cyanosis.  HEENT: Normal.   Assessment/Plan: 1. 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 She was very short of breath with exertion and had very elevated right-sided filling pressures on 5/17 RHC.  PA pressure was systemic. Cardiac output low, but not moving towards IV Flolan given the mixed etiology of her PH. V/Q scan not suggestive of chronic PEs and autoimmune serologies sent (RF negative, ANCA negative, ANA only weakly positive (1:80).She has been unable to tolerate selexipag, Tyvaso, or riociguat. Echo in 7/18 appeared to show some improvement in RV function. 6 minute walk worse today, she has been off sildenafil to start riociguat but could not tolerate  riociguat so not taking.  - Will get her back on sildenafil 20 mg tid today (insurance has not allowed tadalafil).  - Continue Opsumit 10 mg daily.  - Repeat 6 min walk next appt.  - BNP today.  2. COPD: On home oxygen.  Prior long-time smoker.  3. Chronic diastolic CHF with prominent RV failure: Volume status looks ok on exam today. - Continue Lasix 80 mg qam and 40 mg qpm.      - BMET today.  4. SVT: No palpitations.   5. OSA: Has been unable to tolerate CPAP.  She uses oxygen at night.  OSA may have small contribution to [facility] but does not explain the extent of PAH.   Followup in 2 months.   Loralie Champagne 09/24/2017

## 2017-09-24 NOTE — Patient Instructions (Signed)
Start Sildenafil 20 mg (1 tab), three times a day   Labs drawn today (if we do not call you, then your lab work was stable)   6 minute walk test during visit  Your physician recommends that you schedule a follow-up appointment in: 2 months with Dr. Aundra Dubin

## 2017-11-17 ENCOUNTER — Ambulatory Visit (HOSPITAL_COMMUNITY)
Admission: RE | Admit: 2017-11-17 | Discharge: 2017-11-17 | Disposition: A | Discharge status: Home / Self Care | Payer: Medicare HMO | Source: Ambulatory Visit | Attending: Cardiology | Admitting: Cardiology

## 2017-11-17 VITALS — BP 142/78 | HR 102 | Wt 124.4 lb

## 2017-11-17 DIAGNOSIS — M7989 Other specified soft tissue disorders: Secondary | ICD-10-CM | POA: Diagnosis not present

## 2017-11-17 DIAGNOSIS — E1122 Type 2 diabetes mellitus with diabetic chronic kidney disease: Secondary | ICD-10-CM | POA: Diagnosis not present

## 2017-11-17 DIAGNOSIS — Z79899 Other long term (current) drug therapy: Secondary | ICD-10-CM | POA: Diagnosis not present

## 2017-11-17 DIAGNOSIS — E785 Hyperlipidemia, unspecified: Secondary | ICD-10-CM | POA: Insufficient documentation

## 2017-11-17 DIAGNOSIS — Z7984 Long term (current) use of oral hypoglycemic drugs: Secondary | ICD-10-CM | POA: Diagnosis not present

## 2017-11-17 DIAGNOSIS — I130 Hypertensive heart and chronic kidney disease with heart failure and stage 1 through stage 4 chronic kidney disease, or unspecified chronic kidney disease: Secondary | ICD-10-CM | POA: Insufficient documentation

## 2017-11-17 DIAGNOSIS — Z8673 Personal history of transient ischemic attack (TIA), and cerebral infarction without residual deficits: Secondary | ICD-10-CM | POA: Diagnosis not present

## 2017-11-17 DIAGNOSIS — Z9981 Dependence on supplemental oxygen: Secondary | ICD-10-CM | POA: Diagnosis not present

## 2017-11-17 DIAGNOSIS — N189 Chronic kidney disease, unspecified: Secondary | ICD-10-CM | POA: Diagnosis not present

## 2017-11-17 DIAGNOSIS — Z0189 Encounter for other specified special examinations: Secondary | ICD-10-CM | POA: Insufficient documentation

## 2017-11-17 DIAGNOSIS — G4733 Obstructive sleep apnea (adult) (pediatric): Secondary | ICD-10-CM | POA: Insufficient documentation

## 2017-11-17 DIAGNOSIS — Z87891 Personal history of nicotine dependence: Secondary | ICD-10-CM | POA: Diagnosis not present

## 2017-11-17 DIAGNOSIS — I471 Supraventricular tachycardia: Secondary | ICD-10-CM | POA: Insufficient documentation

## 2017-11-17 DIAGNOSIS — I5032 Chronic diastolic (congestive) heart failure: Secondary | ICD-10-CM

## 2017-11-17 DIAGNOSIS — J449 Chronic obstructive pulmonary disease, unspecified: Secondary | ICD-10-CM | POA: Diagnosis not present

## 2017-11-17 DIAGNOSIS — I272 Pulmonary hypertension, unspecified: Secondary | ICD-10-CM | POA: Diagnosis not present

## 2017-11-17 LAB — BASIC METABOLIC PANEL
ANION GAP: 9 (ref 5–15)
BUN: 29 mg/dL — ABNORMAL HIGH (ref 6–20)
CHLORIDE: 97 mmol/L — AB (ref 101–111)
CO2: 30 mmol/L (ref 22–32)
Calcium: 9.1 mg/dL (ref 8.9–10.3)
Creatinine, Ser: 0.94 mg/dL (ref 0.44–1.00)
GFR calc non Af Amer: 57 mL/min — ABNORMAL LOW (ref >60)
Glucose, Bld: 152 mg/dL — ABNORMAL HIGH (ref 65–99)
POTASSIUM: 3.4 mmol/L — AB (ref 3.5–5.1)
SODIUM: 136 mmol/L (ref 135–145)

## 2017-11-17 MED ORDER — SILDENAFIL CITRATE 20 MG PO TABS: 40.0000 mg | ORAL_TABLET | Freq: Three times a day (TID) | ORAL | 3 refills | Status: DC

## 2017-11-17 NOTE — Patient Instructions (Signed)
Increase Sildenafil 40 mg (2 tabs), three times a day  Labs drawn today (if we do not call you, then your lab work was stable)   Your physician recommends that you schedule a follow-up appointment in: 3 months Dr. Aundra Dubin  (will call you)

## 2017-11-18 ENCOUNTER — Telehealth (HOSPITAL_COMMUNITY): Payer: Self-pay

## 2017-11-18 MED ORDER — POTASSIUM CHLORIDE ER 20 MEQ PO TBCR: 40.0000 meq | EXTENDED_RELEASE_TABLET | Freq: Every day | ORAL | 3 refills | Status: DC

## 2017-11-18 NOTE — Progress Notes (Signed)
Patient ID: Beth [name], female   DOB: 1941-11-08, 77 y.o.   MRN: [id number] PCP: Dr Karlton Lemon Cardiology: Dr. Aundra Dubin  77 yo with history of COPD on home oxygen, untreated OSA, chronic diastolic CHF/RV failure, and pulmonary hypertension returns for followup of pulmonary hypertension and diastolic CHF.  She was admitted in 5/17 with acute on chronic diastolic CHF with prominent RV failure and marked shortness of breath.  She was diuresed with IV Lasix.  Echo showed dilated and dysfunctional RV with pulmonary hypertension.  RHC showed marked pulmonary hypertension with systemic PA pressure.  She was started on Revatio in the hospital and this was titrated up.  Subsequently, she was started on macitentan.  She next started Selexipag but she had intractable side effects with selexipag so stopped it.  I then tried her on Tyvaso, but she has developed abdominal discomfort and diarrhea with Tyvaso and has had to stop it (symptomatic even at lowest dose). She did not feel like it helped her breathing when she was on it.  She stopped sildenafil and started riociguat. However, she developed painful hand swelling on riociguat and has had to stop it.  Symptoms resolved off riociguat.    She was admitted in 8/18 with hypotension and AKI.  Valsartan and HCTZ stopped.     Patient returns for followup of CHF and pulmonary hypertension.  Weight is down 2 lbs.  She feels like her breathing is doing better.  She is short of breath when she walks fast.  Able to do all ADLs (dressing, chores) without significant dyspnea.  No chest pain.  No lightheadedness.  She lives alone. BP mildly elevated today, usually runs around 875 systolic.   6 minute walk (7/17): 238 m 6 minute walk (10/17): 171 m 6 minute walk (3/18): 231 m 6 minute walk (11/18): 123 m 6 minute walk (1/19): 213 m  Labs (5/17): K 4.7 => 4, creatinine 0.96 => 1.04 Labs (8/17): K 4.1, creatinine 1.5 Labs (9/17): K 3.5, creatinine 1.13 Labs (10/17): K 3.6,  creatinine 1.0 Labs (1/18): K 3.9, creatinine 1.14, BNP 34 Labs (2/18): K 4.1, creatinine 1.07, BNP 25 Labs (3/18): K 3.8, creatinine 1.06, BNP 25 Labs (8/18): K 4.6, creatinine 1.38, hgb 9.1 Labs (9/18): BNP 8.7 Labs (11/18): BNP 36, K 3.6, creatinine 0.65  PMH: 1. H/o CVA 2. DM 3. HTN 4. Hyperlipidemia 5. OSA: Cannot tolerate CPAP, has tried multiple masks.  6. COPD: She is on home oxygen. 7. SVT 8. Chronic diastolic CHF with RV failure: Echo (5/17) with EF 55%, severely dilated RV with moderately decreased systolic function, D-shaped interventricular septum.  - Echo (7/18): EF 60-65%, D-shaped interventricular septum, mild RV dilation with normal systolic function, PASP 41, IVC normal, mild MR, mild AS.  9. 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  V/Q scan 5/17 with no evidence for chronic PE.  ANA weakly positive (1:80), ANCA negative, RF negative.  RHC (5/17) with mean RA 20, PA 110/45 mean 66, mean PCWP 20, CI 1.7, PVR 15 WU.  - Intractable side effects with selexipag.  - Did not tolerate Tyvaso - Did not tolerate riociguat.  10. CKD  Social History   Socioeconomic History  . Marital status: Widowed    Spouse name: Not on file  . Number of children: 2  . Years of education: Not on file  . Highest education level: Not on file  Social Needs  .  Financial resource strain: Not on file  . Food insecurity - worry: Not on file  . Food insecurity - inability: Not on file  . Transportation needs - medical: Not on file  . Transportation needs - non-medical: Not on file  Occupational History  . Occupation: retired    Fish farm manager: OTHER    Comment: [employer]  Tobacco Use  . Smoking status: Former Smoker    Packs/day: 1.50    Years: 40.00    Pack years: 60.00    Types: Cigarettes    Last attempt to quit: 11/16/2004    Years since quitting: 13.0  . 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 Alcohol use: No    Comment: occassional  . Drug use: No  . Sexual activity: Not on file  Other Topics Concern  . Not on file  Social History Narrative  . Not on file   Family History  Problem Relation Age of Onset  . Heart disease Mother   . Heart failure Mother   . Heart disease Brother   . Heart disease Brother   . Heart disease Sister   . Heart disease Sister   . Heart failure Father    ROS: All systems reviewed and negative except as per HPI.   Current Outpatient Medications  Medication Sig Dispense Refill  . albuterol (PROAIR HFA) 108 (90 BASE) MCG/ACT inhaler 2 puffs every 4 hours as needed only  if your can't catch your breath 1 Inhaler 11  . albuterol (PROVENTIL) (2.5 MG/3ML) 0.083% nebulizer solution Take 2.5 mg by nebulization every 6 (six) hours as needed for wheezing or shortness of breath.    . allopurinol (ZYLOPRIM) 300 MG tablet Take 300 mg by mouth daily.    . ASPIRIN ADULT LOW STRENGTH 81 MG EC tablet TAKE 1 TABLET BY MOUTH EVERY DAY 90 tablet 3  . atorvastatin (LIPITOR) 40 MG tablet Take 40 mg by mouth daily at 6 PM.    . b complex vitamins tablet Take 1 tablet by mouth daily.    . Cholecalciferol (VITAMIN D3) 1000 units CAPS Take 1,000 Units by mouth daily.    Marland Kitchen co-enzyme Q-10 30 MG capsule Take 30 mg by mouth daily.    . Fluticasone-Umeclidin-Vilant (TRELEGY ELLIPTA) 100-62.5-25 MCG/INH AEPB Inhale 1 puff into the lungs daily. 1 each 5  . Fluticasone-Umeclidin-Vilant (TRELEGY ELLIPTA) 100-62.5-25 MCG/INH AEPB Inhale 1 puff into the lungs daily. 2 each 0  . furosemide (LASIX) 20 MG tablet Take 2-4 tablets (40-80 mg total) by mouth as directed. Take 80 mg in the am and 40 mg in the PM 180 tablet 3  . loratadine (CLARITIN) 10 MG tablet Take 10 mg by mouth daily.    . Macitentan (OPSUMIT) 10 MG TABS Take 1 tablet (10 mg total) by mouth daily. 30 tablet 11  . metFORMIN (GLUCOPHAGE) 500 MG tablet Take 500 mg by mouth 2 (two)  times daily with a meal.    . Omega 3 1000 MG CAPS Take 1 capsule by mouth daily.    Marland Kitchen omeprazole (PRILOSEC) 40 MG capsule Take 40 mg by mouth daily.    . OXYGEN Inhale 2 L into the lungs continuous.    . sildenafil (REVATIO) 20 MG tablet Take 2 tablets (40 mg total) by mouth 3 (three) times daily. 180 tablet 3  . Potassium Chloride ER 20 MEQ TBCR Take 40 mEq by mouth daily. 60 tablet 3   No current facility-administered medications for this encounter.  BP (!) 142/78   Pulse (!) 102   Wt 124 lb 6.4 oz (56.4 kg)   SpO2 95% Comment: 2L  BMI 22.75 kg/m    Wt Readings from Last 3 Encounters:  11/17/17 124 lb 6.4 oz (56.4 kg)  09/24/17 126 lb 1.9 oz (57.2 kg)  08/25/17 125 lb 3.2 oz (56.8 kg)   General: NAD Neck: No JVD, no thyromegaly or thyroid nodule.  Lungs: Clear to auscultation bilaterally with normal respiratory effort. CV: Nondisplaced PMI.  Heart regular S1/S2 with widely split S2, no S3/S4, no murmur.  No peripheral edema.  No carotid bruit.  Normal pedal pulses.  Abdomen: Soft, nontender, no hepatosplenomegaly, no distention.  Skin: Intact without lesions or rashes.  Neurologic: Alert and oriented x 3.  Psych: Normal affect. Extremities: No clubbing or cyanosis.  HEENT: Normal.   Assessment/Plan: 1. 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 She was very short of breath with exertion and had very elevated right-sided filling pressures on 5/17 RHC.  PA pressure was systemic. Cardiac output low, but not moving towards IV Flolan given the mixed etiology of her PH. V/Q scan not suggestive of chronic PEs and autoimmune serologies sent (RF negative, ANCA negative, ANA only weakly positive (1:80).She has been unable to tolerate selexipag, Tyvaso, or riociguat. Echo in 7/18 appeared to show some improvement in RV function. 6 minute walk improved today.  - Increase sildenafil to 40  mg tid (unable to get Adcirca through her insurance).   - Continue Opsumit 10 mg daily.  2. COPD: On home oxygen.  Prior long-time smoker.  3. Chronic diastolic CHF with prominent RV failure: Volume status looks ok on exam today. - Continue Lasix 80 mg qam and 40 mg qpm.      - BMET today.  4. SVT: No palpitations.   5. OSA: Has been unable to tolerate CPAP.  She uses oxygen at night.  OSA may have small contribution to [facility] but does not explain the extent of PAH.   Followup in 3 months.   Loralie Champagne 11/18/2017

## 2017-11-18 NOTE — Telephone Encounter (Signed)
Notes recorded by Shirley Muscat, RN on 11/18/2017 at 4:13 PM EST Pt aware of results, agreeable to med changes, Appt scheduled   ------  Notes recorded by Larey Dresser, MD on 11/17/2017 at 9:21 PM EST Increase total daily K by 20 mEq with BMET 2 wks

## 2017-11-19 NOTE — Progress Notes (Signed)
Pt completed 6 minute walk test. Pt walked 700 ft (213 meters). O2 sats ranged from 89%-98%, and HR ranged from 89-145. Information given to Dr. Aundra Dubin  to review, no further orders

## 2017-11-19 NOTE — Addendum Note (Signed)
Encounter addended by: Shirley Muscat, RN on: 11/19/2017 3:22 PM  Actions taken: Sign clinical note

## 2017-11-23 ENCOUNTER — Telehealth (HOSPITAL_COMMUNITY): Payer: Self-pay | Admitting: Pharmacist

## 2017-11-23 NOTE — Telephone Encounter (Signed)
Opsumit PA already on file with [place] K. Velva Harman, PharmD, BCPS, CPP Clinical Pharmacist Phone: [phone] 11/23/2017 2:35 PM

## 2017-11-29 ENCOUNTER — Telehealth (HOSPITAL_COMMUNITY): Payer: Self-pay | Admitting: Pharmacist

## 2017-11-29 NOTE — Telephone Encounter (Signed)
[employer] approved assistance for Opsumit through 11/15/18.   [name]. Velva Harman, PharmD, BCPS, CPP Clinical Pharmacist Phone: [phone] 11/29/2017 10:08 AM

## 2017-12-08 ENCOUNTER — Other Ambulatory Visit (HOSPITAL_COMMUNITY): Payer: Medicare HMO

## 2017-12-14 ENCOUNTER — Ambulatory Visit (HOSPITAL_COMMUNITY)
Admission: RE | Admit: 2017-12-14 | Discharge: 2017-12-14 | Disposition: A | Discharge status: Home / Self Care | Payer: Medicare HMO | Source: Ambulatory Visit | Attending: Cardiology | Admitting: Cardiology

## 2017-12-14 DIAGNOSIS — I5032 Chronic diastolic (congestive) heart failure: Secondary | ICD-10-CM | POA: Diagnosis present

## 2017-12-14 LAB — BASIC METABOLIC PANEL
Anion gap: 13 (ref 5–15)
BUN: 28 mg/dL — AB (ref 6–20)
CALCIUM: 9.4 mg/dL (ref 8.9–10.3)
CO2: 29 mmol/L (ref 22–32)
Chloride: 97 mmol/L — ABNORMAL LOW (ref 101–111)
Creatinine, Ser: 0.92 mg/dL (ref 0.44–1.00)
GFR calc Af Amer: >60 mL/min (ref >60)
GFR, EST NON AFRICAN AMERICAN: 59 mL/min — AB (ref >60)
GLUCOSE: 134 mg/dL — AB (ref 65–99)
Potassium: 4 mmol/L (ref 3.5–5.1)
Sodium: 139 mmol/L (ref 135–145)

## 2017-12-23 ENCOUNTER — Other Ambulatory Visit (HOSPITAL_COMMUNITY): Payer: Self-pay | Admitting: Cardiology

## 2017-12-23 DIAGNOSIS — I509 Heart failure, unspecified: Secondary | ICD-10-CM

## 2017-12-27 ENCOUNTER — Encounter: Payer: Self-pay | Admitting: Internal Medicine

## 2017-12-27 ENCOUNTER — Ambulatory Visit: Payer: Medicare HMO | Admitting: Internal Medicine

## 2017-12-27 VITALS — BP 124/70 | HR 98 | Ht 62.0 in | Wt 125.4 lb

## 2017-12-27 DIAGNOSIS — J9611 Chronic respiratory failure with hypoxia: Secondary | ICD-10-CM | POA: Diagnosis not present

## 2017-12-27 DIAGNOSIS — J9612 Chronic respiratory failure with hypercapnia: Secondary | ICD-10-CM | POA: Diagnosis not present

## 2017-12-27 DIAGNOSIS — J449 Chronic obstructive pulmonary disease, unspecified: Secondary | ICD-10-CM

## 2017-12-27 DIAGNOSIS — I272 Pulmonary hypertension, unspecified: Secondary | ICD-10-CM | POA: Diagnosis not present

## 2017-12-27 MED ORDER — ALBUTEROL SULFATE HFA 108 (90 BASE) MCG/ACT IN AERS: INHALATION_SPRAY | RESPIRATORY_TRACT | 2 refills | Status: DC

## 2017-12-27 NOTE — Progress Notes (Signed)
Subjective:    Patient ID: Beth [name], female    DOB: 06-17-1941    MRN: [id number]     Brief patient profile:  62 yobf quit smoking around 2006-7 when noted onset of doe then gradually worse since quit with no gain in wt and referred 05/31/2012 for copd evaluation by Dr Karlton Lemon with GOLD III COPD by PFT's 06/2012    History of Present Illness  05/31/2012 1st pulmonary eval cc progressive doe x 5years ? Some worse in spring and ? Summer with hot humid weather with subjective wheeze while on spiriva x 2 years then advair 7/13 and prednisone has helped to point where need neb less "only  3 x day" and puffer 3-4 times as well. Assoc with voice change/ hoarsenss, ear pain.   No unusual cough, purulent sputum or sinus/hb symptoms on present rx. rec Work on inhaler technique:   Only use your albuterol as a rescue medication (proaire is Plan B,  Nebulizer is C) Add pepcid 20 mg one every night at bedtime GERD diet office visit in 4 weeks, sooner if needed with PFTs    04/03/2014 f/u ov/Yanna Leaks re: copd III / symbicort 160 2bid / proair if over does it Chief Complaint  Patient presents with  . Follow-up    No change in breathing, occas wheezing w/ exertion. uses O2 at bedtime  rec Work on inhaler technique:  F/u prn     Admit date: 06/22/2017 Discharge date: 06/24/2017  Brief/Interim Summary: For complete details see H&P but in brief. Beth [name] is a 77 year old female with medical history of hypertension, hyperlipidemia, diabetes mellitus, COPD on 2 L oxygen at home, asthma, GERD, diastolic CHF, pulmonary hypertension, chronic kidney disease stage III who presented with confusion and slurred speech. Patient daughter reported that patient became confused and noted mild slurred speech but no unilateral weakness. Upon ED evaluation patient was already alert and oriented 3 with no weakness or slurred speech. She was found to be hypotensive with blood pressure of 79/42 which improved after IV  fluid bolus. CT scan of the head did not show any acute abnormalities and she was placed on observation for further evaluation. MRI of the head was done with negative results. On blood work up her creatinine was found to be slight worsening and this was treated with IV fluids. Patient clinically improved, was elevated by physical therapy who recommended PT/OT at home. Blood pressure has remained stable and no further neurologic deficit has been noted.  Subjective: Patient seen and examined on the day of discharge she reports doing much better than when she was admitted. Has no complaint. Her breathing is at baseline. Denies any weakness, dizziness, chest pain, palpitations and blurry vision. Patient remains afebrile, tolerating diet well. Kidney function has improved  Discharge Diagnoses/Hospital Course:  Hypotension Multifactorial, patient on multiple blood pressure lowering agents, diuretics and medications for pulmonary hypertension. She was treated with IV fluids which improved blood pressure. During hospital stay, home medications were held, and blood pressure went above goal with IV fluids. Macitentan and Riociguat were resume and blood pressure up on discharge was more stable. Valsartan and HCTZ were held and will continue to be on hold until patient is seen by cardiology or primary care physician.  Acute metabolic encephalopathy Hypotension and dehydration may have contributed to her AMS, although would not explain slurred speech. Workup so far reassuring as MRI and CT of the head not show any acute abnormality. Patient is back to baseline,  she is alert and oriented 3 and her speech is clear. Will defer to PCP if further workup is necessary.  Acute renal failure on chronic kidney disease Baseline creatinine is 1.0, pulmonary admission serum creatinine was 2.0. Highly secondary to dehydration and continuation of ARB is, diuretics and NSAIDs. Patient also hypotensive ATN maybe a component.  Serum creatinine back to baseline, improving with IV fluids. Patient was encouraged to follow-up with primary care physician. And encouraged to keep good oral hydration.   COPD - stable No changes in medication were made during hospital stay  Chronic diastolic heart failure Patient was dry during hospital stay. She was given IV fluid therefore will resume Lasix upon discharge. Follow-up with cardiology  Hypertension Patient was admitted due to hypotension Valsartan and HCTZ were placed on hold, and will continue to be on hold until patient is evaluated by cardiology or primary care physician. Patient on medications for pulmonary hypertension which have fairly significant effect on lowering BP.   Pulmonary hypertension Continue Macitentan and Riociguat  All other chronic medical condition were stable during the hospitalization.  Patient seen by PT recommending [facility] PT/OT  On the day of the discharge the patient's vitals were stable, and no other acute medical condition were reported by patient. Patient was felt safe to be discharge to home      07/16/2017  f/u ov/Tylynn Braniff re: COPD/ chronic resp failure/ cor pulmonale on symbicort but unable to use effectively  Chief Complaint  Patient presents with  . Pulmonary Consult    SOB with exertion referred by Dr. Willey Blade  gradually downhill since  may of 2017 with worse breathing no cough increase 02= 2lpm   Doe = MMRC4  = sob if tries to leave home or while getting dressed  rec Plan A = Automatic = Trelegy one click each am - call if any problem acquiring  Plan B = Backup Only use your albuterol as a rescue medication  Plan C = Crisis - only use your albuterol nebulizer if you first try Plan B and it fails to help      12/27/2017  f/u ov/Shirrell Solinger re:  GOLD III/ 02  19/24 2lpm  With cor pulmonale  Chief Complaint  Patient presents with  . Follow-up    Breathing has improved. She states she has not had to use her albuterol inhaler  or neb.   Dyspnea:   MMRC3 = can't walk 100 yards even at a slow pace at a flat grade s stopping due to sob on 2lpm  Cough: no Sleep: ok on 2lpm one pillows   Confused with details of care, thinks she's using trelegy bid "maybe that's why it's so expensive" and not sure about what rescue rx she has on hand.  No obvious day to day or daytime variability or assoc excess/ purulent sputum or mucus plugs or hemoptysis or cp or chest tightness, subjective wheeze or overt sinus or hb symptoms. No unusual exposure hx or h/o childhood pna/ asthma or knowledge of premature birth.  Sleeping ok 2lpm / one pillow without nocturnal  or early am exacerbation  of respiratory  c/o's or need for noct saba. Also denies any obvious fluctuation of symptoms with weather or environmental changes or other aggravating or alleviating factors except as outlined above   Current Allergies, Complete Past Medical History, Past Surgical History, Family History, and Social History were reviewed in [employer] record.  ROS  The following are not active complaints unless bolded  Hoarseness, sore throat, dysphagia, dental problems, itching, sneezing,  nasal congestion or discharge of excess mucus or purulent secretions, ear ache,   fever, chills, sweats, unintended wt loss or wt gain, classically pleuritic or exertional cp,  orthopnea pnd or leg swelling, presyncope, palpitations, abdominal pain, anorexia, nausea, vomiting, diarrhea  or change in bowel habits or change in bladder habits, change in stools or change in urine, dysuria, hematuria,  rash, arthralgias, visual complaints, headache, numbness, weakness or ataxia or problems with walking or coordination,  change in mood/affect or memory.          Meds: not able to verify / did not bring as req                Objective:   Physical Exam  amb bf somewhat childlike affect  12/27/2017       125   07/16/17 131 lb 12.8 oz (59.8 kg)  06/07/17 137 lb  4 oz (62.3 kg)  04/13/17 143 lb 8 oz (65.1 kg)    Vital signs reviewed - Note on arrival 02 sats  97% on  2lpm cont     HEENT: nl dentition, turbinates bilaterally, and oropharynx. Nl external ear canals without cough reflex   NECK :  without JVD/Nodes/TM/ nl carotid upstrokes bilaterally   LUNGS: no acc muscle use,  barrel contour chest wall with bilateral  Diminished bs  without cough on insp or exp maneuver and hyper resonant  to  percussion bilaterally    CV:  RRR  no s3 or murmur  - Pos increase in P2, and no edema   ABD:  soft and nontender with nl inspiratory excursion in the supine position. No bruits or organomegaly appreciated, bowel sounds nl  MS:  Nl gait/ ext warm without deformities, calf tenderness, cyanosis or clubbing No obvious joint restrictions   SKIN: warm and dry without lesions    NEURO:  alert, approp, nl sensorium with  no motor or cerebellar deficits apparent.            Assessment & Plan:

## 2017-12-27 NOTE — Patient Instructions (Addendum)
.  Plan A = Automatic = Trelegy one click each am     Plan B = Backup Only use your albuterol as a rescue medication to be used if you can't catch your breath by resting or doing a relaxed purse lip breathing pattern.  - The less you use it, the better it will work when you need it. - Ok to use the inhaler up to 2 puffs  every 4 hours if you must but call for appointment if use goes up over your usual need - Don't leave home without it !!  (think of it like the spare tire for your car)   Plan C = Crisis - only use your albuterol nebulizer if you first try Plan B and it fails to help > ok to use the nebulizer up to every 4 hours but if start needing it regularly call for immediate appointment    Please schedule a follow up visit in 6  months but call sooner if needed - PLEASE BRING ALL INHALERS/SOLUTIONS WITH YOU ON RETURN

## 2017-12-29 ENCOUNTER — Encounter: Payer: Self-pay | Admitting: Internal Medicine

## 2017-12-29 NOTE — Assessment & Plan Note (Signed)
See [place]  03/23/16 with PVR = 15 but pcwp 20  See Echo 06/07/17 - Normal LV size with EF 60-65%. Mildly dilated RV with normal   systolic function. D-shaped intraventricular septum suggesting a   degree of RV pressure/volume overload. Mild mitral regurgitation.   Mild aortic stenosis (though not fully interrogated). Mild   pulmonary hypertension. IVC not dilated.  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

## 2017-12-29 NOTE — Assessment & Plan Note (Addendum)
-  PFT's 06/30/2012 FEV1  0.93 (49%) ratio 49 and DLCO 47 corrects to 87   - 07/16/2017  After extensive coaching HFA effectiveness =    0% but 90% with dpi > trial of trelegy  - Spirometry 07/16/2017  FEV1 0.57 (38%)  Ratio 54 with atypical f/v with no rx prior   Improved though still not clear what she's taking   rec return with all meds in hand using a trust but verify approach to confirm accurate Medication  Reconciliation The principal here is that until we are certain that the  patients are doing what we've asked, it makes no sense to ask them to do more.   I had an extended discussion with the patient reviewing all relevant studies completed to date and  lasting 15 to 20 minutes of a 25 minute visit    Each maintenance medication was reviewed in detail including most importantly the difference between maintenance and prns and under what circumstances the prns are to be triggered using an action plan format that is not reflected in the computer generated alphabetically organized AVS.    Please see AVS for specific instructions unique to this visit that I personally wrote and verbalized to the the pt in detail and then reviewed with pt  by my nurse highlighting any  changes in therapy recommended at today's visit to their plan of care.

## 2017-12-29 NOTE — Assessment & Plan Note (Signed)
HC03  06/23/17 = 31  But as high as 35 one month prior HCO3  12/14/17 = 29   Hypercarbia likely improved > rec 2lpm 24/7  And not 19/24h per day which is current use

## 2018-03-22 ENCOUNTER — Other Ambulatory Visit (HOSPITAL_COMMUNITY): Payer: Self-pay | Admitting: Cardiology

## 2018-03-22 DIAGNOSIS — I509 Heart failure, unspecified: Secondary | ICD-10-CM

## 2018-03-29 ENCOUNTER — Other Ambulatory Visit: Payer: Self-pay | Admitting: Internal Medicine

## 2018-03-29 DIAGNOSIS — E2839 Other primary ovarian failure: Secondary | ICD-10-CM

## 2018-04-18 ENCOUNTER — Other Ambulatory Visit (HOSPITAL_COMMUNITY): Payer: Self-pay | Admitting: Cardiology

## 2018-04-18 DIAGNOSIS — I509 Heart failure, unspecified: Secondary | ICD-10-CM

## 2018-04-29 ENCOUNTER — Telehealth (HOSPITAL_COMMUNITY): Payer: Self-pay | Admitting: *Deleted

## 2018-04-29 NOTE — Telephone Encounter (Signed)
Completed PA for pt's Sildenafil, med approved [facility] 04/29/20, pharmacy aware

## 2018-05-09 ENCOUNTER — Inpatient Hospital Stay
Admission: RE | Admit: 2018-05-09 | Discharge: 2018-05-09 | Disposition: A | Discharge status: Home / Self Care | Payer: Medicare HMO | Source: Ambulatory Visit | Attending: Internal Medicine | Admitting: Internal Medicine

## 2018-06-12 ENCOUNTER — Other Ambulatory Visit (HOSPITAL_COMMUNITY): Payer: Self-pay | Admitting: Internal Medicine

## 2018-06-12 DIAGNOSIS — I509 Heart failure, unspecified: Secondary | ICD-10-CM

## 2018-06-20 ENCOUNTER — Ambulatory Visit
Admission: RE | Admit: 2018-06-20 | Discharge: 2018-06-20 | Disposition: A | Discharge status: Home / Self Care | Payer: Medicare HMO | Source: Ambulatory Visit | Attending: Internal Medicine | Admitting: Internal Medicine

## 2018-06-20 DIAGNOSIS — E2839 Other primary ovarian failure: Secondary | ICD-10-CM

## 2018-06-28 ENCOUNTER — Ambulatory Visit: Payer: Medicare HMO | Admitting: Internal Medicine

## 2018-06-28 ENCOUNTER — Encounter: Payer: Self-pay | Admitting: Internal Medicine

## 2018-06-28 VITALS — BP 140/80 | HR 89 | Ht 62.0 in | Wt 128.8 lb

## 2018-06-28 DIAGNOSIS — J9611 Chronic respiratory failure with hypoxia: Secondary | ICD-10-CM | POA: Diagnosis not present

## 2018-06-28 DIAGNOSIS — J9612 Chronic respiratory failure with hypercapnia: Secondary | ICD-10-CM | POA: Diagnosis not present

## 2018-06-28 DIAGNOSIS — I272 Pulmonary hypertension, unspecified: Secondary | ICD-10-CM | POA: Diagnosis not present

## 2018-06-28 DIAGNOSIS — J449 Chronic obstructive pulmonary disease, unspecified: Secondary | ICD-10-CM

## 2018-06-28 MED ORDER — FLUTICASONE-UMECLIDIN-VILANT 100-62.5-25 MCG/INH IN AEPB: 1.0000 | INHALATION_SPRAY | Freq: Every day | RESPIRATORY_TRACT | 0 refills | Status: AC

## 2018-06-28 NOTE — Patient Instructions (Signed)
No  Change in medications    02 is 2lpm at bedtime and ok to adjust during the day to keep your 02 saturations above 90% (no need for 02 at all if over 90% without it)   Please schedule a follow up visit in 6 months but call sooner if needed  with all medications /inhalers/ solutions in hand so we can verify exactly what you are taking. This includes all medications from all doctors and over the counters

## 2018-06-28 NOTE — Progress Notes (Signed)
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 Some worse in spring and ? Summer with hot humid weather with subjective wheeze while on spiriva x 2 years then advair 7/13 and prednisone has helped to point where need neb less "only  3 x day" and puffer 3-4 times as well. Assoc with voice change/ hoarsenss, ear pain.   No unusual cough, purulent sputum or sinus/hb symptoms on present rx. rec Work on inhaler technique:   Only use your albuterol as a rescue medication (proaire is Plan B,  Nebulizer is C) Add pepcid 20 mg one every night at bedtime GERD diet office visit in 4 weeks, sooner if needed with PFTs    04/03/2014 f/u ov/Kell Ferris re: copd III / symbicort 160 2bid / proair if over does it Chief Complaint  Patient presents with  . Follow-up    No change in breathing, occas wheezing w/ exertion. uses O2 at bedtime  rec Work on inhaler technique:  F/u prn     Admit date: 06/22/2017 Discharge date: 06/24/2017  Brief/Interim Summary: For complete details see H&P but in brief. Beth [name] is a 77 year old female with medical history of hypertension, hyperlipidemia, diabetes mellitus, COPD on 2 L oxygen at home, asthma, GERD, diastolic CHF, pulmonary hypertension, chronic kidney disease stage III who presented with confusion and slurred speech. Patient daughter reported that patient became confused and noted mild slurred speech but no unilateral weakness. Upon ED evaluation patient was already alert and oriented 3 with no weakness or slurred speech. She was found to be hypotensive with blood pressure of 79/42 which improved after IV  fluid bolus. CT scan of the head did not show any acute abnormalities and she was placed on observation for further evaluation. MRI of the head was done with negative results. On blood work up her creatinine was found to be slight worsening and this was treated with IV fluids. Patient clinically improved, was elevated by physical therapy who recommended PT/OT at home. Blood pressure has remained stable and no further neurologic deficit has been noted.  Subjective: Patient seen and examined on the day of discharge she reports doing much better than when she was admitted. Has no complaint. Her breathing is at baseline. Denies any weakness, dizziness, chest pain, palpitations and blurry vision. Patient remains afebrile, tolerating diet well. Kidney function has improved  Discharge Diagnoses/Hospital Course:  Hypotension Multifactorial, patient on multiple blood pressure lowering agents, diuretics and medications for pulmonary hypertension. She was treated with IV fluids which improved blood pressure. During hospital stay, home medications were held, and blood pressure went above goal with IV fluids. Macitentan and Riociguat were resume and blood pressure up on discharge was more stable. Valsartan and HCTZ were held and will continue to be on hold until patient is seen by cardiology or primary care physician.  Acute metabolic encephalopathy Hypotension and dehydration may have contributed to her AMS, although would not explain slurred speech. Workup so far reassuring as MRI and CT of the head not show any acute abnormality. Patient is back to baseline,  she is alert and oriented 3 and her speech is clear. Will defer to PCP if further workup is necessary.  Acute renal failure on chronic kidney disease Baseline creatinine is 1.0, pulmonary admission serum creatinine was 2.0. Highly secondary to dehydration and continuation of ARB is, diuretics and NSAIDs. Patient also hypotensive ATN maybe a component.  Serum creatinine back to baseline, improving with IV fluids. Patient was encouraged to follow-up with primary care physician. And encouraged to keep good oral hydration.   COPD - stable No changes in medication were made during hospital stay  Chronic diastolic heart failure Patient was dry during hospital stay. She was given IV fluid therefore will resume Lasix upon discharge. Follow-up with cardiology  Hypertension Patient was admitted due to hypotension Valsartan and HCTZ were placed on hold, and will continue to be on hold until patient is evaluated by cardiology or primary care physician. Patient on medications for pulmonary hypertension which have fairly significant effect on lowering BP.   Pulmonary hypertension Continue Macitentan and Riociguat  All other chronic medical condition were stable during the hospitalization.  Patient seen by PT recommending [place] PT/OT  On the day of the discharge the patient's vitals were stable, and no other acute medical condition were reported by patient. Patient was felt safe to be discharge to home      07/16/2017  f/u ov/Rahul Malinak re: COPD/ chronic resp failure/ cor pulmonale on symbicort but unable to use effectively  Chief Complaint  Patient presents with  . Pulmonary Consult    SOB with exertion referred by Dr. Willey Blade  gradually downhill since  may of 2017 with worse breathing no cough increase 02= 2lpm   Doe = MMRC4  = sob if tries to leave home or while getting dressed  rec Plan A = Automatic = Trelegy one click each am - call if any problem acquiring  Plan B = Backup Only use your albuterol as a rescue medication  Plan C = Crisis - only use your albuterol nebulizer if you first try Plan B and it fails to help      12/27/2017  f/u ov/Nathaneil Feagans re:  GOLD III/ 02  19/24 2lpm  With cor pulmonale  Chief Complaint  Patient presents with  . Follow-up    Breathing has improved. She states she has not had to use her albuterol inhaler  or neb.   Dyspnea:   MMRC3 = can't walk 100 yards even at a slow pace at a flat grade s stopping due to sob on 2lpm  Cough: no Sleep: ok on 2lpm one pillows  rec Plan A = Automatic = Trelegy one click each am    Plan B = Backup Only use your albuterol as a rescue medication Plan C = Crisis - only use your albuterol nebulizer if you first try Plan B and it fails to help > ok to use the nebulizer up to every 4 hours but if start needing it regularly call for immediate appointment Please schedule a follow up visit in 6  months but call sooner if needed - PLEASE BRING ALL INHALERS/SOLUTIONS WITH YOU ON RETURN     06/28/2018  f/u ov/Whittaker Lenis re:  GOLD III/ 02 dep  Chief Complaint  Patient presents with  . Follow-up    Breathing is overall doing well. She is using her albuterol inhaler about once per wk and rarely uses neb.    Dyspnea:  foodlion  MMRC3 = can't walk 100 yards even at a slow  pace at a flat grade s stopping due to sob  On 2lpm Cough: no Sleeping: one pillow and 2lpm  SABA use: rare 02: does not vary = 2lpm      No obvious day to day or daytime variability or assoc excess/ purulent sputum or mucus plugs or hemoptysis or cp or chest tightness, subjective wheeze or overt sinus or hb symptoms.   Sleeping as above  without nocturnal  or early am exacerbation  of respiratory  c/o's or need for noct saba. Also denies any obvious fluctuation of symptoms with weather or environmental changes or other aggravating or alleviating factors except as outlined above   No unusual exposure hx or h/o childhood pna/ asthma or knowledge of premature birth.  Current Allergies, Complete Past Medical History, Past Surgical History, Family History, and Social History were reviewed in [employer] record.  ROS  The following are not active complaints unless bolded Hoarseness, sore throat, dysphagia, dental problems, itching, sneezing,  nasal congestion or discharge of excess mucus  or purulent secretions, ear ache,   fever, chills, sweats, unintended wt loss or wt gain, classically pleuritic or exertional cp,  orthopnea pnd or arm/hand swelling  or leg swelling, presyncope, palpitations, abdominal pain, anorexia, nausea, vomiting, diarrhea  or change in bowel habits or change in bladder habits, change in stools or change in urine, dysuria, hematuria,  rash, arthralgias, visual complaints, headache, numbness, weakness or ataxia or problems with walking or coordination,  change in mood or  memory.        Current Meds  Medication Sig  . albuterol (PROAIR HFA) 108 (90 Base) MCG/ACT inhaler 2 puffs every 4 hours as needed only  if your can't catch your breath  . albuterol (PROVENTIL) (2.5 MG/3ML) 0.083% nebulizer solution Take 2.5 mg by nebulization every 6 (six) hours as needed for wheezing or shortness of breath.  . allopurinol (ZYLOPRIM) 300 MG tablet Take 300 mg by mouth daily.  . ASPIRIN ADULT LOW STRENGTH 81 MG EC tablet TAKE 1 TABLET BY MOUTH EVERY DAY  . atorvastatin (LIPITOR) 40 MG tablet Take 40 mg by mouth daily at 6 PM.  . b complex vitamins tablet Take 1 tablet by mouth daily.  . Cholecalciferol (VITAMIN D3) 1000 units CAPS Take 1,000 Units by mouth daily.  Marland Kitchen co-enzyme Q-10 30 MG capsule Take 30 mg by mouth daily.  . Fluticasone-Umeclidin-Vilant (TRELEGY ELLIPTA) 100-62.5-25 MCG/INH AEPB Inhale 1 puff into the lungs daily.  . furosemide (LASIX) 20 MG tablet TAKE 4 TABLETS BY MOUTH IN THE MORNING AND 2 TABLETS IN THE EVENING AS DIRECTED  . KLOR-CON M20 20 MEQ tablet TAKE 2 TABLETS BY MOUTH EVERY DAY  . loratadine (CLARITIN) 10 MG tablet Take 10 mg by mouth daily.  . Macitentan (OPSUMIT) 10 MG TABS Take 1 tablet (10 mg total) by mouth daily.  . metFORMIN (GLUCOPHAGE) 500 MG tablet Take 500 mg by mouth 2 (two) times daily with a meal.  . Omega 3 1000 MG CAPS Take 1 capsule by mouth daily.  Marland Kitchen omeprazole (PRILOSEC) 40 MG capsule Take 40 mg by mouth daily.  . OXYGEN  Inhale 2 L into the lungs continuous.  . sildenafil (REVATIO) 20 MG tablet TAKE 2 TABLETS BY MOUTH 3 TIMES DAILY.             Objective:   Physical Exam  amb bf nad   06/28/2018       128  12/27/2017       125  07/16/17 131 lb 12.8 oz (59.8 kg)  06/07/17 137 lb 4 oz (62.3 kg)  04/13/17 143 lb 8 oz (65.1 kg)   Vital signs reviewed - Note on arrival 02 sats  99% on 2lpm continuous    HEENT: nl dentition / oropharynx. Nl external ear canals without cough reflex -  Mild bilateral non-specific turbinate edema     NECK :  without JVD/Nodes/TM/ nl carotid upstrokes bilaterally   LUNGS: no acc muscle use,  Mild barrel  contour chest wall with bilateral  Distant bs s audible wheeze and  without cough on insp or exp maneuver and mod  Hyperresonant  to  percussion bilaterally     CV:  RRR  no s3 or murmur  - slt increase in P2, and no edema   ABD:  soft and nontender with pos mid insp Hoover's  in the supine position. No bruits or organomegaly appreciated, bowel sounds nl  MS:   Nl gait/  ext warm without deformities, calf tenderness, cyanosis or clubbing No obvious joint restrictions   SKIN: warm and dry without lesions    NEURO:  alert, approp, nl sensorium with  no motor or cerebellar deficits apparent.                          Assessment & Plan:

## 2018-07-01 ENCOUNTER — Encounter: Payer: Self-pay | Admitting: Internal Medicine

## 2018-07-01 NOTE — Assessment & Plan Note (Signed)
-  PFT's 06/30/2012 FEV1  0.93 (49%) ratio 49 and DLCO 47 corrects to 87   - 07/16/2017  After extensive coaching HFA effectiveness =    0% but 90% with dpi > trial of trelegy  - Spirometry 07/16/2017  FEV1 0.57 (38%)  Ratio 54 with atypical f/v with no rx prior   - The proper method of use, as well as anticipated side effects, of a metered-dose inhaler and elipta inhaler were discussed and demonstrated to the patient.    Group D in terms of symptom/risk and laba/lama/ICS  therefore appropriate rx at this point > trelegy approp

## 2018-07-01 NOTE — Assessment & Plan Note (Addendum)
HC03  06/23/17 = 31  But as high as 35 one month prior HCO3  12/14/17 = 29   - 06/28/2018  Walked 2lpm  x 3 laps @ 185 ft each stopped due to  End of study, slow pace, no  desat  And mild sob    Adequate control on present rx, reviewed in detail with pt > no change in rx needed  = 2lpm 24/7 ok to increase with amb to keep sats > 90% but did fine at today's slow pace

## 2018-07-01 NOTE — Assessment & Plan Note (Addendum)
See [place]  03/23/16 with PVR = 15 but pcwp 20  See Echo 06/07/17 - Normal LV size with EF 60-65%. Mildly dilated RV with normal   systolic function. D-shaped intraventricular septum suggesting a   degree of RV pressure/volume overload. Mild mitral regurgitation.   Mild aortic stenosis (though not fully interrogated). Mild   pulmonary hypertension. IVC not dilated.  This is cor pulmonale, well compensated at present  and main rx is 02/ rx copd > all other rx per cards/ advised     I had an extended discussion with the patient reviewing all relevant studies completed to date and  lasting 15 to 20 minutes of a 25 minute visit    See device teaching which extended face to face time for this visit.  Each maintenance medication was reviewed in detail including emphasizing most importantly the difference between maintenance and prns and under what circumstances the prns are to be triggered using an action plan format that is not reflected in the computer generated alphabetically organized AVS which I have not found useful in most complex patients, especially with respiratory illnesses  Please see AVS for specific instructions unique to this visit that I personally wrote and verbalized to the the pt in detail and then reviewed with pt  by my nurse highlighting any  changes in therapy recommended at today's visit to their plan of care.

## 2018-07-21 ENCOUNTER — Telehealth (HOSPITAL_COMMUNITY): Payer: Self-pay | Admitting: *Deleted

## 2018-07-21 NOTE — Telephone Encounter (Signed)
Patient called to schedule a f/u appt with DM.  Appt scheduled.

## 2018-08-01 ENCOUNTER — Other Ambulatory Visit (HOSPITAL_COMMUNITY): Payer: Self-pay | Admitting: Cardiology

## 2018-08-08 ENCOUNTER — Other Ambulatory Visit (HOSPITAL_COMMUNITY): Payer: Self-pay | Admitting: Cardiology

## 2018-08-08 DIAGNOSIS — I509 Heart failure, unspecified: Secondary | ICD-10-CM

## 2018-08-19 ENCOUNTER — Other Ambulatory Visit (HOSPITAL_COMMUNITY): Payer: Self-pay | Admitting: Cardiology

## 2018-08-25 ENCOUNTER — Telehealth: Payer: Self-pay | Admitting: Internal Medicine

## 2018-08-25 MED ORDER — FLUTICASONE-UMECLIDIN-VILANT 100-62.5-25 MCG/INH IN AEPB: 1.0000 | INHALATION_SPRAY | Freq: Every day | RESPIRATORY_TRACT | 0 refills | Status: DC

## 2018-08-25 NOTE — Telephone Encounter (Signed)
Called and spoke to patient. We do have some samples. Told her they would be up front for pick up. Patient states her daughter Jaclyn Prime) will pick them up today or tomorrow.   2 samples placed in a brown bag and placed up front in appropriate drawer for patient pick up  Nothing further needed.

## 2018-09-05 IMAGING — MR MR HEAD W/O CM
9 of 10 series · 36 of 48 positions shown · non-contrast
Comparison: None.

CLINICAL DATA: 76 y/o F; altered level of consciousness
unexplained. Confusion and slurred speech.

EXAM:
MRI HEAD WITHOUT CONTRAST
TECHNIQUE: Multiplanar, multiecho pulse sequences of the brain and surrounding
structures were obtained without intravenous contrast.

[Series 3: DWI · axial · 3.0mm · 1.09mm/px · z∈[-97,+41]mm · 9 of 94 slices shown (1 of 4)]
[im 1/94]
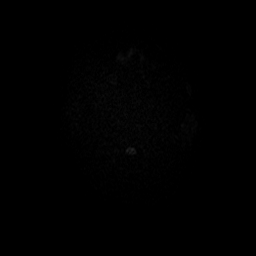
[im 12/94]
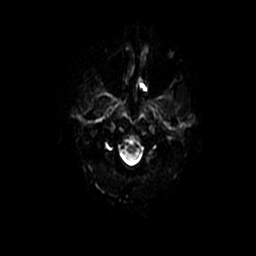
[im 24/94]
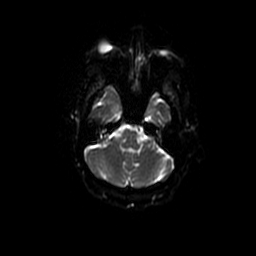
[im 35/94]
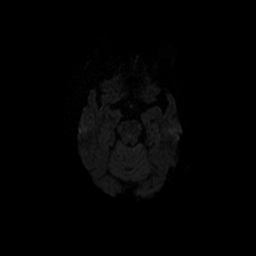
[im 47/94]
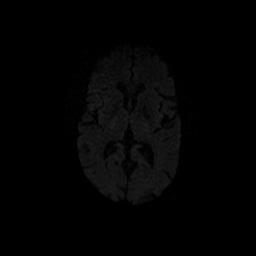
[im 59/94]
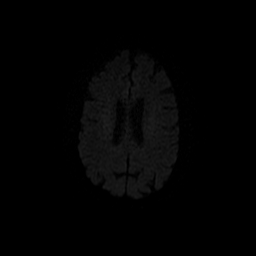
[im 70/94]
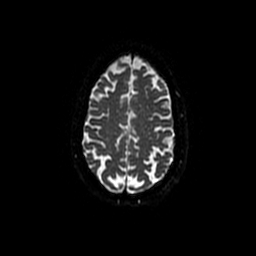
[im 82/94]
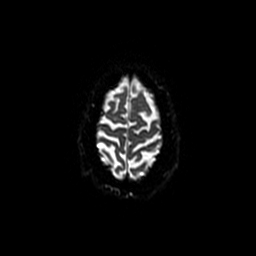
[im 94/94]
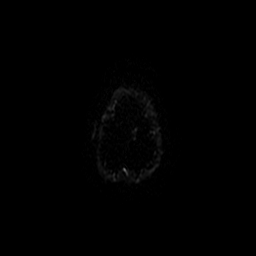

[Series 5: T2 · axial · 5.0mm · 0.43mm/px · z∈[-97,+41]mm · 3 of 24 slices shown (1 of 2)]
[im 1/24]
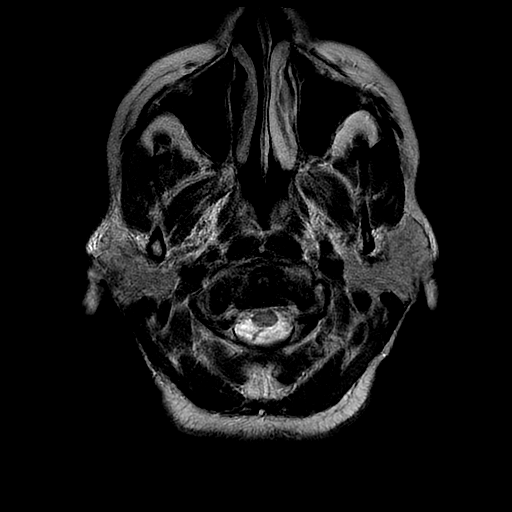
[im 12/24]
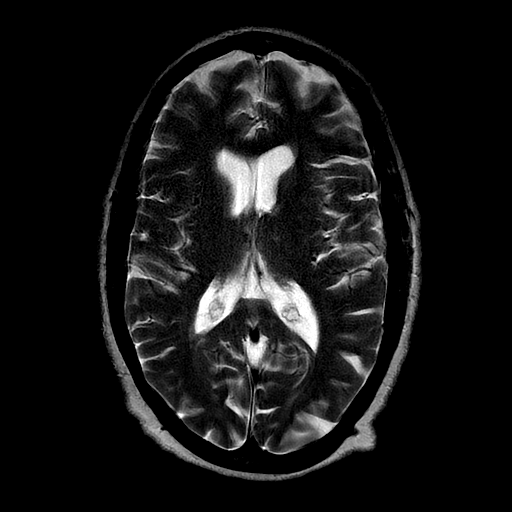
[im 24/24]
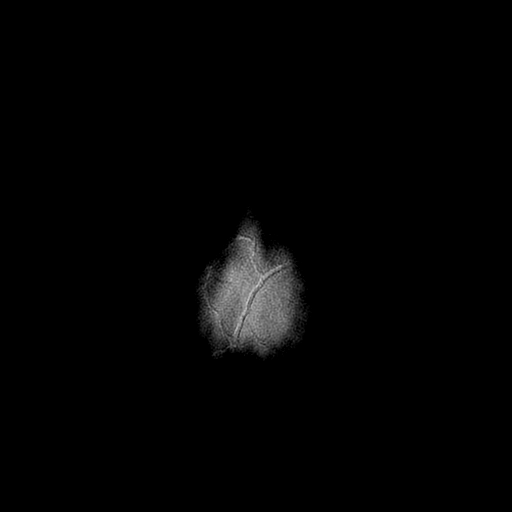

[Series 6: FLAIR · axial · 5.0mm · 0.43mm/px · z∈[-97,+41]mm · 3 of 24 slices shown]
[im 1/24]
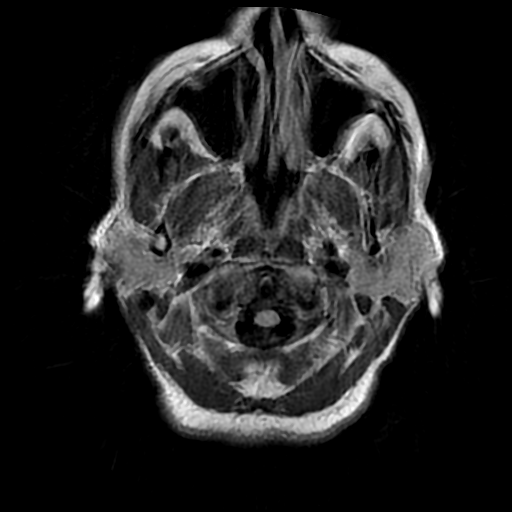
[im 12/24]
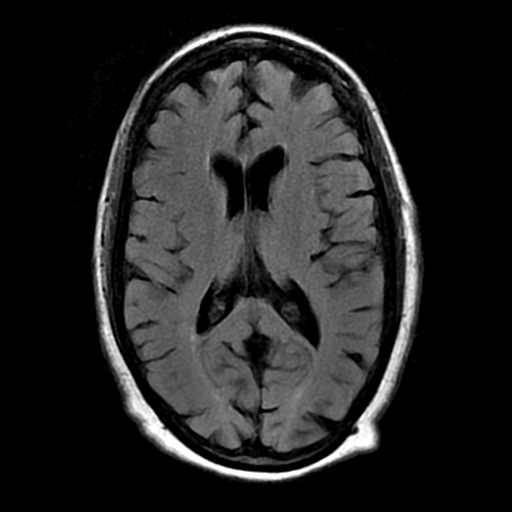
[im 24/24]
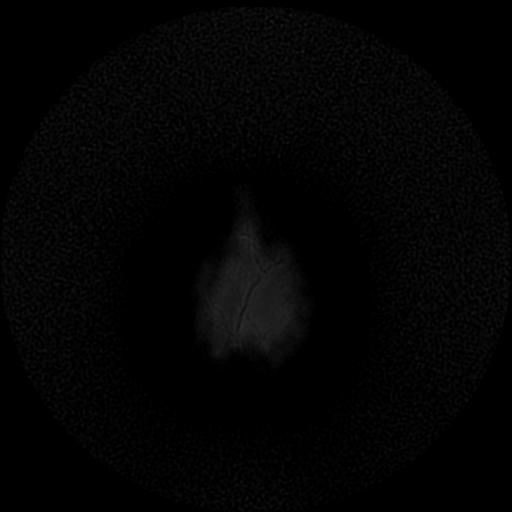

[Series 7: ax mpgr · axial · 5.0mm · 0.43mm/px · 1 of 24 slices shown]
[im 1/24]
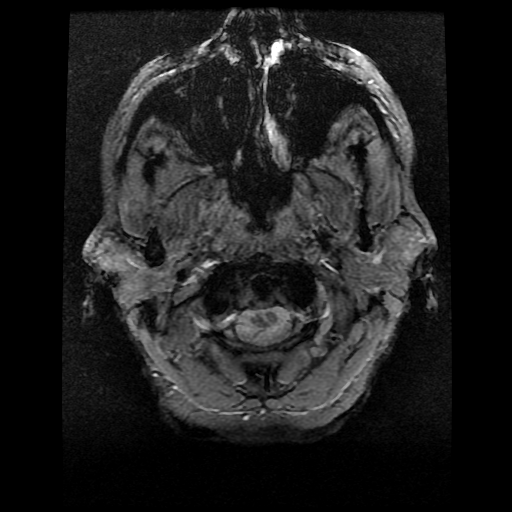

[Series 8: T2 · coronal · 5.0mm · 0.43mm/px · 3 of 28 slices shown (2 of 2)]
[im 1/28]
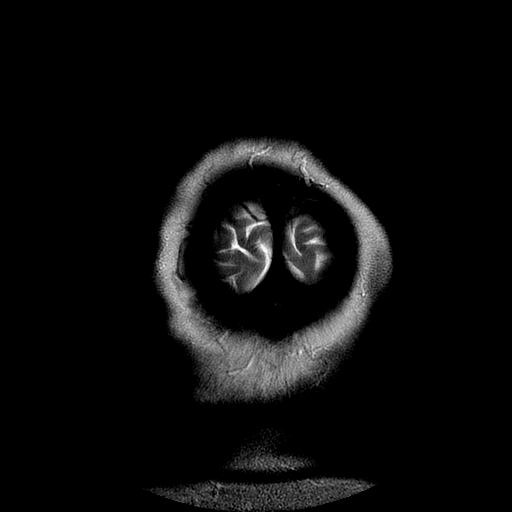
[im 14/28]
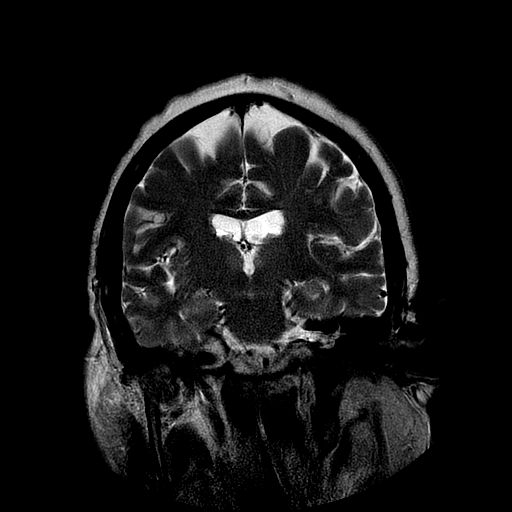
[im 28/28]
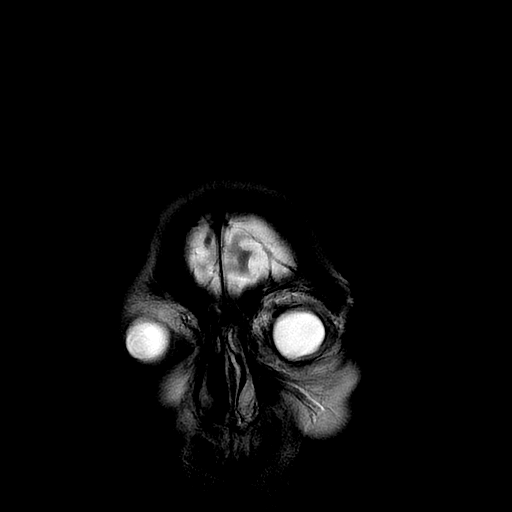

[Series 9: T1 · sagittal · 5.0mm · 0.47mm/px · 2 of 23 slices shown]
[im 1/23]
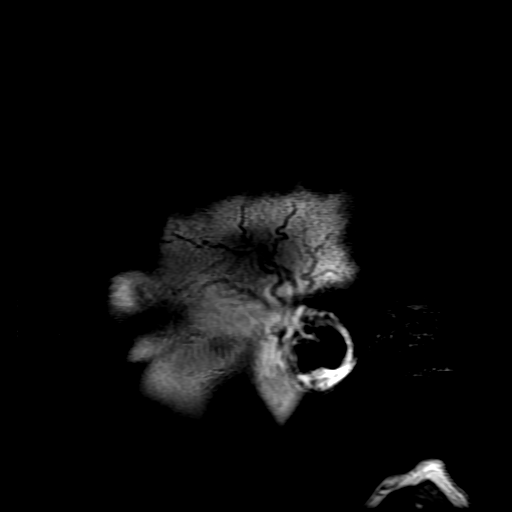
[im 23/23]
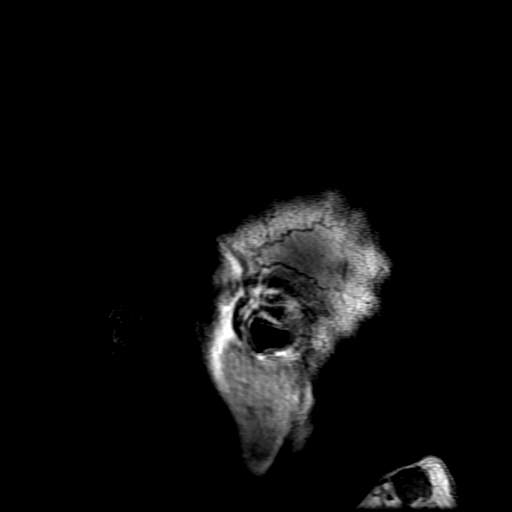

[Series 11: DWI · coronal · 5.0mm · 1.09mm/px · 7 of 66 slices shown (2 of 4)]
[im 1/66]
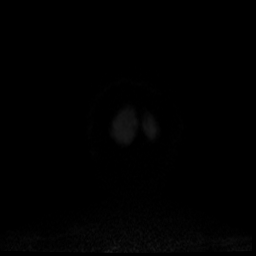
[im 11/66]
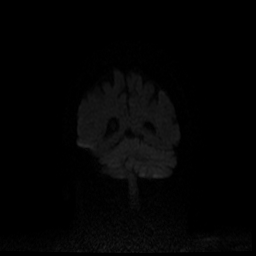
[im 22/66]
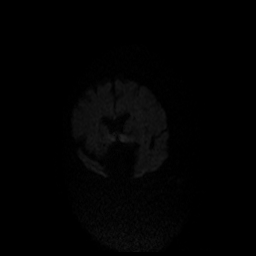
[im 33/66]
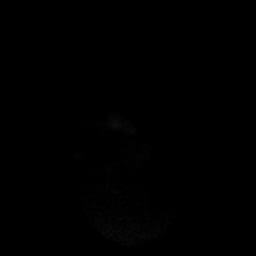
[im 44/66]
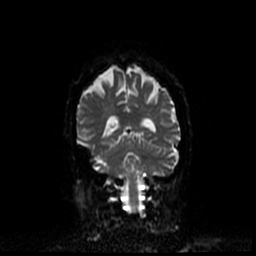
[im 55/66]
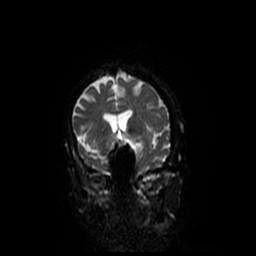
[im 66/66]
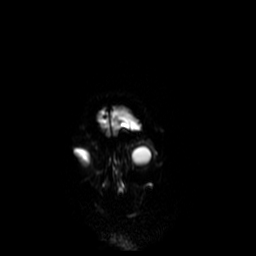

[Series 300: DWI · axial · 3.0mm · 1.09mm/px · z∈[-97,+41]mm · 5 of 47 slices shown (3 of 4)]
[im 1/47]
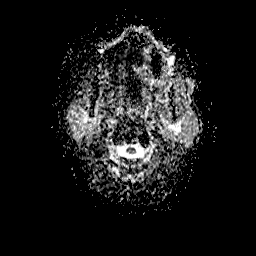
[im 12/47]
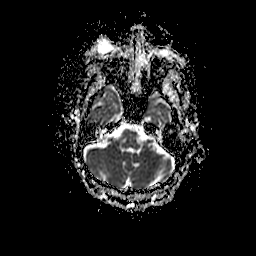
[im 24/47]
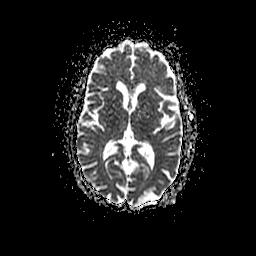
[im 35/47]
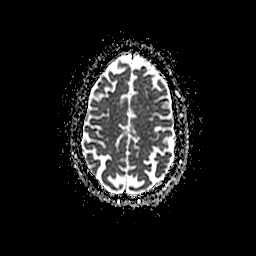
[im 47/47]
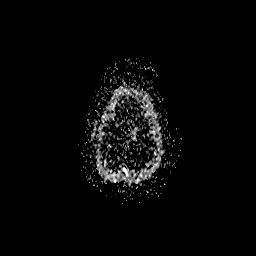

[Series 1100: DWI · coronal · 5.0mm · 1.09mm/px · 3 of 33 slices shown (4 of 4)]
[im 1/33]
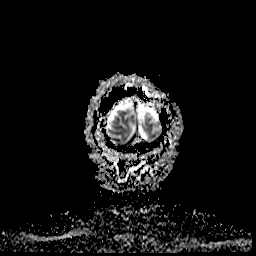
[im 17/33]
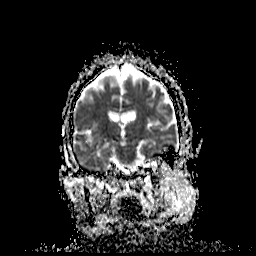
[im 33/33]
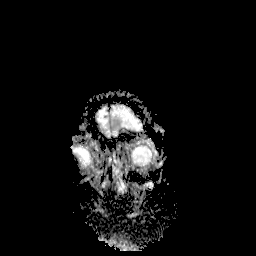

[36 of 48 positions shown; findings below may reference images not displayed]

FINDINGS: Brain: No acute infarction, hemorrhage, hydrocephalus, extra-axial
collection or mass lesion. Few nonspecific foci of T2 FLAIR
hyperintense signal abnormality in subcortical and periventricular
white matter is compatible with mild chronic microvascular ischemic
changes for age. Mild brain parenchymal volume loss.

Vascular: Normal flow voids.

Skull and upper cervical spine: Normal marrow signal.

Sinuses/Orbits: Negative.

Other: Bilateral intra-ocular lens replacement.
IMPRESSION: 1. No acute intracranial abnormality identified.
2. Mild for age chronic microvascular ischemic changes and mild
parenchymal volume loss of the brain.

By: Filiberto Collazo M.D.

## 2018-09-06 ENCOUNTER — Ambulatory Visit (HOSPITAL_COMMUNITY)
Admission: RE | Admit: 2018-09-06 | Discharge: 2018-09-06 | Disposition: A | Discharge status: Home / Self Care | Payer: Medicare HMO | Source: Ambulatory Visit | Attending: Cardiology | Admitting: Cardiology

## 2018-09-06 ENCOUNTER — Other Ambulatory Visit (HOSPITAL_COMMUNITY): Payer: Self-pay

## 2018-09-06 VITALS — BP 126/66 | HR 101 | Wt 131.4 lb

## 2018-09-06 DIAGNOSIS — Z9981 Dependence on supplemental oxygen: Secondary | ICD-10-CM | POA: Insufficient documentation

## 2018-09-06 DIAGNOSIS — I272 Pulmonary hypertension, unspecified: Secondary | ICD-10-CM | POA: Diagnosis present

## 2018-09-06 DIAGNOSIS — I5032 Chronic diastolic (congestive) heart failure: Secondary | ICD-10-CM | POA: Diagnosis not present

## 2018-09-06 DIAGNOSIS — Z7951 Long term (current) use of inhaled steroids: Secondary | ICD-10-CM | POA: Insufficient documentation

## 2018-09-06 DIAGNOSIS — Z87891 Personal history of nicotine dependence: Secondary | ICD-10-CM | POA: Diagnosis not present

## 2018-09-06 DIAGNOSIS — Z7984 Long term (current) use of oral hypoglycemic drugs: Secondary | ICD-10-CM | POA: Diagnosis not present

## 2018-09-06 DIAGNOSIS — I509 Heart failure, unspecified: Secondary | ICD-10-CM

## 2018-09-06 DIAGNOSIS — Z8249 Family history of ischemic heart disease and other diseases of the circulatory system: Secondary | ICD-10-CM | POA: Insufficient documentation

## 2018-09-06 DIAGNOSIS — G4733 Obstructive sleep apnea (adult) (pediatric): Secondary | ICD-10-CM | POA: Insufficient documentation

## 2018-09-06 DIAGNOSIS — E1122 Type 2 diabetes mellitus with diabetic chronic kidney disease: Secondary | ICD-10-CM | POA: Diagnosis not present

## 2018-09-06 DIAGNOSIS — I471 Supraventricular tachycardia: Secondary | ICD-10-CM | POA: Diagnosis not present

## 2018-09-06 DIAGNOSIS — Z79899 Other long term (current) drug therapy: Secondary | ICD-10-CM | POA: Diagnosis not present

## 2018-09-06 DIAGNOSIS — N189 Chronic kidney disease, unspecified: Secondary | ICD-10-CM | POA: Insufficient documentation

## 2018-09-06 DIAGNOSIS — E785 Hyperlipidemia, unspecified: Secondary | ICD-10-CM | POA: Diagnosis not present

## 2018-09-06 DIAGNOSIS — Z8673 Personal history of transient ischemic attack (TIA), and cerebral infarction without residual deficits: Secondary | ICD-10-CM | POA: Insufficient documentation

## 2018-09-06 DIAGNOSIS — I50810 Right heart failure, unspecified: Secondary | ICD-10-CM

## 2018-09-06 DIAGNOSIS — I130 Hypertensive heart and chronic kidney disease with heart failure and stage 1 through stage 4 chronic kidney disease, or unspecified chronic kidney disease: Secondary | ICD-10-CM | POA: Diagnosis not present

## 2018-09-06 DIAGNOSIS — J449 Chronic obstructive pulmonary disease, unspecified: Secondary | ICD-10-CM | POA: Insufficient documentation

## 2018-09-06 LAB — BRAIN NATRIURETIC PEPTIDE: B NATRIURETIC PEPTIDE 5: 13.5 pg/mL (ref 0.0–100.0)

## 2018-09-06 LAB — BASIC METABOLIC PANEL
Anion gap: 9 (ref 5–15)
BUN: 37 mg/dL — AB (ref 8–23)
CALCIUM: 9.7 mg/dL (ref 8.9–10.3)
CO2: 33 mmol/L — ABNORMAL HIGH (ref 22–32)
CREATININE: 0.91 mg/dL (ref 0.44–1.00)
Chloride: 96 mmol/L — ABNORMAL LOW (ref 98–111)
GFR calc Af Amer: >60 mL/min (ref >60)
GFR, EST NON AFRICAN AMERICAN: 59 mL/min — AB (ref >60)
Glucose, Bld: 145 mg/dL — ABNORMAL HIGH (ref 70–99)
Potassium: 3.9 mmol/L (ref 3.5–5.1)
SODIUM: 138 mmol/L (ref 135–145)

## 2018-09-06 LAB — LIPID PANEL
CHOLESTEROL: 235 mg/dL — AB (ref 0–200)
HDL: 90 mg/dL (ref >40)
LDL Cholesterol: 135 mg/dL — ABNORMAL HIGH (ref 0–99)
Total CHOL/HDL Ratio: 2.6 RATIO
Triglycerides: 50 mg/dL (ref <150)
VLDL: 10 mg/dL (ref 0–40)

## 2018-09-06 MED ORDER — ATORVASTATIN CALCIUM 80 MG PO TABS: 80.0000 mg | ORAL_TABLET | Freq: Every day | ORAL | 0 refills | Status: DC

## 2018-09-06 MED ORDER — FUROSEMIDE 80 MG PO TABS: 80.0000 mg | ORAL_TABLET | Freq: Two times a day (BID) | ORAL | 2 refills | Status: DC

## 2018-09-06 NOTE — Progress Notes (Signed)
Patient ID: Beth [name], female   DOB: 1941/09/13, 77 y.o.   MRN: [id number] PCP: Dr Karlton Lemon Cardiology: Dr. Aundra Dubin  77 y.o. with history of COPD on home oxygen, untreated OSA, chronic diastolic CHF/RV failure, and pulmonary hypertension returns for followup of pulmonary hypertension and diastolic CHF.  She was admitted in 5/17 with acute on chronic diastolic CHF with prominent RV failure and marked shortness of breath.  She was diuresed with IV Lasix.  Echo showed dilated and dysfunctional RV with pulmonary hypertension.  RHC showed marked pulmonary hypertension with systemic PA pressure.  She was started on Revatio in the hospital and this was titrated up.  Subsequently, she was started on macitentan.  She next started Selexipag but she had intractable side effects with selexipag so stopped it.  I then tried her on Tyvaso, but she has developed abdominal discomfort and diarrhea with Tyvaso and has had to stop it (symptomatic even at lowest dose). She did not feel like it helped her breathing when she was on it.  She stopped sildenafil and started riociguat. However, she developed painful hand swelling on riociguat and has had to stop it.  Symptoms resolved off riociguat.    She was admitted in 8/18 with hypotension and AKI.  Valsartan and HCTZ stopped.     Patient returns for followup of CHF and pulmonary hypertension.  Weight is up about 7 lbs.  Symptomatically, she is stable.  No dyspnea walking in the house.  She gets short of breath walking longer distances.  She uses a wheelchair when she goes on senior outings.  No lightheadedness, no chest pain.  6 minute walk today, slightly farther than prior test.   ECG (personally reviewed): NSR, RBBB  6 minute walk (7/17): 238 m 6 minute walk (10/17): 171 m 6 minute walk (3/18): 231 m 6 minute walk (11/18): 123 m 6 minute walk (1/19): 213 m 6 minute walk (10/19): 231 m  Labs (5/17): K 4.7 => 4, creatinine 0.96 => 1.04 Labs (8/17): K 4.1, creatinine  1.5 Labs (9/17): K 3.5, creatinine 1.13 Labs (10/17): K 3.6, creatinine 1.0 Labs (1/18): K 3.9, creatinine 1.14, BNP 34 Labs (2/18): K 4.1, creatinine 1.07, BNP 25 Labs (3/18): K 3.8, creatinine 1.06, BNP 25 Labs (8/18): K 4.6, creatinine 1.38, hgb 9.1 Labs (9/18): BNP 8.7 Labs (11/18): BNP 36, K 3.6, creatinine 0.65  PMH: 1. H/o CVA 2. DM 3. HTN 4. Hyperlipidemia 5. OSA: Cannot tolerate CPAP, has tried multiple masks.  6. COPD: She is on home oxygen. 7. SVT 8. Chronic diastolic CHF with RV failure: Echo (5/17) with EF 55%, severely dilated RV with moderately decreased systolic function, D-shaped interventricular septum.  - Echo (7/18): EF 60-65%, D-shaped interventricular septum, mild RV dilation with normal systolic function, PASP 41, IVC normal, mild MR, mild AS.  9. 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  V/Q scan 5/17 with no evidence for chronic PE.  ANA weakly positive (1:80), ANCA negative, RF negative.  RHC (5/17) with mean RA 20, PA 110/45 mean 66, mean PCWP 20, CI 1.7, PVR 15 WU.  - Intractable side effects with selexipag.  - Did not tolerate Tyvaso - Did not tolerate riociguat.  10. CKD  Social History   Socioeconomic History  . Marital status: Widowed    Spouse name: Not on file  . Number of children: 2  . Years of education: Not on file  .  Highest education level: Not on file  Occupational History  . Occupation: retired    Fish farm manager: OTHER    Comment: [employer]  Social Needs  . Financial resource strain: Not on file  . Food insecurity:    Worry: Not on file    Inability: Not on file  . Transportation needs:    Medical: Not on file    Non-medical: Not on file  Tobacco Use  . Smoking status: Former Smoker    Packs/day: 1.50    Years: 40.00    Pack years: 60.00    Types: Cigarettes    Last attempt to quit: 11/16/2004    Years since quitting: 13.8   . Smokeless tobacco: Former Network engineer and Sexual Activity  . Alcohol use: No    Comment: occassional  . Drug use: No  . Sexual activity: Not on file  Lifestyle  . Physical activity:    Days per week: Not on file    Minutes per session: Not on file  . Stress: Not on file  Relationships  . Social connections:    Talks on phone: Not on file    Gets together: Not on file    Attends religious service: Not on file    Active member of club or organization: Not on file    Attends meetings of clubs or organizations: Not on file    Relationship status: Not on file  . Intimate partner violence:    Fear of current or ex partner: Not on file    Emotionally abused: Not on file    Physically abused: Not on file    Forced sexual activity: Not on file  Other Topics Concern  . Not on file  Social History Narrative  . Not on file   Family History  Problem Relation Age of Onset  . Heart disease Mother   . Heart failure Mother   . Heart disease Brother   . Heart disease Brother   . Heart disease Sister   . Heart disease Sister   . Heart failure Father    ROS: All systems reviewed and negative except as per HPI.   Current Outpatient Medications  Medication Sig Dispense Refill  . allopurinol (ZYLOPRIM) 300 MG tablet Take 300 mg by mouth daily.    . ASPIRIN ADULT LOW STRENGTH 81 MG EC tablet TAKE 1 TABLET BY MOUTH EVERY DAY 120 tablet 2  . b complex vitamins tablet Take 1 tablet by mouth daily.    . Cholecalciferol (VITAMIN D3) 1000 units CAPS Take 1,000 Units by mouth daily.    Marland Kitchen co-enzyme Q-10 30 MG capsule Take 30 mg by mouth daily.    . Fluticasone-Umeclidin-Vilant (TRELEGY ELLIPTA) 100-62.5-25 MCG/INH AEPB Inhale 1 puff into the lungs daily. 1 each 0  . furosemide (LASIX) 80 MG tablet Take 1 tablet (80 mg total) by mouth 2 (two) times daily. 180 tablet 2  . KLOR-CON M20 20 MEQ tablet TAKE 2 TABLETS BY MOUTH EVERY DAY 180 tablet 1  . Macitentan (OPSUMIT) 10 MG TABS Take 1  tablet (10 mg total) by mouth daily. 30 tablet 11  . metFORMIN (GLUCOPHAGE) 500 MG tablet Take 500 mg by mouth 2 (two) times daily with a meal.    . Omega 3 1000 MG CAPS Take 1 capsule by mouth daily.    Marland Kitchen omeprazole (PRILOSEC) 40 MG capsule Take 40 mg by mouth daily.    . OXYGEN Inhale 2 L into the lungs continuous.    Marland Kitchen  sildenafil (REVATIO) 20 MG tablet TAKE 2 TABLETS BY MOUTH 3 TIMES DAILY. 180 tablet 3  . albuterol (PROAIR HFA) 108 (90 Base) MCG/ACT inhaler 2 puffs every 4 hours as needed only  if your can't catch your breath (Patient not taking: Reported on 09/06/2018) 1 Inhaler 2  . albuterol (PROVENTIL) (2.5 MG/3ML) 0.083% nebulizer solution Take 2.5 mg by nebulization every 6 (six) hours as needed for wheezing or shortness of breath.    Marland Kitchen atorvastatin (LIPITOR) 80 MG tablet Take 1 tablet (80 mg total) by mouth daily. 90 tablet 0  . loratadine (CLARITIN) 10 MG tablet Take 10 mg by mouth daily.     No current facility-administered medications for this encounter.    BP 126/66   Pulse (!) 101   Wt 59.6 kg (131 lb 6.4 oz)   SpO2 95% Comment: 2L  BMI 24.03 kg/m    Wt Readings from Last 3 Encounters:  09/06/18 59.6 kg (131 lb 6.4 oz)  06/28/18 58.4 kg (128 lb 12.8 oz)  12/27/17 56.9 kg (125 lb 6.4 oz)   General: NAD Neck: JVP 8-9 cm, +HJR, no thyromegaly or thyroid nodule.  Lungs: Clear to auscultation bilaterally with normal respiratory effort. CV: Nondisplaced PMI.  Heart regular S1/S2, no S3/S4, no murmur.  No peripheral edema.  No carotid bruit.  Normal pedal pulses.  Abdomen: Soft, nontender, no hepatosplenomegaly, no distention.  Skin: Intact without lesions or rashes.  Neurologic: Alert and oriented x 3.  Psych: Normal affect. Extremities: No clubbing or cyanosis.  HEENT: Normal.   Assessment/Plan: 1. 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 She was very short of breath  with exertion and had very elevated right-sided filling pressures on 5/17 RHC.  PA pressure was systemic. Cardiac output low, but not moving towards IV Flolan given the mixed etiology of her PH. V/Q scan not suggestive of chronic PEs and autoimmune serologies sent (RF negative, ANCA negative, ANA only weakly positive (1:80).She has been unable to tolerate selexipag, Tyvaso, or riociguat. Echo in 7/18 appeared to show some improvement in RV function. 6 minute walk a bit improved today.  - Continue sildenafil.   - Continue Opsumit 10 mg daily.  - BNP today.  - She is due for repeat echo, will arrange.  2. COPD: On home oxygen.  Prior long-time smoker.  3. Chronic diastolic CHF with prominent RV failure: Weight is up and she looks mildly volume overloaded today.  - Increase Lasix to 80 mg bid.      - BMET today and again in 10 days.  4. SVT: No palpitations.   5. OSA: Has been unable to tolerate CPAP.  She uses oxygen at night.  OSA may have small contribution to [facility] but does not explain the extent of PAH.   Followup in 1 month with APP.    Loralie Champagne 09/06/2018

## 2018-09-06 NOTE — Patient Instructions (Addendum)
Increase Furosemide to 80 mg Twice daily   Labs today  Labs in 1-2 weeks   Your physician has requested that you have an echocardiogram. Echocardiography is a painless test that uses sound waves to create images of your heart. It provides your doctor with information about the size and shape of your heart and how well your heart's chambers and valves are working. This procedure takes approximately one hour. There are no restrictions for this procedure.  Your physician recommends that you schedule a follow-up appointment in: 1 month

## 2018-09-06 NOTE — Progress Notes (Signed)
6 min walk test completed, pt ambulated 760 ft (231 m).  On 2 L O2 sats ranged 90-96% and HR ranged 93-141.

## 2018-09-12 ENCOUNTER — Other Ambulatory Visit (HOSPITAL_COMMUNITY): Payer: Self-pay | Admitting: Cardiology

## 2018-09-12 DIAGNOSIS — I509 Heart failure, unspecified: Secondary | ICD-10-CM

## 2018-09-20 ENCOUNTER — Ambulatory Visit (HOSPITAL_COMMUNITY)
Admission: RE | Admit: 2018-09-20 | Discharge: 2018-09-20 | Disposition: A | Discharge status: Home / Self Care | Payer: Medicare HMO | Source: Ambulatory Visit | Attending: Internal Medicine | Admitting: Internal Medicine

## 2018-09-20 ENCOUNTER — Ambulatory Visit (HOSPITAL_COMMUNITY)
Admission: RE | Admit: 2018-09-20 | Discharge: 2018-09-20 | Disposition: A | Discharge status: Home / Self Care | Payer: Medicare HMO | Source: Ambulatory Visit | Attending: Cardiology | Admitting: Cardiology

## 2018-09-20 ENCOUNTER — Other Ambulatory Visit (HOSPITAL_COMMUNITY): Payer: Self-pay | Admitting: Pharmacist

## 2018-09-20 DIAGNOSIS — I110 Hypertensive heart disease with heart failure: Secondary | ICD-10-CM | POA: Insufficient documentation

## 2018-09-20 DIAGNOSIS — E785 Hyperlipidemia, unspecified: Secondary | ICD-10-CM | POA: Diagnosis not present

## 2018-09-20 DIAGNOSIS — I5032 Chronic diastolic (congestive) heart failure: Secondary | ICD-10-CM

## 2018-09-20 DIAGNOSIS — J449 Chronic obstructive pulmonary disease, unspecified: Secondary | ICD-10-CM | POA: Diagnosis not present

## 2018-09-20 DIAGNOSIS — I253 Aneurysm of heart: Secondary | ICD-10-CM | POA: Insufficient documentation

## 2018-09-20 DIAGNOSIS — Z87891 Personal history of nicotine dependence: Secondary | ICD-10-CM | POA: Diagnosis not present

## 2018-09-20 DIAGNOSIS — I272 Pulmonary hypertension, unspecified: Secondary | ICD-10-CM

## 2018-09-20 DIAGNOSIS — E119 Type 2 diabetes mellitus without complications: Secondary | ICD-10-CM | POA: Insufficient documentation

## 2018-09-20 DIAGNOSIS — J810 Acute pulmonary edema: Secondary | ICD-10-CM

## 2018-09-20 LAB — BASIC METABOLIC PANEL
ANION GAP: 9 (ref 5–15)
BUN: 43 mg/dL — ABNORMAL HIGH (ref 8–23)
CO2: 30 mmol/L (ref 22–32)
Calcium: 9.7 mg/dL (ref 8.9–10.3)
Chloride: 98 mmol/L (ref 98–111)
Creatinine, Ser: 0.9 mg/dL (ref 0.44–1.00)
GFR calc non Af Amer: >60 mL/min (ref >60)
GLUCOSE: 130 mg/dL — AB (ref 70–99)
Potassium: 3.5 mmol/L (ref 3.5–5.1)
Sodium: 137 mmol/L (ref 135–145)

## 2018-09-20 MED ORDER — MACITENTAN 10 MG PO TABS: 10.0000 mg | ORAL_TABLET | Freq: Every day | ORAL | 11 refills | Status: DC

## 2018-09-20 NOTE — Progress Notes (Signed)
  Echocardiogram 2D Echocardiogram has been performed.  Jannett Celestine 09/20/2018, 10:04 AM

## 2018-10-11 NOTE — Progress Notes (Signed)
Patient ID: Beth [name], female   DOB: [birth year], 77 y.o.   MRN: [id number]   Advanced Heart Failure Clinic Note PCP: Dr Karlton Lemon Cardiology: Dr. Aundra Dubin  77 y.o. with history of COPD on home oxygen, untreated OSA, chronic diastolic CHF/RV failure, and pulmonary hypertension returns for followup of pulmonary hypertension and diastolic CHF.  She was admitted in 5/17 with acute on chronic diastolic CHF with prominent RV failure and marked shortness of breath.  She was diuresed with IV Lasix.  Echo showed dilated and dysfunctional RV with pulmonary hypertension.  RHC showed marked pulmonary hypertension with systemic PA pressure.  She was started on Revatio in the hospital and this was titrated up.  Subsequently, she was started on macitentan.  She next started Selexipag but she had intractable side effects with selexipag so stopped it.  I then tried her on Tyvaso, but she has developed abdominal discomfort and diarrhea with Tyvaso and has had to stop it (symptomatic even at lowest dose). She did not feel like it helped her breathing when she was on it.  She stopped sildenafil and started riociguat. However, she developed painful hand swelling on riociguat and has had to stop it.  Symptoms resolved off riociguat.    She was admitted in 8/18 with hypotension and AKI.  Valsartan and HCTZ stopped.     She returns today for HF and PAH follow up. Last visit lasix was increased with 7 lb weight gain. Repeat echo scheduled and showed EF 60-65% and normal RV. Lipitor was also increased. Overall doing much better. She is less SOB and says she can do ADLs and get around the house with no problems. She gets SOB with walking longer distances on flat ground, but has improved with increased lasix. Denies edema, orthopnea, or PND. Denies dizziness or palpitations. Decreased appetite. Limits fluid and salt intake. SBP 130/70s at home. Weight only down 1 lb on our scale, but down from 131 to 126 lbs on her home scale.   ECG  (personally reviewed): NSR, RBBB  6 minute walk (7/17): 238 m 6 minute walk (10/17): 171 m 6 minute walk (3/18): 231 m 6 minute walk (11/18): 123 m 6 minute walk (1/19): 213 m 6 minute walk (10/19): 231 m  Labs (5/17): K 4.7 => 4, creatinine 0.96 => 1.04 Labs (8/17): K 4.1, creatinine 1.5 Labs (9/17): K 3.5, creatinine 1.13 Labs (10/17): K 3.6, creatinine 1.0 Labs (1/18): K 3.9, creatinine 1.14, BNP 34 Labs (2/18): K 4.1, creatinine 1.07, BNP 25 Labs (3/18): K 3.8, creatinine 1.06, BNP 25 Labs (8/18): K 4.6, creatinine 1.38, hgb 9.1 Labs (9/18): BNP 8.7 Labs (11/18): BNP 36, K 3.6, creatinine 0.65  PMH: 1. H/o CVA 2. DM 3. HTN 4. Hyperlipidemia 5. OSA: Cannot tolerate CPAP, has tried multiple masks.  6. COPD: She is on home oxygen. 7. SVT 8. Chronic diastolic CHF with RV failure: Echo (5/17) with EF 55%, severely dilated RV with moderately decreased systolic function, D-shaped interventricular septum.  - Echo (7/18): EF 60-65%, D-shaped interventricular septum, mild RV dilation with normal systolic function, PASP 41, IVC normal, mild MR, mild AS.  9. 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  V/Q scan 5/17 with no evidence for chronic PE.  ANA weakly positive (1:80), ANCA negative, RF negative.  RHC (5/17) with mean RA 20, PA 110/45 mean 66, mean PCWP 20, CI 1.7, PVR 15 WU.  -  Intractable side effects with selexipag.  - Did not tolerate Tyvaso - Did not tolerate riociguat.  10. CKD  Social History   Socioeconomic History  . Marital status: Widowed    Spouse name: Not on file  . Number of children: 2  . Years of education: Not on file  . Highest education level: Not on file  Occupational History  . Occupation: retired    Fish farm manager: OTHER    Comment: [employer]  Social Needs  . Financial resource strain: Not on file  . Food insecurity:    Worry: Not on file     Inability: Not on file  . Transportation needs:    Medical: Not on file    Non-medical: Not on file  Tobacco Use  . Smoking status: Former Smoker    Packs/day: 1.50    Years: 40.00    Pack years: 60.00    Types: Cigarettes    Last attempt to quit: 11/16/2004    Years since quitting: 13.9  . Smokeless tobacco: Former Network engineer and Sexual Activity  . Alcohol use: No    Comment: occassional  . Drug use: No  . Sexual activity: Not on file  Lifestyle  . Physical activity:    Days per week: Not on file    Minutes per session: Not on file  . Stress: Not on file  Relationships  . Social connections:    Talks on phone: Not on file    Gets together: Not on file    Attends religious service: Not on file    Active member of club or organization: Not on file    Attends meetings of clubs or organizations: Not on file    Relationship status: Not on file  . Intimate partner violence:    Fear of current or ex partner: Not on file    Emotionally abused: Not on file    Physically abused: Not on file    Forced sexual activity: Not on file  Other Topics Concern  . Not on file  Social History Narrative  . Not on file   Family History  Problem Relation Age of Onset  . Heart disease Mother   . Heart failure Mother   . Heart disease Brother   . Heart disease Brother   . Heart disease Sister   . Heart disease Sister   . Heart failure Father    Review of systems complete and found to be negative unless listed in HPI.   Current Outpatient Medications  Medication Sig Dispense Refill  . albuterol (PROAIR HFA) 108 (90 Base) MCG/ACT inhaler 2 puffs every 4 hours as needed only  if your can't catch your breath 1 Inhaler 2  . albuterol (PROVENTIL) (2.5 MG/3ML) 0.083% nebulizer solution Take 2.5 mg by nebulization every 6 (six) hours as needed for wheezing or shortness of breath.    . allopurinol (ZYLOPRIM) 300 MG tablet Take 300 mg by mouth daily.    . ASPIRIN ADULT LOW STRENGTH 81 MG EC  tablet TAKE 1 TABLET BY MOUTH EVERY DAY 120 tablet 2  . atorvastatin (LIPITOR) 80 MG tablet Take 1 tablet (80 mg total) by mouth daily. 90 tablet 0  . b complex vitamins tablet Take 1 tablet by mouth daily.    . Cholecalciferol (VITAMIN D3) 1000 units CAPS Take 1,000 Units by mouth daily.    Marland Kitchen co-enzyme Q-10 30 MG capsule Take 30 mg by mouth daily.    . Fluticasone-Umeclidin-Vilant (TRELEGY ELLIPTA) 100-62.5-25 MCG/INH AEPB  Inhale 1 puff into the lungs daily. 1 each 0  . furosemide (LASIX) 80 MG tablet Take 1 tablet (80 mg total) by mouth 2 (two) times daily. 180 tablet 2  . KLOR-CON M20 20 MEQ tablet TAKE 2 TABLETS BY MOUTH EVERY DAY 180 tablet 1  . loratadine (CLARITIN) 10 MG tablet Take 10 mg by mouth daily.    . macitentan (OPSUMIT) 10 MG tablet Take 1 tablet (10 mg total) by mouth daily. 30 tablet 11  . metFORMIN (GLUCOPHAGE) 500 MG tablet Take 500 mg by mouth 2 (two) times daily with a meal.    . Omega 3 1000 MG CAPS Take 1 capsule by mouth daily.    Marland Kitchen omeprazole (PRILOSEC) 40 MG capsule Take 40 mg by mouth daily.    . OXYGEN Inhale 2 L into the lungs continuous.    . sildenafil (REVATIO) 20 MG tablet TAKE 2 TABLETS BY MOUTH 3 TIMES DAILY. 180 tablet 3   No current facility-administered medications for this encounter.    BP 138/90   Pulse (!) 106   Wt 59.1 kg (130 lb 3.2 oz)   SpO2 96% Comment: On 2L of O2  BMI 23.81 kg/m    Wt Readings from Last 3 Encounters:  10/12/18 59.1 kg (130 lb 3.2 oz)  09/06/18 59.6 kg (131 lb 6.4 oz)  06/28/18 58.4 kg (128 lb 12.8 oz)   General: No resp difficulty. Arrived in wheelchair HEENT: Normal Neck: Supple. JVP 5-6. Carotids 2+ bilat; no bruits. No thyromegaly or nodule noted. Cor: PMI nondisplaced. RRR, slightly tachy, No M/G/R noted Lungs: CTAB, normal effort. Abdomen: Soft, non-tender, non-distended, no HSM. No bruits or masses. +BS  Extremities: No cyanosis, clubbing, or rash. R and LLE no edema.  Neuro: Alert & orientedx3, cranial  nerves grossly intact. moves all 4 extremities w/o difficulty. Affect pleasant   Assessment/Plan: 1. 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 She was very short of breath with exertion and had very elevated right-sided filling pressures on 5/17 RHC.  PA pressure was systemic. Cardiac output low, but not moving towards IV Flolan given the mixed etiology of her PH. V/Q scan not suggestive of chronic PEs and autoimmune serologies sent (RF negative, ANCA negative, ANA only weakly positive (1:80).She has been unable to tolerate selexipag, Tyvaso, or riociguat. Echo in 7/18 appeared to show some improvement in RV function.  - Echo 11/19 showed normal RV function, mild AS, EF 60-65%, unable to estimate PA pressure - Continue sildenafil 40 mg TID - Continue Opsumit 10 mg daily.  - Refuses 6 minutes walk today. She says she has medication being delivered to her house and needs to get home.  2. COPD: On home oxygen.  Prior long-time smoker. No change.  3. Chronic diastolic CHF with prominent RV failure:  - Echo 09/2018: EF 60-65%, RV normal - NYHA class III - Volume stable on exam. Weight down 5 lbs on home scale.  - Continue Lasix 80 mg bid.  Renal function stable after increase.  4. SVT: Denies palpitations.   5. OSA: Has been unable to tolerate CPAP.  She uses oxygen at night.  OSA may have small contribution to [facility] but does not explain the extent of PAH.  6. Hyperlipidemia - Continue statin. Increased in October with elevated LDL. Plan to recheck next month. Appointment for labs already made. 7. HTN - BP slightly elevated today. She says this normally  happens at appointments, but runs okay at home (sBP 120-130s). Asked her to monitor BP and let us know if SBP >140.  Volume and symptoms much improved. Labs stable after increase in lasix. Follow up with Aundra Dubin in 8 weeks.   Georgiana Shore,  NP 10/12/2018  Greater than 50% of the 15 minute visit was spent in counseling/coordination of care regarding disease state education, salt/fluid restriction, sliding scale diuretics, and medication compliance.

## 2018-10-12 ENCOUNTER — Encounter (HOSPITAL_COMMUNITY): Payer: Self-pay

## 2018-10-12 ENCOUNTER — Ambulatory Visit (HOSPITAL_COMMUNITY)
Admission: RE | Admit: 2018-10-12 | Discharge: 2018-10-12 | Disposition: A | Discharge status: Home / Self Care | Payer: Medicare HMO | Source: Ambulatory Visit | Attending: Internal Medicine | Admitting: Internal Medicine

## 2018-10-12 ENCOUNTER — Other Ambulatory Visit: Payer: Self-pay

## 2018-10-12 VITALS — BP 138/90 | HR 106 | Wt 130.2 lb

## 2018-10-12 DIAGNOSIS — E1122 Type 2 diabetes mellitus with diabetic chronic kidney disease: Secondary | ICD-10-CM | POA: Insufficient documentation

## 2018-10-12 DIAGNOSIS — I130 Hypertensive heart and chronic kidney disease with heart failure and stage 1 through stage 4 chronic kidney disease, or unspecified chronic kidney disease: Secondary | ICD-10-CM | POA: Insufficient documentation

## 2018-10-12 DIAGNOSIS — N189 Chronic kidney disease, unspecified: Secondary | ICD-10-CM | POA: Insufficient documentation

## 2018-10-12 DIAGNOSIS — I10 Essential (primary) hypertension: Secondary | ICD-10-CM

## 2018-10-12 DIAGNOSIS — J449 Chronic obstructive pulmonary disease, unspecified: Secondary | ICD-10-CM | POA: Diagnosis not present

## 2018-10-12 DIAGNOSIS — E785 Hyperlipidemia, unspecified: Secondary | ICD-10-CM | POA: Diagnosis not present

## 2018-10-12 DIAGNOSIS — Z9981 Dependence on supplemental oxygen: Secondary | ICD-10-CM | POA: Insufficient documentation

## 2018-10-12 DIAGNOSIS — Z8249 Family history of ischemic heart disease and other diseases of the circulatory system: Secondary | ICD-10-CM | POA: Insufficient documentation

## 2018-10-12 DIAGNOSIS — Z7982 Long term (current) use of aspirin: Secondary | ICD-10-CM | POA: Insufficient documentation

## 2018-10-12 DIAGNOSIS — I471 Supraventricular tachycardia: Secondary | ICD-10-CM | POA: Diagnosis not present

## 2018-10-12 DIAGNOSIS — G4733 Obstructive sleep apnea (adult) (pediatric): Secondary | ICD-10-CM | POA: Diagnosis not present

## 2018-10-12 DIAGNOSIS — Z87891 Personal history of nicotine dependence: Secondary | ICD-10-CM | POA: Diagnosis not present

## 2018-10-12 DIAGNOSIS — I272 Pulmonary hypertension, unspecified: Secondary | ICD-10-CM | POA: Insufficient documentation

## 2018-10-12 DIAGNOSIS — Z7984 Long term (current) use of oral hypoglycemic drugs: Secondary | ICD-10-CM | POA: Insufficient documentation

## 2018-10-12 DIAGNOSIS — Z8673 Personal history of transient ischemic attack (TIA), and cerebral infarction without residual deficits: Secondary | ICD-10-CM | POA: Insufficient documentation

## 2018-10-12 DIAGNOSIS — I5032 Chronic diastolic (congestive) heart failure: Secondary | ICD-10-CM | POA: Insufficient documentation

## 2018-10-12 DIAGNOSIS — Z79899 Other long term (current) drug therapy: Secondary | ICD-10-CM | POA: Insufficient documentation

## 2018-10-12 NOTE — Patient Instructions (Signed)
Follow up in 8 weeks 

## 2018-10-26 ENCOUNTER — Other Ambulatory Visit: Payer: Self-pay | Admitting: Internal Medicine

## 2018-10-26 MED ORDER — FLUTICASONE-UMECLIDIN-VILANT 100-62.5-25 MCG/INH IN AEPB: 1.0000 | INHALATION_SPRAY | Freq: Every day | RESPIRATORY_TRACT | 5 refills | Status: DC

## 2018-10-26 NOTE — Telephone Encounter (Signed)
Received faxed refill request from [facility]  Medication name/strength/dose: Trelegy  Patient last seen in the office on 8.13.19  Does refill need to be authorized by a provider? no Refill authorized (yes or no)?: yes

## 2018-11-07 ENCOUNTER — Ambulatory Visit (HOSPITAL_COMMUNITY)
Admission: RE | Admit: 2018-11-07 | Discharge: 2018-11-07 | Disposition: A | Discharge status: Home / Self Care | Payer: Medicare HMO | Source: Ambulatory Visit | Attending: Internal Medicine | Admitting: Internal Medicine

## 2018-11-07 ENCOUNTER — Other Ambulatory Visit (HOSPITAL_COMMUNITY): Payer: Self-pay | Admitting: *Deleted

## 2018-11-07 DIAGNOSIS — I5022 Chronic systolic (congestive) heart failure: Secondary | ICD-10-CM | POA: Insufficient documentation

## 2018-11-07 LAB — HEPATIC FUNCTION PANEL
ALBUMIN: 3.8 g/dL (ref 3.5–5.0)
ALT: 19 U/L (ref 0–44)
AST: 24 U/L (ref 15–41)
Alkaline Phosphatase: 57 U/L (ref 38–126)
BILIRUBIN TOTAL: 0.3 mg/dL (ref 0.3–1.2)
Bilirubin, Direct: 0.1 mg/dL (ref 0.0–0.2)
Indirect Bilirubin: 0.2 mg/dL — ABNORMAL LOW (ref 0.3–0.9)
Total Protein: 6.9 g/dL (ref 6.5–8.1)

## 2018-11-07 LAB — LIPID PANEL
Cholesterol: 185 mg/dL (ref 0–200)
HDL: 83 mg/dL (ref >40)
LDL Cholesterol: 91 mg/dL (ref 0–99)
TRIGLYCERIDES: 56 mg/dL (ref <150)
Total CHOL/HDL Ratio: 2.2 RATIO
VLDL: 11 mg/dL (ref 0–40)

## 2018-11-23 ENCOUNTER — Other Ambulatory Visit (HOSPITAL_COMMUNITY): Payer: Self-pay | Admitting: Cardiology

## 2018-11-24 ENCOUNTER — Telehealth (HOSPITAL_COMMUNITY): Payer: Self-pay

## 2018-11-24 NOTE — Telephone Encounter (Signed)
PA initiated for renewal of Opsumit

## 2018-11-25 ENCOUNTER — Telehealth (HOSPITAL_COMMUNITY): Payer: Self-pay

## 2018-11-25 NOTE — Telephone Encounter (Signed)
Prior authorization through [place] was APPROVED for [place] and will expire on 11/16/2019.

## 2018-11-30 ENCOUNTER — Other Ambulatory Visit (HOSPITAL_COMMUNITY): Payer: Self-pay | Admitting: Cardiology

## 2018-12-02 ENCOUNTER — Other Ambulatory Visit (HOSPITAL_COMMUNITY): Payer: Self-pay

## 2018-12-02 DIAGNOSIS — J810 Acute pulmonary edema: Secondary | ICD-10-CM

## 2018-12-02 MED ORDER — MACITENTAN 10 MG PO TABS: 10.0000 mg | ORAL_TABLET | Freq: Every day | ORAL | 11 refills | Status: DC

## 2018-12-06 NOTE — Progress Notes (Signed)
Patient ID: Beth [name], female   DOB: December 04, 1940, 78 y.o.   MRN: [id number]   Advanced Heart Failure Clinic Note PCP: Dr Karlton Lemon Cardiology: Dr. Aundra Dubin  78 y.o. with history of COPD on home oxygen, untreated OSA, chronic diastolic CHF/RV failure, and pulmonary hypertension returns for followup of pulmonary hypertension and diastolic CHF.  She was admitted in 5/17 with acute on chronic diastolic CHF with prominent RV failure and marked shortness of breath.  She was diuresed with IV Lasix.  Echo showed dilated and dysfunctional RV with pulmonary hypertension.  RHC showed marked pulmonary hypertension with systemic PA pressure.  She was started on Revatio in the hospital and this was titrated up.  Subsequently, she was started on macitentan.  She next started Selexipag but she had intractable side effects with selexipag so stopped it.  I then tried her on Tyvaso, but she has developed abdominal discomfort and diarrhea with Tyvaso and has had to stop it (symptomatic even at lowest dose). She did not feel like it helped her breathing when she was on it.  She stopped sildenafil and started riociguat. However, she developed painful hand swelling on riociguat and has had to stop it.  Symptoms resolved off riociguat.    She was admitted in 8/18 with hypotension and AKI.  Valsartan and HCTZ stopped.     She presents today for regular follow up. No changes at last visit, as lasix had just been increased. She is doing well overall. Had a cold and shingles, but that was a couple of months ago. Her SOB is at baseline. She can walk short distances, and around the house, but prefers not to walk long distances. She denies SOB with her ADLs. No lightheadedness or dizziness. No CP, orthopnea, or PND. Denies peripheral edema. BPs mostly run in 120-130s at home. Higher here. Taking all medication today.   6 minute walk (7/17): 238 m 6 minute walk (10/17): 171 m 6 minute walk (3/18): 231 m 6 minute walk (11/18): 123 m 6  minute walk (1/19): 213 m 6 minute walk (10/19): 231 m  Labs (5/17): K 4.7 => 4, creatinine 0.96 => 1.04 Labs (8/17): K 4.1, creatinine 1.5 Labs (9/17): K 3.5, creatinine 1.13 Labs (10/17): K 3.6, creatinine 1.0 Labs (1/18): K 3.9, creatinine 1.14, BNP 34 Labs (2/18): K 4.1, creatinine 1.07, BNP 25 Labs (3/18): K 3.8, creatinine 1.06, BNP 25 Labs (8/18): K 4.6, creatinine 1.38, hgb 9.1 Labs (9/18): BNP 8.7 Labs (11/18): BNP 36, K 3.6, creatinine 0.65  PMH: 1. H/o CVA 2. DM 3. HTN 4. Hyperlipidemia 5. OSA: Cannot tolerate CPAP, has tried multiple masks.  6. COPD: She is on home oxygen. 7. SVT 8. Chronic diastolic CHF with RV failure: Echo (5/17) with EF 55%, severely dilated RV with moderately decreased systolic function, D-shaped interventricular septum.  - Echo (7/18): EF 60-65%, D-shaped interventricular septum, mild RV dilation with normal systolic function, PASP 41, IVC normal, mild MR, mild AS.  9. 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  V/Q scan 5/17 with no evidence for chronic PE.  ANA weakly positive (1:80), ANCA negative, RF negative.  RHC (5/17) with mean RA 20, PA 110/45 mean 66, mean PCWP 20, CI 1.7, PVR 15 WU.  - Intractable side effects with selexipag.  - Did not tolerate Tyvaso - Did not tolerate riociguat.  10. CKD  Social History   Socioeconomic History  .  Marital status: Widowed    Spouse name: Not on file  . Number of children: 2  . Years of education: Not on file  . Highest education level: Not on file  Occupational History  . Occupation: retired    Fish farm manager: OTHER    Comment: [employer]  Social Needs  . Financial resource strain: Not on file  . Food insecurity:    Worry: Not on file    Inability: Not on file  . Transportation needs:    Medical: Not on file    Non-medical: Not on file  Tobacco Use  . Smoking status: Former Smoker     Packs/day: 1.50    Years: 40.00    Pack years: 60.00    Types: Cigarettes    Last attempt to quit: 11/16/2004    Years since quitting: 14.0  . Smokeless tobacco: Former Network engineer and Sexual Activity  . Alcohol use: No    Comment: occassional  . Drug use: No  . Sexual activity: Not on file  Lifestyle  . Physical activity:    Days per week: Not on file    Minutes per session: Not on file  . Stress: Not on file  Relationships  . Social connections:    Talks on phone: Not on file    Gets together: Not on file    Attends religious service: Not on file    Active member of club or organization: Not on file    Attends meetings of clubs or organizations: Not on file    Relationship status: Not on file  . Intimate partner violence:    Fear of current or ex partner: Not on file    Emotionally abused: Not on file    Physically abused: Not on file    Forced sexual activity: Not on file  Other Topics Concern  . Not on file  Social History Narrative  . Not on file   Family History  Problem Relation Age of Onset  . Heart disease Mother   . Heart failure Mother   . Heart disease Brother   . Heart disease Brother   . Heart disease Sister   . Heart disease Sister   . Heart failure Father    Review of systems complete and found to be negative unless listed in HPI.    Current Outpatient Medications  Medication Sig Dispense Refill  . albuterol (PROAIR HFA) 108 (90 Base) MCG/ACT inhaler 2 puffs every 4 hours as needed only  if your can't catch your breath 1 Inhaler 2  . albuterol (PROVENTIL) (2.5 MG/3ML) 0.083% nebulizer solution Take 2.5 mg by nebulization every 6 (six) hours as needed for wheezing or shortness of breath.    . allopurinol (ZYLOPRIM) 300 MG tablet Take 300 mg by mouth daily.    . ASPIRIN ADULT LOW STRENGTH 81 MG EC tablet TAKE 1 TABLET BY MOUTH EVERY DAY 120 tablet 2  . atorvastatin (LIPITOR) 80 MG tablet TAKE 1 TABLET BY MOUTH EVERY DAY 90 tablet 0  . b complex  vitamins tablet Take 1 tablet by mouth daily.    . Cholecalciferol (VITAMIN D3) 1000 units CAPS Take 1,000 Units by mouth daily.    Marland Kitchen co-enzyme Q-10 30 MG capsule Take 30 mg by mouth daily.    . Fluticasone-Umeclidin-Vilant (TRELEGY ELLIPTA) 100-62.5-25 MCG/INH AEPB Inhale 1 puff into the lungs daily. 60 each 5  . furosemide (LASIX) 80 MG tablet Take 1 tablet (80 mg total) by mouth 2 (two) times daily.  180 tablet 2  . KLOR-CON M20 20 MEQ tablet TAKE 2 TABLETS BY MOUTH EVERY DAY 180 tablet 1  . loratadine (CLARITIN) 10 MG tablet Take 10 mg by mouth daily.    . macitentan (OPSUMIT) 10 MG tablet Take 1 tablet (10 mg total) by mouth daily. 30 tablet 11  . metFORMIN (GLUCOPHAGE) 500 MG tablet Take 500 mg by mouth 2 (two) times daily with a meal.    . Omega 3 1000 MG CAPS Take 1 capsule by mouth daily.    Marland Kitchen omeprazole (PRILOSEC) 40 MG capsule Take 40 mg by mouth daily.    . OXYGEN Inhale 2 L into the lungs continuous.    . sildenafil (REVATIO) 20 MG tablet TAKE 2 TABLETS BY MOUTH 3 TIMES DAILY. 540 tablet 1   No current facility-administered medications for this encounter.    Vitals:   12/07/18 0844  BP: (!) 180/86  Pulse: (!) 112  SpO2: 98%  Weight: 57.4 kg (126 lb 9.6 oz)     Wt Readings from Last 3 Encounters:  12/07/18 57.4 kg (126 lb 9.6 oz)  10/12/18 59.1 kg (130 lb 3.2 oz)  09/06/18 59.6 kg (131 lb 6.4 oz)   General: Well appearing. No resp difficulty. HEENT: Normal Neck: Supple. JVP 5-6. Carotids 2+ bilat; no bruits. No thyromegaly or nodule noted. Cor: PMI nondisplaced. Regular, slightly tachy, No M/G/R noted Lungs: CTAB, normal effort. Abdomen: Soft, non-tender, non-distended, no HSM. No bruits or masses. +BS  Extremities: No cyanosis, clubbing, or rash. R and LLE no edema.  Neuro: Alert & orientedx3, cranial nerves grossly intact. moves all 4 extremities w/o difficulty. Affect pleasant   Assessment/Plan: 1. Pulmonary hypertension: Severe pulmonary hypertension, suspect  mixed group 3 (COPD, untreated OSA) and group 1 PH (out of proportion to COPD). She was very short of breath with exertion and had very elevated right-sided filling pressures on 5/17 RHC.  PA pressure was systemic. Cardiac output low, but not moving towards IV Flolan given the mixed etiology of her PH. V/Q scan not suggestive of chronic PEs and autoimmune serologies sent (RF negative, ANCA negative, ANA only weakly positive (1:80).She has been unable to tolerate selexipag, Tyvaso, or riociguat. Echo in 7/18 appeared to show some improvement in RV function.  - Echo 11/19 showed normal RV function, mild AS, EF 60-65%, unable to estimate PA pressure - Continue sildenafil 40 mg TID - Continue Opsumit 10 mg daily.  - Has refused 6MW.  2. COPD: On home oxygen.  Prior long-time smoker. No change.   3. Chronic diastolic CHF with prominent RV failure:  - Echo 09/2018: EF 60-65%, RV normal - NYHA III symptoms at baseline in setting of PAH as above. - Volume status stable on exam.   - Continue lasix 80 mg BID.  4. SVT:  - Denies palpitations.  5. OSA: Has been unable to tolerate CPAP.  She uses oxygen at night.  OSA may have small contribution to [facility] but does not explain the extent of PAH.  6. Hyperlipidemia - Continue statin.  - No changes based on Lipids 11/07/2018.  7. HTN - BP elevated in 170-180s on arrival.  - Will start amlodipine 5 mg daily. Discussed with Dr. Aundra Dubin.  - Check BP at home and if running > 588 systolic, will have her call back.   Labs today. Meds as above. RTC 2-3 months. Sooner as needed.   Shirley Friar, PA-C  12/07/2018   Greater than 50% of the 25 minute visit was  spent in counseling/coordination of care regarding disease state education, salt/fluid restriction, sliding scale diuretics, and medication compliance.

## 2018-12-07 ENCOUNTER — Other Ambulatory Visit: Payer: Self-pay

## 2018-12-07 ENCOUNTER — Ambulatory Visit (HOSPITAL_COMMUNITY)
Admission: RE | Admit: 2018-12-07 | Discharge: 2018-12-07 | Disposition: A | Discharge status: Home / Self Care | Payer: Medicare HMO | Source: Ambulatory Visit | Attending: Internal Medicine | Admitting: Internal Medicine

## 2018-12-07 ENCOUNTER — Encounter (HOSPITAL_COMMUNITY): Payer: Self-pay

## 2018-12-07 VITALS — BP 164/70 | HR 112 | Wt 126.6 lb

## 2018-12-07 DIAGNOSIS — N189 Chronic kidney disease, unspecified: Secondary | ICD-10-CM | POA: Insufficient documentation

## 2018-12-07 DIAGNOSIS — Z9981 Dependence on supplemental oxygen: Secondary | ICD-10-CM | POA: Diagnosis not present

## 2018-12-07 DIAGNOSIS — I4510 Unspecified right bundle-branch block: Secondary | ICD-10-CM | POA: Insufficient documentation

## 2018-12-07 DIAGNOSIS — Z7984 Long term (current) use of oral hypoglycemic drugs: Secondary | ICD-10-CM | POA: Diagnosis not present

## 2018-12-07 DIAGNOSIS — I5032 Chronic diastolic (congestive) heart failure: Secondary | ICD-10-CM | POA: Diagnosis not present

## 2018-12-07 DIAGNOSIS — I471 Supraventricular tachycardia: Secondary | ICD-10-CM | POA: Insufficient documentation

## 2018-12-07 DIAGNOSIS — R0600 Dyspnea, unspecified: Secondary | ICD-10-CM

## 2018-12-07 DIAGNOSIS — E785 Hyperlipidemia, unspecified: Secondary | ICD-10-CM | POA: Diagnosis not present

## 2018-12-07 DIAGNOSIS — Z87891 Personal history of nicotine dependence: Secondary | ICD-10-CM | POA: Insufficient documentation

## 2018-12-07 DIAGNOSIS — I50810 Right heart failure, unspecified: Secondary | ICD-10-CM

## 2018-12-07 DIAGNOSIS — Z8673 Personal history of transient ischemic attack (TIA), and cerebral infarction without residual deficits: Secondary | ICD-10-CM | POA: Insufficient documentation

## 2018-12-07 DIAGNOSIS — Z79899 Other long term (current) drug therapy: Secondary | ICD-10-CM | POA: Diagnosis not present

## 2018-12-07 DIAGNOSIS — Z8249 Family history of ischemic heart disease and other diseases of the circulatory system: Secondary | ICD-10-CM | POA: Insufficient documentation

## 2018-12-07 DIAGNOSIS — I130 Hypertensive heart and chronic kidney disease with heart failure and stage 1 through stage 4 chronic kidney disease, or unspecified chronic kidney disease: Secondary | ICD-10-CM | POA: Diagnosis not present

## 2018-12-07 DIAGNOSIS — I10 Essential (primary) hypertension: Secondary | ICD-10-CM | POA: Diagnosis not present

## 2018-12-07 DIAGNOSIS — R9431 Abnormal electrocardiogram [ECG] [EKG]: Secondary | ICD-10-CM | POA: Insufficient documentation

## 2018-12-07 DIAGNOSIS — E1122 Type 2 diabetes mellitus with diabetic chronic kidney disease: Secondary | ICD-10-CM | POA: Insufficient documentation

## 2018-12-07 DIAGNOSIS — I272 Pulmonary hypertension, unspecified: Secondary | ICD-10-CM | POA: Insufficient documentation

## 2018-12-07 DIAGNOSIS — J449 Chronic obstructive pulmonary disease, unspecified: Secondary | ICD-10-CM | POA: Insufficient documentation

## 2018-12-07 DIAGNOSIS — G4733 Obstructive sleep apnea (adult) (pediatric): Secondary | ICD-10-CM | POA: Diagnosis not present

## 2018-12-07 LAB — CBC
HCT: 35.9 % — ABNORMAL LOW (ref 36.0–46.0)
Hemoglobin: 11.3 g/dL — ABNORMAL LOW (ref 12.0–15.0)
MCH: 30.4 pg (ref 26.0–34.0)
MCHC: 31.5 g/dL (ref 30.0–36.0)
MCV: 96.5 fL (ref 80.0–100.0)
Platelets: 185 10*3/uL (ref 150–400)
RBC: 3.72 MIL/uL — ABNORMAL LOW (ref 3.87–5.11)
RDW: 16 % — ABNORMAL HIGH (ref 11.5–15.5)
WBC: 5.6 10*3/uL (ref 4.0–10.5)
nRBC: 0 % (ref 0.0–0.2)

## 2018-12-07 LAB — BASIC METABOLIC PANEL
Anion gap: 11 (ref 5–15)
BUN: 41 mg/dL — ABNORMAL HIGH (ref 8–23)
CO2: 32 mmol/L (ref 22–32)
Calcium: 9.3 mg/dL (ref 8.9–10.3)
Chloride: 97 mmol/L — ABNORMAL LOW (ref 98–111)
Creatinine, Ser: 1.2 mg/dL — ABNORMAL HIGH (ref 0.44–1.00)
GFR calc Af Amer: 50 mL/min — ABNORMAL LOW (ref >60)
GFR, EST NON AFRICAN AMERICAN: 44 mL/min — AB (ref >60)
Glucose, Bld: 128 mg/dL — ABNORMAL HIGH (ref 70–99)
Potassium: 3.6 mmol/L (ref 3.5–5.1)
Sodium: 140 mmol/L (ref 135–145)

## 2018-12-07 MED ORDER — AMLODIPINE BESYLATE 5 MG PO TABS: 5.0000 mg | ORAL_TABLET | Freq: Every day | ORAL | 1 refills | Status: DC

## 2018-12-07 NOTE — Patient Instructions (Signed)
START Amlodipine 62m (1 tab) at bedtime  Check your blood pressure every morning. Call our office if top number is above 150.   Labs today We will only contact you if something comes back abnormal or we need to make some changes. Otherwise no news is good news!  Your physician recommends that you schedule a follow-up appointment in: 2 months with Dr. MAundra Dubin

## 2018-12-29 ENCOUNTER — Ambulatory Visit: Payer: Medicare HMO | Admitting: Internal Medicine

## 2019-01-02 ENCOUNTER — Encounter: Payer: Self-pay | Admitting: Internal Medicine

## 2019-01-02 ENCOUNTER — Ambulatory Visit (INDEPENDENT_AMBULATORY_CARE_PROVIDER_SITE_OTHER): Payer: Medicare HMO | Admitting: Internal Medicine

## 2019-01-02 VITALS — BP 152/70 | HR 98 | Ht 62.0 in | Wt 124.8 lb

## 2019-01-02 DIAGNOSIS — J449 Chronic obstructive pulmonary disease, unspecified: Secondary | ICD-10-CM

## 2019-01-02 DIAGNOSIS — J9611 Chronic respiratory failure with hypoxia: Secondary | ICD-10-CM | POA: Diagnosis not present

## 2019-01-02 DIAGNOSIS — J9612 Chronic respiratory failure with hypercapnia: Secondary | ICD-10-CM

## 2019-01-02 MED ORDER — FLUTICASONE-UMECLIDIN-VILANT 100-62.5-25 MCG/INH IN AEPB: 1.0000 | INHALATION_SPRAY | Freq: Every day | RESPIRATORY_TRACT | 0 refills | Status: DC

## 2019-01-02 NOTE — Progress Notes (Signed)
Subjective:    Patient ID: Beth [name], female    DOB: 17-Oct-1941    MRN: [id number]    Brief patient profile:  53 yobf quit smoking around 2006-7 when noted onset of doe then gradually worse since quit with no gain in wt and referred 05/31/2012 for copd evaluation by Dr Karlton Lemon with GOLD III COPD by PFT's 06/2012    History of Present Illness  05/31/2012 1st pulmonary eval cc progressive doe x 5years ? Some worse in spring and ? Summer with hot humid weather with subjective wheeze while on spiriva x 2 years then advair 7/13 and prednisone has helped to point where need neb less "only  3 x day" and puffer 3-4 times as well. Assoc with voice change/ hoarsenss, ear pain.   No unusual cough, purulent sputum or sinus/hb symptoms on present rx. rec Work on inhaler technique:   Only use your albuterol as a rescue medication (proaire is Plan B,  Nebulizer is C) Add pepcid 20 mg one every night at bedtime GERD diet office visit in 4 weeks, sooner if needed with PFTs> GOLD III criteria > symb 160 2bid prn saba        Admit date: 06/22/2017 Discharge date: 06/24/2017  Brief/Interim Summary: For complete details see H&P but in brief. [name] is a 78 year old female with medical history of hypertension, hyperlipidemia, diabetes mellitus, COPD on 2 L oxygen at home, asthma, GERD, diastolic CHF, pulmonary hypertension, chronic kidney disease stage III who presented with confusion and slurred speech. Patient daughter reported that patient became confused and noted mild slurred speech but no unilateral weakness. Upon ED evaluation patient was already alert and oriented 3 with no weakness or slurred speech. She was found to be hypotensive with blood pressure of 79/42 which improved after IV fluid bolus. CT scan of the head did not show any acute abnormalities and she was placed on observation for further evaluation. MRI of the head was done with negative results. On blood work up her creatinine was  found to be slight worsening and this was treated with IV fluids. Patient clinically improved, was elevated by physical therapy who recommended PT/OT at home. Blood pressure has remained stable and no further neurologic deficit has been noted.  Subjective: Patient seen and examined on the day of discharge she reports doing much better than when she was admitted. Has no complaint. Her breathing is at baseline. Denies any weakness, dizziness, chest pain, palpitations and blurry vision. Patient remains afebrile, tolerating diet well. Kidney function has improved  Discharge Diagnoses/Hospital Course:  Hypotension Multifactorial, patient on multiple blood pressure lowering agents, diuretics and medications for pulmonary hypertension. She was treated with IV fluids which improved blood pressure. During hospital stay, home medications were held, and blood pressure went above goal with IV fluids. Macitentan and Riociguat were resume and blood pressure up on discharge was more stable. Valsartan and HCTZ were held and will continue to be on hold until patient is seen by cardiology or primary care physician.  Acute metabolic encephalopathy Hypotension and dehydration may have contributed to her AMS, although would not explain slurred speech. Workup so far reassuring as MRI and CT of the head not show any acute abnormality. Patient is back to baseline, she is alert and oriented 3 and her speech is clear. Will defer to PCP if further workup is necessary.  Acute renal failure on chronic kidney disease Baseline creatinine is 1.0, pulmonary admission serum creatinine was 2.0. Highly secondary to  dehydration and continuation of ARB is, diuretics and NSAIDs. Patient also hypotensive ATN maybe a component. Serum creatinine back to baseline, improving with IV fluids. Patient was encouraged to follow-up with primary care physician. And encouraged to keep good oral hydration.   COPD - stable No changes in  medication were made during hospital stay  Chronic diastolic heart failure Patient was dry during hospital stay. She was given IV fluid therefore will resume Lasix upon discharge. Follow-up with cardiology  Hypertension Patient was admitted due to hypotension Valsartan and HCTZ were placed on hold, and will continue to be on hold until patient is evaluated by cardiology or primary care physician. Patient on medications for pulmonary hypertension which have fairly significant effect on lowering BP.   Pulmonary hypertension Continue Macitentan and Riociguat  All other chronic medical condition were stable during the hospitalization.  Patient seen by PT recommending [facility]/OT  On the day of the discharge the patient's vitals were stable, and no other acute medical condition were reported by patient. Patient was felt safe to be discharge to home      07/16/2017  f/u ov/Ashlynn Gunnels re: COPD/ chronic resp failure/ cor pulmonale on symbicort but unable to use effectively  Chief Complaint  Patient presents with  . Pulmonary Consult    SOB with exertion referred by Dr. Willey Blade  gradually downhill since  may of 2017 with worse breathing no cough increase 02= 2lpm   Doe = MMRC4  = sob if tries to leave home or while getting dressed  rec Plan A = Automatic = Trelegy one click each am - call if any problem acquiring  Plan B = Backup Only use your albuterol as a rescue medication  Plan C = Crisis - only use your albuterol nebulizer if you first try Plan B and it fails to help      12/27/2017  f/u ov/Yashira Offenberger re:  GOLD III/ 02  19/24 2lpm  With cor pulmonale  Chief Complaint  Patient presents with  . Follow-up    Breathing has improved. She states she has not had to use her albuterol inhaler or neb.   Dyspnea:   MMRC3 = can't walk 100 yards even at a slow pace at a flat grade s stopping due to sob on 2lpm  Cough: no Sleep: ok on 2lpm one pillows  rec Plan A = Automatic = Trelegy one  click each am    Plan B = Backup Only use your albuterol as a rescue medication Plan C = Crisis - only use your albuterol nebulizer if you first try Plan B and it fails to help > ok to use the nebulizer up to every 4 hours but if start needing it regularly call for immediate appointment Please schedule a follow up visit in 6  months but call sooner if needed - PLEASE BRING ALL INHALERS/SOLUTIONS WITH YOU ON RETURN     06/28/2018  f/u ov/Dimitrious Micciche re:  GOLD III/ 02 dep  Chief Complaint  Patient presents with  . Follow-up    Breathing is overall doing well. She is using her albuterol inhaler about once per wk and rarely uses neb.   Dyspnea:  foodlion  MMRC3 = can't walk 100 yards even at a slow pace at a flat grade s stopping due to sob  On 2lpm Cough: no Sleeping: one pillow and 2lpm  SABA use: rare 02: does not vary = 2lpm  rec No  Change in medications  02 is 2lpm at  bedtime and ok to adjust during the day to keep your 02 saturations above 90% (no need for 02 at all if over 90% without it) Please schedule a follow up visit in 6 months but call sooner if needed  with all medications /inhalers/ solutions in hand so we can verify exactly what you are taking. This includes all medications from all doctors and over the counters    01/02/2019  f/u ov/Lucielle Vokes re:  GOLD III/ 02 dep 2lpm does not adjust/ no meds  Chief Complaint  Patient presents with  . Follow-up    6 month follow up = patient stated her breathing has been good. Has not needed to use her rescue medications often.   Dyspnea:  Can still do food lion but nothing bigger on 2lpm slow pace = MMRC3 = can't walk 100 yards even at a slow pace at a flat grade s stopping due to sob   Cough: none/ but hoarse  Sleeping: one pillow / bed is flat SABA use: rarely use hfa/ much less neb saba    No obvious day to day or daytime variability or assoc excess/ purulent sputum or mucus plugs or hemoptysis or cp or chest tightness, subjective wheeze  or overt sinus or hb symptoms.   Sleeping as above  without nocturnal  or early am exacerbation  of respiratory  c/o's or need for noct saba. Also denies any obvious fluctuation of symptoms with weather or environmental changes or other aggravating or alleviating factors except as outlined above   No unusual exposure hx or h/o childhood pna/ asthma or knowledge of premature birth.  Current Allergies, Complete Past Medical History, Past Surgical History, Family History, and Social History were reviewed in [employer] record.  ROS  The following are not active complaints unless bolded Hoarseness, sore throat, dysphagia, dental problems, itching, sneezing,  nasal congestion or discharge of excess mucus or purulent secretions, ear ache,   fever, chills, sweats, unintended wt loss or wt gain, classically pleuritic or exertional cp,  orthopnea pnd or arm/hand swelling  or leg swelling, presyncope, palpitations, abdominal pain, anorexia, nausea, vomiting, diarrhea  or change in bowel habits or change in bladder habits, change in stools or change in urine, dysuria, hematuria,  rash, arthralgias, visual complaints, headache, numbness, weakness or ataxia or problems with walking or coordination,  change in mood or  memory.        Current Meds  Medication Sig  . albuterol (PROAIR HFA) 108 (90 Base) MCG/ACT inhaler 2 puffs every 4 hours as needed only  if your can't catch your breath  . albuterol (PROVENTIL) (2.5 MG/3ML) 0.083% nebulizer solution Take 2.5 mg by nebulization every 6 (six) hours as needed for wheezing or shortness of breath.  . allopurinol (ZYLOPRIM) 300 MG tablet Take 300 mg by mouth daily.  Marland Kitchen amLODipine (NORVASC) 5 MG tablet Take 1 tablet (5 mg total) by mouth at bedtime.  . ASPIRIN ADULT LOW STRENGTH 81 MG EC tablet TAKE 1 TABLET BY MOUTH EVERY DAY  . atorvastatin (LIPITOR) 80 MG tablet TAKE 1 TABLET BY MOUTH EVERY DAY  . b complex vitamins tablet Take 1 tablet by  mouth daily.  . Cholecalciferol (VITAMIN D3) 1000 units CAPS Take 1,000 Units by mouth daily.  Marland Kitchen co-enzyme Q-10 30 MG capsule Take 30 mg by mouth daily.  . Fluticasone-Umeclidin-Vilant (TRELEGY ELLIPTA) 100-62.5-25 MCG/INH AEPB Inhale 1 puff into the lungs daily.  . furosemide (LASIX) 80 MG tablet Take 1 tablet (80 mg  total) by mouth 2 (two) times daily.  Marland Kitchen KLOR-CON M20 20 MEQ tablet TAKE 2 TABLETS BY MOUTH EVERY DAY  . loratadine (CLARITIN) 10 MG tablet Take 10 mg by mouth daily.  . macitentan (OPSUMIT) 10 MG tablet Take 1 tablet (10 mg total) by mouth daily.  . meloxicam (MOBIC) 15 MG tablet Take 1 tablet by mouth as needed.  . metFORMIN (GLUCOPHAGE) 500 MG tablet Take 500 mg by mouth 2 (two) times daily with a meal.  . Omega 3 1000 MG CAPS Take 1 capsule by mouth daily.  Marland Kitchen omeprazole (PRILOSEC) 40 MG capsule Take 40 mg by mouth daily.  . OXYGEN Inhale 2 L into the lungs continuous.  . sildenafil (REVATIO) 20 MG tablet TAKE 2 TABLETS BY MOUTH 3 TIMES DAILY.                 Objective:   Physical Exam  amb bf nad  01/02/2019        124  06/28/2018       128  12/27/2017       125   07/16/17 131 lb 12.8 oz (59.8 kg)  06/07/17 137 lb 4 oz (62.3 kg)  04/13/17 143 lb 8 oz (65.1 kg)   Vital signs reviewed - Note on arrival 02 sats  96% on 2lpm        HEENT: Top Denture/ nl   oropharynx. Nl external ear canals without cough reflex -  Mild bilateral non-specific turbinate edema     NECK :  without JVD/Nodes/TM/ nl carotid upstrokes bilaterally   LUNGS: no acc muscle use,  Mild barrel  contour chest wall with bilateral  Distant bs s audible wheeze and  without cough on insp or exp maneuver and mild  Hyperresonant  to  percussion bilaterally     CV:  RRR  no s3 or murmur or increase in P2, and no edema   ABD:  soft and nontender with pos late  insp Hoover's  in the supine position. No bruits or organomegaly appreciated, bowel sounds nl  MS:   Nl gait/  ext warm without  deformities, calf tenderness, cyanosis or clubbing No obvious joint restrictions   SKIN: warm and dry without lesions    NEURO:  alert, approp, nl sensorium with  no motor or cerebellar deficits apparent.                        Assessment & Plan:

## 2019-01-02 NOTE — Patient Instructions (Signed)
Remember to hold the device with right hand and open with left and use arm and hammer and brush teeth after using first thing in am   Adjust your 02 if needed with activity to be sure you are above 90%    Please schedule a follow up visit in 6 months but call sooner if needed

## 2019-01-03 ENCOUNTER — Encounter: Payer: Self-pay | Admitting: Internal Medicine

## 2019-01-03 NOTE — Assessment & Plan Note (Signed)
HC03  06/23/17 = 31  But as high as 35 one month prior HCO3  12/14/17 = 29  - 06/28/2018  Walked 2lpm  x 3 laps @ 185 ft each stopped due to  End of study, slow pace, no  desat  And mild sob - HC03  12/07/18 = 32  - 01/02/2019   Walked 2lpm   2 laps @  approx 252f each @ slow pace  stopped due to sob s desats     Well compensated on 2lpm with cor pulmoale so goal is to keep sats > 90% at all times/ reviewed   I had an extended discussion with the patient  reviewing all relevant studies completed to date and  lasting 15 to 20 minutes of a 25 minute visit  which included directly observing ambulatory 02 saturation study documented in a/p section of  today's  office note.  See device teaching which extended face to face time for this visit   Each maintenance medication was reviewed in detail including most importantly the difference between maintenance and prns and under what circumstances the prns are to be triggered using an action plan format that is not reflected in the computer generated alphabetically organized AVS.     Please see AVS for specific instructions unique to this visit that I personally wrote and verbalized to the the pt in detail and then reviewed with pt  by my nurse highlighting any changes in therapy recommended at today's visit .

## 2019-01-03 NOTE — Assessment & Plan Note (Signed)
Quit smoking around 2006-7    - PFT's 06/30/2012 FEV1  0.93 (49%) ratio 49 and DLCO 47 corrects to 87   - 07/16/2017   > trial of trelegy  - Spirometry 07/16/2017  FEV1 0.57 (38%)  Ratio 54 with atypical f/v with no rx prior  - 01/02/2019  After extensive coaching inhaler device,  effectiveness =    90% with elipta vs 75% baseline    Group D in terms of symptom/risk and laba/lama/ICS  therefore appropriate rx at this point > continue trelegy   Advised:  formulary restrictions will be an ongoing challenge for the forseable future and I would be happy to pick an alternative if the pt will first  provide me a list of them -  pt  will need to return here for training for any new device that is required eg dpi vs hfa vs respimat.    In the meantime we can always provide samples so that the patient never runs out of any needed respiratory medications.

## 2019-02-01 ENCOUNTER — Telehealth (HOSPITAL_COMMUNITY): Payer: Self-pay

## 2019-02-01 NOTE — Telephone Encounter (Signed)
Called patient to reschedule appointment currently on 3/25 d/t current state of emergency with Corona virus.  Pt reports feeling well with no exposure to anyone with a fever or cough.  No recent travel.  Pt denies cardiac symptoms currently.  Pt amendable to rescheduling appt.  Pt encouraged to call office if any concerns at home ie change in status, refills etc. Appointment made for June and appointment card mailed. Verbalized understanding.

## 2019-02-08 ENCOUNTER — Encounter (HOSPITAL_COMMUNITY): Payer: Medicare HMO | Admitting: Cardiology

## 2019-02-19 ENCOUNTER — Other Ambulatory Visit (HOSPITAL_COMMUNITY): Payer: Self-pay | Admitting: Cardiology

## 2019-02-27 ENCOUNTER — Other Ambulatory Visit (HOSPITAL_COMMUNITY): Payer: Self-pay | Admitting: Cardiology

## 2019-03-07 ENCOUNTER — Other Ambulatory Visit: Payer: Self-pay | Admitting: Internal Medicine

## 2019-04-21 ENCOUNTER — Telehealth (HOSPITAL_COMMUNITY): Payer: Self-pay

## 2019-04-21 ENCOUNTER — Ambulatory Visit (HOSPITAL_COMMUNITY)
Admission: RE | Admit: 2019-04-21 | Discharge: 2019-04-21 | Disposition: A | Discharge status: Home / Self Care | Payer: Medicare HMO | Source: Ambulatory Visit | Attending: Cardiology | Admitting: Cardiology

## 2019-04-21 ENCOUNTER — Other Ambulatory Visit: Payer: Self-pay

## 2019-04-21 DIAGNOSIS — I272 Pulmonary hypertension, unspecified: Secondary | ICD-10-CM | POA: Diagnosis not present

## 2019-04-21 DIAGNOSIS — I5032 Chronic diastolic (congestive) heart failure: Secondary | ICD-10-CM

## 2019-04-21 MED ORDER — SILDENAFIL CITRATE 20 MG PO TABS: 60.0000 mg | ORAL_TABLET | Freq: Three times a day (TID) | ORAL | 2 refills | Status: DC

## 2019-04-21 NOTE — Telephone Encounter (Signed)
Reviewed AVS with pt:  1. Arrange for BMET/BNP to be drawn. /scheduled 15 June 2. Increase sildenafil to 60 mg tid /confirmed & script sent 3. Followup in 3 months./scheduled 9 Sep

## 2019-04-22 NOTE — Progress Notes (Signed)
Heart Failure TeleHealth Note  Due to national recommendations of social distancing due to [place] 19, Audio/video telehealth visit is felt to be most appropriate for this patient at this time.  See MyChart message from today for patient consent regarding telehealth for [facility].  Date:  04/22/2019   ID:  [name], DOB 05-11-1941, MRN [id number]  Location: Home  Provider location: [facility] Type of Visit: Established patient  PCP:  Willey Blade, MD  Cardiologist:  Dr. Aundra Dubin  Chief Complaint: Shortness of breath   History of Present Illness: Beth [name] is a 78 y.o. female who presents via audio/video conferencing for a telehealth visit today.     she denies symptoms worrisome for COVID 19.   Patient has a history of COPD on home oxygen, untreated OSA, chronic diastolic CHF/RV failure, and pulmonary hypertension.  She was admitted in 5/17 with acute on chronic diastolic CHF with prominent RV failure and marked shortness of breath.  She was diuresed with IV Lasix.  Echo showed dilated and dysfunctional RV with pulmonary hypertension.  RHC showed marked pulmonary hypertension with systemic PA pressure.  She was started on Revatio in the hospital and this was titrated up.  Subsequently, she was started on macitentan.  She next started Selexipag but she had intractable side effects with selexipag so stopped it.  I then tried her on Tyvaso, but she has developed abdominal discomfort and diarrhea with Tyvaso and has had to stop it (symptomatic even at lowest dose). She did not feel like it helped her breathing when she was on it.  She stopped sildenafil and started riociguat. However, she developed painful hand swelling on riociguat and has had to stop it.  Symptoms resolved off riociguat.    She was admitted in 8/18 with hypotension and AKI.  Valsartan and HCTZ stopped.     Echo in 11/19 showed EF 60-65%, mild AS, normal RV size and systolic  function.   She feels like she is doing well.  No dyspnea walking on flat ground unless she "rushes."  Stable weight.  No chest pain.  No orthopnea/PND.  No lightheadedness or syncope.   6 minute walk (7/17): 238 m 6 minute walk (10/17): 171 m 6 minute walk (3/18): 231 m 6 minute walk (11/18): 123 m 6 minute walk (1/19): 213 m 6 minute walk (10/19): 231 m  Labs (5/17): K 4.7 => 4, creatinine 0.96 => 1.04 Labs (8/17): K 4.1, creatinine 1.5 Labs (9/17): K 3.5, creatinine 1.13 Labs (10/17): K 3.6, creatinine 1.0 Labs (1/18): K 3.9, creatinine 1.14, BNP 34 Labs (2/18): K 4.1, creatinine 1.07, BNP 25 Labs (3/18): K 3.8, creatinine 1.06, BNP 25 Labs (8/18): K 4.6, creatinine 1.38, hgb 9.1 Labs (9/18): BNP 8.7 Labs (11/18): BNP 36, K 3.6, creatinine 0.65  PMH: 1. H/o CVA 2. DM 3. HTN 4. Hyperlipidemia 5. OSA: Cannot tolerate CPAP, has tried multiple masks.  6. COPD: She is on home oxygen. 7. SVT 8. Chronic diastolic CHF with RV failure: Echo (5/17) with EF 55%, severely dilated RV with moderately decreased systolic function, D-shaped interventricular septum.  - Echo (7/18): EF 60-65%, D-shaped interventricular septum, mild RV dilation with normal systolic function, PASP 41, IVC normal, mild MR, mild AS.  - Echo (11/19): EF 60-65%, mild AS, normal RV size and systolic function.  9. 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  V/Q scan 5/17 with no evidence for chronic PE.  ANA weakly positive (1:80), ANCA negative, RF negative.  RHC (5/17) with mean RA 20, PA 110/45 mean 66, mean PCWP 20, CI 1.7, PVR 15 WU.  - Intractable side effects with selexipag.  - Did not tolerate Tyvaso - Did not tolerate riociguat.  10. CKD 11. Aortic stenosis: Mild.   Current Outpatient Medications  Medication Sig Dispense Refill  . albuterol (PROVENTIL) (2.5 MG/3ML) 0.083% nebulizer solution Take 2.5 mg by  nebulization every 6 (six) hours as needed for wheezing or shortness of breath.    Marland Kitchen albuterol (VENTOLIN HFA) 108 (90 Base) MCG/ACT inhaler INHALE 2 PUFFS INTO THE LUNGS EVERY 4 HOURS AS NEEDED ONLY IF YOU CANT CATCH YOUR BREATH 18 Inhaler 2  . allopurinol (ZYLOPRIM) 300 MG tablet Take 300 mg by mouth daily.    Marland Kitchen amLODipine (NORVASC) 5 MG tablet Take 1 tablet (5 mg total) by mouth at bedtime. 180 tablet 1  . ASPIRIN ADULT LOW STRENGTH 81 MG EC tablet TAKE 1 TABLET BY MOUTH EVERY DAY 120 tablet 2  . atorvastatin (LIPITOR) 80 MG tablet TAKE 1 TABLET BY MOUTH EVERY DAY 90 tablet 0  . b complex vitamins tablet Take 1 tablet by mouth daily.    . Cholecalciferol (VITAMIN D3) 1000 units CAPS Take 1,000 Units by mouth daily.    Marland Kitchen co-enzyme Q-10 30 MG capsule Take 30 mg by mouth daily.    . Fluticasone-Umeclidin-Vilant (TRELEGY ELLIPTA) 100-62.5-25 MCG/INH AEPB Inhale 1 puff into the lungs daily. 14 each 0  . furosemide (LASIX) 80 MG tablet Take 1 tablet (80 mg total) by mouth 2 (two) times daily. 180 tablet 2  . KLOR-CON M20 20 MEQ tablet TAKE 2 TABLETS BY MOUTH EVERY DAY 180 tablet 1  . loratadine (CLARITIN) 10 MG tablet Take 10 mg by mouth daily.    . macitentan (OPSUMIT) 10 MG tablet Take 1 tablet (10 mg total) by mouth daily. 30 tablet 11  . meloxicam (MOBIC) 15 MG tablet Take 1 tablet by mouth as needed.    . metFORMIN (GLUCOPHAGE) 500 MG tablet Take 500 mg by mouth 2 (two) times daily with a meal.    . Omega 3 1000 MG CAPS Take 1 capsule by mouth daily.    Marland Kitchen omeprazole (PRILOSEC) 40 MG capsule Take 40 mg by mouth daily.    . OXYGEN Inhale 2 L into the lungs continuous.    . sildenafil (REVATIO) 20 MG tablet Take 3 tablets (60 mg total) by mouth 3 (three) times daily. 270 tablet 2   No current facility-administered medications for this encounter.     Allergies:   Aldomet [methyldopa] and Tyvaso [treprostinil]   Social History:  The patient  reports that she quit smoking about 14 years ago. Her  smoking use included cigarettes. She has a 60.00 pack-year smoking history. She has quit using smokeless tobacco. She reports that she does not drink alcohol or use drugs.   Family History:  The patient's family history includes Heart disease in her brother, brother, mother, sister, and sister; Heart failure in her father and mother.   ROS:  Please see the history of present illness.   All other systems are personally reviewed and negative.   Exam:  (Video/Tele Health Call; Exam is subjective and or/visual.) BP 136/70, HR 82, weight 127 lbs General:  Speaks in full sentences. No resp difficulty. Lungs: Normal respiratory effort with conversation.  Abdomen: Non-distended per patient report Extremities: Pt denies  edema. Neuro: Alert & oriented x 3.   Recent Labs: 09/06/2018: B Natriuretic Peptide 13.5 11/07/2018: ALT 19 12/07/2018: BUN 41; Creatinine, Ser 1.20; Hemoglobin 11.3; Platelets 185; Potassium 3.6; Sodium 140  Personally reviewed   Wt Readings from Last 3 Encounters:  01/02/19 56.6 kg (124 lb 12.8 oz)  12/07/18 57.4 kg (126 lb 9.6 oz)  10/12/18 59.1 kg (130 lb 3.2 oz)      ASSESSMENT AND PLAN:  1. 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 She was very short of breath with exertion and had very elevated right-sided filling pressures on 5/17 RHC.  PA pressure was systemic. Cardiac output low, but not moving towards IV Flolan given the mixed etiology of her PH. V/Q scan not suggestive of chronic PEs and autoimmune serologies sent (RF negative, ANCA negative, ANA only weakly positive (1:80).She has been unable to tolerate selexipag, Tyvaso, or riociguat. Echo in 7/18 appeared to show some improvement in RV function, and echo in 11/19 showed normal RV size and systolic function.   - Increase sildenafil to 60 mg tid.  - Continue Opsumit 10 mg daily.  - 6 minute walk next appt.  2.  COPD: On home oxygen.  Prior long-time smoker. No change.   3. Chronic diastolic CHF with prominent RV failure: Echo 09/2018 with RV function improved, near normal. NYHA class II symptoms. Weight stable.  - Continue lasix 80 mg BID. I will arrange for BMET/BNP.  4. SVT: Denies palpitations.  5. OSA: Has been unable to tolerate CPAP.  She uses oxygen at night.  OSA may have small contribution to [facility] but does not explain the extent of PAH.  6. Hyperlipidemia - Continue statin.  7. HTN: BP controlled on amlodipine.    COVID screen The patient does not have any symptoms that suggest any further testing/ screening at this time.  Social distancing reinforced today.  Patient Risk: After full review of this patients clinical status, I feel that they are at moderate risk for cardiac decompensation at this time.  Relevant cardiac medications were reviewed at length with the patient today. The patient does not have concerns regarding their medications at this time.   Recommended follow-up:  3 months  Today, I have spent 18 minutes with the patient with telehealth technology discussing the above issues .    Signed, Loralie Champagne, MD  04/22/2019  [place] [address] [facility] and [place] [address] [phone] (office) [phone] (fax)

## 2019-05-01 ENCOUNTER — Other Ambulatory Visit: Payer: Self-pay

## 2019-05-01 ENCOUNTER — Ambulatory Visit (HOSPITAL_COMMUNITY)
Admission: RE | Admit: 2019-05-01 | Discharge: 2019-05-01 | Disposition: A | Discharge status: Home / Self Care | Payer: Medicare HMO | Source: Ambulatory Visit | Attending: Internal Medicine | Admitting: Internal Medicine

## 2019-05-01 DIAGNOSIS — I5032 Chronic diastolic (congestive) heart failure: Secondary | ICD-10-CM | POA: Diagnosis not present

## 2019-05-01 LAB — BASIC METABOLIC PANEL
Anion gap: 11 (ref 5–15)
BUN: 32 mg/dL — ABNORMAL HIGH (ref 8–23)
CO2: 27 mmol/L (ref 22–32)
Calcium: 9.4 mg/dL (ref 8.9–10.3)
Chloride: 100 mmol/L (ref 98–111)
Creatinine, Ser: 1.17 mg/dL — ABNORMAL HIGH (ref 0.44–1.00)
GFR calc Af Amer: 52 mL/min — ABNORMAL LOW (ref >60)
GFR calc non Af Amer: 45 mL/min — ABNORMAL LOW (ref >60)
Glucose, Bld: 135 mg/dL — ABNORMAL HIGH (ref 70–99)
Potassium: 3.6 mmol/L (ref 3.5–5.1)
Sodium: 138 mmol/L (ref 135–145)

## 2019-05-01 LAB — BRAIN NATRIURETIC PEPTIDE: B Natriuretic Peptide: 21.6 pg/mL (ref 0.0–100.0)

## 2019-05-24 ENCOUNTER — Other Ambulatory Visit (HOSPITAL_COMMUNITY): Payer: Self-pay | Admitting: Cardiology

## 2019-05-30 ENCOUNTER — Other Ambulatory Visit (HOSPITAL_COMMUNITY): Payer: Self-pay | Admitting: Cardiology

## 2019-05-30 DIAGNOSIS — I509 Heart failure, unspecified: Secondary | ICD-10-CM

## 2019-06-01 ENCOUNTER — Other Ambulatory Visit (HOSPITAL_COMMUNITY): Payer: Self-pay | Admitting: Cardiology

## 2019-07-03 ENCOUNTER — Ambulatory Visit: Payer: Medicare HMO | Admitting: Internal Medicine

## 2019-07-10 ENCOUNTER — Other Ambulatory Visit (HOSPITAL_COMMUNITY): Payer: Self-pay | Admitting: Internal Medicine

## 2019-07-10 DIAGNOSIS — I509 Heart failure, unspecified: Secondary | ICD-10-CM

## 2019-07-12 ENCOUNTER — Other Ambulatory Visit: Payer: Self-pay

## 2019-07-12 ENCOUNTER — Encounter: Payer: Self-pay | Admitting: Internal Medicine

## 2019-07-12 ENCOUNTER — Ambulatory Visit (INDEPENDENT_AMBULATORY_CARE_PROVIDER_SITE_OTHER): Payer: Medicare HMO | Admitting: Internal Medicine

## 2019-07-12 DIAGNOSIS — J449 Chronic obstructive pulmonary disease, unspecified: Secondary | ICD-10-CM | POA: Diagnosis not present

## 2019-07-12 DIAGNOSIS — J9612 Chronic respiratory failure with hypercapnia: Secondary | ICD-10-CM | POA: Diagnosis not present

## 2019-07-12 DIAGNOSIS — J9611 Chronic respiratory failure with hypoxia: Secondary | ICD-10-CM | POA: Diagnosis not present

## 2019-07-12 DIAGNOSIS — I272 Pulmonary hypertension, unspecified: Secondary | ICD-10-CM

## 2019-07-12 NOTE — Patient Instructions (Signed)
No change in medications    Please schedule a follow up visit in 6  months but call sooner if needed

## 2019-07-12 NOTE — Progress Notes (Signed)
Subjective:    Patient ID: Beth [name], female    DOB: [birth year]    MRN: [id number]    Brief patient profile:  8 yobf quit smoking around 2006-7 when noted onset of doe then gradually worse since quit with no gain in wt and referred 05/31/2012 for copd evaluation by Dr Karlton Lemon with GOLD III COPD by PFT's 06/2012    History of Present Illness  05/31/2012 1st pulmonary eval cc progressive doe x 5years ? Some worse in spring and ? Summer with hot humid weather with subjective wheeze while on spiriva x 2 years then advair 7/13 and prednisone has helped to point where need neb less "only  3 x day" and puffer 3-4 times as well. Assoc with voice change/ hoarsenss, ear pain.   No unusual cough, purulent sputum or sinus/hb symptoms on present rx. rec Work on inhaler technique:   Only use your albuterol as a rescue medication (proaire is Plan B,  Nebulizer is C) Add pepcid 20 mg one every night at bedtime GERD diet office visit in 4 weeks, sooner if needed with PFTs> GOLD III criteria > symb 160 2bid prn saba        Admit date: 06/22/2017 Discharge date: 06/24/2017  Brief/Interim Summary: For complete details see H&P but in brief. Beth [name] is a 78 year old female with medical history of hypertension, hyperlipidemia, diabetes mellitus, COPD on 2 L oxygen at home, asthma, GERD, diastolic CHF, pulmonary hypertension, chronic kidney disease stage III who presented with confusion and slurred speech. Patient daughter reported that patient became confused and noted mild slurred speech but no unilateral weakness. Upon ED evaluation patient was already alert and oriented 3 with no weakness or slurred speech. She was found to be hypotensive with blood pressure of 79/42 which improved after IV fluid bolus. CT scan of the head did not show any acute abnormalities and she was placed on observation for further evaluation. MRI of the head was done with negative results. On blood work up her creatinine was  found to be slight worsening and this was treated with IV fluids. Patient clinically improved, was elevated by physical therapy who recommended PT/OT at home. Blood pressure has remained stable and no further neurologic deficit has been noted.  Subjective: Patient seen and examined on the day of discharge she reports doing much better than when she was admitted. Has no complaint. Her breathing is at baseline. Denies any weakness, dizziness, chest pain, palpitations and blurry vision. Patient remains afebrile, tolerating diet well. Kidney function has improved  Discharge Diagnoses/Hospital Course:  Hypotension Multifactorial, patient on multiple blood pressure lowering agents, diuretics and medications for pulmonary hypertension. She was treated with IV fluids which improved blood pressure. During hospital stay, home medications were held, and blood pressure went above goal with IV fluids. Macitentan and Riociguat were resume and blood pressure up on discharge was more stable. Valsartan and HCTZ were held and will continue to be on hold until patient is seen by cardiology or primary care physician.  Acute metabolic encephalopathy Hypotension and dehydration may have contributed to her AMS, although would not explain slurred speech. Workup so far reassuring as MRI and CT of the head not show any acute abnormality. Patient is back to baseline, she is alert and oriented 3 and her speech is clear. Will defer to PCP if further workup is necessary.  Acute renal failure on chronic kidney disease Baseline creatinine is 1.0, pulmonary admission serum creatinine was 2.0. Highly secondary to  dehydration and continuation of ARB is, diuretics and NSAIDs. Patient also hypotensive ATN maybe a component. Serum creatinine back to baseline, improving with IV fluids. Patient was encouraged to follow-up with primary care physician. And encouraged to keep good oral hydration.   COPD - stable No changes in  medication were made during hospital stay  Chronic diastolic heart failure Patient was dry during hospital stay. She was given IV fluid therefore will resume Lasix upon discharge. Follow-up with cardiology  Hypertension Patient was admitted due to hypotension Valsartan and HCTZ were placed on hold, and will continue to be on hold until patient is evaluated by cardiology or primary care physician. Patient on medications for pulmonary hypertension which have fairly significant effect on lowering BP.   Pulmonary hypertension Continue Macitentan and Riociguat  All other chronic medical condition were stable during the hospitalization.  Patient seen by PT recommending [facility]/OT  On the day of the discharge the patient's vitals were stable, and no other acute medical condition were reported by patient. Patient was felt safe to be discharge to home      07/16/2017  f/u ov/ re: COPD/ chronic resp failure/ cor pulmonale on symbicort but unable to use effectively  Chief Complaint  Patient presents with  . Pulmonary Consult    SOB with exertion referred by Dr. Willey Blade  gradually downhill since  may of 2017 with worse breathing no cough increase 02= 2lpm   Doe = MMRC4  = sob if tries to leave home or while getting dressed  rec Plan A = Automatic = Trelegy one click each am - call if any problem acquiring  Plan B = Backup Only use your albuterol as a rescue medication  Plan C = Crisis - only use your albuterol nebulizer if you first try Plan B and it fails to help      12/27/2017  f/u ov/ re:  GOLD III/ 02  19/24 2lpm  With cor pulmonale  Chief Complaint  Patient presents with  . Follow-up    Breathing has improved. She states she has not had to use her albuterol inhaler or neb.   Dyspnea:   MMRC3 = can't walk 100 yards even at a slow pace at a flat grade s stopping due to sob on 2lpm  Cough: no Sleep: ok on 2lpm one pillows  rec Plan A = Automatic = Trelegy one  click each am    Plan B = Backup Only use your albuterol as a rescue medication Plan C = Crisis - only use your albuterol nebulizer if you first try Plan B and it fails to help > ok to use the nebulizer up to every 4 hours but if start needing it regularly call for immediate appointment Please schedule a follow up visit in 6  months but call sooner if needed - PLEASE BRING ALL INHALERS/SOLUTIONS WITH YOU ON RETURN     06/28/2018  f/u ov/ re:  GOLD III/ 02 dep  Chief Complaint  Patient presents with  . Follow-up    Breathing is overall doing well. She is using her albuterol inhaler about once per wk and rarely uses neb.   Dyspnea:  foodlion  MMRC3 = can't walk 100 yards even at a slow pace at a flat grade s stopping due to sob  On 2lpm Cough: no Sleeping: one pillow and 2lpm  SABA use: rare 02: does not vary = 2lpm  rec No  Change in medications  02 is 2lpm at  bedtime and ok to adjust during the day to keep your 02 saturations above 90% (no need for 02 at all if over 90% without it) Please schedule a follow up visit in 6 months but call sooner if needed  with all medications /inhalers/ solutions in hand so we can verify exactly what you are taking. This includes all medications from all doctors and over the counters    01/02/2019  f/u ov/ re:  GOLD III/ 02 dep 2lpm does not adjust/ no meds  Chief Complaint  Patient presents with  . Follow-up    6 month follow up = patient stated her breathing has been good. Has not needed to use her rescue medications often.  Dyspnea:  Can still do food lion but nothing bigger on 2lpm slow pace = MMRC3 = can't walk 100 yards even at a slow pace at a flat grade s stopping due to sob   Cough: none/ but hoarse  Sleeping: one pillow / bed is flat SABA use: rarely use hfa/ much less neb saba rec Remember to hold the device with right hand and open with left and use arm and hammer and brush teeth after using first thing in am  Adjust your 02 if  needed with activity to be sure you are above 90%    07/12/2019  f/u ov/ re:  Copd GOLD III/ 02 dep hs and walking / maint on trelegy  Chief Complaint  Patient presents with  . Follow-up    Breathing is overall doing well. She rarely uses her albuterol inhaler.   Dyspnea:  Still doing food lion on 2lpm =  MMRC2 = can't walk a nl pace on a flat grade s sob but does fine slow and flat eg Cough: none/ some pnds/sneezing better now  Sleeping: ok flat one or two pillows SABA use: rare hfa/ never neb  02:  2lpm 24/7    No obvious day to day or daytime variability or assoc excess/ purulent sputum or mucus plugs or hemoptysis or cp or chest tightness, subjective wheeze or overt sinus or hb symptoms.   Sleeping as above without nocturnal  or early am exacerbation  of respiratory  c/o's or need for noct saba. Also denies any obvious fluctuation of symptoms with weather or environmental changes or other aggravating or alleviating factors except as outlined above   No unusual exposure hx or h/o childhood pna/ asthma or knowledge of premature birth.  Current Allergies, Complete Past Medical History, Past Surgical History, Family History, and Social History were reviewed in [employer] record.  ROS  The following are not active complaints unless bolded Hoarseness, sore throat, dysphagia, dental problems, itching, sneezing,  nasal congestion or discharge of excess mucus or purulent secretions, ear ache,   fever, chills, sweats, unintended wt loss or wt gain, classically pleuritic or exertional cp,  orthopnea pnd or arm/hand swelling  or leg swelling, presyncope, palpitations, abdominal pain, anorexia, nausea, vomiting, diarrhea  or change in bowel habits or change in bladder habits, change in stools or change in urine, dysuria, hematuria,  rash, arthralgias, visual complaints, headache, numbness, weakness or ataxia or problems with walking or coordination,  change in mood or   memory.        Current Meds  Medication Sig  . albuterol (PROVENTIL) (2.5 MG/3ML) 0.083% nebulizer solution Take 2.5 mg by nebulization every 6 (six) hours as needed for wheezing or shortness of breath.  Marland Kitchen albuterol (VENTOLIN HFA) 108 (90 Base) MCG/ACT inhaler  INHALE 2 PUFFS INTO THE LUNGS EVERY 4 HOURS AS NEEDED ONLY IF YOU CANT CATCH YOUR BREATH  . allopurinol (ZYLOPRIM) 300 MG tablet Take 300 mg by mouth daily.  . ASPIRIN ADULT LOW STRENGTH 81 MG EC tablet TAKE 1 TABLET BY MOUTH EVERY DAY  . atorvastatin (LIPITOR) 80 MG tablet TAKE 1 TABLET BY MOUTH EVERY DAY  . b complex vitamins tablet Take 1 tablet by mouth daily.  . Cholecalciferol (VITAMIN D3) 1000 units CAPS Take 1,000 Units by mouth daily.  Marland Kitchen co-enzyme Q-10 30 MG capsule Take 30 mg by mouth daily.  . Fluticasone-Umeclidin-Vilant (TRELEGY ELLIPTA) 100-62.5-25 MCG/INH AEPB Inhale 1 puff into the lungs daily.  . furosemide (LASIX) 80 MG tablet TAKE 1 TABLET (80 MG TOTAL) BY MOUTH 2 (TWO) TIMES DAILY.  Marland Kitchen KLOR-CON M20 20 MEQ tablet TAKE 2 TABLETS BY MOUTH EVERY DAY  . loratadine (CLARITIN) 10 MG tablet Take 10 mg by mouth daily.  . macitentan (OPSUMIT) 10 MG tablet Take 1 tablet (10 mg total) by mouth daily.  . meloxicam (MOBIC) 15 MG tablet Take 1 tablet by mouth as needed.  . metFORMIN (GLUCOPHAGE) 500 MG tablet Take 500 mg by mouth 2 (two) times daily with a meal.  . Omega 3 1000 MG CAPS Take 1 capsule by mouth daily.  Marland Kitchen omeprazole (PRILOSEC) 40 MG capsule Take 40 mg by mouth daily.  . OXYGEN Inhale 2 L into the lungs continuous.  . sildenafil (REVATIO) 20 MG tablet TAKE 2 TABLETS BY MOUTH 3 TIMES A DAY                   Objective:   Physical Exam   07/12/2019        127  01/02/2019        124  06/28/2018       128  12/27/2017       125   07/16/17 131 lb 12.8 oz (59.8 kg)  06/07/17 137 lb 4 oz (62.3 kg)  04/13/17 143 lb 8 oz (65.1 kg)   Vital signs reviewed - Note on arrival 02 sats  99% on 2lpm  And 96% RA at rest       HEENT: Top dentures/ nl  oropharynx. Nl external ear canals without cough reflex   NECK :  without JVD/Nodes/TM/ nl carotid upstrokes bilaterally   LUNGS: no acc muscle use,  Mild barrel  contour chest wall with bilateral  Distant bs s audible wheeze and  without cough on insp or exp maneuvers  and mild  Hyperresonant  to  percussion bilaterally     CV:  RRR  no s3 or murmur or increase in P2, and no edema   ABD:  soft and nontender with pos end  insp Hoover's  in the supine position. No bruits or organomegaly appreciated, bowel sounds nl  MS:   Nl gait/  ext warm without deformities, calf tenderness, cyanosis or clubbing No obvious joint restrictions   SKIN: warm and dry without lesions    NEURO:  alert, approp, nl sensorium with  no motor or cerebellar deficits apparent.              Assessment & Plan:

## 2019-07-14 ENCOUNTER — Encounter: Payer: Self-pay | Admitting: Internal Medicine

## 2019-07-14 NOTE — Assessment & Plan Note (Addendum)
See [facility]  03/23/16 with PVR = 15 but pcwp 20  Echo 06/07/17 - Normal LV size with EF 60-65%. Mildly dilated RV with normal   systolic function. D-shaped intraventricular septum suggesting a   degree of RV pressure/volume overload. Mild mitral regurgitation.   Mild aortic stenosis (though not fully interrogated). Mild   pulmonary hypertension. IVC not dilated. Echo 09/20/18 Mild AS. Normal RV function. Unable to estimate PAS  C/w WHO III rx per Dr Aundra Dubin, well compensated > no changes rec

## 2019-07-14 NOTE — Assessment & Plan Note (Signed)
HC03  06/23/17 = 31  But as high as 35 one month prior HCO3  12/14/17 = 29  - 06/28/2018  Walked 2lpm  x 3 laps @ 185 ft each stopped due to  End of study, slow pace, no  desat  And mild sob - HC03  12/07/18 = 32  - 01/02/2019   Walked 2lpm   2 laps @  approx 271f each @ slow pace  stopped due to sob s desats    - HCO3   04/21/19  = 27 c/w resolving hypercarbia - RA sats 96% 07/12/2019 / desats with minimal activity    Ok to leave off amb 02 at rest when she gets where she's going so she doesn't run out before she travels back home/ reviewed.   I had an extended discussion with the patient  reviewing all relevant studies completed to date and  lasting 15 to 20 minutes of a 25 minute visit  which included directly observing ambulatory 02 saturation study documented  Above  Each maintenance medication was reviewed in detail including most importantly the difference between maintenance and prns and under what circumstances the prns are to be triggered using an action plan format that is not reflected in the computer generated alphabetically organized AVS.    I performed device teaching  using a teach back technique which also  extended face to face time for this visit (see above)    Please see AVS for specific instructions unique to this visit that I personally wrote and verbalized to the the pt in detail and then reviewed with pt  by my nurse highlighting any changes in therapy recommended at today's visit .

## 2019-07-14 NOTE — Assessment & Plan Note (Signed)
Quit smoking around 2006-7    - PFT's 06/30/2012 FEV1  0.93 (49%) ratio 49 and DLCO 47 corrects to 87   - 07/16/2017   > trial of trelegy  - Spirometry 07/16/2017  FEV1 0.57 (38%)  Ratio 54 with atypical f/v with no rx prior  - 01/02/2019  After extensive coaching inhaler device,  effectiveness =    90% with elipta vs 75% baseline   - The proper method of use, as well as anticipated side effects, of a elipta  inhaler were discussed and demonstrated to the patient.     Group D in terms of symptom/risk and laba/lama/ICS  therefore appropriate rx at this point >>>  Continue trelegy and prn sab

## 2019-07-26 ENCOUNTER — Encounter (HOSPITAL_COMMUNITY): Payer: Self-pay | Admitting: Cardiology

## 2019-07-26 ENCOUNTER — Telehealth (HOSPITAL_COMMUNITY): Payer: Self-pay

## 2019-07-26 ENCOUNTER — Other Ambulatory Visit: Payer: Self-pay

## 2019-07-26 ENCOUNTER — Ambulatory Visit (HOSPITAL_COMMUNITY)
Admission: RE | Admit: 2019-07-26 | Discharge: 2019-07-26 | Disposition: A | Discharge status: Home / Self Care | Payer: Medicare HMO | Source: Ambulatory Visit | Attending: Cardiology | Admitting: Cardiology

## 2019-07-26 VITALS — BP 146/80 | HR 110 | Wt 129.0 lb

## 2019-07-26 DIAGNOSIS — F17210 Nicotine dependence, cigarettes, uncomplicated: Secondary | ICD-10-CM | POA: Diagnosis not present

## 2019-07-26 DIAGNOSIS — J449 Chronic obstructive pulmonary disease, unspecified: Secondary | ICD-10-CM | POA: Diagnosis not present

## 2019-07-26 DIAGNOSIS — Z79899 Other long term (current) drug therapy: Secondary | ICD-10-CM | POA: Diagnosis not present

## 2019-07-26 DIAGNOSIS — I4510 Unspecified right bundle-branch block: Secondary | ICD-10-CM | POA: Diagnosis not present

## 2019-07-26 DIAGNOSIS — N189 Chronic kidney disease, unspecified: Secondary | ICD-10-CM | POA: Diagnosis not present

## 2019-07-26 DIAGNOSIS — Z8249 Family history of ischemic heart disease and other diseases of the circulatory system: Secondary | ICD-10-CM | POA: Insufficient documentation

## 2019-07-26 DIAGNOSIS — Z8673 Personal history of transient ischemic attack (TIA), and cerebral infarction without residual deficits: Secondary | ICD-10-CM | POA: Insufficient documentation

## 2019-07-26 DIAGNOSIS — Z7984 Long term (current) use of oral hypoglycemic drugs: Secondary | ICD-10-CM | POA: Diagnosis not present

## 2019-07-26 DIAGNOSIS — I350 Nonrheumatic aortic (valve) stenosis: Secondary | ICD-10-CM | POA: Insufficient documentation

## 2019-07-26 DIAGNOSIS — I272 Pulmonary hypertension, unspecified: Secondary | ICD-10-CM

## 2019-07-26 DIAGNOSIS — Z791 Long term (current) use of non-steroidal anti-inflammatories (NSAID): Secondary | ICD-10-CM | POA: Insufficient documentation

## 2019-07-26 DIAGNOSIS — I5032 Chronic diastolic (congestive) heart failure: Secondary | ICD-10-CM | POA: Diagnosis not present

## 2019-07-26 DIAGNOSIS — R0602 Shortness of breath: Secondary | ICD-10-CM | POA: Diagnosis present

## 2019-07-26 DIAGNOSIS — E1122 Type 2 diabetes mellitus with diabetic chronic kidney disease: Secondary | ICD-10-CM | POA: Diagnosis not present

## 2019-07-26 DIAGNOSIS — I130 Hypertensive heart and chronic kidney disease with heart failure and stage 1 through stage 4 chronic kidney disease, or unspecified chronic kidney disease: Secondary | ICD-10-CM | POA: Insufficient documentation

## 2019-07-26 DIAGNOSIS — G4733 Obstructive sleep apnea (adult) (pediatric): Secondary | ICD-10-CM | POA: Diagnosis not present

## 2019-07-26 DIAGNOSIS — E785 Hyperlipidemia, unspecified: Secondary | ICD-10-CM | POA: Diagnosis not present

## 2019-07-26 DIAGNOSIS — I5033 Acute on chronic diastolic (congestive) heart failure: Secondary | ICD-10-CM | POA: Insufficient documentation

## 2019-07-26 DIAGNOSIS — I471 Supraventricular tachycardia: Secondary | ICD-10-CM | POA: Diagnosis not present

## 2019-07-26 DIAGNOSIS — Z9981 Dependence on supplemental oxygen: Secondary | ICD-10-CM | POA: Diagnosis not present

## 2019-07-26 LAB — BASIC METABOLIC PANEL
Anion gap: 13 (ref 5–15)
BUN: 33 mg/dL — ABNORMAL HIGH (ref 8–23)
CO2: 29 mmol/L (ref 22–32)
Calcium: 9.9 mg/dL (ref 8.9–10.3)
Chloride: 96 mmol/L — ABNORMAL LOW (ref 98–111)
Creatinine, Ser: 0.91 mg/dL (ref 0.44–1.00)
GFR calc Af Amer: >60 mL/min (ref >60)
GFR calc non Af Amer: >60 mL/min (ref >60)
Glucose, Bld: 111 mg/dL — ABNORMAL HIGH (ref 70–99)
Potassium: 3.3 mmol/L — ABNORMAL LOW (ref 3.5–5.1)
Sodium: 138 mmol/L (ref 135–145)

## 2019-07-26 LAB — BRAIN NATRIURETIC PEPTIDE: B Natriuretic Peptide: 24.6 pg/mL (ref 0.0–100.0)

## 2019-07-26 MED ORDER — SILDENAFIL CITRATE 20 MG PO TABS: 80.0000 mg | ORAL_TABLET | Freq: Three times a day (TID) | ORAL | 8 refills | Status: DC

## 2019-07-26 MED ORDER — POTASSIUM CHLORIDE CRYS ER 20 MEQ PO TBCR: 60.0000 meq | EXTENDED_RELEASE_TABLET | Freq: Every day | ORAL | 6 refills | Status: DC

## 2019-07-26 MED ORDER — AMLODIPINE BESYLATE 10 MG PO TABS: 10.0000 mg | ORAL_TABLET | Freq: Every day | ORAL | 3 refills | Status: DC

## 2019-07-26 NOTE — Patient Instructions (Signed)
INCREASE Amlodipine to 35m (1 tab ) daily  INCREASE Sildenafil to 856m(4 tabs) three times a day  Labs today We will only contact you if something comes back abnormal or we need to make some changes. Otherwise no news is good news!  Your physician recommends that you schedule a follow-up appointment in: 3 months with an ECHO  Your physician has requested that you have an echocardiogram. Echocardiography is a painless test that uses sound waves to create images of your heart. It provides your doctor with information about the size and shape of your heart and how well your heart's chambers and valves are working. This procedure takes approximately one hour. There are no restrictions for this procedure.  At the [facility] and your health needs are our priority. As part of our continuing mission to provide you with exceptional heart care, we have created designated Provider Care Teams. These Care Teams include your primary Cardiologist (physician) and Advanced Practice Providers (APPs- Physician Assistants and Nurse Practitioners) who all work together to provide you with the care you need, when you need it.   You may see any of the following providers on your designated Care Team at your next follow up: . Marland Kitchenr DaGlori Bickers Dr DaLoralie Champagne AmDarrick GrinderNP   Please be sure to bring in all your medications bottles to every appointment.

## 2019-07-26 NOTE — Telephone Encounter (Signed)
-----  Message from Larey Dresser, MD sent at 07/26/2019  1:39 PM EDT ----- Add an additional 20 mEq KCl to her daily regimen.

## 2019-07-26 NOTE — Progress Notes (Signed)
Date:  07/26/2019   ID:  [name], DOB [birth year], MRN [id number]    Provider location: [place] [facility] Type of Visit: Established patient  PCP:  Willey Blade, MD  Cardiologist:  Dr. Aundra Dubin  Chief Complaint: Shortness of breath   History of Present Illness: Beth [name] is a 78 y.o. female who has a history of COPD on home oxygen, untreated OSA, chronic diastolic CHF/RV failure, and pulmonary hypertension.  She was admitted in 5/17 with acute on chronic diastolic CHF with prominent RV failure and marked shortness of breath.  She was diuresed with IV Lasix.  Echo showed dilated and dysfunctional RV with pulmonary hypertension.  RHC showed marked pulmonary hypertension with systemic PA pressure.  She was started on Revatio in the hospital and this was titrated up.  Subsequently, she was started on macitentan.  She next started Selexipag but she had intractable side effects with selexipag so stopped it.  I then tried her on Tyvaso, but she has developed abdominal discomfort and diarrhea with Tyvaso and has had to stop it (symptomatic even at lowest dose). She did not feel like it helped her breathing when she was on it.  She stopped sildenafil and started riociguat. However, she developed painful hand swelling on riociguat and has had to stop it.  Symptoms resolved off riociguat.    She was admitted in 8/18 with hypotension and AKI.  Valsartan and HCTZ stopped.     Echo in 11/19 showed EF 60-65%, mild AS, normal RV size and systolic function.   She returns for followup of pulmonary hypertension.  She is short of breath if she "rushes" but generally does ok walking on flat ground.  No orthopnea/PND.  No chest pain.  No lightheadedness/falls.  BP is running high.  Despite reporting that she is doing well, her 6 minute walk is significantly less today.  She does say that her knees were hurting.   ECG (personally reviewed): NSR 100, RBBB with PACs.   6 minute  walk (7/17): 238 m 6 minute walk (10/17): 171 m 6 minute walk (3/18): 231 m 6 minute walk (11/18): 123 m 6 minute walk (1/19): 213 m 6 minute walk (10/19): 231 m 6 minute walk (9/20): 122 m  Labs (5/17): K 4.7 => 4, creatinine 0.96 => 1.04 Labs (8/17): K 4.1, creatinine 1.5 Labs (9/17): K 3.5, creatinine 1.13 Labs (10/17): K 3.6, creatinine 1.0 Labs (1/18): K 3.9, creatinine 1.14, BNP 34 Labs (2/18): K 4.1, creatinine 1.07, BNP 25 Labs (3/18): K 3.8, creatinine 1.06, BNP 25 Labs (8/18): K 4.6, creatinine 1.38, hgb 9.1 Labs (9/18): BNP 8.7 Labs (11/18): BNP 36, K 3.6, creatinine 0.65 Labs (6/20): K 3.6, creatinine 1.17, BNP 21.6  PMH: 1. H/o CVA 2. DM 3. HTN 4. Hyperlipidemia 5. OSA: Cannot tolerate CPAP, has tried multiple masks.  6. COPD: She is on home oxygen. 7. SVT 8. Chronic diastolic CHF with RV failure: Echo (5/17) with EF 55%, severely dilated RV with moderately decreased systolic function, D-shaped interventricular septum.  - Echo (7/18): EF 60-65%, D-shaped interventricular septum, mild RV dilation with normal systolic function, PASP 41, IVC normal, mild MR, mild AS.  - Echo (11/19): EF 60-65%, mild AS, normal RV size and systolic function.  9. 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  V/Q scan 5/17 with no evidence for chronic PE.  ANA weakly  positive (1:80), ANCA negative, RF negative.  RHC (5/17) with mean RA 20, PA 110/45 mean 66, mean PCWP 20, CI 1.7, PVR 15 WU.  - Intractable side effects with selexipag.  - Did not tolerate Tyvaso - Did not tolerate riociguat.  10. CKD 11. Aortic stenosis: Mild.   Current Outpatient Medications  Medication Sig Dispense Refill  . albuterol (PROVENTIL) (2.5 MG/3ML) 0.083% nebulizer solution Take 2.5 mg by nebulization every 6 (six) hours as needed for wheezing or shortness of breath.    Marland Kitchen albuterol (VENTOLIN HFA) 108 (90 Base)  MCG/ACT inhaler INHALE 2 PUFFS INTO THE LUNGS EVERY 4 HOURS AS NEEDED ONLY IF YOU CANT CATCH YOUR BREATH 18 Inhaler 2  . allopurinol (ZYLOPRIM) 300 MG tablet Take 300 mg by mouth daily.    Marland Kitchen amLODipine (NORVASC) 10 MG tablet Take 1 tablet (10 mg total) by mouth at bedtime. 90 tablet 3  . ASPIRIN ADULT LOW STRENGTH 81 MG EC tablet TAKE 1 TABLET BY MOUTH EVERY DAY 90 tablet 3  . atorvastatin (LIPITOR) 80 MG tablet TAKE 1 TABLET BY MOUTH EVERY DAY 90 tablet 0  . b complex vitamins tablet Take 1 tablet by mouth daily.    . Cholecalciferol (VITAMIN D3) 1000 units CAPS Take 1,000 Units by mouth daily.    Marland Kitchen co-enzyme Q-10 30 MG capsule Take 30 mg by mouth daily.    . Fluticasone-Umeclidin-Vilant (TRELEGY ELLIPTA) 100-62.5-25 MCG/INH AEPB Inhale 1 puff into the lungs daily. 14 each 0  . furosemide (LASIX) 80 MG tablet TAKE 1 TABLET (80 MG TOTAL) BY MOUTH 2 (TWO) TIMES DAILY. 180 tablet 2  . loratadine (CLARITIN) 10 MG tablet Take 10 mg by mouth daily.    . macitentan (OPSUMIT) 10 MG tablet Take 1 tablet (10 mg total) by mouth daily. 30 tablet 11  . meloxicam (MOBIC) 15 MG tablet Take 1 tablet by mouth as needed.    . metFORMIN (GLUCOPHAGE) 500 MG tablet Take 500 mg by mouth 2 (two) times daily with a meal.    . Omega 3 1000 MG CAPS Take 1 capsule by mouth daily.    Marland Kitchen omeprazole (PRILOSEC) 40 MG capsule Take 40 mg by mouth daily.    . OXYGEN Inhale 2 L into the lungs continuous.    . potassium chloride SA (KLOR-CON M20) 20 MEQ tablet Take 3 tablets (60 mEq total) by mouth daily. 270 tablet 6  . sildenafil (REVATIO) 20 MG tablet Take 4 tablets (80 mg total) by mouth 3 (three) times daily. 360 tablet 8   No current facility-administered medications for this encounter.     Allergies:   Aldomet [methyldopa] and Tyvaso [treprostinil]   Social History:  The patient  reports that she quit smoking about 14 years ago. Her smoking use included cigarettes. She has a 60.00 pack-year smoking history. She has quit  using smokeless tobacco. She reports that she does not drink alcohol or use drugs.   Family History:  The patient's family history includes Heart disease in her brother, brother, mother, sister, and sister; Heart failure in her father and mother.   ROS:  Please see the history of present illness.   All other systems are personally reviewed and negative.   Exam:   BP (!) 146/80   Pulse (!) 110   Wt 58.5 kg (129 lb)   SpO2 96% Comment: on 2L  BMI 22.85 kg/m  General: NAD Neck: JVP 8 cm, no thyromegaly or thyroid nodule.  Lungs: Clear to auscultation bilaterally with  normal respiratory effort. CV: Nondisplaced PMI.  Heart mildly tachycardic, regular S1/S2, no S3/S4, no murmur.  No peripheral edema.  No carotid bruit.  Normal pedal pulses.  Abdomen: Soft, nontender, no hepatosplenomegaly, no distention.  Skin: Intact without lesions or rashes.  Neurologic: Alert and oriented x 3.  Psych: Normal affect. Extremities: No clubbing or cyanosis.  HEENT: Normal.    Recent Labs: 11/07/2018: ALT 19 12/07/2018: Hemoglobin 11.3; Platelets 185 07/26/2019: B Natriuretic Peptide 24.6; BUN 33; Creatinine, Ser 0.91; Potassium 3.3; Sodium 138  Personally reviewed   Wt Readings from Last 3 Encounters:  07/26/19 58.5 kg (129 lb)  07/12/19 57.6 kg (127 lb)  01/02/19 56.6 kg (124 lb 12.8 oz)      ASSESSMENT AND PLAN:  1. 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 She was very short of breath with exertion and had very elevated right-sided filling pressures on 5/17 RHC.  PA pressure was systemic. Cardiac output low, but not moving towards IV Flolan given the mixed etiology of her PH. V/Q scan not suggestive of chronic PEs and autoimmune serologies sent (RF negative, ANCA negative, ANA only weakly positive (1:80).She has been unable to tolerate selexipag, Tyvaso, or riociguat. Echo in 7/18 appeared to  show some improvement in RV function, and echo in 11/19 showed normal RV size and systolic function.  6 minute walk today was less than in the past but she says that her breathing is stable.  I suspect that part of the problem is orthopedic (knee pain).  - Increase sildenafil to 80 mg tid.  - Continue Opsumit 10 mg daily.  - BNP today.  - Echo at followup in 3 months.  2. COPD: On home oxygen.  Prior long-time smoker. No change.   3. Chronic diastolic CHF with prominent RV failure: Echo 09/2018 with RV function improved, near normal. NYHA class II symptoms. Weight stable.  - Continue lasix 80 mg BID. BMET/BNP.   4. SVT: Denies palpitations.  5. OSA: Has been unable to tolerate CPAP.  She uses oxygen at night.  OSA may have small contribution to [facility] but does not explain the extent of PAH.  6. Hyperlipidemia - Continue statin.  7. HTN: Increase amlodipine to 10 mg daily.     Signed, Loralie Champagne, MD  07/26/2019  [facility] [address] [facility] and [place] [address] [phone] (office) [phone] (fax)

## 2019-07-26 NOTE — Telephone Encounter (Signed)
Pt aware of lab results. Aware to start taking 60 meq Potassium daily. Pt currently on 40 meq daily. Pt verbalized understanding.

## 2019-08-07 ENCOUNTER — Other Ambulatory Visit (HOSPITAL_COMMUNITY): Payer: Self-pay | Admitting: *Deleted

## 2019-08-07 MED ORDER — SILDENAFIL CITRATE 20 MG PO TABS: 80.0000 mg | ORAL_TABLET | Freq: Three times a day (TID) | ORAL | 8 refills | Status: DC

## 2019-08-16 ENCOUNTER — Telehealth (HOSPITAL_COMMUNITY): Payer: Self-pay | Admitting: Pharmacy Technician

## 2019-08-16 NOTE — Telephone Encounter (Signed)
Advanced Heart Failure Patient Advocate Encounter  Prior Authorization for Sildenafil 49m ([id number]) has been approved.     Effective dates: 09/29 through 11/16/2019  Patients co-pay is $3.60 for 30 day supply  Called [facility] and the claim went through successfully.  ElCharlann BoxerCPhT

## 2019-08-16 NOTE — Telephone Encounter (Signed)
Patient Advocate Encounter   Received notification from [facility] that prior authorization for Sildenafil 28m (80 tid) is required.   PA submitted on CoverMyMeds Key Key: [id number] Status is pending   Will continue to follow.  ECharlann Boxer CPhT

## 2019-09-05 ENCOUNTER — Other Ambulatory Visit (HOSPITAL_COMMUNITY): Payer: Self-pay | Admitting: Cardiology

## 2019-09-13 ENCOUNTER — Other Ambulatory Visit (HOSPITAL_COMMUNITY): Payer: Self-pay | Admitting: Student

## 2019-10-02 ENCOUNTER — Telehealth: Payer: Self-pay | Admitting: Internal Medicine

## 2019-10-02 MED ORDER — TRELEGY ELLIPTA 100-62.5-25 MCG/INH IN AEPB: 1.0000 | INHALATION_SPRAY | Freq: Every day | RESPIRATORY_TRACT | 3 refills | Status: DC

## 2019-10-02 NOTE — Telephone Encounter (Signed)
Pt requesting refill on Trelegy.  This has been sent to preferred pharmacy.  Nothing further needed at this time- will close encounter.

## 2019-10-13 ENCOUNTER — Other Ambulatory Visit (HOSPITAL_COMMUNITY): Payer: Self-pay | Admitting: Cardiology

## 2019-10-30 ENCOUNTER — Ambulatory Visit (HOSPITAL_BASED_OUTPATIENT_CLINIC_OR_DEPARTMENT_OTHER)
Admission: RE | Admit: 2019-10-30 | Discharge: 2019-10-30 | Disposition: A | Discharge status: Home / Self Care | Payer: Medicare HMO | Source: Ambulatory Visit | Attending: Cardiology | Admitting: Cardiology

## 2019-10-30 ENCOUNTER — Ambulatory Visit (HOSPITAL_COMMUNITY)
Admission: RE | Admit: 2019-10-30 | Discharge: 2019-10-30 | Disposition: A | Discharge status: Home / Self Care | Payer: Medicare HMO | Source: Ambulatory Visit | Attending: Cardiology | Admitting: Cardiology

## 2019-10-30 ENCOUNTER — Other Ambulatory Visit: Payer: Self-pay

## 2019-10-30 ENCOUNTER — Other Ambulatory Visit (HOSPITAL_COMMUNITY): Payer: Self-pay

## 2019-10-30 ENCOUNTER — Encounter (HOSPITAL_COMMUNITY): Payer: Self-pay | Admitting: Cardiology

## 2019-10-30 VITALS — BP 149/71 | HR 104 | Wt 125.8 lb

## 2019-10-30 DIAGNOSIS — I130 Hypertensive heart and chronic kidney disease with heart failure and stage 1 through stage 4 chronic kidney disease, or unspecified chronic kidney disease: Secondary | ICD-10-CM | POA: Insufficient documentation

## 2019-10-30 DIAGNOSIS — I5032 Chronic diastolic (congestive) heart failure: Secondary | ICD-10-CM

## 2019-10-30 DIAGNOSIS — N189 Chronic kidney disease, unspecified: Secondary | ICD-10-CM | POA: Diagnosis not present

## 2019-10-30 DIAGNOSIS — I471 Supraventricular tachycardia: Secondary | ICD-10-CM | POA: Diagnosis not present

## 2019-10-30 DIAGNOSIS — I4510 Unspecified right bundle-branch block: Secondary | ICD-10-CM | POA: Insufficient documentation

## 2019-10-30 DIAGNOSIS — Z87891 Personal history of nicotine dependence: Secondary | ICD-10-CM | POA: Diagnosis not present

## 2019-10-30 DIAGNOSIS — Z79899 Other long term (current) drug therapy: Secondary | ICD-10-CM | POA: Diagnosis not present

## 2019-10-30 DIAGNOSIS — I350 Nonrheumatic aortic (valve) stenosis: Secondary | ICD-10-CM | POA: Insufficient documentation

## 2019-10-30 DIAGNOSIS — E1122 Type 2 diabetes mellitus with diabetic chronic kidney disease: Secondary | ICD-10-CM | POA: Insufficient documentation

## 2019-10-30 DIAGNOSIS — Z8673 Personal history of transient ischemic attack (TIA), and cerebral infarction without residual deficits: Secondary | ICD-10-CM | POA: Insufficient documentation

## 2019-10-30 DIAGNOSIS — I272 Pulmonary hypertension, unspecified: Secondary | ICD-10-CM | POA: Insufficient documentation

## 2019-10-30 DIAGNOSIS — Z7982 Long term (current) use of aspirin: Secondary | ICD-10-CM | POA: Insufficient documentation

## 2019-10-30 DIAGNOSIS — Z9981 Dependence on supplemental oxygen: Secondary | ICD-10-CM | POA: Insufficient documentation

## 2019-10-30 DIAGNOSIS — J449 Chronic obstructive pulmonary disease, unspecified: Secondary | ICD-10-CM | POA: Diagnosis not present

## 2019-10-30 DIAGNOSIS — Z7984 Long term (current) use of oral hypoglycemic drugs: Secondary | ICD-10-CM | POA: Diagnosis not present

## 2019-10-30 DIAGNOSIS — Z8249 Family history of ischemic heart disease and other diseases of the circulatory system: Secondary | ICD-10-CM | POA: Insufficient documentation

## 2019-10-30 DIAGNOSIS — G4733 Obstructive sleep apnea (adult) (pediatric): Secondary | ICD-10-CM | POA: Insufficient documentation

## 2019-10-30 DIAGNOSIS — I491 Atrial premature depolarization: Secondary | ICD-10-CM | POA: Diagnosis not present

## 2019-10-30 DIAGNOSIS — E785 Hyperlipidemia, unspecified: Secondary | ICD-10-CM | POA: Insufficient documentation

## 2019-10-30 DIAGNOSIS — R9431 Abnormal electrocardiogram [ECG] [EKG]: Secondary | ICD-10-CM | POA: Diagnosis not present

## 2019-10-30 DIAGNOSIS — J810 Acute pulmonary edema: Secondary | ICD-10-CM

## 2019-10-30 LAB — BASIC METABOLIC PANEL
Anion gap: 13 (ref 5–15)
BUN: 27 mg/dL — ABNORMAL HIGH (ref 8–23)
CO2: 30 mmol/L (ref 22–32)
Calcium: 9.5 mg/dL (ref 8.9–10.3)
Chloride: 94 mmol/L — ABNORMAL LOW (ref 98–111)
Creatinine, Ser: 1.06 mg/dL — ABNORMAL HIGH (ref 0.44–1.00)
GFR calc Af Amer: 58 mL/min — ABNORMAL LOW (ref >60)
GFR calc non Af Amer: 50 mL/min — ABNORMAL LOW (ref >60)
Glucose, Bld: 107 mg/dL — ABNORMAL HIGH (ref 70–99)
Potassium: 3.6 mmol/L (ref 3.5–5.1)
Sodium: 137 mmol/L (ref 135–145)

## 2019-10-30 LAB — BRAIN NATRIURETIC PEPTIDE: B Natriuretic Peptide: 21 pg/mL (ref 0.0–100.0)

## 2019-10-30 MED ORDER — BISOPROLOL FUMARATE 5 MG PO TABS: 2.5000 mg | ORAL_TABLET | Freq: Every day | ORAL | 5 refills | Status: DC

## 2019-10-30 MED ORDER — OPSUMIT 10 MG PO TABS: 10.0000 mg | ORAL_TABLET | Freq: Every day | ORAL | 11 refills | Status: DC

## 2019-10-30 NOTE — Progress Notes (Signed)
  Echocardiogram 2D Echocardiogram has been performed.  Beth [name] 10/30/2019, 10:02 AM

## 2019-10-30 NOTE — Progress Notes (Signed)
SIX MIN WALK 10/30/2019 07/26/2019 07/12/2019 01/02/2019 06/28/2018 12/27/2017 09/14/2016  Supplimental Oxygen during Test? (L/min) Yes Yes Yes Yes No Yes Yes  O2 Flow Rate _0 - 2 2  Type Continuous - Continuous Continuous - Continuous Continuous  Laps 7 4 - - - - -  Baseline BP (sitting) - - - - - - 140/72  Baseline Heartrate 106 74 - - - - 109  Baseline SPO2 100 98 - - - - 95  Heartrate 100 122 - - - - 125  SPO2 86 98 - - - - 88  Heartrate 101 81 - - - - 111  SPO2 96 97 - - - - 92  Stopped or Paused before Six Minutes Yes Yes - - - - Yes  Other Symptoms at end of Exercise sob fatigue - - - - Multiple short rest breaks of less than one minute for moderate shortness of breath with exertional auditory wheezing  Tech Comments: 213.73mters 400 meters average pace/no SOB//lmr slower than average pace/no SOB/tired//lmr slow pace. c/o mild sob normal pace/SOB//lmr 430 ft

## 2019-10-30 NOTE — Patient Instructions (Addendum)
START Bisoprolol 2.61m ( 1 tab ) daily  Labs today We will only contact you if something comes back abnormal or we need to make some changes. Otherwise no news is good news!  Your physician recommends that you schedule a follow-up appointment in: Tuesday, March 16th, 2021, Garage Code: [id number] with Dr MAundra Dubin  At the [facility] you and your health needs are our priority. As part of our continuing mission to provide you with exceptional heart care, we have created designated Provider Care Teams. These Care Teams include your primary Cardiologist (physician) and Advanced Practice Providers (APPs- Physician Assistants and Nurse Practitioners) who all work together to provide you with the care you need, when you need it.   You may see any of the following providers on your designated Care Team at your next follow up: .Marland KitchenDr DGlori Bickers. Dr DLoralie Champagne. ADarrick Grinder NP . BLyda Jester PA . LAudry Riles PharmD   Please be sure to bring in all your medications bottles to every appointment.

## 2019-10-30 NOTE — Progress Notes (Signed)
Date:  10/30/2019   ID:  Beth [name], DOB May 17, 1941, MRN [id number]    Provider location: [facility] Type of Visit: Established patient  PCP:  Willey Blade, MD  Cardiologist:  Dr. Aundra Dubin  Chief Complaint: Shortness of breath   History of Present Illness: Beth [name] is a 78 y.o. female who has a history of COPD on home oxygen, untreated OSA, chronic diastolic CHF/RV failure, and pulmonary hypertension.  She was admitted in 5/17 with acute on chronic diastolic CHF with prominent RV failure and marked shortness of breath.  She was diuresed with IV Lasix.  Echo showed dilated and dysfunctional RV with pulmonary hypertension.  RHC showed marked pulmonary hypertension with systemic PA pressure.  She was started on Revatio in the hospital and this was titrated up.  Subsequently, she was started on macitentan.  She next started Selexipag but she had intractable side effects with selexipag so stopped it.  I then tried her on Tyvaso, but she has developed abdominal discomfort and diarrhea with Tyvaso and has had to stop it (symptomatic even at lowest dose). She did not feel like it helped her breathing when she was on it.  She stopped sildenafil and started riociguat. However, she developed painful hand swelling on riociguat and has had to stop it.  Symptoms resolved off riociguat.    She was admitted in 8/18 with hypotension and AKI.  Valsartan and HCTZ stopped.     Echo in 11/19 showed EF 60-65%, mild AS, normal RV size and systolic function.    Echo was done today and reviewed, EF 65-70%, LV mid-cavity gradient to 65 mmHg, mild AS with mean gradient 14 mmHg, normal RV size and systolic function, IVC normal, no TR jet.   She returns for followup of pulmonary hypertension.  Weight is stable  She continues to use her home oxygen.  Breathing is about the same, short of breath if she walks fast but no dyspnea if she "takes it easy."  No orthopnea/PND.  No  lightheadedness.  No chest pain.    ECG (personally reviewed): NSR, RBBB, PVC  6 minute walk (7/17): 238 m 6 minute walk (10/17): 171 m 6 minute walk (3/18): 231 m 6 minute walk (11/18): 123 m 6 minute walk (1/19): 213 m 6 minute walk (10/19): 231 m 6 minute walk (9/20): 122 m 6 minute walk (11/20): 213 m  Labs (5/17): K 4.7 => 4, creatinine 0.96 => 1.04 Labs (8/17): K 4.1, creatinine 1.5 Labs (9/17): K 3.5, creatinine 1.13 Labs (10/17): K 3.6, creatinine 1.0 Labs (1/18): K 3.9, creatinine 1.14, BNP 34 Labs (2/18): K 4.1, creatinine 1.07, BNP 25 Labs (3/18): K 3.8, creatinine 1.06, BNP 25 Labs (8/18): K 4.6, creatinine 1.38, hgb 9.1 Labs (9/18): BNP 8.7 Labs (11/18): BNP 36, K 3.6, creatinine 0.65 Labs (6/20): K 3.6, creatinine 1.17, BNP 21.6 Labs (9/20): K 3.3, creatinine 0.91, BNP 25  PMH: 1. H/o CVA 2. DM 3. HTN 4. Hyperlipidemia 5. OSA: Cannot tolerate CPAP, has tried multiple masks.  6. COPD: She is on home oxygen. 7. SVT 8. Chronic diastolic CHF with RV failure: Echo (5/17) with EF 55%, severely dilated RV with moderately decreased systolic function, D-shaped interventricular septum.  - Echo (7/18): EF 60-65%, D-shaped interventricular septum, mild RV dilation with normal systolic function, PASP 41, IVC normal, mild MR, mild AS.  - Echo (11/19): EF 60-65%, mild AS, normal RV size and systolic function.  - Echo (12/20):  EF 65-70%, LV mid-cavity gradient to 65 mmHg, mild AS with mean gradient 14 mmHg, normal RV size and systolic function, IVC normal, no TR jet.  9. 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  V/Q scan 5/17 with no evidence for chronic PE.  ANA weakly positive (1:80), ANCA negative, RF negative.  RHC (5/17) with mean RA 20, PA 110/45 mean 66, mean PCWP 20, CI 1.7, PVR 15 WU.  - Intractable side effects with selexipag.  - Did not tolerate Tyvaso - Did not  tolerate riociguat.  10. CKD 11. Aortic stenosis: Mild.   Current Outpatient Medications  Medication Sig Dispense Refill  . albuterol (PROVENTIL) (2.5 MG/3ML) 0.083% nebulizer solution Take 2.5 mg by nebulization every 6 (six) hours as needed for wheezing or shortness of breath.    Marland Kitchen albuterol (VENTOLIN HFA) 108 (90 Base) MCG/ACT inhaler INHALE 2 PUFFS INTO THE LUNGS EVERY 4 HOURS AS NEEDED ONLY IF YOU CANT CATCH YOUR BREATH 18 Inhaler 2  . allopurinol (ZYLOPRIM) 300 MG tablet Take 300 mg by mouth daily.    Marland Kitchen amLODipine (NORVASC) 10 MG tablet Take 1 tablet (10 mg total) by mouth at bedtime. 90 tablet 3  . ASPIRIN ADULT LOW STRENGTH 81 MG EC tablet TAKE 1 TABLET BY MOUTH EVERY DAY 90 tablet 3  . atorvastatin (LIPITOR) 80 MG tablet TAKE 1 TABLET BY MOUTH EVERY DAY 90 tablet 0  . b complex vitamins tablet Take 1 tablet by mouth daily.    . B Complex-Folic Acid (B COMPLEX-VITAMIN B12 PO) B Complex Vitamin    . Cholecalciferol (VITAMIN D3) 1000 units CAPS Take 1,000 Units by mouth daily.    Marland Kitchen co-enzyme Q-10 30 MG capsule Take 30 mg by mouth daily.    . Fluticasone-Umeclidin-Vilant (TRELEGY ELLIPTA) 100-62.5-25 MCG/INH AEPB Inhale 1 puff into the lungs daily. 180 each 3  . furosemide (LASIX) 80 MG tablet TAKE 1 TABLET (80 MG TOTAL) BY MOUTH 2 (TWO) TIMES DAILY. 180 tablet 2  . loratadine (CLARITIN) 10 MG tablet Take 10 mg by mouth daily.    . meloxicam (MOBIC) 15 MG tablet Take 1 tablet by mouth as needed.    . metFORMIN (GLUCOPHAGE) 500 MG tablet Take 500 mg by mouth 2 (two) times daily with a meal.    . Omega 3 1000 MG CAPS Take 1 capsule by mouth daily.    Marland Kitchen omeprazole (PRILOSEC) 40 MG capsule Take 40 mg by mouth daily.    . OXYGEN Inhale 2 L into the lungs continuous.    . potassium chloride SA (KLOR-CON M20) 20 MEQ tablet Take 3 tablets (60 mEq total) by mouth daily. 270 tablet 6  . sildenafil (REVATIO) 20 MG tablet Take 4 tablets (80 mg total) by mouth 3 (three) times daily. 360 tablet 8  .  bisoprolol (ZEBETA) 5 MG tablet Take 0.5 tablets (2.5 mg total) by mouth daily. 15 tablet 5  . macitentan (OPSUMIT) 10 MG tablet Take 1 tablet (10 mg total) by mouth daily. 30 tablet 11   No current facility-administered medications for this encounter.    Allergies:   Aldomet [methyldopa] and Tyvaso [treprostinil]   Social History:  The patient  reports that she quit smoking about 14 years ago. Her smoking use included cigarettes. She has a 60.00 pack-year smoking history. She has quit using smokeless tobacco. She reports that she does not drink alcohol or use drugs.   Family  History:  The patient's family history includes Heart disease in her brother, brother, mother, sister, and sister; Heart failure in her father and mother.   ROS:  Please see the history of present illness.   All other systems are personally reviewed and negative.   Exam:   BP (!) 149/71   Pulse (!) 104   Wt 57.1 kg (125 lb 12.8 oz)   SpO2 98% Comment: 2L of oxygen  BMI 22.28 kg/m  General: NAD Neck: No JVD, no thyromegaly or thyroid nodule.  Lungs: Mildly distant BS CV: Nondisplaced PMI.  Heart regular S1/S2, no S3/S4, 2/6 early SEM RUSB.  No peripheral edema.  No carotid bruit.  Normal pedal pulses.  Abdomen: Soft, nontender, no hepatosplenomegaly, no distention.  Skin: Intact without lesions or rashes.  Neurologic: Alert and oriented x 3.  Psych: Normal affect. Extremities: No clubbing or cyanosis.  HEENT: Normal.   Recent Labs: 11/07/2018: ALT 19 12/07/2018: Hemoglobin 11.3; Platelets 185 10/30/2019: B Natriuretic Peptide 21.0; BUN 27; Creatinine, Ser 1.06; Potassium 3.6; Sodium 137  Personally reviewed   Wt Readings from Last 3 Encounters:  10/30/19 57.1 kg (125 lb 12.8 oz)  07/26/19 58.5 kg (129 lb)  07/12/19 57.6 kg (127 lb)      ASSESSMENT AND PLAN:  1. Pulmonary hypertension: Severe pulmonary hypertension, suspect mixed group 3 (COPD, untreated OSA) and group 1 PH (out of proportion to  COPD). She was very short of breath with exertion and had very elevated right-sided filling pressures on 5/17 RHC.  PA pressure was systemic. Cardiac output low, but not moving towards IV Flolan given the mixed etiology of her PH. V/Q scan not suggestive of chronic PEs and autoimmune serologies sent (RF negative, ANCA negative, ANA only weakly positive (1:80).She has been unable to tolerate selexipag, Tyvaso, or riociguat. Echo was done today and reviewed, unable to estimate PA systolic pressure but RV appears normal in size and systolic function.  6 minute walk today was improved and subjectively, breathing is stable.    - Continue sildenafil 80 mg tid.  - Continue Opsumit 10 mg daily.  - BNP today.  2. COPD: On home oxygen.  Prior long-time smoker. No change.   3. Chronic diastolic CHF with prominent RV failure: Echo 09/2018 with RV function improved, near normal. NYHA class II symptoms. Weight stable. Of note, echo today showed a mid-cavity gradient with vigorous LV contraction.  - Continue lasix 80 mg BID. BMET/BNP.   - Start bisoprolol 2.5 mg daily (beta-1 selective beta blocker with COPD) with mid-cavity gradient, hopefully will decrease. Can titrate up over time.  4. SVT: Denies palpitations.  5. OSA: Has been unable to tolerate CPAP.  She uses oxygen at night.  OSA may have small contribution to [facility] but does not explain the extent of PAH.  6. Hyperlipidemia - Continue statin.  7. HTN: Adding bisoprolol as above.  8. Aortic stenosis: Mild.   Signed, Loralie Champagne, MD  10/30/2019  [place] [address] [facility] and [place] [address] [phone] (office) [phone] (fax)

## 2019-10-31 ENCOUNTER — Other Ambulatory Visit (HOSPITAL_COMMUNITY): Payer: Self-pay | Admitting: *Deleted

## 2019-11-22 ENCOUNTER — Telehealth (HOSPITAL_COMMUNITY): Payer: Self-pay | Admitting: Pharmacist

## 2019-11-22 NOTE — Telephone Encounter (Signed)
Patient Advocate Encounter   Received notification from [facility] that prior authorization for Opsumit is required.   PA submitted on CoverMyMeds Key [id number] Status is pending     Patient Advocate Encounter   Received notification from [facility] that prior authorization for Sildenafil is required.   PA submitted on CoverMyMeds Key [id number] Status is pending   Will continue to follow.  Audry Riles, PharmD, BCPS, BCCP, CPP [facility] Clinic Pharmacist [phone]

## 2019-11-23 NOTE — Telephone Encounter (Signed)
Advanced Heart Failure Patient Advocate Encounter  Prior Authorization for Sildenafil has been approved.    Effective dates: 11/23/19 through 11/15/20  Audry Riles, PharmD, BCPS, BCCP, CPP [facility] Failure Clinic Pharmacist [phone]

## 2019-11-24 NOTE — Telephone Encounter (Signed)
Advanced Heart Failure Patient Advocate Encounter  Prior Authorization for Opsumit has been approved.    Effective dates: 11/24/19 through 11/15/20  Audry Riles, PharmD, BCPS, BCCP, CPP Heart Failure Clinic Pharmacist [phone]

## 2019-12-16 ENCOUNTER — Telehealth: Payer: Self-pay | Admitting: Physician Assistant

## 2019-12-16 NOTE — Telephone Encounter (Signed)
Pt daughter called stating her mom tested positive for COVID-19 and is asking if we recommend any medication changes. I advised no cardiac medication changes, but to watch for sign/symptoms of CHF exacerbation. I advised pulse ox twice daily and anytime WOB increases or with symptoms. I advised contacting PCP for outpatient protocol. I also advised to not get the vaccine as scheduled next week. She understands the plan and thanked me for the call.

## 2019-12-31 ENCOUNTER — Other Ambulatory Visit (HOSPITAL_COMMUNITY): Payer: Self-pay | Admitting: Cardiology

## 2019-12-31 DIAGNOSIS — I509 Heart failure, unspecified: Secondary | ICD-10-CM

## 2020-01-12 ENCOUNTER — Ambulatory Visit: Payer: Medicare HMO | Admitting: Internal Medicine

## 2020-01-16 ENCOUNTER — Ambulatory Visit: Payer: Medicare HMO | Admitting: Internal Medicine

## 2020-01-30 ENCOUNTER — Ambulatory Visit (HOSPITAL_COMMUNITY)
Admission: RE | Admit: 2020-01-30 | Discharge: 2020-01-30 | Disposition: A | Discharge status: Home / Self Care | Payer: Medicare HMO | Source: Ambulatory Visit | Attending: Cardiology | Admitting: Cardiology

## 2020-01-30 ENCOUNTER — Other Ambulatory Visit: Payer: Self-pay

## 2020-01-30 ENCOUNTER — Encounter (HOSPITAL_COMMUNITY): Payer: Self-pay | Admitting: Cardiology

## 2020-01-30 VITALS — BP 110/50 | HR 97 | Wt 123.6 lb

## 2020-01-30 DIAGNOSIS — I350 Nonrheumatic aortic (valve) stenosis: Secondary | ICD-10-CM | POA: Diagnosis not present

## 2020-01-30 DIAGNOSIS — E785 Hyperlipidemia, unspecified: Secondary | ICD-10-CM | POA: Diagnosis not present

## 2020-01-30 DIAGNOSIS — I272 Pulmonary hypertension, unspecified: Secondary | ICD-10-CM

## 2020-01-30 DIAGNOSIS — I491 Atrial premature depolarization: Secondary | ICD-10-CM | POA: Diagnosis not present

## 2020-01-30 DIAGNOSIS — J449 Chronic obstructive pulmonary disease, unspecified: Secondary | ICD-10-CM | POA: Insufficient documentation

## 2020-01-30 DIAGNOSIS — I4510 Unspecified right bundle-branch block: Secondary | ICD-10-CM | POA: Diagnosis not present

## 2020-01-30 DIAGNOSIS — G4733 Obstructive sleep apnea (adult) (pediatric): Secondary | ICD-10-CM | POA: Insufficient documentation

## 2020-01-30 DIAGNOSIS — Z9981 Dependence on supplemental oxygen: Secondary | ICD-10-CM | POA: Insufficient documentation

## 2020-01-30 DIAGNOSIS — I5032 Chronic diastolic (congestive) heart failure: Secondary | ICD-10-CM | POA: Diagnosis not present

## 2020-01-30 DIAGNOSIS — I10 Essential (primary) hypertension: Secondary | ICD-10-CM | POA: Insufficient documentation

## 2020-01-30 LAB — BASIC METABOLIC PANEL
Anion gap: 13 (ref 5–15)
BUN: 43 mg/dL — ABNORMAL HIGH (ref 8–23)
CO2: 27 mmol/L (ref 22–32)
Calcium: 9.4 mg/dL (ref 8.9–10.3)
Chloride: 99 mmol/L (ref 98–111)
Creatinine, Ser: 1.27 mg/dL — ABNORMAL HIGH (ref 0.44–1.00)
GFR calc Af Amer: 46 mL/min — ABNORMAL LOW (ref >60)
GFR calc non Af Amer: 40 mL/min — ABNORMAL LOW (ref >60)
Glucose, Bld: 161 mg/dL — ABNORMAL HIGH (ref 70–99)
Potassium: 3.6 mmol/L (ref 3.5–5.1)
Sodium: 139 mmol/L (ref 135–145)

## 2020-01-30 LAB — BRAIN NATRIURETIC PEPTIDE: B Natriuretic Peptide: 31 pg/mL (ref 0.0–100.0)

## 2020-01-30 NOTE — Progress Notes (Signed)
Date:  01/30/2020   ID:  [name], DOB 03-05-1941, MRN [id number]    Provider location: [facility] Type of Visit: Established patient  PCP:  Willey Blade, MD  Cardiologist:  Dr. Aundra Dubin  Chief Complaint: Shortness of breath   History of Present Illness: Beth [name] is a 79 y.o. female who has a history of COPD on home oxygen, untreated OSA, chronic diastolic CHF/RV failure, and pulmonary hypertension.  She was admitted in 5/17 with acute on chronic diastolic CHF with prominent RV failure and marked shortness of breath.  She was diuresed with IV Lasix.  Echo showed dilated and dysfunctional RV with pulmonary hypertension.  RHC showed marked pulmonary hypertension with systemic PA pressure.  She was started on Revatio in the hospital and this was titrated up.  Subsequently, she was started on macitentan.  She next started Selexipag but she had intractable side effects with selexipag so stopped it.  I then tried her on Tyvaso, but she has developed abdominal discomfort and diarrhea with Tyvaso and has had to stop it (symptomatic even at lowest dose). She did not feel like it helped her breathing when she was on it.  She stopped sildenafil and started riociguat. However, she developed painful hand swelling on riociguat and has had to stop it.  Symptoms resolved off riociguat.    She was admitted in 8/18 with hypotension and AKI.  Valsartan and HCTZ stopped.     Echo in 11/19 showed EF 60-65%, mild AS, normal RV size and systolic function.    Echo in 12/20 showed EF 65-70%, LV mid-cavity gradient to 65 mmHg, mild AS with mean gradient 14 mmHg, normal RV size and systolic function, IVC normal, no TR jet.   She returns for followup of pulmonary hypertension.  Weight is down 2 lbs.  She continues to wear oxygen at times with exertion and always at night.  She gets short of breath when she rushes.  No dyspnea walking at a slow pace on flat ground.  She can walk  0.5-1 block then has to stop.  No lightheadedness. No orthopnea/PND.  No chest pain.   ECG (personally reviewed): NSR, RBBB, PACs  6 minute walk (7/17): 238 m 6 minute walk (10/17): 171 m 6 minute walk (3/18): 231 m 6 minute walk (11/18): 123 m 6 minute walk (1/19): 213 m 6 minute walk (10/19): 231 m 6 minute walk (9/20): 122 m 6 minute walk (11/20): 213 m 6 minute walk (3/21): 182 m  Labs (5/17): K 4.7 => 4, creatinine 0.96 => 1.04 Labs (8/17): K 4.1, creatinine 1.5 Labs (9/17): K 3.5, creatinine 1.13 Labs (10/17): K 3.6, creatinine 1.0 Labs (1/18): K 3.9, creatinine 1.14, BNP 34 Labs (2/18): K 4.1, creatinine 1.07, BNP 25 Labs (3/18): K 3.8, creatinine 1.06, BNP 25 Labs (8/18): K 4.6, creatinine 1.38, hgb 9.1 Labs (9/18): BNP 8.7 Labs (11/18): BNP 36, K 3.6, creatinine 0.65 Labs (6/20): K 3.6, creatinine 1.17, BNP 21.6 Labs (9/20): K 3.3, creatinine 0.91, BNP 25 Labs (12/20): K 3.6, creatinine 1.06, BNP 21  PMH: 1. H/o CVA 2. DM 3. HTN 4. Hyperlipidemia 5. OSA: Cannot tolerate CPAP, has tried multiple masks.  6. COPD: She is on home oxygen. 7. SVT 8. Chronic diastolic CHF with RV failure: Echo (5/17) with EF 55%, severely dilated RV with moderately decreased systolic function, D-shaped interventricular septum.  - Echo (7/18): EF 60-65%, D-shaped interventricular septum, mild RV dilation with normal systolic  function, PASP 41, IVC normal, mild MR, mild AS.  - Echo (11/19): EF 60-65%, mild AS, normal RV size and systolic function.  - Echo (12/20): EF 65-70%, LV mid-cavity gradient to 65 mmHg, mild AS with mean gradient 14 mmHg, normal RV size and systolic function, IVC normal, no TR jet.  9. 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  V/Q scan 5/17 with no evidence for chronic PE.  ANA weakly positive (1:80), ANCA negative, RF negative.  RHC (5/17) with mean RA 20, PA 110/45  mean 66, mean PCWP 20, CI 1.7, PVR 15 WU.  - Intractable side effects with selexipag.  - Did not tolerate Tyvaso - Did not tolerate riociguat.  10. CKD 11. Aortic stenosis: Mild.   Current Outpatient Medications  Medication Sig Dispense Refill  . albuterol (PROVENTIL) (2.5 MG/3ML) 0.083% nebulizer solution Take 2.5 mg by nebulization every 6 (six) hours as needed for wheezing or shortness of breath.    Marland Kitchen albuterol (VENTOLIN HFA) 108 (90 Base) MCG/ACT inhaler INHALE 2 PUFFS INTO THE LUNGS EVERY 4 HOURS AS NEEDED ONLY IF YOU CANT CATCH YOUR BREATH 18 Inhaler 2  . allopurinol (ZYLOPRIM) 300 MG tablet Take 300 mg by mouth daily.    Marland Kitchen amLODipine (NORVASC) 10 MG tablet Take 1 tablet (10 mg total) by mouth at bedtime. 90 tablet 3  . ASPIRIN ADULT LOW STRENGTH 81 MG EC tablet TAKE 1 TABLET BY MOUTH EVERY DAY 90 tablet 3  . atorvastatin (LIPITOR) 80 MG tablet TAKE 1 TABLET BY MOUTH EVERY DAY 90 tablet 0  . b complex vitamins tablet Take 1 tablet by mouth daily.    . B Complex-Folic Acid (B COMPLEX-VITAMIN B12 PO) B Complex Vitamin    . bisoprolol (ZEBETA) 5 MG tablet Take 0.5 tablets (2.5 mg total) by mouth daily. 15 tablet 5  . Cholecalciferol (VITAMIN D3) 1000 units CAPS Take 1,000 Units by mouth daily.    Marland Kitchen co-enzyme Q-10 30 MG capsule Take 30 mg by mouth daily.    . Fluticasone-Umeclidin-Vilant (TRELEGY ELLIPTA) 100-62.5-25 MCG/INH AEPB Inhale 1 puff into the lungs daily. 180 each 3  . furosemide (LASIX) 80 MG tablet TAKE 1 TABLET (80 MG TOTAL) BY MOUTH 2 (TWO) TIMES DAILY. 180 tablet 2  . loratadine (CLARITIN) 10 MG tablet Take 10 mg by mouth daily.    . macitentan (OPSUMIT) 10 MG tablet Take 1 tablet (10 mg total) by mouth daily. 30 tablet 11  . meloxicam (MOBIC) 15 MG tablet Take 1 tablet by mouth as needed.    . metFORMIN (GLUCOPHAGE) 500 MG tablet Take 500 mg by mouth 2 (two) times daily with a meal.    . Omega 3 1000 MG CAPS Take 1 capsule by mouth daily.    Marland Kitchen omeprazole (PRILOSEC) 40 MG  capsule Take 40 mg by mouth daily.    . OXYGEN Inhale 2 L into the lungs continuous.    . potassium chloride SA (KLOR-CON M20) 20 MEQ tablet Take 3 tablets (60 mEq total) by mouth daily. 270 tablet 6  . sildenafil (REVATIO) 20 MG tablet Take 4 tablets (80 mg total) by mouth 3 (three) times daily. 360 tablet 8  . clobetasol cream (TEMOVATE) 0.05 %     . Zinc Gluconate 100 MG TABS zinc gluconate 100 mg tablet  Take 1 tablet every day by oral route.     No current facility-administered medications for this encounter.  Allergies:   Aldomet [methyldopa] and Tyvaso [treprostinil]   Social History:  The patient  reports that she quit smoking about 15 years ago. Her smoking use included cigarettes. She has a 60.00 pack-year smoking history. She has quit using smokeless tobacco. She reports that she does not drink alcohol or use drugs.   Family History:  The patient's family history includes Heart disease in her brother, brother, mother, sister, and sister; Heart failure in her father and mother.   ROS:  Please see the history of present illness.   All other systems are personally reviewed and negative.   Exam:   BP (!) 110/50   Pulse 97   Wt 56.1 kg (123 lb 9.6 oz)   SpO2 97% Comment: 2L of 02  BMI 21.89 kg/m  General: NAD, thin Neck: JVP 7-8 cm, no thyromegaly or thyroid nodule.  Lungs: Clear to auscultation bilaterally with normal respiratory effort. CV: Nondisplaced PMI.  Heart regular S1/S2, no S3/S4, 1/6 early SEM RUSB.  No peripheral edema.  No carotid bruit.  Normal pedal pulses.  Abdomen: Soft, nontender, no hepatosplenomegaly, no distention.  Skin: Intact without lesions or rashes.  Neurologic: Alert and oriented x 3.  Psych: Normal affect. Extremities: No clubbing or cyanosis.  HEENT: Normal.   Recent Labs: 01/30/2020: B Natriuretic Peptide 31.0; BUN 43; Creatinine, Ser 1.27; Potassium 3.6; Sodium 139  Personally reviewed   Wt Readings from Last 3 Encounters:  01/30/20  56.1 kg (123 lb 9.6 oz)  10/30/19 57.1 kg (125 lb 12.8 oz)  07/26/19 58.5 kg (129 lb)      ASSESSMENT AND PLAN:  1. 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 She was very short of breath with exertion and had very elevated right-sided filling pressures on 5/17 RHC.  PA pressure was systemic. Cardiac output low, but not moving towards IV Flolan given the mixed etiology of her PH. V/Q scan not suggestive of chronic PEs and autoimmune serologies sent (RF negative, ANCA negative, ANA only weakly positive (1:80).She has been unable to tolerate selexipag, Tyvaso, or riociguat. Echo was done in 12/20, unable to estimate PA systolic pressure but RV appeared normal in size and systolic function.  6 minute walk today was mildly less, volume status looks stable.  Symptoms stable.   - Continue sildenafil 80 mg tid.  - Continue Opsumit 10 mg daily.  - BNP today.  2. COPD: On home oxygen.  Prior long-time smoker. No change.   3. Chronic diastolic CHF with prominent RV failure: Echo 09/2018 with RV function improved, near normal. NYHA class II symptoms. Weight stable. Echo in 12/20 showed a mid-cavity gradient with vigorous LV contraction.  - Continue lasix 80 mg BID. BMET/BNP.   - Continue bisoprolol 2.5 mg daily (beta-1 selective beta blocker with COPD) with mid-cavity gradient, she thinks bisoprolol makes her feel tired so does not want to increase.  4. SVT: Denies palpitations.  5. OSA: Has been unable to tolerate CPAP.  She uses oxygen at night.  OSA may have small contribution to [facility] but does not explain the extent of PAH.  6. Hyperlipidemia - Continue statin.  7. HTN: Adding bisoprolol as above.  8. Aortic stenosis: Mild.   Followup in 3 months.   Signed, Loralie Champagne, MD  01/30/2020  [place] [address] [facility] and [place]  [address] [phone] (office) [phone] (fax)

## 2020-01-30 NOTE — Patient Instructions (Signed)
NO medication changes today!  Labs today We will only contact you if something comes back abnormal or we need to make some changes. Otherwise no news is good news!  Your physician recommends that you schedule a follow-up appointment in: 3 months with Dr Aundra Dubin  Please call office at [phone] option 2 if you have any questions or concerns.   At the [facility], you and your health needs are our priority. As part of our continuing mission to provide you with exceptional heart care, we have created designated Provider Care Teams. These Care Teams include your primary Cardiologist (physician) and Advanced Practice Providers (APPs- Physician Assistants and Nurse Practitioners) who all work together to provide you with the care you need, when you need it.   You may see any of the following providers on your designated Care Team at your next follow up: Marland Kitchen Dr Glori Bickers . Dr Loralie Champagne . Darrick Grinder, NP . Lyda Jester, PA . Audry Riles, PharmD   Please be sure to bring in all your medications bottles to every appointment.

## 2020-01-30 NOTE — Progress Notes (Signed)
6 minute walk: Ambulated 182 meters. Utilized 2-3L O2.  Starting HR 87, Starting O2 97%.  HR range 87-131. O2 89-97%. Ending vitals HR 97, O2 93%. Tolerated well

## 2020-02-01 ENCOUNTER — Other Ambulatory Visit (HOSPITAL_COMMUNITY): Payer: Self-pay | Admitting: Cardiology

## 2020-03-15 ENCOUNTER — Encounter: Payer: Self-pay | Admitting: Internal Medicine

## 2020-03-15 ENCOUNTER — Other Ambulatory Visit: Payer: Self-pay

## 2020-03-15 ENCOUNTER — Ambulatory Visit (INDEPENDENT_AMBULATORY_CARE_PROVIDER_SITE_OTHER): Payer: Medicare HMO | Admitting: Internal Medicine

## 2020-03-15 DIAGNOSIS — J9611 Chronic respiratory failure with hypoxia: Secondary | ICD-10-CM | POA: Diagnosis not present

## 2020-03-15 DIAGNOSIS — J9612 Chronic respiratory failure with hypercapnia: Secondary | ICD-10-CM | POA: Diagnosis not present

## 2020-03-15 DIAGNOSIS — J449 Chronic obstructive pulmonary disease, unspecified: Secondary | ICD-10-CM | POA: Diagnosis not present

## 2020-03-15 DIAGNOSIS — I272 Pulmonary hypertension, unspecified: Secondary | ICD-10-CM | POA: Diagnosis not present

## 2020-03-15 MED ORDER — TRELEGY ELLIPTA 100-62.5-25 MCG/INH IN AEPB: 1.0000 | INHALATION_SPRAY | Freq: Every day | RESPIRATORY_TRACT | 5 refills | Status: DC

## 2020-03-15 NOTE — Progress Notes (Signed)
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 Some worse in spring and ? Summer with hot humid weather with subjective wheeze while on spiriva x 2 years then advair 7/13 and prednisone has helped to point where need neb less "only  3 x day" and puffer 3-4 times as well. Assoc with voice change/ hoarsenss, ear pain.   No unusual cough, purulent sputum or sinus/hb symptoms on present rx. rec Work on inhaler technique:   Only use your albuterol as a rescue medication (proaire is Plan B,  Nebulizer is C) Add pepcid 20 mg one every night at bedtime GERD diet office visit in 4 weeks, sooner if needed with PFTs> GOLD III criteria > symb 160 2bid prn saba        Admit date: 06/22/2017 Discharge date: 06/24/2017  Brief/Interim Summary: For complete details see H&P but in brief. Beth [name] is a 79 year old female with medical history of hypertension, hyperlipidemia, diabetes mellitus, COPD on 2 L oxygen at home, asthma, GERD, diastolic CHF, pulmonary hypertension, chronic kidney disease stage III who presented with confusion and slurred speech. Patient daughter reported that patient became confused and noted mild slurred speech but no unilateral weakness. Upon ED evaluation patient was already alert and oriented 3 with no weakness or slurred speech. She was found to be hypotensive with blood pressure of 79/42 which improved after IV fluid bolus. CT scan of the head did not show any acute abnormalities and she was placed on observation for further evaluation. MRI of the head was done with negative results. On blood work up her creatinine was  found to be slight worsening and this was treated with IV fluids. Patient clinically improved, was elevated by physical therapy who recommended PT/OT at home. Blood pressure has remained stable and no further neurologic deficit has been noted.  Subjective: Patient seen and examined on the day of discharge she reports doing much better than when she was admitted. Has no complaint. Her breathing is at baseline. Denies any weakness, dizziness, chest pain, palpitations and blurry vision. Patient remains afebrile, tolerating diet well. Kidney function has improved  Discharge Diagnoses/Hospital Course:  Hypotension Multifactorial, patient on multiple blood pressure lowering agents, diuretics and medications for pulmonary hypertension. She was treated with IV fluids which improved blood pressure. During hospital stay, home medications were held, and blood pressure went above goal with IV fluids. Macitentan and Riociguat were resume and blood pressure up on discharge was more stable. Valsartan and HCTZ were held and will continue to be on hold until patient is seen by cardiology or primary care physician.  Acute metabolic encephalopathy Hypotension and dehydration may have contributed to her AMS, although would not explain slurred speech. Workup so far reassuring as MRI and CT of the head not show any acute abnormality. Patient is back to baseline, she is alert and oriented 3 and her speech is clear. Will defer to PCP if further workup is necessary.  Acute renal failure on chronic kidney disease Baseline creatinine is 1.0, pulmonary admission serum creatinine was 2.0. Highly secondary to  dehydration and continuation of ARB is, diuretics and NSAIDs. Patient also hypotensive ATN maybe a component. Serum creatinine back to baseline, improving with IV fluids. Patient was encouraged to follow-up with primary care physician. And encouraged to keep good oral hydration.   COPD - stable No changes in  medication were made during hospital stay  Chronic diastolic heart failure Patient was dry during hospital stay. She was given IV fluid therefore will resume Lasix upon discharge. Follow-up with cardiology  Hypertension Patient was admitted due to hypotension Valsartan and HCTZ were placed on hold, and will continue to be on hold until patient is evaluated by cardiology or primary care physician. Patient on medications for pulmonary hypertension which have fairly significant effect on lowering BP.   Pulmonary hypertension Continue Macitentan and Riociguat  All other chronic medical condition were stable during the hospitalization.  Patient seen by PT recommending [facility]/OT  On the day of the discharge the patient's vitals were stable, and no other acute medical condition were reported by patient. Patient was felt safe to be discharge to home      07/16/2017  f/u ov/Beth [name] re: COPD/ chronic resp failure/ cor pulmonale on symbicort but unable to use effectively  Chief Complaint  Patient presents with  . Pulmonary Consult    SOB with exertion referred by Dr. Willey Blade  gradually downhill since  may of 2017 with worse breathing no cough increase 02= 2lpm   Doe = MMRC4  = sob if tries to leave home or while getting dressed  rec Plan A = Automatic = Trelegy one click each am - call if any problem acquiring  Plan B = Backup Only use your albuterol as a rescue medication  Plan C = Crisis - only use your albuterol nebulizer if you first try Plan B and it fails to help      12/27/2017  f/u ov/Beth [name] re:  GOLD III/ 02  19/24 2lpm  With cor pulmonale  Chief Complaint  Patient presents with  . Follow-up    Breathing has improved. She states she has not had to use her albuterol inhaler or neb.   Dyspnea:   MMRC3 = can't walk 100 yards even at a slow pace at a flat grade s stopping due to sob on 2lpm  Cough: no Sleep: ok on 2lpm one pillows  rec Plan A = Automatic = Trelegy one  click each am    Plan B = Backup Only use your albuterol as a rescue medication Plan C = Crisis - only use your albuterol nebulizer if you first try Plan B and it fails to help > ok to use the nebulizer up to every 4 hours but if start needing it regularly call for immediate appointment Please schedule a follow up visit in 6  months but call sooner if needed - PLEASE BRING ALL INHALERS/SOLUTIONS WITH YOU ON RETURN     06/28/2018  f/u ov/Beth [name] re:  GOLD III/ 02 dep  Chief Complaint  Patient presents with  . Follow-up    Breathing is overall doing well. She is using her albuterol inhaler about once per wk and rarely uses neb.   Dyspnea:  foodlion  MMRC3 = can't walk 100 yards even at a slow pace at a flat grade s stopping due to sob  On 2lpm Cough: no Sleeping: one pillow and 2lpm  SABA use: rare 02: does not vary = 2lpm  rec No  Change in medications  02 is 2lpm at  bedtime and ok to adjust during the day to keep your 02 saturations above 90% (no need for 02 at all if over 90% without it) Please schedule a follow up visit in 6 months but call sooner if needed  with all medications /inhalers/ solutions in hand so we can verify exactly what you are taking. This includes all medications from all doctors and over the counters    01/02/2019  f/u ov/Beth [name] re:  GOLD III/ 02 dep 2lpm does not adjust/ no meds  Chief Complaint  Patient presents with  . Follow-up    6 month follow up = patient stated her breathing has been good. Has not needed to use her rescue medications often.  Dyspnea:  Can still do food lion but nothing bigger on 2lpm slow pace = MMRC3 = can't walk 100 yards even at a slow pace at a flat grade s stopping due to sob   Cough: none/ but hoarse  Sleeping: one pillow / bed is flat SABA use: rarely use hfa/ much less neb saba rec Remember to hold the device with right hand and open with left and use arm and hammer and brush teeth after using first thing in am  Adjust your 02 if  needed with activity to be sure you are above 90%       03/15/2020  f/u ov/Beth [name] re: copd GOLD III/ on trelegy plus 02 2lpm  24/7 does not adjust it  Chief Complaint  Patient presents with  . Follow-up  Dyspnea:  Still able to do food lion but not the whole store = MMRC3 = can't walk 100 yards even at a slow pace at a flat grade s stopping due to sob    Cough: no Sleeping: ok flat / 2 pillows  SABA use: rarely 02: 2lpm    No obvious day to day or daytime variability or assoc excess/ purulent sputum or mucus plugs or hemoptysis or cp or chest tightness, subjective wheeze or overt sinus or hb symptoms.   Seleeping  without nocturnal  or early am exacerbation  of respiratory  c/o's or need for noct saba. Also denies any obvious fluctuation of symptoms with weather or environmental changes or other aggravating or alleviating factors except as outlined above   No unusual exposure hx or h/o childhood pna/ asthma or knowledge of premature birth.  Current Allergies, Complete Past Medical History, Past Surgical History, Family History, and Social History were reviewed in [employer] record.  ROS  The following are not active complaints unless bolded Hoarseness, sore throat, dysphagia, dental problems, itching, sneezing,  nasal congestion or discharge of excess mucus or purulent secretions, ear ache,   fever, chills, sweats, unintended wt loss or wt gain, classically pleuritic or exertional cp,  orthopnea pnd or arm/hand swelling  or leg swelling better , presyncope, palpitations, abdominal pain, anorexia, nausea, vomiting, diarrhea  or change in bowel habits or change in bladder habits, change in stools or change in urine, dysuria, hematuria,  rash, arthralgias, visual complaints, headache, numbness, weakness or ataxia or problems with walking or coordination,  change in mood or  memory.        Current Meds  Medication Sig  . albuterol (PROVENTIL) (2.5 MG/3ML) 0.083%  nebulizer solution Take 2.5 mg by nebulization every 6 (six) hours as needed for wheezing or shortness of breath.  Marland Kitchen albuterol (VENTOLIN HFA) 108 (90 Base) MCG/ACT inhaler INHALE 2 PUFFS INTO THE LUNGS EVERY 4 HOURS AS NEEDED ONLY IF YOU  CANT CATCH YOUR BREATH  . allopurinol (ZYLOPRIM) 300 MG tablet Take 300 mg by mouth daily.  . ASPIRIN ADULT LOW STRENGTH 81 MG EC tablet TAKE 1 TABLET BY MOUTH EVERY DAY  . atorvastatin (LIPITOR) 80 MG tablet TAKE 1 TABLET BY MOUTH EVERY DAY  . b complex vitamins tablet Take 1 tablet by mouth daily.  . B Complex-Folic Acid (B COMPLEX-VITAMIN B12 PO) B Complex Vitamin  . bisoprolol (ZEBETA) 5 MG tablet TAKE 1/2 TABLET BY MOUTH DAILY  . Cholecalciferol (VITAMIN D3) 1000 units CAPS Take 1,000 Units by mouth daily.  . clobetasol cream (TEMOVATE) 0.05 %   . co-enzyme Q-10 30 MG capsule Take 30 mg by mouth daily.  . Fluticasone-Umeclidin-Vilant (TRELEGY ELLIPTA) 100-62.5-25 MCG/INH AEPB Inhale 1 puff into the lungs daily.  . furosemide (LASIX) 80 MG tablet TAKE 1 TABLET (80 MG TOTAL) BY MOUTH 2 (TWO) TIMES DAILY.  Marland Kitchen loratadine (CLARITIN) 10 MG tablet Take 10 mg by mouth daily.  . macitentan (OPSUMIT) 10 MG tablet Take 1 tablet (10 mg total) by mouth daily.  . meloxicam (MOBIC) 15 MG tablet Take 1 tablet by mouth as needed.  . metFORMIN (GLUCOPHAGE) 500 MG tablet Take 500 mg by mouth 2 (two) times daily with a meal.  . Omega 3 1000 MG CAPS Take 1 capsule by mouth daily.  Marland Kitchen omeprazole (PRILOSEC) 40 MG capsule Take 40 mg by mouth daily.  . OXYGEN Inhale 2 L into the lungs continuous.  . potassium chloride SA (KLOR-CON M20) 20 MEQ tablet Take 3 tablets (60 mEq total) by mouth daily.  . sildenafil (REVATIO) 20 MG tablet Take 4 tablets (80 mg total) by mouth 3 (three) times daily.  . Zinc Gluconate 100 MG TABS zinc gluconate 100 mg tablet  Take 1 tablet every day by oral route.  .                  Objective:   Physical Exam  03/15/2020        117  07/12/2019         127  01/02/2019        124  06/28/2018       128  12/27/2017       125   07/16/17 131 lb 12.8 oz (59.8 kg)  06/07/17 137 lb 4 oz (62.3 kg)  04/13/17 143 lb 8 oz (65.1 kg)    amb bf min hoarse   Vital signs reviewed  03/15/2020  - Note at rest 02 sats  99% on continuous     reports Top dentures HEENT : pt wearing mask not removed for exam due to covid - 19 concerns.    NECK :  without JVD/Nodes/TM/ nl carotid upstrokes bilaterally   LUNGS: no acc muscle use,  Mild barrel  contour chest wall with bilateral  Distant bs s audible wheeze and  without cough on insp or exp maneuvers  and mild  Hyperresonant  to  percussion bilaterally     CV:  RRR  no s3 or murmur or increase in P2, and no edema   ABD:  soft and nontender with pos end  insp Hoover's  in the supine position. No bruits or organomegaly appreciated, bowel sounds nl  MS:   Nl gait/  ext warm without deformities, calf tenderness, cyanosis or clubbing No obvious joint restrictions   SKIN: warm and dry with venous stasis changes both LE's    NEURO:  alert, approp, nl sensorium with  no motor or  cerebellar deficits apparent.                      Assessment & Plan:

## 2020-03-15 NOTE — Patient Instructions (Signed)
Only use your albuterol as a rescue medication to be used if you can't catch your breath by resting or doing a relaxed purse lip breathing pattern.  - The less you use it, the better it will work when you need it. - Ok to use up to 2 puffs  every 4 hours if you must but call for immediate appointment if use goes up over your usual need - Don't leave home without it !!  (think of it like the spare tire for your car)    Please schedule a follow up visit in 12 months but call sooner if needed

## 2020-03-16 ENCOUNTER — Encounter: Payer: Self-pay | Admitting: Internal Medicine

## 2020-03-16 NOTE — Assessment & Plan Note (Signed)
See [facility]  03/23/16 with PVR = 15 but pcwp 20  Echo 06/07/17 - Normal LV size with EF 60-65%. Mildly dilated RV with normal   systolic function. D-shaped intraventricular septum suggesting a   degree of RV pressure/volume overload. Mild mitral regurgitation.   Mild aortic stenosis (though not fully interrogated). Mild   pulmonary hypertension. IVC not dilated. Echo 09/20/18 Mild AS. Normal RV function. Unable to estimate PAS  Well compensated on present rx/ no changes needed

## 2020-03-16 NOTE — Assessment & Plan Note (Signed)
HC03  06/23/17 = 31  But as high as 35 one month prior HCO3  12/14/17 = 29  - 06/28/2018  Walked 2lpm  x 3 laps @ 185 ft each stopped due to  End of study, slow pace, no  desat  And mild sob - HC03  12/07/18 = 32  - 01/02/2019   Walked 2lpm   2 laps @  approx 214f each @ slow pace  stopped due to sob s desats    - HCO3   04/21/19  = 27 c/w resolving hypercarbia - RA sats 96% 07/12/2019   Adequate control on present rx, reviewed in detail with pt > no change in rx needed            Each maintenance medication was reviewed in detail including emphasizing most importantly the difference between maintenance and prns and under what circumstances the prns are to be triggered using an action plan format where appropriate.  Total time for H and P, chart review, counseling,  Reviewing  device and generating customized AVS unique to this office visit / charting = 20 min

## 2020-03-16 NOTE — Assessment & Plan Note (Signed)
Quit smoking around 2006-7    - PFT's 06/30/2012 FEV1  0.93 (49%) ratio 49 and DLCO 47 corrects to 87   - 07/16/2017   > trial of trelegy  - Spirometry 07/16/2017  FEV1 0.57 (38%)  Ratio 54 with atypical f/v with no rx prior  - 01/02/2019  After extensive coaching inhaler device,  effectiveness =    90% with elipta vs 75% baseline     Adequate control on present rx, reviewed in detail with pt > no change in rx needed

## 2020-03-18 ENCOUNTER — Other Ambulatory Visit (HOSPITAL_COMMUNITY): Payer: Self-pay | Admitting: Cardiology

## 2020-03-28 ENCOUNTER — Other Ambulatory Visit: Payer: Self-pay | Admitting: Internal Medicine

## 2020-03-28 DIAGNOSIS — R600 Localized edema: Secondary | ICD-10-CM

## 2020-04-02 ENCOUNTER — Ambulatory Visit
Admission: RE | Admit: 2020-04-02 | Discharge: 2020-04-02 | Disposition: A | Discharge status: Home / Self Care | Payer: Medicare HMO | Source: Ambulatory Visit | Attending: Internal Medicine | Admitting: Internal Medicine

## 2020-04-02 ENCOUNTER — Other Ambulatory Visit: Payer: Self-pay

## 2020-04-02 DIAGNOSIS — R600 Localized edema: Secondary | ICD-10-CM

## 2020-04-12 ENCOUNTER — Other Ambulatory Visit: Payer: Self-pay | Admitting: Internal Medicine

## 2020-05-03 ENCOUNTER — Telehealth (HOSPITAL_COMMUNITY): Payer: Self-pay | Admitting: Pharmacist

## 2020-05-03 NOTE — Telephone Encounter (Signed)
Patient Advocate Encounter   Received notification from [facility] that prior authorization for sildenafil is required.   PA submitted via phone Reference #: [id number] Status is pending   Will continue to follow.  Audry Riles, PharmD, BCPS, BCCP, CPP Heart Failure Clinic Pharmacist [phone]

## 2020-05-06 NOTE — Telephone Encounter (Signed)
Advanced Heart Failure Patient Advocate Encounter  Prior Authorization for Sildenafil has been approved.    Effective dates: 05/06/20 through 11/15/20  Audry Riles, PharmD, BCPS, BCCP, CPP Heart Failure Clinic Pharmacist [phone]

## 2020-05-09 ENCOUNTER — Encounter (HOSPITAL_COMMUNITY): Payer: Medicare HMO | Admitting: Cardiology

## 2020-06-14 ENCOUNTER — Other Ambulatory Visit (HOSPITAL_COMMUNITY): Payer: Self-pay | Admitting: Cardiology

## 2020-06-17 ENCOUNTER — Other Ambulatory Visit (HOSPITAL_COMMUNITY): Payer: Self-pay | Admitting: *Deleted

## 2020-06-17 NOTE — Telephone Encounter (Signed)
Pt left VM requesting a medication refill. I called pt to ask which medication needed to be filled. No answer/no VM.

## 2020-07-25 ENCOUNTER — Other Ambulatory Visit: Payer: Self-pay

## 2020-07-25 ENCOUNTER — Ambulatory Visit (HOSPITAL_COMMUNITY)
Admission: RE | Admit: 2020-07-25 | Discharge: 2020-07-25 | Disposition: A | Discharge status: Home / Self Care | Payer: Medicare HMO | Source: Ambulatory Visit | Attending: Cardiology | Admitting: Cardiology

## 2020-07-25 VITALS — BP 158/62 | HR 88 | Ht 62.0 in | Wt 120.0 lb

## 2020-07-25 DIAGNOSIS — Z8673 Personal history of transient ischemic attack (TIA), and cerebral infarction without residual deficits: Secondary | ICD-10-CM | POA: Insufficient documentation

## 2020-07-25 DIAGNOSIS — Z7982 Long term (current) use of aspirin: Secondary | ICD-10-CM | POA: Diagnosis not present

## 2020-07-25 DIAGNOSIS — I272 Pulmonary hypertension, unspecified: Secondary | ICD-10-CM

## 2020-07-25 DIAGNOSIS — I130 Hypertensive heart and chronic kidney disease with heart failure and stage 1 through stage 4 chronic kidney disease, or unspecified chronic kidney disease: Secondary | ICD-10-CM | POA: Diagnosis not present

## 2020-07-25 DIAGNOSIS — Z7984 Long term (current) use of oral hypoglycemic drugs: Secondary | ICD-10-CM | POA: Insufficient documentation

## 2020-07-25 DIAGNOSIS — J449 Chronic obstructive pulmonary disease, unspecified: Secondary | ICD-10-CM | POA: Insufficient documentation

## 2020-07-25 DIAGNOSIS — E1122 Type 2 diabetes mellitus with diabetic chronic kidney disease: Secondary | ICD-10-CM | POA: Diagnosis not present

## 2020-07-25 DIAGNOSIS — Z9981 Dependence on supplemental oxygen: Secondary | ICD-10-CM | POA: Insufficient documentation

## 2020-07-25 DIAGNOSIS — Z79899 Other long term (current) drug therapy: Secondary | ICD-10-CM | POA: Diagnosis not present

## 2020-07-25 DIAGNOSIS — G4733 Obstructive sleep apnea (adult) (pediatric): Secondary | ICD-10-CM | POA: Diagnosis not present

## 2020-07-25 DIAGNOSIS — E785 Hyperlipidemia, unspecified: Secondary | ICD-10-CM | POA: Diagnosis not present

## 2020-07-25 DIAGNOSIS — Z791 Long term (current) use of non-steroidal anti-inflammatories (NSAID): Secondary | ICD-10-CM | POA: Insufficient documentation

## 2020-07-25 DIAGNOSIS — I471 Supraventricular tachycardia: Secondary | ICD-10-CM | POA: Insufficient documentation

## 2020-07-25 DIAGNOSIS — I5032 Chronic diastolic (congestive) heart failure: Secondary | ICD-10-CM | POA: Diagnosis not present

## 2020-07-25 DIAGNOSIS — Z7951 Long term (current) use of inhaled steroids: Secondary | ICD-10-CM | POA: Insufficient documentation

## 2020-07-25 DIAGNOSIS — Z87891 Personal history of nicotine dependence: Secondary | ICD-10-CM | POA: Insufficient documentation

## 2020-07-25 DIAGNOSIS — I350 Nonrheumatic aortic (valve) stenosis: Secondary | ICD-10-CM | POA: Diagnosis not present

## 2020-07-25 LAB — LIPID PANEL
Cholesterol: 218 mg/dL — ABNORMAL HIGH (ref 0–200)
HDL: 103 mg/dL (ref >40)
LDL Cholesterol: 106 mg/dL — ABNORMAL HIGH (ref 0–99)
Total CHOL/HDL Ratio: 2.1 RATIO
Triglycerides: 47 mg/dL (ref <150)
VLDL: 9 mg/dL (ref 0–40)

## 2020-07-25 LAB — BASIC METABOLIC PANEL
Anion gap: 12 (ref 5–15)
BUN: 38 mg/dL — ABNORMAL HIGH (ref 8–23)
CO2: 29 mmol/L (ref 22–32)
Calcium: 9.8 mg/dL (ref 8.9–10.3)
Chloride: 98 mmol/L (ref 98–111)
Creatinine, Ser: 1.29 mg/dL — ABNORMAL HIGH (ref 0.44–1.00)
GFR calc Af Amer: 46 mL/min — ABNORMAL LOW (ref >60)
GFR calc non Af Amer: 39 mL/min — ABNORMAL LOW (ref >60)
Glucose, Bld: 123 mg/dL — ABNORMAL HIGH (ref 70–99)
Potassium: 4 mmol/L (ref 3.5–5.1)
Sodium: 139 mmol/L (ref 135–145)

## 2020-07-25 LAB — BRAIN NATRIURETIC PEPTIDE: B Natriuretic Peptide: 68.1 pg/mL (ref 0.0–100.0)

## 2020-07-25 MED ORDER — BISOPROLOL FUMARATE 5 MG PO TABS: 5.0000 mg | ORAL_TABLET | Freq: Every day | ORAL | 4 refills | Status: DC

## 2020-07-25 NOTE — Patient Instructions (Addendum)
INCREASE Bisoprolol 22m Daily  Labs done today, your results will be available in MyChart, we will contact you for abnormal readings.  Please call our office in December to schedule your follow up appointment and echocardiogram   If you have any questions or concerns before your next appointment please send [place] message through [place] call our office at [phone]    TO LEAVE A MESSAGE FOR THE NURSE SELECT OPTION 2, PLEASE LEAVE A MESSAGE INCLUDING: . YOUR NAME . DATE OF BIRTH . CALL BACK NUMBER . REASON FOR CALL**this is important as we prioritize the call backs  [place] LONG AS YOU CALL BEFORE 4:00 PM  At the [facility] you and your health needs are our priority. As part of our continuing mission to provide you with exceptional heart care, we have created designated Provider Care Teams. These Care Teams include your primary Cardiologist (physician) and Advanced Practice Providers (APPs- Physician Assistants and Nurse Practitioners) who all work together to provide you with the care you need, when you need it.   You may see any of the following providers on your designated Care Team at your next follow up: .Marland KitchenDr DGlori Bickers. Dr DLoralie Champagne. ADarrick Grinder NP . BLyda Jester PA . LAudry Riles PharmD   Please be sure to bring in all your medications bottles to every appointment.

## 2020-07-25 NOTE — Progress Notes (Signed)
6 Min Walk Test Completed  Pt ambulated 243.60mO2 Sat ranged 87-99 on 2L oxygen HR ranged 90-125

## 2020-07-25 NOTE — Progress Notes (Signed)
Date:  07/25/2020   ID:  [name], DOB 1941/03/31, MRN [id number]    Provider location: [facility] Type of Visit: Established patient  PCP:  Willey Blade, MD  Cardiologist:  Dr. Aundra Dubin  Chief Complaint: Shortness of breath   History of Present Illness: Beth [name] is a 78 y.o. female who has a history of COPD on home oxygen, untreated OSA, chronic diastolic CHF/RV failure, and pulmonary hypertension.  She was admitted in 5/17 with acute on chronic diastolic CHF with prominent RV failure and marked shortness of breath.  She was diuresed with IV Lasix.  Echo showed dilated and dysfunctional RV with pulmonary hypertension.  RHC showed marked pulmonary hypertension with systemic PA pressure.  She was started on Revatio in the hospital and this was titrated up.  Subsequently, she was started on macitentan.  She next started Selexipag but she had intractable side effects with selexipag so stopped it.  I then tried her on Tyvaso, but she has developed abdominal discomfort and diarrhea with Tyvaso and has had to stop it (symptomatic even at lowest dose). She did not feel like it helped her breathing when she was on it.  She stopped sildenafil and started riociguat. However, she developed painful hand swelling on riociguat and has had to stop it.  Symptoms resolved off riociguat.    She was admitted in 8/18 with hypotension and AKI.  Valsartan and HCTZ stopped.     Echo in 11/19 showed EF 60-65%, mild AS, normal RV size and systolic function.    Echo in 12/20 showed EF 65-70%, LV mid-cavity gradient to 65 mmHg, mild AS with mean gradient 14 mmHg, normal RV size and systolic function, IVC normal, no TR jet.   She returns for followup of pulmonary hypertension.  Weight is down 3 lbs.  She feels like her exercise tolerance is improving.  No dyspnea walking around the house.  She does get short of breath after walking 100-200 feet.  No chest pain.  No  lightheadedness.    ECG (personally reviewed): NSR, RBBB  6 minute walk (7/17): 238 m 6 minute walk (10/17): 171 m 6 minute walk (3/18): 231 m 6 minute walk (11/18): 123 m 6 minute walk (1/19): 213 m 6 minute walk (10/19): 231 m 6 minute walk (9/20): 122 m 6 minute walk (11/20): 213 m 6 minute walk (3/21): 182 m 6 minute walk (9/21): 243 m  Labs (5/17): K 4.7 => 4, creatinine 0.96 => 1.04 Labs (8/17): K 4.1, creatinine 1.5 Labs (9/17): K 3.5, creatinine 1.13 Labs (10/17): K 3.6, creatinine 1.0 Labs (1/18): K 3.9, creatinine 1.14, BNP 34 Labs (2/18): K 4.1, creatinine 1.07, BNP 25 Labs (3/18): K 3.8, creatinine 1.06, BNP 25 Labs (8/18): K 4.6, creatinine 1.38, hgb 9.1 Labs (9/18): BNP 8.7 Labs (11/18): BNP 36, K 3.6, creatinine 0.65 Labs (6/20): K 3.6, creatinine 1.17, BNP 21.6 Labs (9/20): K 3.3, creatinine 0.91, BNP 25 Labs (12/20): K 3.6, creatinine 1.06, BNP 21 Labs (3/21): K 3.6, creatinine 1.27, BNP 31  PMH: 1. H/o CVA 2. DM 3. HTN 4. Hyperlipidemia 5. OSA: Cannot tolerate CPAP, has tried multiple masks.  6. COPD: She is on home oxygen. 7. SVT 8. Chronic diastolic CHF with RV failure: Echo (5/17) with EF 55%, severely dilated RV with moderately decreased systolic function, D-shaped interventricular septum.  - Echo (7/18): EF 60-65%, D-shaped interventricular septum, mild RV dilation with normal systolic function, PASP 41, IVC normal,  mild MR, mild AS.  - Echo (11/19): EF 60-65%, mild AS, normal RV size and systolic function.  - Echo (12/20): EF 65-70%, LV mid-cavity gradient to 65 mmHg, mild AS with mean gradient 14 mmHg, normal RV size and systolic function, IVC normal, no TR jet.  9. 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  V/Q scan 5/17 with no evidence for chronic PE.  ANA weakly positive (1:80), ANCA negative, RF negative.  RHC (5/17) with mean RA 20, PA 110/45  mean 66, mean PCWP 20, CI 1.7, PVR 15 WU.  - Intractable side effects with selexipag.  - Did not tolerate Tyvaso - Did not tolerate riociguat.  10. CKD 11. Aortic stenosis: Mild.   Current Outpatient Medications  Medication Sig Dispense Refill  . albuterol (PROVENTIL) (2.5 MG/3ML) 0.083% nebulizer solution Take 2.5 mg by nebulization every 6 (six) hours as needed for wheezing or shortness of breath.    Marland Kitchen albuterol (VENTOLIN HFA) 108 (90 Base) MCG/ACT inhaler INHALE 2 PUFFS INTO THE LUNGS EVERY 4 HOURS AS NEEDED ONLY IF YOU CANT CATCH YOUR BREATH 18 Inhaler 2  . allopurinol (ZYLOPRIM) 300 MG tablet Take 300 mg by mouth daily.    Marland Kitchen amLODipine (NORVASC) 10 MG tablet Take 1 tablet (10 mg total) by mouth at bedtime. 90 tablet 3  . ASPIRIN ADULT LOW STRENGTH 81 MG EC tablet TAKE 1 TABLET BY MOUTH EVERY DAY 90 tablet 3  . atorvastatin (LIPITOR) 80 MG tablet TAKE 1 TABLET BY MOUTH EVERY DAY 90 tablet 3  . b complex vitamins tablet Take 1 tablet by mouth daily.    . bisoprolol (ZEBETA) 5 MG tablet Take 1 tablet (5 mg total) by mouth daily. 30 tablet 4  . Cholecalciferol (VITAMIN D3) 1000 units CAPS Take 1,000 Units by mouth daily.    . clobetasol cream (TEMOVATE) 1.61 % Apply 1 application topically as needed.     Marland Kitchen co-enzyme Q-10 30 MG capsule Take 30 mg by mouth daily.    . Fluticasone-Umeclidin-Vilant (TRELEGY ELLIPTA) 100-62.5-25 MCG/INH AEPB Inhale 1 puff into the lungs daily. 180 each 5  . furosemide (LASIX) 80 MG tablet TAKE 1 TABLET (80 MG TOTAL) BY MOUTH 2 (TWO) TIMES DAILY. 180 tablet 2  . loratadine (CLARITIN) 10 MG tablet Take 10 mg by mouth as needed.     . macitentan (OPSUMIT) 10 MG tablet Take 1 tablet (10 mg total) by mouth daily. 30 tablet 11  . meloxicam (MOBIC) 15 MG tablet Take 1 tablet by mouth as needed.    . metFORMIN (GLUCOPHAGE) 500 MG tablet Take 500 mg by mouth 2 (two) times daily with a meal.    . Omega 3 1000 MG CAPS Take 1 capsule by mouth daily.    Marland Kitchen omeprazole  (PRILOSEC) 40 MG capsule Take 40 mg by mouth daily.    . OXYGEN Inhale 2 L into the lungs continuous.    . potassium chloride SA (KLOR-CON M20) 20 MEQ tablet Take 3 tablets (60 mEq total) by mouth daily. 270 tablet 6  . sildenafil (REVATIO) 20 MG tablet TAKE 4 TABLETS (80 MG TOTAL) BY MOUTH 3 (THREE) TIMES DAILY. 1080 tablet 2   No current facility-administered medications for this encounter.    Allergies:   Aldomet [methyldopa] and Tyvaso [treprostinil]   Social History:  The patient  reports that she quit smoking about 15 years ago. Her smoking use included cigarettes. She  has a 60.00 pack-year smoking history. She has quit using smokeless tobacco. She reports that she does not drink alcohol and does not use drugs.   Family History:  The patient's family history includes Heart disease in her brother, brother, mother, sister, and sister; Heart failure in her father and mother.   ROS:  Please see the history of present illness.   All other systems are personally reviewed and negative.   Exam:   BP (!) 158/62   Pulse 88   Ht 5' 2" (1.575 m)   Wt 54.4 kg (120 lb)   SpO2 95%   BMI 21.95 kg/m  General: NAD Neck: No JVD, no thyromegaly or thyroid nodule.  Lungs: Distant BS. CV: Nondisplaced PMI.  Heart regular S1/S2, no S3/S4, 1/6 SEM RUSB. 1+ right ankle edema.  No carotid bruit.  Normal pedal pulses.  Abdomen: Soft, nontender, no hepatosplenomegaly, no distention.  Skin: Intact without lesions or rashes.  Neurologic: Alert and oriented x 3.  Psych: Normal affect. Extremities: No clubbing or cyanosis.  HEENT: Normal.   Recent Labs: 07/25/2020: B Natriuretic Peptide 68.1; BUN 38; Creatinine, Ser 1.29; Potassium 4.0; Sodium 139  Personally reviewed   Wt Readings from Last 3 Encounters:  07/25/20 54.4 kg (120 lb)  03/15/20 53.1 kg (117 lb)  01/30/20 56.1 kg (123 lb 9.6 oz)    ASSESSMENT AND PLAN:  1. Pulmonary hypertension: Severe pulmonary hypertension, suspect mixed group 3  (COPD, untreated OSA) and group 1 PH (out of proportion to COPD). She was very short of breath with exertion and had very elevated right-sided filling pressures on 5/17 RHC.  PA pressure was systemic. Cardiac output low, but not moving towards IV Flolan given the mixed etiology of her PH. V/Q scan not suggestive of chronic PEs and autoimmune serologies sent (RF negative, ANCA negative, ANA only weakly positive (1:80).She has been unable to tolerate selexipag, Tyvaso, or riociguat. Echo was done in 12/20, unable to estimate PA systolic pressure but RV appeared normal in size and systolic function.  6 minute walk today was significantly improved, volume status looks stable.  NYHA class II.   - Continue sildenafil 80 mg tid.  - Continue Opsumit 10 mg daily.  - BNP today.  2. COPD: On home oxygen.  Prior long-time smoker. No change.   3. Chronic diastolic CHF with prominent RV failure: Echo 09/2018 with RV function improved, near normal. NYHA class II symptoms. Weight stable. Echo in 12/20 showed a mid-cavity gradient with vigorous LV contraction.  - Continue lasix 80 mg BID. BMET/BNP.   - Increase bisoprolol to 5 mg daily (beta-1 selective beta blocker with COPD) with mid-cavity gradient and HTN.  4. SVT: Denies palpitations.  5. OSA: Has been unable to tolerate CPAP.  She uses oxygen at night.  OSA may have small contribution to [facility] but does not explain the extent of PAH.  6. Hyperlipidemia - Continue statin, check lipids today.  7. HTN: Increasing bisoprolol as above.   8. Aortic stenosis: Mild.   Followup in 4 months with echo  Signed, Loralie Champagne, MD  07/25/2020  [facility] [address] Heart and [place] [address] [phone] (office) [phone] (fax)

## 2020-07-31 ENCOUNTER — Telehealth (HOSPITAL_COMMUNITY): Payer: Self-pay

## 2020-07-31 DIAGNOSIS — I5032 Chronic diastolic (congestive) heart failure: Secondary | ICD-10-CM

## 2020-07-31 DIAGNOSIS — E785 Hyperlipidemia, unspecified: Secondary | ICD-10-CM

## 2020-07-31 MED ORDER — EZETIMIBE 10 MG PO TABS: 10.0000 mg | ORAL_TABLET | Freq: Every day | ORAL | 11 refills | Status: DC

## 2020-07-31 NOTE — Telephone Encounter (Signed)
-----  Message from Larey Dresser, MD sent at 07/31/2020  1:08 PM EDT ----- Add Zetia 10 mg daily with lipids/LFTs 2 months.

## 2020-07-31 NOTE — Telephone Encounter (Signed)
Patient advised and verbalized understanding. New Rx sent in,lab appt scheduled,lab order entered  Orders Placed This Encounter  Procedures   Lipid Profile    Standing Status:   Future    Standing Expiration Date:   07/31/2021    Order Specific Question:   Release to patient    Answer:   Immediate   Hepatic function panel    Standing Status:   Future    Standing Expiration Date:   07/31/2021    Order Specific Question:   Release to patient    Answer:   Immediate    Meds ordered this encounter  Medications   ezetimibe (ZETIA) 10 MG tablet    Sig: Take 1 tablet (10 mg total) by mouth daily.    Dispense:  30 tablet    Refill:  11

## 2020-08-06 ENCOUNTER — Other Ambulatory Visit (HOSPITAL_COMMUNITY): Payer: Self-pay | Admitting: Cardiology

## 2020-08-12 ENCOUNTER — Encounter (HOSPITAL_COMMUNITY): Payer: Medicare HMO | Admitting: Cardiology

## 2020-08-18 ENCOUNTER — Other Ambulatory Visit (HOSPITAL_COMMUNITY): Payer: Self-pay | Admitting: Internal Medicine

## 2020-08-18 DIAGNOSIS — I509 Heart failure, unspecified: Secondary | ICD-10-CM

## 2020-08-19 ENCOUNTER — Other Ambulatory Visit (HOSPITAL_COMMUNITY): Payer: Self-pay | Admitting: Cardiology

## 2020-09-15 ENCOUNTER — Other Ambulatory Visit (HOSPITAL_COMMUNITY): Payer: Self-pay | Admitting: Cardiology

## 2020-09-15 DIAGNOSIS — I509 Heart failure, unspecified: Secondary | ICD-10-CM

## 2020-10-08 ENCOUNTER — Other Ambulatory Visit: Payer: Self-pay

## 2020-10-08 ENCOUNTER — Ambulatory Visit (HOSPITAL_COMMUNITY)
Admission: RE | Admit: 2020-10-08 | Discharge: 2020-10-08 | Disposition: A | Discharge status: Home / Self Care | Payer: Medicare HMO | Source: Ambulatory Visit | Attending: Cardiology | Admitting: Cardiology

## 2020-10-08 DIAGNOSIS — E785 Hyperlipidemia, unspecified: Secondary | ICD-10-CM | POA: Insufficient documentation

## 2020-10-08 LAB — LIPID PANEL
Cholesterol: 181 mg/dL (ref 0–200)
HDL: 89 mg/dL (ref >40)
LDL Cholesterol: 80 mg/dL (ref 0–99)
Total CHOL/HDL Ratio: 2 RATIO
Triglycerides: 58 mg/dL (ref <150)
VLDL: 12 mg/dL (ref 0–40)

## 2020-10-08 LAB — HEPATIC FUNCTION PANEL
ALT: 15 U/L (ref 0–44)
AST: 20 U/L (ref 15–41)
Albumin: 4 g/dL (ref 3.5–5.0)
Alkaline Phosphatase: 48 U/L (ref 38–126)
Bilirubin, Direct: <0.1 mg/dL (ref 0.0–0.2)
Total Bilirubin: 0.6 mg/dL (ref 0.3–1.2)
Total Protein: 7.1 g/dL (ref 6.5–8.1)

## 2020-10-15 ENCOUNTER — Other Ambulatory Visit (HOSPITAL_COMMUNITY): Payer: Self-pay

## 2020-10-15 DIAGNOSIS — J810 Acute pulmonary edema: Secondary | ICD-10-CM

## 2020-10-15 MED ORDER — OPSUMIT 10 MG PO TABS: 10.0000 mg | ORAL_TABLET | Freq: Every day | ORAL | 11 refills | Status: DC

## 2020-10-23 ENCOUNTER — Other Ambulatory Visit (HOSPITAL_COMMUNITY): Payer: Self-pay | Admitting: Cardiology

## 2020-10-31 ENCOUNTER — Telehealth (HOSPITAL_COMMUNITY): Payer: Self-pay | Admitting: Pharmacist

## 2020-10-31 NOTE — Telephone Encounter (Signed)
Patient Advocate Encounter   Received notification from [facility] that prior authorization for Opsumit is required.   PA submitted on CoverMyMeds Key [id number] Status is pending   Will continue to follow.   Audry Riles, PharmD, BCPS, BCCP, CPP Heart Failure Clinic Pharmacist [phone]

## 2020-11-01 NOTE — Telephone Encounter (Signed)
Advanced Heart Failure Patient Advocate Encounter  Prior Authorization for Opsumit has been approved.    Effective dates: 11/16/20 through 11/15/21   Audry Riles, PharmD, BCPS, BCCP, CPP Heart Failure Clinic Pharmacist [phone]

## 2020-11-05 ENCOUNTER — Telehealth (HOSPITAL_COMMUNITY): Payer: Self-pay | Admitting: Pharmacist

## 2020-11-05 NOTE — Telephone Encounter (Signed)
Patient Advocate Encounter   Received notification from [facility] that prior authorization for sildenafil is required.   PA submitted on CoverMyMeds Key [id number] Status is pending   Will continue to follow.   Audry Riles, PharmD, BCPS, BCCP, CPP Heart Failure Clinic Pharmacist [phone]

## 2020-11-06 NOTE — Telephone Encounter (Signed)
Advanced Heart Failure Patient Advocate Encounter  Prior Authorization for sildenafil has been approved.    Effective dates: 11/16/20 through 11/15/21  Audry Riles, PharmD, BCPS, BCCP, CPP Heart Failure Clinic Pharmacist [phone]

## 2020-12-05 ENCOUNTER — Ambulatory Visit (HOSPITAL_COMMUNITY)
Admission: RE | Admit: 2020-12-05 | Discharge: 2020-12-05 | Disposition: A | Discharge status: Home / Self Care | Payer: Medicare HMO | Source: Ambulatory Visit | Attending: Cardiology | Admitting: Cardiology

## 2020-12-05 ENCOUNTER — Encounter (HOSPITAL_COMMUNITY): Payer: Self-pay | Admitting: Cardiology

## 2020-12-05 ENCOUNTER — Ambulatory Visit (HOSPITAL_BASED_OUTPATIENT_CLINIC_OR_DEPARTMENT_OTHER)
Admission: RE | Admit: 2020-12-05 | Discharge: 2020-12-05 | Disposition: A | Discharge status: Home / Self Care | Payer: Medicare HMO | Source: Ambulatory Visit | Attending: Cardiology | Admitting: Cardiology

## 2020-12-05 ENCOUNTER — Other Ambulatory Visit: Payer: Self-pay

## 2020-12-05 VITALS — BP 140/60 | HR 70 | Wt 123.4 lb

## 2020-12-05 DIAGNOSIS — G4733 Obstructive sleep apnea (adult) (pediatric): Secondary | ICD-10-CM | POA: Diagnosis not present

## 2020-12-05 DIAGNOSIS — Z8673 Personal history of transient ischemic attack (TIA), and cerebral infarction without residual deficits: Secondary | ICD-10-CM | POA: Diagnosis not present

## 2020-12-05 DIAGNOSIS — I5032 Chronic diastolic (congestive) heart failure: Secondary | ICD-10-CM | POA: Diagnosis not present

## 2020-12-05 DIAGNOSIS — E785 Hyperlipidemia, unspecified: Secondary | ICD-10-CM | POA: Insufficient documentation

## 2020-12-05 DIAGNOSIS — Z7982 Long term (current) use of aspirin: Secondary | ICD-10-CM | POA: Diagnosis not present

## 2020-12-05 DIAGNOSIS — I350 Nonrheumatic aortic (valve) stenosis: Secondary | ICD-10-CM | POA: Insufficient documentation

## 2020-12-05 DIAGNOSIS — E1122 Type 2 diabetes mellitus with diabetic chronic kidney disease: Secondary | ICD-10-CM | POA: Diagnosis not present

## 2020-12-05 DIAGNOSIS — N189 Chronic kidney disease, unspecified: Secondary | ICD-10-CM | POA: Insufficient documentation

## 2020-12-05 DIAGNOSIS — Z7951 Long term (current) use of inhaled steroids: Secondary | ICD-10-CM | POA: Insufficient documentation

## 2020-12-05 DIAGNOSIS — J449 Chronic obstructive pulmonary disease, unspecified: Secondary | ICD-10-CM | POA: Diagnosis not present

## 2020-12-05 DIAGNOSIS — Z79899 Other long term (current) drug therapy: Secondary | ICD-10-CM | POA: Insufficient documentation

## 2020-12-05 DIAGNOSIS — Z9981 Dependence on supplemental oxygen: Secondary | ICD-10-CM | POA: Diagnosis not present

## 2020-12-05 DIAGNOSIS — I509 Heart failure, unspecified: Secondary | ICD-10-CM | POA: Diagnosis not present

## 2020-12-05 DIAGNOSIS — Z87891 Personal history of nicotine dependence: Secondary | ICD-10-CM | POA: Insufficient documentation

## 2020-12-05 DIAGNOSIS — I272 Pulmonary hypertension, unspecified: Secondary | ICD-10-CM | POA: Diagnosis not present

## 2020-12-05 DIAGNOSIS — Z7984 Long term (current) use of oral hypoglycemic drugs: Secondary | ICD-10-CM | POA: Insufficient documentation

## 2020-12-05 DIAGNOSIS — I130 Hypertensive heart and chronic kidney disease with heart failure and stage 1 through stage 4 chronic kidney disease, or unspecified chronic kidney disease: Secondary | ICD-10-CM | POA: Insufficient documentation

## 2020-12-05 DIAGNOSIS — I471 Supraventricular tachycardia: Secondary | ICD-10-CM | POA: Diagnosis not present

## 2020-12-05 DIAGNOSIS — Z8249 Family history of ischemic heart disease and other diseases of the circulatory system: Secondary | ICD-10-CM | POA: Diagnosis not present

## 2020-12-05 LAB — BRAIN NATRIURETIC PEPTIDE: B Natriuretic Peptide: 78.5 pg/mL (ref 0.0–100.0)

## 2020-12-05 LAB — BASIC METABOLIC PANEL
Anion gap: 13 (ref 5–15)
BUN: 52 mg/dL — ABNORMAL HIGH (ref 8–23)
CO2: 31 mmol/L (ref 22–32)
Calcium: 9.5 mg/dL (ref 8.9–10.3)
Chloride: 93 mmol/L — ABNORMAL LOW (ref 98–111)
Creatinine, Ser: 1.45 mg/dL — ABNORMAL HIGH (ref 0.44–1.00)
GFR, Estimated: 37 mL/min — ABNORMAL LOW (ref >60)
Glucose, Bld: 110 mg/dL — ABNORMAL HIGH (ref 70–99)
Potassium: 3.7 mmol/L (ref 3.5–5.1)
Sodium: 137 mmol/L (ref 135–145)

## 2020-12-05 LAB — ECHOCARDIOGRAM COMPLETE
AR max vel: 1.88 cm2
AV Area VTI: 1.96 cm2
AV Area mean vel: 2.01 cm2
AV Mean grad: 10 mmHg
AV Peak grad: 21.3 mmHg
Ao pk vel: 2.31 m/s
Area-P 1/2: 2.16 cm2
Radius: 0.3 cm
S' Lateral: 1.9 cm

## 2020-12-05 LAB — MAGNESIUM: Magnesium: 1.9 mg/dL (ref 1.7–2.4)

## 2020-12-05 MED ORDER — BISOPROLOL FUMARATE 10 MG PO TABS: 10.0000 mg | ORAL_TABLET | Freq: Every day | ORAL | 6 refills | Status: DC

## 2020-12-05 MED ORDER — FUROSEMIDE 40 MG PO TABS: ORAL_TABLET | ORAL | 3 refills | Status: DC

## 2020-12-05 NOTE — Progress Notes (Signed)
Date:  12/05/2020   ID:  Beth [name], DOB Jul 27, 1941, MRN [id number]    Provider location: [facility] Type of Visit: Established patient  PCP:  Willey Blade, MD  Cardiologist:  Dr. Aundra Dubin   History of Present Illness: Beth [name] is a 80 y.o. female who has a history of COPD on home oxygen, untreated OSA, chronic diastolic CHF/RV failure, and pulmonary hypertension.  She was admitted in 5/17 with acute on chronic diastolic CHF with prominent RV failure and marked shortness of breath.  She was diuresed with IV Lasix.  Echo showed dilated and dysfunctional RV with pulmonary hypertension.  RHC showed marked pulmonary hypertension with systemic PA pressure.  She was started on Revatio in the hospital and this was titrated up.  Subsequently, she was started on macitentan.  She next started Selexipag but she had intractable side effects with selexipag so stopped it.  I then tried her on Tyvaso, but she has developed abdominal discomfort and diarrhea with Tyvaso and has had to stop it (symptomatic even at lowest dose). She did not feel like it helped her breathing when she was on it.  She stopped sildenafil and started riociguat. However, she developed painful hand swelling on riociguat and has had to stop it.  Symptoms resolved off riociguat.    She was admitted in 8/18 with hypotension and AKI.  Valsartan and HCTZ stopped.     Echo in 11/19 showed EF 60-65%, mild AS, normal RV size and systolic function.    Echo in 12/20 showed EF 65-70%, LV mid-cavity gradient to 65 mmHg, mild AS with mean gradient 14 mmHg, normal RV size and systolic function, IVC normal, no TR jet. Echo was done today and reviewed, EF 65-70%, mild LVH, mid-cavity LV gradient peak 27 mmHg, normal RV size and systolic function, normal IVC, PASP 18 mmHg, mild AS.   She returns for followup of pulmonary hypertension.  Feels good generally.  No dyspnea walking on flat ground as long as she uses  her oxygen.  No chest pain.  No lightheadedness.  She has occasional nocturnal cramps. Weight stable.   ECG (personally reviewed): NSR, RBBB  6 minute walk (7/17): 238 m 6 minute walk (10/17): 171 m 6 minute walk (3/18): 231 m 6 minute walk (11/18): 123 m 6 minute walk (1/19): 213 m 6 minute walk (10/19): 231 m 6 minute walk (9/20): 122 m 6 minute walk (11/20): 213 m 6 minute walk (3/21): 182 m 6 minute walk (9/21): 243 m  Labs (5/17): K 4.7 => 4, creatinine 0.96 => 1.04 Labs (8/17): K 4.1, creatinine 1.5 Labs (9/17): K 3.5, creatinine 1.13 Labs (10/17): K 3.6, creatinine 1.0 Labs (1/18): K 3.9, creatinine 1.14, BNP 34 Labs (2/18): K 4.1, creatinine 1.07, BNP 25 Labs (3/18): K 3.8, creatinine 1.06, BNP 25 Labs (8/18): K 4.6, creatinine 1.38, hgb 9.1 Labs (9/18): BNP 8.7 Labs (11/18): BNP 36, K 3.6, creatinine 0.65 Labs (6/20): K 3.6, creatinine 1.17, BNP 21.6 Labs (9/20): K 3.3, creatinine 0.91, BNP 25 Labs (12/20): K 3.6, creatinine 1.06, BNP 21 Labs (3/21): K 3.6, creatinine 1.27, BNP 31 Labs (9/21): K 4, creatinine 1.29 Labs (11/21): LDL 80, HDL 89  PMH: 1. H/o CVA 2. DM 3. HTN 4. Hyperlipidemia 5. OSA: Cannot tolerate CPAP, has tried multiple masks.  6. COPD: She is on home oxygen. 7. SVT 8. Chronic diastolic CHF with RV failure: Echo (5/17) with EF 55%, severely dilated RV with  moderately decreased systolic function, D-shaped interventricular septum.  - Echo (7/18): EF 60-65%, D-shaped interventricular septum, mild RV dilation with normal systolic function, PASP 41, IVC normal, mild MR, mild AS.  - Echo (11/19): EF 60-65%, mild AS, normal RV size and systolic function.  - Echo (12/20): EF 65-70%, LV mid-cavity gradient to 65 mmHg, mild AS with mean gradient 14 mmHg, normal RV size and systolic function, IVC normal, no TR jet.  - Echo (1/22): EF 65-70%, mild LVH, mid-cavity LV gradient peak 27 mmHg, normal RV size and systolic function, normal IVC, PASP 18 mmHg,  mild AS.   9. 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  V/Q scan 5/17 with no evidence for chronic PE.  ANA weakly positive (1:80), ANCA negative, RF negative.  RHC (5/17) with mean RA 20, PA 110/45 mean 66, mean PCWP 20, CI 1.7, PVR 15 WU.  - Intractable side effects with selexipag.  - Did not tolerate Tyvaso - Did not tolerate riociguat.  10. CKD 11. Aortic stenosis: Mild.   Current Outpatient Medications  Medication Sig Dispense Refill  . albuterol (PROVENTIL) (2.5 MG/3ML) 0.083% nebulizer solution Take 2.5 mg by nebulization every 6 (six) hours as needed for wheezing or shortness of breath.    Marland Kitchen albuterol (VENTOLIN HFA) 108 (90 Base) MCG/ACT inhaler INHALE 2 PUFFS INTO THE LUNGS EVERY 4 HOURS AS NEEDED ONLY IF YOU CANT CATCH YOUR BREATH 18 Inhaler 2  . allopurinol (ZYLOPRIM) 300 MG tablet Take 300 mg by mouth daily.    Marland Kitchen amLODipine (NORVASC) 10 MG tablet TAKE 1 TABLET BY MOUTH EVERYDAY AT BEDTIME 90 tablet 3  . ASPIRIN LOW DOSE 81 MG EC tablet TAKE 1 TABLET BY MOUTH EVERY DAY 90 tablet 3  . atorvastatin (LIPITOR) 80 MG tablet TAKE 1 TABLET BY MOUTH EVERY DAY 90 tablet 3  . b complex vitamins tablet Take 1 tablet by mouth daily.    . Cholecalciferol (VITAMIN D3) 1000 units CAPS Take 1,000 Units by mouth daily.    . clobetasol cream (TEMOVATE) 1.61 % Apply 1 application topically as needed.     Marland Kitchen co-enzyme Q-10 30 MG capsule Take 30 mg by mouth daily.    Marland Kitchen ezetimibe (ZETIA) 10 MG tablet Take 1 tablet (10 mg total) by mouth daily. 30 tablet 11  . Fluticasone-Umeclidin-Vilant (TRELEGY ELLIPTA) 100-62.5-25 MCG/INH AEPB Inhale 1 puff into the lungs daily. 180 each 5  . KLOR-CON M20 20 MEQ tablet TAKE 3 TABLETS (60 MEQ TOTAL) BY MOUTH DAILY. 270 tablet 3  . loratadine (CLARITIN) 10 MG tablet Take 10 mg by mouth as needed.     . macitentan (OPSUMIT) 10 MG tablet Take 1 tablet (10 mg total) by mouth  daily. 30 tablet 11  . meloxicam (MOBIC) 15 MG tablet Take 1 tablet by mouth as needed.    . metFORMIN (GLUCOPHAGE) 500 MG tablet Take 500 mg by mouth 2 (two) times daily with a meal.    . Omega 3 1000 MG CAPS Take 1 capsule by mouth daily.    Marland Kitchen omeprazole (PRILOSEC) 40 MG capsule Take 40 mg by mouth daily.    . OXYGEN Inhale 2 L into the lungs continuous.    . sildenafil (REVATIO) 20 MG tablet TAKE 4 TABLETS (80 MG TOTAL) BY MOUTH 3 (THREE) TIMES DAILY. 1080 tablet 2  . bisoprolol (ZEBETA) 10 MG tablet Take 1 tablet (10 mg total) by mouth daily.  30 tablet 6  . furosemide (LASIX) 40 MG tablet Take 2 tablets (80 mg total) by mouth in the morning AND 1 tablet (40 mg total) every evening. 90 tablet 3   No current facility-administered medications for this encounter.    Allergies:   Aldomet [methyldopa] and Tyvaso [treprostinil]   Social History:  The patient  reports that she quit smoking about 16 years ago. Her smoking use included cigarettes. She has a 60.00 pack-year smoking history. She has quit using smokeless tobacco. She reports that she does not drink alcohol and does not use drugs.   Family History:  The patient's family history includes Heart disease in her brother, brother, mother, sister, and sister; Heart failure in her father and mother.   ROS:  Please see the history of present illness.   All other systems are personally reviewed and negative.   Exam:   BP 140/60   Pulse 70   Wt 56 kg (123 lb 6.4 oz)   SpO2 98%   BMI 22.57 kg/m  General: NAD Neck: No JVD, no thyromegaly or thyroid nodule.  Lungs: Clear to auscultation bilaterally with normal respiratory effort. CV: Nondisplaced PMI.  Heart regular S1/S2, no S3/S4, 2/6 early SEM RUSB.  No peripheral edema.  No carotid bruit.  Normal pedal pulses.  Abdomen: Soft, nontender, no hepatosplenomegaly, no distention.  Skin: Intact without lesions or rashes.  Neurologic: Alert and oriented x 3.  Psych: Normal  affect. Extremities: No clubbing or cyanosis.  HEENT: Normal.    Recent Labs: 10/08/2020: ALT 15 12/05/2020: B Natriuretic Peptide 78.5; BUN 52; Creatinine, Ser 1.45; Magnesium 1.9; Potassium 3.7; Sodium 137  Personally reviewed   Wt Readings from Last 3 Encounters:  12/05/20 56 kg (123 lb 6.4 oz)  07/25/20 54.4 kg (120 lb)  03/15/20 53.1 kg (117 lb)    ASSESSMENT AND PLAN:  1. 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 She was very short of breath with exertion and had very elevated right-sided filling pressures on 5/17 RHC.  PA pressure was systemic. Cardiac output low, but not moving towards IV Flolan given the mixed etiology of her PH. V/Q scan not suggestive of chronic PEs and autoimmune serologies sent (RF negative, ANCA negative, ANA only weakly positive (1:80).She has been unable to tolerate selexipag, Tyvaso, or riociguat. Echo done today showed normal RV size and systolic function and estimation of PA pressure, surprisingly, was not significantly elevated.  NYHA class II.   - 6 minute walk next appt (wore boots today, says they are hard to walk in).  - Continue sildenafil 80 mg tid.  - Continue Opsumit 10 mg daily.  - BNP today.  2. COPD: On home oxygen.  Prior long-time smoker. No change.   3. Chronic diastolic CHF with prominent RV failure: Echo today with RV function improved, appears normal. NYHA class II symptoms. Weight stable. Echo in 12/20 showed a mid-cavity gradient with vigorous LV contraction, today's echo shows a lower mid-cavity gradient.  - Decrease Lasix to 80 qam/40 qpm.  BMET today.    - Increase bisoprolol to 10 mg daily (beta-1 selective beta blocker with COPD) with mid-cavity gradient and HTN.  4. SVT: Denies palpitations.  5. OSA: Has been unable to tolerate CPAP.  She uses oxygen at night.  OSA may have small contribution to [facility] but does not explain the  extent of PAH.  6. Hyperlipidemia - Continue statin, good  lipids 11/21.  7. HTN: Increasing bisoprolol as above.   8. Aortic stenosis: Mild.   Followup in 3 months.   Signed, Loralie Champagne, MD  12/05/2020  [facility] [address] Heart and [place] [address] [phone] (office) [phone] (fax)

## 2020-12-05 NOTE — Patient Instructions (Addendum)
Increase Bisoprolol to 10 mg Daily  Decrease Furosemide to 80 mg in AM and 40 mg in PM, we have sent you in a new prescription for 40 mg Tablets  Labs done today, we will call you for abnormal results  Your physician recommends that you schedule a follow-up appointment in: 3 months  If you have any questions or concerns before your next appointment please send [place] a message through [place] or call our office at [phone].    TO LEAVE A MESSAGE FOR THE NURSE SELECT OPTION 2, PLEASE LEAVE A MESSAGE INCLUDING: . YOUR NAME . DATE OF BIRTH . CALL BACK NUMBER . REASON FOR CALL**this is important as we prioritize the call backs  [place] AS LONG AS YOU CALL BEFORE 4:00 PM  At the [facility], you and your health needs are our priority. As part of our continuing mission to provide you with exceptional heart care, we have created designated Provider Care Teams. These Care Teams include your primary Cardiologist (physician) and Advanced Practice Providers (APPs- Physician Assistants and Nurse Practitioners) who all work together to provide you with the care you need, when you need it.   You may see any of the following providers on your designated Care Team at your next follow up: Marland Kitchen Dr Glori Bickers . Dr Loralie Champagne . Darrick Grinder, NP . Lyda Jester, PA . Audry Riles, PharmD   Please be sure to bring in all your medications bottles to every appointment.

## 2020-12-05 NOTE — Progress Notes (Signed)
  Echocardiogram 2D Echocardiogram has been performed.  Beth [name] 12/05/2020, 11:38 AM

## 2020-12-06 ENCOUNTER — Telehealth (HOSPITAL_COMMUNITY): Payer: Self-pay | Admitting: *Deleted

## 2020-12-06 DIAGNOSIS — I5032 Chronic diastolic (congestive) heart failure: Secondary | ICD-10-CM

## 2020-12-06 DIAGNOSIS — I509 Heart failure, unspecified: Secondary | ICD-10-CM

## 2020-12-06 NOTE — Telephone Encounter (Signed)
-----  Message from Larey Dresser, MD sent at 12/05/2020  4:54 PM EST ----- Decrease Lasix to 80 qam/40 qpm.  BMET 10 days.

## 2020-12-06 NOTE — Telephone Encounter (Signed)
Harvie Junior, [facility]  12/06/2020 11:21 AM EST Back to Top     Pt returned call she is aware and verbalized understanding. Lab appt scheduled.    Shonna Chock, CMA  12/06/2020 9:42 AM EST      [place], CMA  12/05/2020 5:07 PM EST      Lmtrc.    Larey Dresser, MD  12/05/2020 4:54 PM EST      Decrease Lasix to 80 qam/40 qpm. BMET 10 days

## 2020-12-16 ENCOUNTER — Other Ambulatory Visit: Payer: Self-pay

## 2020-12-16 ENCOUNTER — Ambulatory Visit (HOSPITAL_COMMUNITY)
Admission: RE | Admit: 2020-12-16 | Discharge: 2020-12-16 | Disposition: A | Discharge status: Home / Self Care | Payer: Medicare HMO | Source: Ambulatory Visit | Attending: Cardiology | Admitting: Cardiology

## 2020-12-16 DIAGNOSIS — I5032 Chronic diastolic (congestive) heart failure: Secondary | ICD-10-CM | POA: Diagnosis not present

## 2020-12-16 LAB — BASIC METABOLIC PANEL
Anion gap: 12 (ref 5–15)
BUN: 50 mg/dL — ABNORMAL HIGH (ref 8–23)
CO2: 30 mmol/L (ref 22–32)
Calcium: 9.3 mg/dL (ref 8.9–10.3)
Chloride: 98 mmol/L (ref 98–111)
Creatinine, Ser: 1.66 mg/dL — ABNORMAL HIGH (ref 0.44–1.00)
GFR, Estimated: 31 mL/min — ABNORMAL LOW (ref >60)
Glucose, Bld: 118 mg/dL — ABNORMAL HIGH (ref 70–99)
Potassium: 3.6 mmol/L (ref 3.5–5.1)
Sodium: 140 mmol/L (ref 135–145)

## 2020-12-17 ENCOUNTER — Telehealth (HOSPITAL_COMMUNITY): Payer: Self-pay

## 2020-12-17 DIAGNOSIS — I509 Heart failure, unspecified: Secondary | ICD-10-CM

## 2020-12-17 MED ORDER — FUROSEMIDE 40 MG PO TABS: ORAL_TABLET | ORAL | 3 refills | Status: DC

## 2020-12-17 NOTE — Telephone Encounter (Signed)
-----  Message from Larey Dresser, MD sent at 12/16/2020  9:46 AM EST ----- Decrease Lasix to 80 qam, 40 every other afternoon.

## 2021-01-06 ENCOUNTER — Other Ambulatory Visit (HOSPITAL_COMMUNITY): Payer: Self-pay

## 2021-01-06 MED ORDER — BISOPROLOL FUMARATE 10 MG PO TABS: 10.0000 mg | ORAL_TABLET | Freq: Every day | ORAL | 11 refills | Status: DC

## 2021-01-06 NOTE — Telephone Encounter (Signed)
Meds ordered this encounter  Medications  . bisoprolol (ZEBETA) 10 MG tablet    Sig: Take 1 tablet (10 mg total) by mouth daily.    Dispense:  30 tablet    Refill:  11  .

## 2021-01-24 ENCOUNTER — Other Ambulatory Visit (HOSPITAL_COMMUNITY): Payer: Self-pay | Admitting: Cardiology

## 2021-01-24 DIAGNOSIS — I509 Heart failure, unspecified: Secondary | ICD-10-CM

## 2021-01-24 MED ORDER — BISOPROLOL FUMARATE 10 MG PO TABS: 10.0000 mg | ORAL_TABLET | Freq: Every day | ORAL | 11 refills | Status: DC

## 2021-01-24 NOTE — Telephone Encounter (Signed)
I called Beth [name] to clarify her Bisoprolol dosage as the amount requested in refill request differs from what is documented in the last note during her visit with Dr. Aundra Dubin on 12/05/20.  I was unable to leave a message.

## 2021-01-26 ENCOUNTER — Other Ambulatory Visit (HOSPITAL_COMMUNITY): Payer: Self-pay | Admitting: Cardiology

## 2021-02-08 ENCOUNTER — Other Ambulatory Visit (HOSPITAL_COMMUNITY): Payer: Self-pay | Admitting: Cardiology

## 2021-02-11 ENCOUNTER — Telehealth (HOSPITAL_COMMUNITY): Payer: Self-pay | Admitting: Pharmacy Technician

## 2021-02-11 NOTE — Telephone Encounter (Signed)
Patient Advocate Encounter   Received notification from [facility] that prior authorization for Sildenafil (90 days) is required.   PA submitted on CoverMyMeds Key  [id number] Status is pending   Will continue to follow.

## 2021-02-12 NOTE — Telephone Encounter (Signed)
Advanced Heart Failure Patient Advocate Encounter  Prior Authorization for Sildenafil (90 days) has been approved.    PA# [id number] Effective dates: 02/11/21 through 11/15/21  Charlann Boxer, CPhT

## 2021-03-11 ENCOUNTER — Encounter (HOSPITAL_COMMUNITY): Payer: Self-pay | Admitting: Cardiology

## 2021-03-11 ENCOUNTER — Ambulatory Visit (HOSPITAL_COMMUNITY)
Admission: RE | Admit: 2021-03-11 | Discharge: 2021-03-11 | Disposition: A | Discharge status: Home / Self Care | Payer: Medicare HMO | Source: Ambulatory Visit | Attending: Cardiology | Admitting: Cardiology

## 2021-03-11 ENCOUNTER — Other Ambulatory Visit: Payer: Self-pay

## 2021-03-11 VITALS — BP 134/70 | HR 66 | Wt 113.0 lb

## 2021-03-11 DIAGNOSIS — Z79899 Other long term (current) drug therapy: Secondary | ICD-10-CM | POA: Diagnosis not present

## 2021-03-11 DIAGNOSIS — J449 Chronic obstructive pulmonary disease, unspecified: Secondary | ICD-10-CM | POA: Diagnosis not present

## 2021-03-11 DIAGNOSIS — I50810 Right heart failure, unspecified: Secondary | ICD-10-CM | POA: Insufficient documentation

## 2021-03-11 DIAGNOSIS — I4510 Unspecified right bundle-branch block: Secondary | ICD-10-CM | POA: Insufficient documentation

## 2021-03-11 DIAGNOSIS — Z7984 Long term (current) use of oral hypoglycemic drugs: Secondary | ICD-10-CM | POA: Insufficient documentation

## 2021-03-11 DIAGNOSIS — I5032 Chronic diastolic (congestive) heart failure: Secondary | ICD-10-CM | POA: Diagnosis not present

## 2021-03-11 DIAGNOSIS — I130 Hypertensive heart and chronic kidney disease with heart failure and stage 1 through stage 4 chronic kidney disease, or unspecified chronic kidney disease: Secondary | ICD-10-CM | POA: Diagnosis not present

## 2021-03-11 DIAGNOSIS — I350 Nonrheumatic aortic (valve) stenosis: Secondary | ICD-10-CM | POA: Diagnosis not present

## 2021-03-11 DIAGNOSIS — I272 Pulmonary hypertension, unspecified: Secondary | ICD-10-CM | POA: Insufficient documentation

## 2021-03-11 DIAGNOSIS — E785 Hyperlipidemia, unspecified: Secondary | ICD-10-CM | POA: Diagnosis not present

## 2021-03-11 DIAGNOSIS — I471 Supraventricular tachycardia: Secondary | ICD-10-CM | POA: Diagnosis not present

## 2021-03-11 DIAGNOSIS — Z87891 Personal history of nicotine dependence: Secondary | ICD-10-CM | POA: Diagnosis not present

## 2021-03-11 DIAGNOSIS — Z7951 Long term (current) use of inhaled steroids: Secondary | ICD-10-CM | POA: Diagnosis not present

## 2021-03-11 DIAGNOSIS — E1122 Type 2 diabetes mellitus with diabetic chronic kidney disease: Secondary | ICD-10-CM | POA: Insufficient documentation

## 2021-03-11 DIAGNOSIS — Z9981 Dependence on supplemental oxygen: Secondary | ICD-10-CM | POA: Insufficient documentation

## 2021-03-11 DIAGNOSIS — N189 Chronic kidney disease, unspecified: Secondary | ICD-10-CM | POA: Diagnosis not present

## 2021-03-11 DIAGNOSIS — G4733 Obstructive sleep apnea (adult) (pediatric): Secondary | ICD-10-CM | POA: Insufficient documentation

## 2021-03-11 DIAGNOSIS — Z8249 Family history of ischemic heart disease and other diseases of the circulatory system: Secondary | ICD-10-CM | POA: Insufficient documentation

## 2021-03-11 LAB — BRAIN NATRIURETIC PEPTIDE: B Natriuretic Peptide: 23.2 pg/mL (ref 0.0–100.0)

## 2021-03-11 LAB — BASIC METABOLIC PANEL
Anion gap: 10 (ref 5–15)
BUN: 47 mg/dL — ABNORMAL HIGH (ref 8–23)
CO2: 28 mmol/L (ref 22–32)
Calcium: 9.7 mg/dL (ref 8.9–10.3)
Chloride: 98 mmol/L (ref 98–111)
Creatinine, Ser: 1.53 mg/dL — ABNORMAL HIGH (ref 0.44–1.00)
GFR, Estimated: 34 mL/min — ABNORMAL LOW (ref >60)
Glucose, Bld: 130 mg/dL — ABNORMAL HIGH (ref 70–99)
Potassium: 4.4 mmol/L (ref 3.5–5.1)
Sodium: 136 mmol/L (ref 135–145)

## 2021-03-11 NOTE — Patient Instructions (Signed)
EKG done today.  6 minute walk test done today.  Labs done today. We will contact you only if your labs are abnormal.  No medication changes were made. Please continue all current medications as prescribed.  Your provider recommends that you drink ensure or boost daily.  Your physician recommends that you schedule a follow-up appointment in: 4 months  If you have any questions or concerns before your next appointment please send [place] a message through [place] or call our office at [phone].    TO LEAVE A MESSAGE FOR THE NURSE SELECT OPTION 2, PLEASE LEAVE A MESSAGE INCLUDING: . YOUR NAME . DATE OF BIRTH . CALL BACK NUMBER . REASON FOR CALL**this is important as we prioritize the call backs  YOU WILL RECEIVE A CALL BACK THE SAME DAY AS LONG AS YOU CALL BEFORE 4:00 PM   Do the following things EVERYDAY: 1) Weigh yourself in the morning before breakfast. Write it down and keep it in a log. 2) Take your medicines as prescribed 3) Eat low salt foods--Limit salt (sodium) to 2000 mg per day.  4) Stay as active as you can everyday 5) Limit all fluids for the day to less than 2 liters   At the [facility], you and your health needs are our priority. As part of our continuing mission to provide you with exceptional heart care, we have created designated Provider Care Teams. These Care Teams include your primary Cardiologist (physician) and Advanced Practice Providers (APPs- Physician Assistants and Nurse Practitioners) who all work together to provide you with the care you need, when you need it.   You may see any of the following providers on your designated Care Team at your next follow up: Marland Kitchen Dr Glori Bickers . Dr Loralie Champagne . Darrick Grinder, NP . Lyda Jester, PA . Audry Riles, PharmD   Please be sure to bring in all your medications bottles to every appointment.

## 2021-03-11 NOTE — Progress Notes (Signed)
6 Min Walk Test Completed  Pt ambulated 213.6 meters O2 Sat ranged 100%-98 on 2 L oxygen HR ranged 64-76

## 2021-03-12 NOTE — Progress Notes (Signed)
Date:  03/12/2021   ID:  Beth [name], DOB 1941/11/11, MRN [id number]    Provider location: [place] [facility] Type of Visit: Established patient  PCP:  Willey Blade, MD  Cardiologist:  Dr. Aundra Dubin   History of Present Illness: Beth [name] is a 80 y.o. female who has a history of COPD on home oxygen, untreated OSA, chronic diastolic CHF/RV failure, and pulmonary hypertension.  She was admitted in 5/17 with acute on chronic diastolic CHF with prominent RV failure and marked shortness of breath.  She was diuresed with IV Lasix.  Echo showed dilated and dysfunctional RV with pulmonary hypertension.  RHC showed marked pulmonary hypertension with systemic PA pressure.  She was started on Revatio in the hospital and this was titrated up.  Subsequently, she was started on macitentan.  She next started Selexipag but she had intractable side effects with selexipag so stopped it.  I then tried her on Tyvaso, but she has developed abdominal discomfort and diarrhea with Tyvaso and has had to stop it (symptomatic even at lowest dose). She did not feel like it helped her breathing when she was on it.  She stopped sildenafil and started riociguat. However, she developed painful hand swelling on riociguat and has had to stop it.  Symptoms resolved off riociguat.    She was admitted in 8/18 with hypotension and AKI.  Valsartan and HCTZ stopped.     Echo in 11/19 showed EF 60-65%, mild AS, normal RV size and systolic function.    Echo in 12/20 showed EF 65-70%, LV mid-cavity gradient to 65 mmHg, mild AS with mean gradient 14 mmHg, normal RV size and systolic function, IVC normal, no TR jet. Echo was done today and reviewed, EF 65-70%, mild LVH, mid-cavity LV gradient peak 27 mmHg, normal RV size and systolic function, normal IVC, PASP 18 mmHg, mild AS.   She returns for followup of pulmonary hypertension. Says she feels good overall.  Able to do ADLs without dyspnea.  No dyspnea on  flat ground as long as she does not "rush."  Weight down 10 lbs.  No lightheadedness/syncope/palpitations.  No chest pain.   ECG (personally reviewed): NSR, RBBB  6 minute walk (7/17): 238 m 6 minute walk (10/17): 171 m 6 minute walk (3/18): 231 m 6 minute walk (11/18): 123 m 6 minute walk (1/19): 213 m 6 minute walk (10/19): 231 m 6 minute walk (9/20): 122 m 6 minute walk (11/20): 213 m 6 minute walk (3/21): 182 m 6 minute walk (9/21): 243 m 6 minute walk (4/22): 213 m  Labs (5/17): K 4.7 => 4, creatinine 0.96 => 1.04 Labs (8/17): K 4.1, creatinine 1.5 Labs (9/17): K 3.5, creatinine 1.13 Labs (10/17): K 3.6, creatinine 1.0 Labs (1/18): K 3.9, creatinine 1.14, BNP 34 Labs (2/18): K 4.1, creatinine 1.07, BNP 25 Labs (3/18): K 3.8, creatinine 1.06, BNP 25 Labs (8/18): K 4.6, creatinine 1.38, hgb 9.1 Labs (9/18): BNP 8.7 Labs (11/18): BNP 36, K 3.6, creatinine 0.65 Labs (6/20): K 3.6, creatinine 1.17, BNP 21.6 Labs (9/20): K 3.3, creatinine 0.91, BNP 25 Labs (12/20): K 3.6, creatinine 1.06, BNP 21 Labs (3/21): K 3.6, creatinine 1.27, BNP 31 Labs (9/21): K 4, creatinine 1.29 Labs (11/21): LDL 80, HDL 89 Labs (1/22): K 3.6, creatinine 1.66, BNP 78.5  PMH: 1. H/o CVA 2. DM 3. HTN 4. Hyperlipidemia 5. OSA: Cannot tolerate CPAP, has tried multiple masks.  6. COPD: She is on home oxygen.  7. SVT 8. Chronic diastolic CHF with RV failure: Echo (5/17) with EF 55%, severely dilated RV with moderately decreased systolic function, D-shaped interventricular septum.  - Echo (7/18): EF 60-65%, D-shaped interventricular septum, mild RV dilation with normal systolic function, PASP 41, IVC normal, mild MR, mild AS.  - Echo (11/19): EF 60-65%, mild AS, normal RV size and systolic function.  - Echo (12/20): EF 65-70%, LV mid-cavity gradient to 65 mmHg, mild AS with mean gradient 14 mmHg, normal RV size and systolic function, IVC normal, no TR jet.  - Echo (1/22): EF 65-70%, mild LVH,  mid-cavity LV gradient peak 27 mmHg, normal RV size and systolic function, normal IVC, PASP 18 mmHg, mild AS.   9. 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  V/Q scan 5/17 with no evidence for chronic PE.  ANA weakly positive (1:80), ANCA negative, RF negative.  RHC (5/17) with mean RA 20, PA 110/45 mean 66, mean PCWP 20, CI 1.7, PVR 15 WU.  - Intractable side effects with selexipag.  - Did not tolerate Tyvaso - Did not tolerate riociguat.  10. CKD 11. Aortic stenosis: Mild.   Current Outpatient Medications  Medication Sig Dispense Refill  . albuterol (PROVENTIL) (2.5 MG/3ML) 0.083% nebulizer solution Take 2.5 mg by nebulization every 6 (six) hours as needed for wheezing or shortness of breath.    Marland Kitchen albuterol (VENTOLIN HFA) 108 (90 Base) MCG/ACT inhaler INHALE 2 PUFFS INTO THE LUNGS EVERY 4 HOURS AS NEEDED ONLY IF YOU CANT CATCH YOUR BREATH 18 Inhaler 2  . allopurinol (ZYLOPRIM) 300 MG tablet Take 300 mg by mouth daily.    Marland Kitchen amLODipine (NORVASC) 10 MG tablet TAKE 1 TABLET BY MOUTH EVERYDAY AT BEDTIME 90 tablet 3  . ASPIRIN LOW DOSE 81 MG EC tablet TAKE 1 TABLET BY MOUTH EVERY DAY 90 tablet 3  . atorvastatin (LIPITOR) 80 MG tablet TAKE 1 TABLET BY MOUTH EVERY DAY 90 tablet 3  . b complex vitamins tablet Take 1 tablet by mouth daily.    . bisoprolol (ZEBETA) 10 MG tablet Take 1 tablet (10 mg total) by mouth daily. 30 tablet 11  . Cholecalciferol (VITAMIN D3) 1000 units CAPS Take 1,000 Units by mouth daily.    . clobetasol cream (TEMOVATE) 7.82 % Apply 1 application topically as needed.     Marland Kitchen co-enzyme Q-10 30 MG capsule Take 30 mg by mouth daily.    Marland Kitchen ezetimibe (ZETIA) 10 MG tablet Take 1 tablet (10 mg total) by mouth daily. 30 tablet 11  . Fluticasone-Umeclidin-Vilant (TRELEGY ELLIPTA) 100-62.5-25 MCG/INH AEPB Inhale 1 puff into the lungs daily. 180 each 5  . furosemide (LASIX) 40 MG tablet TAKE 2  TABLETS BY MOUTH EVERY MORNING AND 1 TABLET BY MOUTH EVERY OTHER EVENING 270 tablet 3  . KLOR-CON M20 20 MEQ tablet TAKE 3 TABLETS (60 MEQ TOTAL) BY MOUTH DAILY. 270 tablet 3  . loratadine (CLARITIN) 10 MG tablet Take 10 mg by mouth as needed.     . macitentan (OPSUMIT) 10 MG tablet Take 1 tablet (10 mg total) by mouth daily. 30 tablet 11  . meloxicam (MOBIC) 15 MG tablet Take 1 tablet by mouth as needed.    . metFORMIN (GLUCOPHAGE) 500 MG tablet Take 500 mg by mouth 2 (two) times daily with a meal.    . Omega 3 1000 MG CAPS Take 1 capsule by mouth daily.    Marland Kitchen omeprazole (  PRILOSEC) 40 MG capsule Take 40 mg by mouth daily.    . OXYGEN Inhale 2 L into the lungs continuous.    . sildenafil (REVATIO) 20 MG tablet TAKE 4 TABLETS (80 MG TOTAL) BY MOUTH 3 (THREE) TIMES DAILY. 1080 tablet 3   No current facility-administered medications for this encounter.    Allergies:   Aldomet [methyldopa] and Tyvaso [treprostinil]   Social History:  The patient  reports that she quit smoking about 16 years ago. Her smoking use included cigarettes. She has a 60.00 pack-year smoking history. She has quit using smokeless tobacco. She reports that she does not drink alcohol and does not use drugs.   Family History:  The patient's family history includes Heart disease in her brother, brother, mother, sister, and sister; Heart failure in her father and mother.   ROS:  Please see the history of present illness.   All other systems are personally reviewed and negative.   Exam:   BP 134/70   Pulse 66   Wt 51.3 kg (113 lb)   SpO2 100% Comment: 2 l n/c  BMI 20.67 kg/m  General: NAD Neck: No JVD, no thyromegaly or thyroid nodule.  Lungs: Decreased BS bilaterally.  CV: Nondisplaced PMI.  Heart regular S1/S2, no S3/S4, 1/6 SEM RUSB.  No peripheral edema.  No carotid bruit.  Normal pedal pulses.  Abdomen: Soft, nontender, no hepatosplenomegaly, no distention.  Skin: Intact without lesions or rashes.  Neurologic:  Alert and oriented x 3.  Psych: Normal affect. Extremities: No clubbing or cyanosis.  HEENT: Normal.   Recent Labs: 10/08/2020: ALT 15 12/05/2020: Magnesium 1.9 03/11/2021: B Natriuretic Peptide 23.2; BUN 47; Creatinine, Ser 1.53; Potassium 4.4; Sodium 136  Personally reviewed   Wt Readings from Last 3 Encounters:  03/11/21 51.3 kg (113 lb)  12/05/20 56 kg (123 lb 6.4 oz)  07/25/20 54.4 kg (120 lb)    ASSESSMENT AND PLAN:  1. 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 She was very short of breath with exertion and had very elevated right-sided filling pressures on 5/17 RHC.  PA pressure was systemic. Cardiac output low, but not moving towards IV Flolan given the mixed etiology of her PH. V/Q scan not suggestive of chronic PEs and autoimmune serologies sent (RF negative, ANCA negative, ANA only weakly positive (1:80).She has been unable to tolerate selexipag, Tyvaso, or riociguat. Echo in 1/22 showed normal RV size and systolic function and estimation of PA pressure, surprisingly, was not significantly elevated.  NYHA class II.  6 minute walk fairly stable.  - Continue sildenafil 80 mg tid.  - Continue Opsumit 10 mg daily.  - BNP today.  2. COPD: On home oxygen.  Prior long-time smoker. No change.   3. Chronic diastolic CHF with prominent RV failure: Echo in 1/22 with RV function improved, appears normal. NYHA class II symptoms. Weight stable. Echo in 12/20 showed a mid-cavity gradient with vigorous LV contraction, 1/22 echo showed a lower mid-cavity gradient.  - Continue Lasix 80 qam, 40 every other pm.     - Continue bisoprolol 10 mg daily (beta-1 selective beta blocker with COPD) with mid-cavity gradient and HTN.  4. SVT: Denies palpitations.  5. OSA: Has been unable to tolerate CPAP.  She uses oxygen at night.  OSA may have small contribution to [facility] but does not explain the extent of  PAH.  6. Hyperlipidemia - Continue statin, good lipids 11/21.  7. HTN: BP controlled.    8. Aortic stenosis: Mild.   Followup in 3 months.   Signed, Loralie Champagne, MD  03/12/2021  [facility] [address] [facility] and [place] [address] [phone] (office) [phone] (fax)

## 2021-03-17 ENCOUNTER — Ambulatory Visit: Payer: Medicare HMO | Admitting: Adult Health

## 2021-03-17 ENCOUNTER — Ambulatory Visit: Payer: Medicare HMO | Admitting: Internal Medicine

## 2021-03-18 ENCOUNTER — Ambulatory Visit (INDEPENDENT_AMBULATORY_CARE_PROVIDER_SITE_OTHER): Payer: Medicare HMO | Admitting: Adult Health

## 2021-03-18 ENCOUNTER — Other Ambulatory Visit: Payer: Self-pay

## 2021-03-18 ENCOUNTER — Encounter: Payer: Self-pay | Admitting: Adult Health

## 2021-03-18 ENCOUNTER — Ambulatory Visit (INDEPENDENT_AMBULATORY_CARE_PROVIDER_SITE_OTHER): Payer: Medicare HMO

## 2021-03-18 VITALS — BP 128/60 | HR 89 | Temp 98.5°F | Ht 62.0 in | Wt 114.0 lb

## 2021-03-18 DIAGNOSIS — J9611 Chronic respiratory failure with hypoxia: Secondary | ICD-10-CM

## 2021-03-18 DIAGNOSIS — I272 Pulmonary hypertension, unspecified: Secondary | ICD-10-CM

## 2021-03-18 DIAGNOSIS — J449 Chronic obstructive pulmonary disease, unspecified: Secondary | ICD-10-CM

## 2021-03-18 DIAGNOSIS — J9612 Chronic respiratory failure with hypercapnia: Secondary | ICD-10-CM

## 2021-03-18 NOTE — Assessment & Plan Note (Signed)
Continue on oxygen 2 L.  To maintain O2 saturations greater than 88 to 90%.

## 2021-03-18 NOTE — Assessment & Plan Note (Signed)
Continue on current regimen currently at baseline

## 2021-03-18 NOTE — Progress Notes (Signed)
_0  ID: Beth [name], female    DOB: 03-Jun-1941, 80 y.o.   MRN: [id number]  Chief Complaint  Patient presents with  . Follow-up    Referring provider: Willey Blade, MD  HPI: 80 year old female former smoker followed for gold 3 COPD and oxygen dependent respiratory failure Medical history significant for pulmonary hypertension  TEST/EVENTS :  PFT's 06/30/2012 FEV1  0.93 (49%) ratio 49 and DLCO 47 corrects to 87   - 07/16/2017   > trial of trelegy  - Spirometry 07/16/2017  FEV1 0.57 (38%)  Ratio 54 with atypical f/v with no rx prior  - 01/02/2019  After extensive coaching inhaler device,  effectiveness =    90% with elipta vs 75% baseline   03/18/2021 Follow up : COPD and O2 RF  Patient returns for a 1 year follow-up.  Patient has underlying severe COPD.  Patient says overall breathing has been doing okay.  She is had no flare of cough or wheezing.  Says her shortness of breath is at baseline.  She gets winded with heavy activity.  Is not able to walk for prolonged periods of time.  She denies any hospitalizations or emergency room visits for her breathing.  She remains on Trelegy inhaler daily. She is on oxygen 2 L.  Says that she is doing well on this.  She denies any increased oxygen demands.  Patient is followed by cardiology for pulmonary hypertension felt to be mixed group 3 (COPD, untreated sleep apnea) and Group 1 pulmonary hypertension.  Work-up revealed unrevealing autoimmune serologies with negative rheumatoid factor, ANCA.  ANA only weakly positive).  She was intolerant to Tyvaso,Selexipag, and riociguat.  She has been doing well on sildenafil and Opsumit.  Most recent echo January 2022 showed normal RV size and systolic function.  An estimation of pulmonary artery pressure was normal.    Allergies  Allergen Reactions  . Aldomet [Methyldopa] Hives  . Tyvaso [Treprostinil] Diarrhea    Immunization History  Administered Date(s) Administered  . Influenza Split  08/17/2011  . Influenza Whole 11/17/2012  . Influenza, High Dose Seasonal PF 08/16/2016, 08/25/2017, 09/17/2019  . PFIZER(Purple Top)SARS-COV-2 Vaccination 04/20/2020, 05/11/2020  . Pneumococcal Polysaccharide-23 08/17/2011  . Tdap 03/07/2020    Past Medical History:  Diagnosis Date  . Arthritis   . Asthma   . CHF (congestive heart failure) ([place])   . CKD (chronic kidney disease)   . COPD (chronic obstructive pulmonary disease) ([place])    dR. wERT  . Diabetes mellitus ([place])    type 2  . Gout   . Hyperlipidemia   . Hypertension   . Shortness of breath    WITH EXERTION   . Sleep apnea    USES  CPAP   . Stroke [facility])     Tobacco History: Social History   Tobacco Use  Smoking Status Former Smoker  . Packs/day: 1.50  . Years: 40.00  . Pack years: 60.00  . Types: Cigarettes  . Quit date: 11/16/2004  . Years since quitting: 16.3  Smokeless Tobacco Former Air traffic controller given: Not Answered   Outpatient Medications Prior to Visit  Medication Sig Dispense Refill  . albuterol (PROVENTIL) (2.5 MG/3ML) 0.083% nebulizer solution Take 2.5 mg by nebulization every 6 (six) hours as needed for wheezing or shortness of breath.    Marland Kitchen albuterol (VENTOLIN HFA) 108 (90 Base) MCG/ACT inhaler INHALE 2 PUFFS INTO THE LUNGS EVERY 4 HOURS AS NEEDED ONLY IF YOU CANT CATCH YOUR BREATH 18 Inhaler 2  .  allopurinol (ZYLOPRIM) 300 MG tablet Take 300 mg by mouth daily.    Marland Kitchen amLODipine (NORVASC) 10 MG tablet TAKE 1 TABLET BY MOUTH EVERYDAY AT BEDTIME 90 tablet 3  . ASPIRIN LOW DOSE 81 MG EC tablet TAKE 1 TABLET BY MOUTH EVERY DAY 90 tablet 3  . atorvastatin (LIPITOR) 80 MG tablet TAKE 1 TABLET BY MOUTH EVERY DAY 90 tablet 3  . b complex vitamins tablet Take 1 tablet by mouth daily.    . bisoprolol (ZEBETA) 10 MG tablet Take 1 tablet (10 mg total) by mouth daily. 30 tablet 11  . Cholecalciferol (VITAMIN D3) 1000 units CAPS Take 1,000 Units by mouth daily.    . clobetasol cream (TEMOVATE) 4.09 % Apply 1  application topically as needed.     Marland Kitchen co-enzyme Q-10 30 MG capsule Take 30 mg by mouth daily.    Marland Kitchen ezetimibe (ZETIA) 10 MG tablet Take 1 tablet (10 mg total) by mouth daily. 30 tablet 11  . Fluticasone-Umeclidin-Vilant (TRELEGY ELLIPTA) 100-62.5-25 MCG/INH AEPB Inhale 1 puff into the lungs daily. 180 each 5  . furosemide (LASIX) 40 MG tablet TAKE 2 TABLETS BY MOUTH EVERY MORNING AND 1 TABLET BY MOUTH EVERY OTHER EVENING 270 tablet 3  . KLOR-CON M20 20 MEQ tablet TAKE 3 TABLETS (60 MEQ TOTAL) BY MOUTH DAILY. 270 tablet 3  . loratadine (CLARITIN) 10 MG tablet Take 10 mg by mouth as needed.     . macitentan (OPSUMIT) 10 MG tablet Take 1 tablet (10 mg total) by mouth daily. 30 tablet 11  . meloxicam (MOBIC) 15 MG tablet Take 1 tablet by mouth as needed.    . metFORMIN (GLUCOPHAGE) 500 MG tablet Take 500 mg by mouth 2 (two) times daily with a meal.    . Omega 3 1000 MG CAPS Take 1 capsule by mouth daily.    Marland Kitchen omeprazole (PRILOSEC) 40 MG capsule Take 40 mg by mouth daily.    . OXYGEN Inhale 2 L into the lungs continuous.    . sildenafil (REVATIO) 20 MG tablet TAKE 4 TABLETS (80 MG TOTAL) BY MOUTH 3 (THREE) TIMES DAILY. 1080 tablet 3   No facility-administered medications prior to visit.     Review of Systems:   Constitutional:   No  weight loss, night sweats,  Fevers, chills, + fatigue, or  lassitude.  HEENT:   No headaches,  Difficulty swallowing,  Tooth/dental problems, or  Sore throat,                No sneezing, itching, ear ache, nasal congestion, post nasal drip,   CV:  No chest pain,  Orthopnea, PND, swelling in lower extremities, anasarca, dizziness, palpitations, syncope.   GI  No heartburn, indigestion, abdominal pain, nausea, vomiting, diarrhea, change in bowel habits, loss of appetite, bloody stools.   Resp:    No chest wall deformity  Skin: no rash or lesions.  GU: no dysuria, change in color of urine, no urgency or frequency.  No flank pain, no hematuria   MS:  No joint  pain or swelling.  No decreased range of motion.  No back pain.    Physical Exam  BP 128/60 (BP Location: Left Arm, Patient Position: Sitting, Cuff Size: Normal)   Pulse 89   Temp 98.5 F (36.9 C) (Temporal)   Ht 5' 2" (1.575 m)   Wt 114 lb (51.7 kg)   SpO2 100%   BMI 20.85 kg/m   GEN: A/Ox3; pleasant , NAD, elderly on oxygen  HEENT:  [place]/AT,   NOSE-clear, THROAT-clear, no lesions, no postnasal drip or exudate noted.   NECK:  Supple w/ fair ROM; no JVD; normal carotid impulses w/o bruits; no thyromegaly or nodules palpated; no lymphadenopathy.    RESP  Clear  P & A; w/o, wheezes/ rales/ or rhonchi. no accessory muscle use, no dullness to percussion  CARD:  RRR, no m/r/g, tr  peripheral edema, pulses intact, no cyanosis or clubbing.  GI:   Soft & nt; nml bowel sounds; no organomegaly or masses detected.   Musco: Warm bil, no deformities or joint swelling noted.   Neuro: alert, no focal deficits noted.    Skin: Warm, no lesions or rashes    Lab Results:  CBC  BMET   ProBNP  Imaging: No results found.    No flowsheet data found.  No results found for: NITRICOXIDE      Assessment & Plan:   COPD  GOLD III/ 02 dep  Continue on current regimen currently at baseline  Chronic respiratory failure with hypoxia and hypercapnia (HCC) Continue on oxygen 2 L.  To maintain O2 saturations greater than 88 to 90%.  Pulmonary hypertension ([place]) Continue follow-up with cardiology.  Doing well on current regimen     Rexene Edison, NP 03/18/2021

## 2021-03-18 NOTE — Patient Instructions (Addendum)
Continue on TRELEGY 1 PUFF DAILY . Rinse after use.  Chest xray today .  Albuterol inhaler As needed   Continue on Oxygen 2l/m .  Follow up with Dr. Melvyn Novas  In 6  months and As needed  .

## 2021-03-18 NOTE — Assessment & Plan Note (Signed)
Continue follow-up with cardiology.  Doing well on current regimen

## 2021-03-26 ENCOUNTER — Encounter: Payer: Self-pay | Admitting: *Deleted

## 2021-03-26 NOTE — Progress Notes (Signed)
Unable to reach letter sent per policy.

## 2021-03-26 NOTE — Progress Notes (Signed)
ATC x2, no answer on cell # and VM is full.

## 2021-03-31 ENCOUNTER — Telehealth: Payer: Self-pay | Admitting: Adult Health

## 2021-03-31 NOTE — Telephone Encounter (Signed)
Chest x-ray shows COPD emphysematous changes. Continue with office visit recommendations and follow-up

## 2021-03-31 NOTE — Telephone Encounter (Signed)
Called and spoke with patient. She verbalized understanding of CXR results. Nothing further needed at time of call.

## 2021-04-23 ENCOUNTER — Other Ambulatory Visit (HOSPITAL_COMMUNITY): Payer: Self-pay | Admitting: Cardiology

## 2021-07-09 ENCOUNTER — Other Ambulatory Visit: Payer: Self-pay | Admitting: Internal Medicine

## 2021-07-11 ENCOUNTER — Other Ambulatory Visit: Payer: Self-pay

## 2021-07-11 ENCOUNTER — Encounter (HOSPITAL_COMMUNITY): Payer: Self-pay | Admitting: Cardiology

## 2021-07-11 ENCOUNTER — Ambulatory Visit (HOSPITAL_COMMUNITY)
Admission: RE | Admit: 2021-07-11 | Discharge: 2021-07-11 | Disposition: A | Discharge status: Home / Self Care | Payer: Medicare HMO | Source: Ambulatory Visit | Attending: Cardiology | Admitting: Cardiology

## 2021-07-11 VITALS — BP 140/70 | HR 68 | Wt 114.0 lb

## 2021-07-11 DIAGNOSIS — I50810 Right heart failure, unspecified: Secondary | ICD-10-CM | POA: Diagnosis not present

## 2021-07-11 DIAGNOSIS — E1122 Type 2 diabetes mellitus with diabetic chronic kidney disease: Secondary | ICD-10-CM | POA: Insufficient documentation

## 2021-07-11 DIAGNOSIS — Z7984 Long term (current) use of oral hypoglycemic drugs: Secondary | ICD-10-CM | POA: Insufficient documentation

## 2021-07-11 DIAGNOSIS — I5032 Chronic diastolic (congestive) heart failure: Secondary | ICD-10-CM | POA: Diagnosis not present

## 2021-07-11 DIAGNOSIS — Z8673 Personal history of transient ischemic attack (TIA), and cerebral infarction without residual deficits: Secondary | ICD-10-CM | POA: Diagnosis not present

## 2021-07-11 DIAGNOSIS — I4510 Unspecified right bundle-branch block: Secondary | ICD-10-CM | POA: Diagnosis not present

## 2021-07-11 DIAGNOSIS — Z7982 Long term (current) use of aspirin: Secondary | ICD-10-CM | POA: Insufficient documentation

## 2021-07-11 DIAGNOSIS — Z9981 Dependence on supplemental oxygen: Secondary | ICD-10-CM | POA: Insufficient documentation

## 2021-07-11 DIAGNOSIS — Z8249 Family history of ischemic heart disease and other diseases of the circulatory system: Secondary | ICD-10-CM | POA: Diagnosis not present

## 2021-07-11 DIAGNOSIS — J449 Chronic obstructive pulmonary disease, unspecified: Secondary | ICD-10-CM | POA: Insufficient documentation

## 2021-07-11 DIAGNOSIS — I350 Nonrheumatic aortic (valve) stenosis: Secondary | ICD-10-CM | POA: Insufficient documentation

## 2021-07-11 DIAGNOSIS — N189 Chronic kidney disease, unspecified: Secondary | ICD-10-CM | POA: Diagnosis not present

## 2021-07-11 DIAGNOSIS — I471 Supraventricular tachycardia: Secondary | ICD-10-CM | POA: Insufficient documentation

## 2021-07-11 DIAGNOSIS — Z87891 Personal history of nicotine dependence: Secondary | ICD-10-CM | POA: Diagnosis not present

## 2021-07-11 DIAGNOSIS — Z9119 Patient's noncompliance with other medical treatment and regimen: Secondary | ICD-10-CM | POA: Diagnosis not present

## 2021-07-11 DIAGNOSIS — I272 Pulmonary hypertension, unspecified: Secondary | ICD-10-CM

## 2021-07-11 DIAGNOSIS — I130 Hypertensive heart and chronic kidney disease with heart failure and stage 1 through stage 4 chronic kidney disease, or unspecified chronic kidney disease: Secondary | ICD-10-CM | POA: Diagnosis not present

## 2021-07-11 DIAGNOSIS — G4733 Obstructive sleep apnea (adult) (pediatric): Secondary | ICD-10-CM | POA: Diagnosis not present

## 2021-07-11 DIAGNOSIS — E785 Hyperlipidemia, unspecified: Secondary | ICD-10-CM | POA: Diagnosis not present

## 2021-07-11 DIAGNOSIS — Z79899 Other long term (current) drug therapy: Secondary | ICD-10-CM | POA: Insufficient documentation

## 2021-07-11 DIAGNOSIS — Z7951 Long term (current) use of inhaled steroids: Secondary | ICD-10-CM | POA: Insufficient documentation

## 2021-07-11 LAB — BRAIN NATRIURETIC PEPTIDE: B Natriuretic Peptide: 26.7 pg/mL (ref 0.0–100.0)

## 2021-07-11 LAB — BASIC METABOLIC PANEL
Anion gap: 7 (ref 5–15)
BUN: 55 mg/dL — ABNORMAL HIGH (ref 8–23)
CO2: 32 mmol/L (ref 22–32)
Calcium: 9.8 mg/dL (ref 8.9–10.3)
Chloride: 98 mmol/L (ref 98–111)
Creatinine, Ser: 1.26 mg/dL — ABNORMAL HIGH (ref 0.44–1.00)
GFR, Estimated: 43 mL/min — ABNORMAL LOW (ref >60)
Glucose, Bld: 139 mg/dL — ABNORMAL HIGH (ref 70–99)
Potassium: 3.6 mmol/L (ref 3.5–5.1)
Sodium: 137 mmol/L (ref 135–145)

## 2021-07-11 NOTE — Progress Notes (Signed)
6 Min Walk Test Completed  Pt ambulated 304.8 feet O2 Sat ranged 88-98% on 2L oxygen HR ranged 65-93

## 2021-07-11 NOTE — Patient Instructions (Signed)
EKG done today.  6 minute walk test done today.   Labs done today. We will contact you only if your labs are abnormal.  No other medication changes were made. Please continue all current medications as prescribed.  Your physician recommends that you schedule a follow-up appointment in: 4 months  If you have any questions or concerns before your next appointment please send [place] a message through [place] or call our office at [phone].    TO LEAVE A MESSAGE FOR THE NURSE SELECT OPTION 2, PLEASE LEAVE A MESSAGE INCLUDING: YOUR NAME DATE OF BIRTH CALL BACK NUMBER REASON FOR CALL**this is important as we prioritize the call backs  YOU WILL RECEIVE A CALL BACK THE SAME DAY AS LONG AS YOU CALL BEFORE 4:00 PM   Do the following things EVERYDAY: Weigh yourself in the morning before breakfast. Write it down and keep it in a log. Take your medicines as prescribed Eat low salt foods--Limit salt (sodium) to 2000 mg per day.  Stay as active as you can everyday Limit all fluids for the day to less than 2 liters   At the [facility], you and your health needs are our priority. As part of our continuing mission to provide you with exceptional heart care, we have created designated Provider Care Teams. These Care Teams include your primary Cardiologist (physician) and Advanced Practice Providers (APPs- Physician Assistants and Nurse Practitioners) who all work together to provide you with the care you need, when you need it.   You may see any of the following providers on your designated Care Team at your next follow up: Dr Glori Bickers Dr Haynes Kerns, NP Lyda Jester, [place] Audry Riles, PharmD   Please be sure to bring in all your medications bottles to every appointment.

## 2021-07-11 NOTE — Progress Notes (Signed)
Date:  07/11/2021   ID:  Beth [name], DOB [birth year], MRN [id number]    Provider location: [facility] Type of Visit: Established patient  PCP:  Willey Blade, MD  Cardiologist:  Dr. Aundra Dubin   History of Present Illness: Beth [name] is a 80 y.o. female who has a history of COPD on home oxygen, untreated OSA, chronic diastolic CHF/RV failure, and pulmonary hypertension.  She was admitted in 5/17 with acute on chronic diastolic CHF with prominent RV failure and marked shortness of breath.  She was diuresed with IV Lasix.  Echo showed dilated and dysfunctional RV with pulmonary hypertension.  RHC showed marked pulmonary hypertension with systemic PA pressure.  She was started on Revatio in the hospital and this was titrated up.  Subsequently, she was started on macitentan.  She next started Selexipag but she had intractable side effects with selexipag so stopped it.  I then tried her on Tyvaso, but she has developed abdominal discomfort and diarrhea with Tyvaso and has had to stop it (symptomatic even at lowest dose). She did not feel like it helped her breathing when she was on it.  She stopped sildenafil and started riociguat. However, she developed painful hand swelling on riociguat and has had to stop it.  Symptoms resolved off riociguat.     She was admitted in 8/18 with hypotension and AKI.  Valsartan and HCTZ stopped.      Echo in 11/19 showed EF 60-65%, mild AS, normal RV size and systolic function.    Echo in 12/20 showed EF 65-70%, LV mid-cavity gradient to 65 mmHg, mild AS with mean gradient 14 mmHg, normal RV size and systolic function, IVC normal, no TR jet. Echo in 1/22 showed EF 65-70%, mild LVH, mid-cavity LV gradient peak 27 mmHg, normal RV size and systolic function, normal IVC, PASP 18 mmHg, mild AS.   She returns for followup of pulmonary hypertension.  Weight is stable.  She wears 2 L [place] at all times, not able to tolerate CPAP.  She is doing  well in general, no dyspnea walking on flat ground or walking up stairs. No chest pain.  No lightheadedness or syncope.   ECG (personally reviewed): NSR, RBBB   6 minute walk (7/17): 238 m 6 minute walk (10/17): 171 m 6 minute walk (3/18): 231 m 6 minute walk (11/18): 123 m 6 minute walk (1/19): 213 m 6 minute walk (10/19): 231 m 6 minute walk (9/20): 122 m 6 minute walk (11/20): 213 m 6 minute walk (3/21): 182 m 6 minute walk (9/21): 243 m 6 minute walk (4/22): 213 m   Labs (5/17): K 4.7 => 4, creatinine 0.96 => 1.04 Labs (8/17): K 4.1, creatinine 1.5 Labs (9/17): K 3.5, creatinine 1.13 Labs (10/17): K 3.6, creatinine 1.0 Labs (1/18): K 3.9, creatinine 1.14, BNP 34 Labs (2/18): K 4.1, creatinine 1.07, BNP 25 Labs (3/18): K 3.8, creatinine 1.06, BNP 25 Labs (8/18): K 4.6, creatinine 1.38, hgb 9.1 Labs (9/18): BNP 8.7 Labs (11/18): BNP 36, K 3.6, creatinine 0.65 Labs (6/20): K 3.6, creatinine 1.17, BNP 21.6 Labs (9/20): K 3.3, creatinine 0.91, BNP 25 Labs (12/20): K 3.6, creatinine 1.06, BNP 21 Labs (3/21): K 3.6, creatinine 1.27, BNP 31 Labs (9/21): K 4, creatinine 1.29 Labs (11/21): LDL 80, HDL 89 Labs (1/22): K 3.6, creatinine 1.66, BNP 78.5 Labs (4/22): BNP 23, K 4.4, creatinine 1.53   PMH: 1. H/o CVA 2. DM 3. HTN 4. Hyperlipidemia  5. OSA: Cannot tolerate CPAP, has tried multiple masks.  6. COPD: She is on home oxygen. 7. SVT 8. Chronic diastolic CHF with RV failure: Echo (5/17) with EF 55%, severely dilated RV with moderately decreased systolic function, D-shaped interventricular septum.  - Echo (7/18): EF 60-65%, D-shaped interventricular septum, mild RV dilation with normal systolic function, PASP 41, IVC normal, mild MR, mild AS.  - Echo (11/19): EF 60-65%, mild AS, normal RV size and systolic function.  - Echo (12/20): EF 65-70%, LV mid-cavity gradient to 65 mmHg, mild AS with mean gradient 14 mmHg, normal RV size and systolic function, IVC normal, no TR jet.  -  Echo (1/22): EF 65-70%, mild LVH, mid-cavity LV gradient peak 27 mmHg, normal RV size and systolic function, normal IVC, PASP 18 mmHg, mild AS.   9. 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  V/Q scan 5/17 with no evidence for chronic PE.  ANA weakly positive (1:80), ANCA negative, RF negative.  RHC (5/17) with mean RA 20, PA 110/45 mean 66, mean PCWP 20, CI 1.7, PVR 15 WU.  - Intractable side effects with selexipag.  - Did not tolerate Tyvaso - Did not tolerate riociguat.  10. CKD 11. Aortic stenosis: Mild.   Current Outpatient Medications  Medication Sig Dispense Refill   albuterol (PROVENTIL) (2.5 MG/3ML) 0.083% nebulizer solution Take 2.5 mg by nebulization every 6 (six) hours as needed for wheezing or shortness of breath.     albuterol (VENTOLIN HFA) 108 (90 Base) MCG/ACT inhaler INHALE 2 PUFFS INTO THE LUNGS EVERY 4 HOURS AS NEEDED ONLY IF YOU CANT CATCH YOUR BREATH 18 Inhaler 2   allopurinol (ZYLOPRIM) 300 MG tablet Take 300 mg by mouth daily.     amLODipine (NORVASC) 10 MG tablet TAKE 1 TABLET BY MOUTH EVERYDAY AT BEDTIME 90 tablet 3   Ascorbic Acid (VITAMIN C) 1000 MG tablet Take 1,000 mg by mouth daily.     ASPIRIN LOW DOSE 81 MG EC tablet TAKE 1 TABLET BY MOUTH EVERY DAY 90 tablet 3   atorvastatin (LIPITOR) 20 MG tablet Take 20 mg by mouth daily.     b complex vitamins tablet Take 1 tablet by mouth daily.     bisoprolol (ZEBETA) 10 MG tablet Take 1 tablet (10 mg total) by mouth daily. 30 tablet 11   Cholecalciferol (VITAMIN D3) 1000 units CAPS Take 1,000 Units by mouth daily.     clobetasol cream (TEMOVATE) 3.41 % Apply 1 application topically as needed.      co-enzyme Q-10 30 MG capsule Take 30 mg by mouth daily.     ezetimibe (ZETIA) 10 MG tablet Take 1 tablet (10 mg total) by mouth daily. 30 tablet 11   furosemide (LASIX) 40 MG tablet TAKE 2 TABLETS BY MOUTH EVERY MORNING AND 1  TABLET BY MOUTH EVERY OTHER EVENING 270 tablet 3   KLOR-CON M20 20 MEQ tablet TAKE 3 TABLETS (60 MEQ TOTAL) BY MOUTH DAILY. 270 tablet 3   loratadine (CLARITIN) 10 MG tablet Take 10 mg by mouth as needed.      macitentan (OPSUMIT) 10 MG tablet Take 1 tablet (10 mg total) by mouth daily. 30 tablet 11   meloxicam (MOBIC) 15 MG tablet Take 1 tablet by mouth as needed.     metFORMIN (GLUCOPHAGE) 500 MG tablet Take 500 mg by mouth 2 (two) times daily with a meal.     Omega 3  1000 MG CAPS Take 1 capsule by mouth daily.     omeprazole (PRILOSEC) 10 MG capsule Take 10 mg by mouth daily.     OXYGEN Inhale 2 L into the lungs continuous.     sildenafil (REVATIO) 20 MG tablet TAKE 4 TABLETS (80 MG TOTAL) BY MOUTH 3 (THREE) TIMES DAILY. 1080 tablet 3   TRELEGY ELLIPTA 100-62.5-25 MCG/INH AEPB TAKE 1 PUFF BY MOUTH EVERY DAY 180 each 4   No current facility-administered medications for this encounter.    Allergies:   Aldomet [methyldopa] and Tyvaso [treprostinil]   Social History:  The patient  reports that she quit smoking about 16 years ago. Her smoking use included cigarettes. She has a 60.00 pack-year smoking history. She has quit using smokeless tobacco. She reports that she does not drink alcohol and does not use drugs.   Family History:  The patient's family history includes Heart disease in her brother, brother, mother, sister, and sister; Heart failure in her father and mother.   ROS:  Please see the history of present illness.   All other systems are personally reviewed and negative.   Exam:   BP 140/70   Pulse 68   Wt 51.7 kg (114 lb)   SpO2 99% Comment: 2 liters N/C  BMI 20.85 kg/m  General: NAD Neck: No JVD, no thyromegaly or thyroid nodule.  Lungs: Clear to auscultation bilaterally with normal respiratory effort. CV: Nondisplaced PMI.  Heart regular S1/S2, no S3/S4, 1/6 SEM RUSB.  No peripheral edema.  No carotid bruit.  Normal pedal pulses.  Abdomen: Soft, nontender, no  hepatosplenomegaly, no distention.  Skin: Intact without lesions or rashes.  Neurologic: Alert and oriented x 3.  Psych: Normal affect. Extremities: No clubbing or cyanosis.  HEENT: Normal.   Recent Labs: 10/08/2020: ALT 15 12/05/2020: Magnesium 1.9 07/11/2021: B Natriuretic Peptide 26.7; BUN 55; Creatinine, Ser 1.26; Potassium 3.6; Sodium 137  Personally reviewed   Wt Readings from Last 3 Encounters:  07/11/21 51.7 kg (114 lb)  03/18/21 51.7 kg (114 lb)  03/11/21 51.3 kg (113 lb)    ASSESSMENT AND PLAN:  1. 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  She was very short of breath with exertion and had very elevated right-sided filling pressures on 5/17 RHC.  PA pressure was systemic. Cardiac output low, but not moving towards IV Flolan given the mixed etiology of her PH.  V/Q scan not suggestive of chronic PEs and autoimmune serologies sent (RF negative, ANCA negative, ANA only weakly positive (1:80). She has been unable to tolerate selexipag, Tyvaso, or riociguat. Echo in 1/22 showed normal RV size and systolic function and estimation of PA pressure, surprisingly, was not significantly elevated.  NYHA class II.   - 6 minute walk today.  - Continue sildenafil 80 mg tid.  - Continue Opsumit 10 mg daily.  - Check BNP.  2. COPD: On home oxygen.  Prior long-time smoker. No change.   3. Chronic diastolic CHF with prominent RV failure: Echo in 1/22 with RV function improved, appears normal. NYHA class II symptoms. Weight stable. Echo in 12/20 showed a mid-cavity gradient with vigorous LV contraction, 1/22 echo showed a lower mid-cavity gradient.  - Continue Lasix 80 qam, 40 every other pm.  BMET today.  - Continue bisoprolol 10 mg daily (beta-1 selective beta blocker with COPD) with mid-cavity gradient and HTN.  4. SVT: Denies palpitations.  5. OSA: Has  been unable to tolerate CPAP.  She uses  home oxygen.  OSA may have small contribution to [facility] but does not explain the extent of PAH.  6. Hyperlipidemia - Continue statin, good lipids 11/21.  7. HTN: BP controlled.    8. Aortic stenosis: Mild.   Followup in 4 months.   Signed, Loralie Champagne, MD  07/11/2021  [place] [address] [facility] [place] [address] [phone] (office) [phone] (fax)

## 2021-08-02 ENCOUNTER — Other Ambulatory Visit (HOSPITAL_COMMUNITY): Payer: Self-pay | Admitting: Cardiology

## 2021-08-26 ENCOUNTER — Other Ambulatory Visit (HOSPITAL_COMMUNITY): Payer: Self-pay | Admitting: Cardiology

## 2021-09-02 ENCOUNTER — Other Ambulatory Visit (HOSPITAL_COMMUNITY): Payer: Self-pay | Admitting: Cardiology

## 2021-09-03 ENCOUNTER — Other Ambulatory Visit (HOSPITAL_COMMUNITY): Payer: Self-pay | Admitting: Cardiology

## 2021-09-03 DIAGNOSIS — I509 Heart failure, unspecified: Secondary | ICD-10-CM

## 2021-09-15 ENCOUNTER — Ambulatory Visit (HOSPITAL_COMMUNITY): Payer: Medicare HMO | Admitting: Pharmacist

## 2021-09-15 DIAGNOSIS — J810 Acute pulmonary edema: Secondary | ICD-10-CM

## 2021-09-15 MED ORDER — OPSUMIT 10 MG PO TABS: 10.0000 mg | ORAL_TABLET | Freq: Every day | ORAL | 11 refills | Status: DC

## 2021-09-15 NOTE — Telephone Encounter (Signed)
Opsumit refill sent to [facility].  Audry Riles, PharmD, BCPS, BCCP, CPP [facility] Pharmacist [phone]

## 2021-09-18 ENCOUNTER — Ambulatory Visit: Payer: Medicare HMO | Admitting: Internal Medicine

## 2021-09-29 ENCOUNTER — Encounter: Payer: Self-pay | Admitting: Internal Medicine

## 2021-09-29 ENCOUNTER — Ambulatory Visit: Payer: Medicare HMO | Admitting: Internal Medicine

## 2021-09-29 ENCOUNTER — Other Ambulatory Visit: Payer: Self-pay

## 2021-09-29 ENCOUNTER — Ambulatory Visit (INDEPENDENT_AMBULATORY_CARE_PROVIDER_SITE_OTHER): Payer: Medicare HMO | Admitting: Internal Medicine

## 2021-09-29 DIAGNOSIS — J9612 Chronic respiratory failure with hypercapnia: Secondary | ICD-10-CM | POA: Diagnosis not present

## 2021-09-29 DIAGNOSIS — J9611 Chronic respiratory failure with hypoxia: Secondary | ICD-10-CM

## 2021-09-29 DIAGNOSIS — J449 Chronic obstructive pulmonary disease, unspecified: Secondary | ICD-10-CM

## 2021-09-29 NOTE — Progress Notes (Signed)
Subjective:    Patient ID: Beth [name], female    DOB: 10/23/1941    MRN: [id number]    Brief patient profile:  78  yobf quit smoking around 2006-7 when noted onset of doe then gradually worse since quit with no gain in wt and referred 05/31/2012 for copd evaluation by Dr Karlton Lemon with GOLD III COPD by PFT's 06/2012    History of Present Illness  05/31/2012 1st pulmonary eval cc progressive doe x 5years ? Some worse in spring and ? Summer with hot humid weather with subjective wheeze while on spiriva x 2 years then advair 7/13 and prednisone has helped to point where need neb less "only  3 x day" and puffer 3-4 times as well. Assoc with voice change/ hoarsenss, ear pain.   No unusual cough, purulent sputum or sinus/hb symptoms on present rx. rec Work on inhaler technique:   Only use your albuterol as a rescue medication (proaire is Plan B,  Nebulizer is C) Add pepcid 20 mg one every night at bedtime GERD diet office visit in 4 weeks, sooner if needed with PFTs> GOLD III criteria > symb 160 2bid prn saba        Admit date: 06/22/2017 Discharge date: 06/24/2017   Brief/Interim Summary: For complete details see H&P but in brief. Beth [name] is a 80 year old female with medical history of hypertension, hyperlipidemia, diabetes mellitus, COPD on 2 L oxygen at home, asthma, GERD, diastolic CHF, pulmonary hypertension, chronic kidney disease stage III who presented with confusion and slurred speech. Patient daughter reported that patient became confused and noted mild slurred speech but no unilateral weakness. Upon ED evaluation patient was already alert and oriented 3 with no weakness or slurred speech. She was found to be hypotensive with blood pressure of 79/42 which improved after IV fluid bolus. CT scan of the head did not show any acute abnormalities and she was placed on observation for further evaluation. MRI of the head was done with negative results. On blood work up her creatinine was  found to be slight worsening and this was treated with IV fluids. Patient clinically improved, was elevated by physical therapy who recommended PT/OT at home. Blood pressure has remained stable and no further neurologic deficit has been noted.   Subjective: Patient seen and examined on the day of discharge she reports doing much better than when she was admitted. Has no complaint. Her breathing is at baseline. Denies any weakness, dizziness, chest pain, palpitations and blurry vision. Patient remains afebrile, tolerating diet well. Kidney function has improved   Discharge Diagnoses/Hospital Course:  Hypotension Multifactorial, patient on multiple blood pressure lowering agents, diuretics and medications for pulmonary hypertension. She was treated with IV fluids which improved blood pressure. During hospital [name], home medications were held, and blood pressure went above goal with IV fluids. Macitentan and Riociguat were resume and blood pressure up on discharge was more stable. Valsartan and HCTZ were held and will continue to be on hold until patient is seen by cardiology or primary care physician.   Acute metabolic encephalopathy Hypotension and dehydration may have contributed to her AMS, although would not explain slurred speech. Workup so far reassuring as MRI and CT of the head not show any acute abnormality. Patient is back to baseline, she is alert and oriented 3 and her speech is clear. Will defer to PCP if further workup is necessary.   Acute renal failure on chronic kidney disease Baseline creatinine is 1.0, pulmonary admission serum  creatinine was 2.0. Highly secondary to dehydration and continuation of ARB is, diuretics and NSAIDs. Patient also hypotensive ATN maybe a component. Serum creatinine back to baseline, improving with IV fluids. Patient was encouraged to follow-up with primary care physician. And encouraged to keep good oral hydration.    COPD - stable No changes in  medication were made during hospital [name]   Chronic diastolic heart failure Patient was dry during hospital [name]. She was given IV fluid therefore will resume Lasix upon discharge. Follow-up with cardiology   Hypertension Patient was admitted due to hypotension Valsartan and HCTZ were placed on hold, and will continue to be on hold until patient is evaluated by cardiology or primary care physician. Patient on medications for pulmonary hypertension which have fairly significant effect on lowering BP.    Pulmonary hypertension Continue Macitentan and Riociguat   All other chronic medical condition were stable during the hospitalization.  Patient seen by PT recommending [facility] PT/OT  On the day of the discharge the patient's vitals were stable, and no other acute medical condition were reported by patient. Patient was felt safe to be discharge to home      07/16/2017  f/u ov/Beth [name] re: COPD/ chronic resp failure/ cor pulmonale on symbicort but unable to use effectively  Chief Complaint  Patient presents with   Pulmonary Consult    SOB with exertion referred by Dr. Willey Blade  gradually downhill since  may of 2017 with worse breathing no cough increase 02= 2lpm   Doe = MMRC4  = sob if tries to leave home or while getting dressed  rec Plan A = Automatic = Trelegy one click each am - call if any problem acquiring  Plan B = Backup Only use your albuterol as a rescue medication  Plan C = Crisis - only use your albuterol nebulizer if you first try Plan B and it fails to help      12/27/2017  f/u ov/Beth [name] re:  GOLD III/ 02  19/24 2lpm  With cor pulmonale  Chief Complaint  Patient presents with   Follow-up    Breathing has improved. She states she has not had to use her albuterol inhaler or neb.   Dyspnea:   MMRC3 = can't walk 100 yards even at a slow pace at a flat grade s stopping due to sob on 2lpm  Cough: no Sleep: ok on 2lpm one pillows  rec Plan A = Automatic = Trelegy one click  each am    Plan B = Backup Only use your albuterol as a rescue medication Plan C = Crisis - only use your albuterol nebulizer if you first try Plan B and it fails to help > ok to use the nebulizer up to every 4 hours but if start needing it regularly call for immediate appointment Please schedule a follow up visit in 6  months but call sooner if needed - PLEASE BRING ALL INHALERS/SOLUTIONS WITH YOU ON RETURN     06/28/2018  f/u ov/Beth [name] re:  GOLD III/ 02 dep  Chief Complaint  Patient presents with   Follow-up    Breathing is overall doing well. She is using her albuterol inhaler about once per wk and rarely uses neb.   Dyspnea:  [employer]  MMRC3 = can't walk 100 yards even at a slow pace at a flat grade s stopping due to sob  On 2lpm Cough: no Sleeping: one pillow and 2lpm  SABA use: rare 02: does not vary = 2lpm  rec No  Change in medications  02 is 2lpm at bedtime and ok to adjust during the day to keep your 02 saturations above 90% (no need for 02 at all if over 90% without it) Please schedule a follow up visit in 6 months but call sooner if needed  with all medications /inhalers/ solutions in hand so we can verify exactly what you are taking. This includes all medications from all doctors and over the counters    01/02/2019  f/u ov/Beth [name] re:  GOLD III/ 02 dep 2lpm does not adjust/ no meds  Chief Complaint  Patient presents with   Follow-up    6 month follow up = patient stated her breathing has been good. Has not needed to use her rescue medications often.  Dyspnea:  Can still do [employer] but nothing bigger on 2lpm slow pace = MMRC3 = can't walk 100 yards even at a slow pace at a flat grade s stopping due to sob   Cough: none/ but hoarse  Sleeping: one pillow / bed is flat SABA use: rarely use hfa/ much less neb saba rec Remember to hold the device with right hand and open with left and use arm and hammer and brush teeth after using first thing in am  Adjust your 02 if needed  with activity to be sure you are above 90%       03/15/2020  f/u ov/Beth [name] re: copd GOLD III/ on trelegy plus 02 2lpm  24/7 does not adjust it  Chief Complaint  Patient presents with   Follow-up  Dyspnea:  Still able to do [employer] but not the whole store = MMRC3 = can't walk 100 yards even at a slow pace at a flat grade s stopping due to sob    Cough: no Sleeping: ok flat / 2 pillows  SABA use: rarely 02: 2lpm  Rec Alb prn       09/29/2021  f/u ov/Beth [name] re: GOLD 3 plus 02 dep maint on trelegy  Chief Complaint  Patient presents with   Follow-up   COPD  Dyspnea:  [employer] shopping /neighborhood  Cough: none  Sleeping: bed is flat / one pillow  SABA use: none  02: 2lpm 24/7  Covid status:   vax x 3    No obvious day to day or daytime variability or assoc excess/ purulent sputum or mucus plugs or hemoptysis or cp or chest tightness, subjective wheeze or overt sinus or hb symptoms.   Sleeping  without nocturnal  or early am exacerbation  of respiratory  c/o's or need for noct saba. Also denies any obvious fluctuation of symptoms with weather or environmental changes or other aggravating or alleviating factors except as outlined above   No unusual exposure hx or h/o childhood pna/ asthma or knowledge of premature birth.  Current Allergies, Complete Past Medical History, Past Surgical History, Family History, and Social History were reviewed in [employer] record.  ROS  The following are not active complaints unless bolded Hoarseness, sore throat, dysphagia, dental problems, itching, sneezing,  nasal congestion or discharge of excess mucus or purulent secretions, ear ache,   fever, chills, sweats, unintended wt loss or wt gain, classically pleuritic or exertional cp,  orthopnea pnd or arm/hand swelling  or leg swelling, presyncope, palpitations, abdominal pain, anorexia, nausea, vomiting, diarrhea  or change in bowel habits or change in bladder habits, change  in stools or change in urine, dysuria, hematuria,  rash, arthralgias, visual complaints, headache, numbness,  weakness or ataxia or problems with walking or coordination,  change in mood or  memory.        Current Meds  Medication Sig   albuterol (PROVENTIL) (2.5 MG/3ML) 0.083% nebulizer solution Take 2.5 mg by nebulization every 6 (six) hours as needed for wheezing or shortness of breath.   albuterol (VENTOLIN HFA) 108 (90 Base) MCG/ACT inhaler INHALE 2 PUFFS INTO THE LUNGS EVERY 4 HOURS AS NEEDED ONLY IF YOU CANT CATCH YOUR BREATH   allopurinol (ZYLOPRIM) 300 MG tablet Take 300 mg by mouth daily.   amLODipine (NORVASC) 10 MG tablet TAKE 1 TABLET BY MOUTH EVERYDAY AT BEDTIME   Ascorbic Acid (VITAMIN C) 1000 MG tablet Take 1,000 mg by mouth daily.   ASPIRIN LOW DOSE 81 MG EC tablet TAKE 1 TABLET BY MOUTH EVERY DAY   atorvastatin (LIPITOR) 20 MG tablet Take 20 mg by mouth daily.   b complex vitamins tablet Take 1 tablet by mouth daily.   bisoprolol (ZEBETA) 10 MG tablet Take 1 tablet (10 mg total) by mouth daily.   Cholecalciferol (VITAMIN D3) 1000 units CAPS Take 1,000 Units by mouth daily.   clobetasol cream (TEMOVATE) 5.50 % Apply 1 application topically as needed.    co-enzyme Q-10 30 MG capsule Take 30 mg by mouth daily.   ezetimibe (ZETIA) 10 MG tablet TAKE 1 TABLET BY MOUTH EVERY DAY   furosemide (LASIX) 40 MG tablet TAKE 2 TABLETS BY MOUTH EVERY MORNING AND 1 TABLET BY MOUTH EVERY OTHER EVENING   KLOR-CON M20 20 MEQ tablet TAKE 3 TABLETS (60 MEQ TOTAL) BY MOUTH DAILY.   loratadine (CLARITIN) 10 MG tablet Take 10 mg by mouth as needed.    macitentan (OPSUMIT) 10 MG tablet Take 1 tablet (10 mg total) by mouth daily.   meloxicam (MOBIC) 15 MG tablet Take 1 tablet by mouth as needed.   metFORMIN (GLUCOPHAGE) 500 MG tablet Take 500 mg by mouth 2 (two) times daily with a meal.   Omega 3 1000 MG CAPS Take 1 capsule by mouth daily.   omeprazole (PRILOSEC) 10 MG capsule Take 10 mg by mouth  daily.   OXYGEN Inhale 2 L into the lungs continuous.   sildenafil (REVATIO) 20 MG tablet TAKE 4 TABLETS (80 MG TOTAL) BY MOUTH 3 (THREE) TIMES DAILY.   TRELEGY ELLIPTA 100-62.5-25 MCG/INH AEPB TAKE 1 PUFF BY MOUTH EVERY DAY               Objective:   Physical Exam  09/29/2021      117  03/15/2020        117  07/12/2019        127  01/02/2019        124  06/28/2018       128  12/27/2017       125   07/16/17 131 lb 12.8 oz (59.8 kg)  06/07/17 137 lb 4 oz (62.3 kg)  04/13/17 143 lb 8 oz (65.1 kg)     Vital signs reviewed  09/29/2021  - Note at rest 02 sats  100% on 2lpm   General appearance:   slt hoarse amb bf nad    Top dentures reported    HEENT : pt wearing mask not removed for exam due to covid - 19 concerns.    NECK :  without JVD/Nodes/TM/ nl carotid upstrokes bilaterally   LUNGS: no acc muscle use,  Mild barrel  contour chest wall with bilateral  Distant bs s audible wheeze and  without cough on insp or exp maneuvers  and mild  Hyperresonant  to  percussion bilaterally     CV:  RRR  no s3 or murmur or increase in P2, and no edema   ABD:  soft and nontender with pos end  insp Hoover's  in the supine position. No bruits or organomegaly appreciated, bowel sounds nl  MS:   Nl gait/  ext warm without deformities, calf tenderness, cyanosis or clubbing No obvious joint restrictions   SKIN: warm and dry with venous stasis changes both LE's   NEURO:  alert, approp, nl sensorium with  no motor or cerebellar deficits apparent.                      I personally reviewed images and agree with radiology impression as follows:  CXR:   03/18/21 Pa and lat 1.  No radiographic evidence of acute cardiopulmonary disease. 2. Emphysema. 3. Aortic atherosclerosis       Assessment & Plan:

## 2021-09-29 NOTE — Progress Notes (Deleted)
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 Some worse in spring and ? Summer with hot humid weather with subjective wheeze while on spiriva x 2 years then advair 7/13 and prednisone has helped to point where need neb less "only  3 x day" and puffer 3-4 times as well. Assoc with voice change/ hoarsenss, ear pain.   No unusual cough, purulent sputum or sinus/hb symptoms on present rx. rec Work on inhaler technique:   Only use your albuterol as a rescue medication (proaire is Plan B,  Nebulizer is C) Add pepcid 20 mg one every night at bedtime GERD diet office visit in 4 weeks, sooner if needed with PFTs> GOLD III criteria > symb 160 2bid prn saba        Admit date: 06/22/2017 Discharge date: 06/24/2017   Brief/Interim Summary: For complete details see H&P but in brief. Beth [name] is a 80 year old female with medical history of hypertension, hyperlipidemia, diabetes mellitus, COPD on 2 L oxygen at home, asthma, GERD, diastolic CHF, pulmonary hypertension, chronic kidney disease stage III who presented with confusion and slurred speech. Patient daughter reported that patient became confused and noted mild slurred speech but no unilateral weakness. Upon ED evaluation patient was already alert and oriented 3 with no weakness or slurred speech. She was found to be hypotensive with blood pressure of 79/42 which improved after IV fluid bolus. CT scan of the head did not show any acute abnormalities and she was placed on observation for further evaluation. MRI of the head was done with negative results. On blood work up her creatinine was  found to be slight worsening and this was treated with IV fluids. Patient clinically improved, was elevated by physical therapy who recommended PT/OT at home. Blood pressure has remained stable and no further neurologic deficit has been noted.   Subjective: Patient seen and examined on the day of discharge she reports doing much better than when she was admitted. Has no complaint. Her breathing is at baseline. Denies any weakness, dizziness, chest pain, palpitations and blurry vision. Patient remains afebrile, tolerating diet well. Kidney function has improved   Discharge Diagnoses/Hospital Course:  Hypotension Multifactorial, patient on multiple blood pressure lowering agents, diuretics and medications for pulmonary hypertension. She was treated with IV fluids which improved blood pressure. During hospital stay, home medications were held, and blood pressure went above goal with IV fluids. Macitentan and Riociguat were resume and blood pressure up on discharge was more stable. Valsartan and HCTZ were held and will continue to be on hold until patient is seen by cardiology or primary care physician.   Acute metabolic encephalopathy Hypotension and dehydration may have contributed to her AMS, although would not explain slurred speech. Workup so far reassuring as MRI and CT of the head not show any acute abnormality. Patient is back to baseline, she is alert and oriented 3 and her speech is clear. Will defer to PCP if further workup is necessary.   Acute renal failure on chronic kidney disease Baseline creatinine is 1.0, pulmonary admission serum creatinine  was 2.0. Highly secondary to dehydration and continuation of ARB is, diuretics and NSAIDs. Patient also hypotensive ATN maybe a component. Serum creatinine back to baseline, improving with IV fluids. Patient was encouraged to follow-up with primary care physician. And encouraged to keep good oral hydration.    COPD - stable No changes in  medication were made during hospital stay   Chronic diastolic heart failure Patient was dry during hospital stay. She was given IV fluid therefore will resume Lasix upon discharge. Follow-up with cardiology   Hypertension Patient was admitted due to hypotension Valsartan and HCTZ were placed on hold, and will continue to be on hold until patient is evaluated by cardiology or primary care physician. Patient on medications for pulmonary hypertension which have fairly significant effect on lowering BP.    Pulmonary hypertension Continue Macitentan and Riociguat   All other chronic medical condition were stable during the hospitalization.  Patient seen by PT recommending [place] PT/OT  On the day of the discharge the patient's vitals were stable, and no other acute medical condition were reported by patient. Patient was felt safe to be discharge to home      07/16/2017  f/u ov/Saranya Harlin re: COPD/ chronic resp failure/ cor pulmonale on symbicort but unable to use effectively  Chief Complaint  Patient presents with   Pulmonary Consult    SOB with exertion referred by Dr. Willey Blade  gradually downhill since  may of 2017 with worse breathing no cough increase 02= 2lpm   Doe = MMRC4  = sob if tries to leave home or while getting dressed  rec Plan A = Automatic = Trelegy one click each am - call if any problem acquiring  Plan B = Backup Only use your albuterol as a rescue medication  Plan C = Crisis - only use your albuterol nebulizer if you first try Plan B and it fails to help      12/27/2017  f/u ov/Zipporah Finamore re:  GOLD III/ 02  19/24 2lpm  With cor pulmonale  Chief Complaint  Patient presents with   Follow-up    Breathing has improved. She states she has not had to use her albuterol inhaler or neb.   Dyspnea:   MMRC3 = can't walk 100 yards even at a slow pace at a flat grade s stopping due to sob on 2lpm  Cough: no Sleep: ok on 2lpm one pillows  rec Plan A = Automatic = Trelegy one click  each am    Plan B = Backup Only use your albuterol as a rescue medication Plan C = Crisis - only use your albuterol nebulizer if you first try Plan B and it fails to help > ok to use the nebulizer up to every 4 hours but if start needing it regularly call for immediate appointment Please schedule a follow up visit in 6  months but call sooner if needed - PLEASE BRING ALL INHALERS/SOLUTIONS WITH YOU ON RETURN     06/28/2018  f/u ov/Miabella Shannahan re:  GOLD III/ 02 dep  Chief Complaint  Patient presents with   Follow-up    Breathing is overall doing well. She is using her albuterol inhaler about once per wk and rarely uses neb.   Dyspnea:  foodlion  MMRC3 = can't walk 100 yards even at a slow pace at a flat grade s stopping due to sob  On 2lpm Cough: no Sleeping: one pillow and 2lpm  SABA use: rare 02: does not vary = 2lpm  rec  No  Change in medications  02 is 2lpm at bedtime and ok to adjust during the day to keep your 02 saturations above 90% (no need for 02 at all if over 90% without it) Please schedule a follow up visit in 6 months but call sooner if needed  with all medications /inhalers/ solutions in hand so we can verify exactly what you are taking. This includes all medications from all doctors and over the counters    01/02/2019  f/u ov/Shandon Burlingame re:  GOLD III/ 02 dep 2lpm does not adjust/ no meds  Chief Complaint  Patient presents with   Follow-up    6 month follow up = patient stated her breathing has been good. Has not needed to use her rescue medications often.  Dyspnea:  Can still do food lion but nothing bigger on 2lpm slow pace = MMRC3 = can't walk 100 yards even at a slow pace at a flat grade s stopping due to sob   Cough: none/ but hoarse  Sleeping: one pillow / bed is flat SABA use: rarely use hfa/ much less neb saba rec Remember to hold the device with right hand and open with left and use arm and hammer and brush teeth after using first thing in am  Adjust your 02 if needed  with activity to be sure you are above 90%       03/15/2020  f/u ov/Madox Corkins re: copd GOLD III/ on trelegy plus 02 2lpm  24/7 does not adjust it  Chief Complaint  Patient presents with   Follow-up  Dyspnea:  Still able to do food lion but not the whole store = MMRC3 = can't walk 100 yards even at a slow pace at a flat grade s stopping due to sob    Cough: no Sleeping: ok flat / 2 pillows  SABA use: rarely 02: 2lpm  Rec Alb prn       09/29/2021  f/u ov/Pericles Carmicheal re: GOLD 3 plus 02 dep    maint on ***  No chief complaint on file.   Dyspnea:  *** Cough: *** Sleeping: *** SABA use: *** 02: *** Covid status:   ***   No obvious day to day or daytime variability or assoc excess/ purulent sputum or mucus plugs or hemoptysis or cp or chest tightness, subjective wheeze or overt sinus or hb symptoms.   *** without nocturnal  or early am exacerbation  of respiratory  c/o's or need for noct saba. Also denies any obvious fluctuation of symptoms with weather or environmental changes or other aggravating or alleviating factors except as outlined above   No unusual exposure hx or h/o childhood pna/ asthma or knowledge of premature birth.  Current Allergies, Complete Past Medical History, Past Surgical History, Family History, and Social History were reviewed in [employer] record.  ROS  The following are not active complaints unless bolded Hoarseness, sore throat, dysphagia, dental problems, itching, sneezing,  nasal congestion or discharge of excess mucus or purulent secretions, ear ache,   fever, chills, sweats, unintended wt loss or wt gain, classically pleuritic or exertional cp,  orthopnea pnd or arm/hand swelling  or leg swelling, presyncope, palpitations, abdominal pain, anorexia, nausea, vomiting, diarrhea  or change in bowel habits or change in bladder habits, change in stools or change in urine, dysuria, hematuria,  rash, arthralgias, visual complaints, headache,  numbness, weakness or ataxia or problems with walking or coordination,  change in mood or  memory.  No outpatient medications have been marked as taking for the 09/29/21 encounter (Appointment) with Tanda Rockers, MD.               Objective:   Physical Exam  09/29/2021       *** 03/15/2020        117  07/12/2019        127  01/02/2019        124  06/28/2018       128  12/27/2017       125   07/16/17 131 lb 12.8 oz (59.8 kg)  06/07/17 137 lb 4 oz (62.3 kg)  04/13/17 143 lb 8 oz (65.1 kg)     Vital signs reviewed  09/29/2021  - Note at rest 02 sats  ***% on ***   General appearance:    ***   reports Top dentures   Mild bar  venous stasis changes both LE's  ***             I personally reviewed images and agree with radiology impression as follows:  CXR:   03/18/21 Pa and lat 1.  No radiographic evidence of acute cardiopulmonary disease. 2. Emphysema. 3. Aortic atherosclerosis       Assessment & Plan:

## 2021-09-29 NOTE — Assessment & Plan Note (Addendum)
HC03  06/23/17 = 31  But as high as 35 one month prior HCO3  12/14/17 = 29  - 06/28/2018  Walked 2lpm  x 3 laps @ 185 ft each stopped due to  End of study, slow pace, no  desat  And mild sob - HC03  12/07/18 = 32  - 01/02/2019   Walked 2lpm   2 laps @  approx 263f each @ slow pace  stopped due to sob s desats    - HCO3   04/21/19  = 27 c/w resolving hypercarbia - RA sats 96% 07/12/2019   Due to cor pulmonale reviewed goal = keep sats > 90% by adjust 02 flow upward if needed with exertion    >>> f/u 12 months, call sooner if needed       Each maintenance medication was reviewed in detail including emphasizing most importantly the difference between maintenance and prns and under what circumstances the prns are to be triggered using an action plan format where appropriate.  Total time for H and P, chart review, counseling, reviewing dpi/ hfa/02 device(s) and generating customized AVS unique to this office visit / same day charting =  20 min

## 2021-09-29 NOTE — Assessment & Plan Note (Signed)
Quit smoking around 2006-7    - PFT's 06/30/2012 FEV1  0.93 (49%) ratio 49 and DLCO 47 corrects to 87   - 07/16/2017   > trial of trelegy  - Spirometry 07/16/2017  FEV1 0.57 (38%)  Ratio 54 with atypical f/v with no rx prior  - 01/02/2019  After extensive coaching inhaler device,  effectiveness =    90% with elipta vs 75% baseline    Group D in terms of symptom/risk and laba/lama/ICS  therefore appropriate rx at this point >>>  Continue trelgy and prn saba

## 2021-09-29 NOTE — Patient Instructions (Signed)
No change in medications   Please schedule a follow up visit in 12  months but call sooner if needed

## 2021-10-12 ENCOUNTER — Other Ambulatory Visit (HOSPITAL_COMMUNITY): Payer: Self-pay | Admitting: Cardiology

## 2021-10-27 ENCOUNTER — Telehealth (HOSPITAL_COMMUNITY): Payer: Self-pay | Admitting: Pharmacist

## 2021-10-27 NOTE — Telephone Encounter (Signed)
Patient Advocate Encounter   Received notification from [facility] that prior authorization for sildenafil is required.   PA submitted on CoverMyMeds Key [id number] Status is pending   Will continue to follow.   Audry Riles, PharmD, BCPS, BCCP, CPP Heart Failure Clinic Pharmacist [phone]

## 2021-10-27 NOTE — Telephone Encounter (Signed)
Advanced Heart Failure Patient Advocate Encounter  Prior Authorization for sildenafil has been approved.     Effective dates: 11/16/21 through 11/15/22  Audry Riles, PharmD, BCPS, BCCP, CPP Heart Failure Clinic Pharmacist [phone]

## 2021-10-27 NOTE — Telephone Encounter (Signed)
Advanced Heart Failure Patient Advocate Encounter  Prior Authorization for Opsumit has been approved.    PA# [id number] Effective dates: 11/16/20 through 11/15/22  Audry Riles, PharmD, BCPS, BCCP, CPP Heart Failure Clinic Pharmacist [phone]

## 2021-10-27 NOTE — Telephone Encounter (Signed)
Patient Advocate Encounter   Received notification from [facility] that prior authorization for Opsumit is required.   PA submitted on CoverMyMeds Key [id number] Status is pending   Will continue to follow.   Audry Riles, PharmD, BCPS, BCCP, CPP Heart Failure Clinic Pharmacist [phone]

## 2021-11-11 ENCOUNTER — Encounter (HOSPITAL_COMMUNITY): Payer: Self-pay | Admitting: Cardiology

## 2021-11-11 ENCOUNTER — Ambulatory Visit (HOSPITAL_COMMUNITY)
Admission: RE | Admit: 2021-11-11 | Discharge: 2021-11-11 | Disposition: A | Discharge status: Home / Self Care | Payer: Medicare HMO | Source: Ambulatory Visit | Attending: Cardiology | Admitting: Cardiology

## 2021-11-11 ENCOUNTER — Telehealth (HOSPITAL_COMMUNITY): Payer: Self-pay | Admitting: Surgery

## 2021-11-11 ENCOUNTER — Other Ambulatory Visit (HOSPITAL_COMMUNITY): Payer: Self-pay | Admitting: Surgery

## 2021-11-11 ENCOUNTER — Telehealth (HOSPITAL_COMMUNITY): Payer: Self-pay

## 2021-11-11 ENCOUNTER — Other Ambulatory Visit: Payer: Self-pay

## 2021-11-11 VITALS — BP 130/76 | HR 63 | Wt 119.0 lb

## 2021-11-11 DIAGNOSIS — I5032 Chronic diastolic (congestive) heart failure: Secondary | ICD-10-CM

## 2021-11-11 DIAGNOSIS — J449 Chronic obstructive pulmonary disease, unspecified: Secondary | ICD-10-CM | POA: Insufficient documentation

## 2021-11-11 DIAGNOSIS — R0600 Dyspnea, unspecified: Secondary | ICD-10-CM | POA: Insufficient documentation

## 2021-11-11 DIAGNOSIS — I350 Nonrheumatic aortic (valve) stenosis: Secondary | ICD-10-CM | POA: Diagnosis not present

## 2021-11-11 DIAGNOSIS — E1122 Type 2 diabetes mellitus with diabetic chronic kidney disease: Secondary | ICD-10-CM | POA: Diagnosis not present

## 2021-11-11 DIAGNOSIS — G4733 Obstructive sleep apnea (adult) (pediatric): Secondary | ICD-10-CM | POA: Diagnosis not present

## 2021-11-11 DIAGNOSIS — I509 Heart failure, unspecified: Secondary | ICD-10-CM

## 2021-11-11 DIAGNOSIS — Z79899 Other long term (current) drug therapy: Secondary | ICD-10-CM | POA: Insufficient documentation

## 2021-11-11 DIAGNOSIS — Z09 Encounter for follow-up examination after completed treatment for conditions other than malignant neoplasm: Secondary | ICD-10-CM | POA: Insufficient documentation

## 2021-11-11 DIAGNOSIS — I130 Hypertensive heart and chronic kidney disease with heart failure and stage 1 through stage 4 chronic kidney disease, or unspecified chronic kidney disease: Secondary | ICD-10-CM | POA: Diagnosis not present

## 2021-11-11 DIAGNOSIS — I471 Supraventricular tachycardia: Secondary | ICD-10-CM | POA: Diagnosis not present

## 2021-11-11 DIAGNOSIS — N189 Chronic kidney disease, unspecified: Secondary | ICD-10-CM | POA: Insufficient documentation

## 2021-11-11 DIAGNOSIS — Z87891 Personal history of nicotine dependence: Secondary | ICD-10-CM | POA: Insufficient documentation

## 2021-11-11 DIAGNOSIS — E785 Hyperlipidemia, unspecified: Secondary | ICD-10-CM | POA: Insufficient documentation

## 2021-11-11 DIAGNOSIS — Z9981 Dependence on supplemental oxygen: Secondary | ICD-10-CM | POA: Insufficient documentation

## 2021-11-11 DIAGNOSIS — I272 Pulmonary hypertension, unspecified: Secondary | ICD-10-CM | POA: Diagnosis not present

## 2021-11-11 LAB — BRAIN NATRIURETIC PEPTIDE: B Natriuretic Peptide: 124.2 pg/mL — ABNORMAL HIGH (ref 0.0–100.0)

## 2021-11-11 LAB — BASIC METABOLIC PANEL
Anion gap: 10 (ref 5–15)
BUN: 61 mg/dL — ABNORMAL HIGH (ref 8–23)
CO2: 27 mmol/L (ref 22–32)
Calcium: 9.2 mg/dL (ref 8.9–10.3)
Chloride: 102 mmol/L (ref 98–111)
Creatinine, Ser: 1.67 mg/dL — ABNORMAL HIGH (ref 0.44–1.00)
GFR, Estimated: 31 mL/min — ABNORMAL LOW (ref >60)
Glucose, Bld: 122 mg/dL — ABNORMAL HIGH (ref 70–99)
Potassium: 4.3 mmol/L (ref 3.5–5.1)
Sodium: 139 mmol/L (ref 135–145)

## 2021-11-11 MED ORDER — FUROSEMIDE 40 MG PO TABS: ORAL_TABLET | ORAL | 3 refills | Status: DC

## 2021-11-11 NOTE — Telephone Encounter (Signed)
I spoke with Beth [name] regarding lab results and recommendations per Dr. Aundra Dubin.  She is aware and agreeable.  I will defer to other appt apparently scheduled at same time with daughter.

## 2021-11-11 NOTE — Telephone Encounter (Addendum)
Spoke with daughter she is agreeable and understands plan of care for her mother.  ----- Message from Larey Dresser, MD sent at 11/11/2021  4:37 PM EST ----- Decrease Lasix to 80 mg daily. Can take an afternoon 40 mg dose prn weight gain.  BMET 10 days.

## 2021-11-11 NOTE — Patient Instructions (Signed)
Medication Changes:  No change  Lab Work:  Labs done today, your results will be available in MyChart, we will contact you for abnormal readings.   Testing/Procedures:  Your physician has requested that you have an echocardiogram. Echocardiography is a painless test that uses sound waves to create images of your heart. It provides your doctor with information about the size and shape of your heart and how well your hearts chambers and valves are working. This procedure takes approximately one hour. There are no restrictions for this procedure.   Referrals:  none  Special Instructions // Education:  none  Follow-Up in:  3 months  At the [facility], you and your health needs are our priority. We have a designated team specialized in the treatment of Heart Failure. This Care Team includes your primary Heart Failure Specialized Cardiologist (physician), Advanced Practice Providers (APPs- Physician Assistants and Nurse Practitioners), and Pharmacist who all work together to provide you with the care you need, when you need it.   You may see any of the following providers on your designated Care Team at your next follow up:  Dr Glori Bickers Dr Haynes Kerns, NP Lyda Jester, [place] [facility] [place], [place] Audry Riles, PharmD   Please be sure to bring in all your medications bottles to every appointment.   Need to Contact us:  If you have any questions or concerns before your next appointment please send [place] a message through [employer] or call our office at [phone].    TO LEAVE A MESSAGE FOR THE NURSE SELECT OPTION 2, PLEASE LEAVE A MESSAGE INCLUDING: YOUR NAME DATE OF BIRTH CALL BACK NUMBER REASON FOR CALL**this is important as we prioritize the call backs  YOU WILL RECEIVE A CALL BACK THE SAME DAY AS LONG AS YOU CALL BEFORE 4:00 PM

## 2021-11-11 NOTE — Progress Notes (Signed)
Date:  11/11/2021   ID:  [name], DOB 01/07/1941, MRN [id number]    Provider location: [facility] Type of Visit: Established patient  PCP:  Willey Blade, MD  Cardiologist:  Dr. Aundra Dubin   History of Present Illness: Beth [name] is a 80 y.o. female who has a history of COPD on home oxygen, untreated OSA, chronic diastolic CHF/RV failure, and pulmonary hypertension.  She was admitted in 5/17 with acute on chronic diastolic CHF with prominent RV failure and marked shortness of breath.  She was diuresed with IV Lasix.  Echo showed dilated and dysfunctional RV with pulmonary hypertension.  RHC showed marked pulmonary hypertension with systemic PA pressure.  She was started on Revatio in the hospital and this was titrated up.  Subsequently, she was started on macitentan.  She next started Selexipag but she had intractable side effects with selexipag so stopped it.  I then tried her on Tyvaso, but she has developed abdominal discomfort and diarrhea with Tyvaso and has had to stop it (symptomatic even at lowest dose). She did not feel like it helped her breathing when she was on it.  She stopped sildenafil and started riociguat. However, she developed painful hand swelling on riociguat and has had to stop it.  Symptoms resolved off riociguat.     She was admitted in 8/18 with hypotension and AKI.  Valsartan and HCTZ stopped.      Echo in 11/19 showed EF 60-65%, mild AS, normal RV size and systolic function.    Echo in 12/20 showed EF 65-70%, LV mid-cavity gradient to 65 mmHg, mild AS with mean gradient 14 mmHg, normal RV size and systolic function, IVC normal, no TR jet. Echo in 1/22 showed EF 65-70%, mild LVH, mid-cavity LV gradient peak 27 mmHg, normal RV size and systolic function, normal IVC, PASP 18 mmHg, mild AS.   She returns for followup of pulmonary hypertension.  She wears 2 L [place] at all times, not able to tolerate CPAP.  Weight is up 5 lbs.  No  lightheadedness.  She fatigues easily but does not get short of breath walking around her house or walking short distances.  Dyspnea walking longer distances. No chest pain.  No orthopnea/PND.    6 minute walk (7/17): 238 m 6 minute walk (10/17): 171 m 6 minute walk (3/18): 231 m 6 minute walk (11/18): 123 m 6 minute walk (1/19): 213 m 6 minute walk (10/19): 231 m 6 minute walk (9/20): 122 m 6 minute walk (11/20): 213 m 6 minute walk (3/21): 182 m 6 minute walk (9/21): 243 m 6 minute walk (4/22): 213 m 6 minute walk (12/22): 213 m   Labs (5/17): K 4.7 => 4, creatinine 0.96 => 1.04 Labs (8/17): K 4.1, creatinine 1.5 Labs (9/17): K 3.5, creatinine 1.13 Labs (10/17): K 3.6, creatinine 1.0 Labs (1/18): K 3.9, creatinine 1.14, BNP 34 Labs (2/18): K 4.1, creatinine 1.07, BNP 25 Labs (3/18): K 3.8, creatinine 1.06, BNP 25 Labs (8/18): K 4.6, creatinine 1.38, hgb 9.1 Labs (9/18): BNP 8.7 Labs (11/18): BNP 36, K 3.6, creatinine 0.65 Labs (6/20): K 3.6, creatinine 1.17, BNP 21.6 Labs (9/20): K 3.3, creatinine 0.91, BNP 25 Labs (12/20): K 3.6, creatinine 1.06, BNP 21 Labs (3/21): K 3.6, creatinine 1.27, BNP 31 Labs (9/21): K 4, creatinine 1.29 Labs (11/21): LDL 80, HDL 89 Labs (1/22): K 3.6, creatinine 1.66, BNP 78.5 Labs (4/22): BNP 23, K 4.4, creatinine 1.53 Labs (8/22):  K 3.6, creatinine 1.26, BNP 27   PMH: 1. H/o CVA 2. DM 3. HTN 4. Hyperlipidemia 5. OSA: Cannot tolerate CPAP, has tried multiple masks.  6. COPD: She is on home oxygen. 7. SVT 8. Chronic diastolic CHF with RV failure: Echo (5/17) with EF 55%, severely dilated RV with moderately decreased systolic function, D-shaped interventricular septum.  - Echo (7/18): EF 60-65%, D-shaped interventricular septum, mild RV dilation with normal systolic function, PASP 41, IVC normal, mild MR, mild AS.  - Echo (11/19): EF 60-65%, mild AS, normal RV size and systolic function.  - Echo (12/20): EF 65-70%, LV mid-cavity gradient to  65 mmHg, mild AS with mean gradient 14 mmHg, normal RV size and systolic function, IVC normal, no TR jet.  - Echo (1/22): EF 65-70%, mild LVH, mid-cavity LV gradient peak 27 mmHg, normal RV size and systolic function, normal IVC, PASP 18 mmHg, mild AS.   9. 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  V/Q scan 5/17 with no evidence for chronic PE.  ANA weakly positive (1:80), ANCA negative, RF negative.  RHC (5/17) with mean RA 20, PA 110/45 mean 66, mean PCWP 20, CI 1.7, PVR 15 WU.  - Intractable side effects with selexipag.  - Did not tolerate Tyvaso - Did not tolerate riociguat.  10. CKD 11. Aortic stenosis: Mild.   Current Outpatient Medications  Medication Sig Dispense Refill   albuterol (PROVENTIL) (2.5 MG/3ML) 0.083% nebulizer solution Take 2.5 mg by nebulization every 6 (six) hours as needed for wheezing or shortness of breath.     albuterol (VENTOLIN HFA) 108 (90 Base) MCG/ACT inhaler INHALE 2 PUFFS INTO THE LUNGS EVERY 4 HOURS AS NEEDED ONLY IF YOU CANT CATCH YOUR BREATH 18 Inhaler 2   allopurinol (ZYLOPRIM) 300 MG tablet Take 300 mg by mouth daily.     amLODipine (NORVASC) 10 MG tablet TAKE 1 TABLET BY MOUTH EVERYDAY AT BEDTIME 90 tablet 0   Ascorbic Acid (VITAMIN C) 1000 MG tablet Take 1,000 mg by mouth daily.     ASPIRIN LOW DOSE 81 MG EC tablet TAKE 1 TABLET BY MOUTH EVERY DAY 90 tablet 3   atorvastatin (LIPITOR) 20 MG tablet Take 20 mg by mouth daily.     b complex vitamins tablet Take 1 tablet by mouth daily.     bisoprolol (ZEBETA) 10 MG tablet Take 1 tablet (10 mg total) by mouth daily. 30 tablet 11   Cholecalciferol (VITAMIN D3) 1000 units CAPS Take 1,000 Units by mouth daily.     clobetasol cream (TEMOVATE) 6.31 % Apply 1 application topically as needed.      co-enzyme Q-10 30 MG capsule Take 30 mg by mouth daily.     ezetimibe (ZETIA) 10 MG tablet TAKE 1 TABLET BY MOUTH EVERY  DAY 90 tablet 3   KLOR-CON M20 20 MEQ tablet TAKE 3 TABLETS (60 MEQ TOTAL) BY MOUTH DAILY. 270 tablet 3   loratadine (CLARITIN) 10 MG tablet Take 10 mg by mouth as needed.      macitentan (OPSUMIT) 10 MG tablet Take 1 tablet (10 mg total) by mouth daily. 30 tablet 11   meloxicam (MOBIC) 15 MG tablet Take 1 tablet by mouth as needed.     metFORMIN (GLUCOPHAGE) 500 MG tablet Take 500 mg by mouth 2 (two) times daily with a meal.     Omega 3 1000 MG CAPS Take 1 capsule by mouth daily.  omeprazole (PRILOSEC) 10 MG capsule Take 10 mg by mouth daily.     OXYGEN Inhale 2 L into the lungs continuous.     sildenafil (REVATIO) 20 MG tablet TAKE 4 TABLETS (80 MG TOTAL) BY MOUTH 3 (THREE) TIMES DAILY. 1080 tablet 3   furosemide (LASIX) 40 MG tablet TAKE 2 TABLETS BY MOUTH EVERY MORNING.  Can take extra dose every other afternoon with weight gain. 270 tablet 3   TRELEGY ELLIPTA 100-62.5-25 MCG/INH AEPB TAKE 1 PUFF BY MOUTH EVERY DAY 180 each 4   No current facility-administered medications for this encounter.    Allergies:   Aldomet [methyldopa] and Tyvaso [treprostinil]   Social History:  The patient  reports that she quit smoking about 16 years ago. Her smoking use included cigarettes. She has a 60.00 pack-year smoking history. She has quit using smokeless tobacco. She reports that she does not drink alcohol and does not use drugs.   Family History:  The patient's family history includes Heart disease in her brother, brother, mother, sister, and sister; Heart failure in her father and mother.   ROS:  Please see the history of present illness.   All other systems are personally reviewed and negative.   Exam:   BP 130/76    Pulse 63    Wt 54 kg (119 lb)    SpO2 99%    BMI 21.77 kg/m  General: NAD Neck: No JVD, no thyromegaly or thyroid nodule.  Lungs: Clear to auscultation bilaterally with normal respiratory effort. CV: Nondisplaced PMI.  Heart regular S1/S2, no S3/S4, 1/6 SEM RUSB.  No peripheral  edema.  No carotid bruit.  Normal pedal pulses.  Abdomen: Soft, nontender, no hepatosplenomegaly, no distention.  Skin: Intact without lesions or rashes.  Neurologic: Alert and oriented x 3.  Psych: Normal affect. Extremities: No clubbing or cyanosis.  HEENT: Normal.   Recent Labs: 12/05/2020: Magnesium 1.9 11/11/2021: B Natriuretic Peptide 124.2; BUN 61; Creatinine, Ser 1.67; Potassium 4.3; Sodium 139  Personally reviewed   Wt Readings from Last 3 Encounters:  11/11/21 54 kg (119 lb)  09/29/21 53.2 kg (117 lb 3.2 oz)  07/11/21 51.7 kg (114 lb)    ASSESSMENT AND PLAN:  1. 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  She was very short of breath with exertion and had very elevated right-sided filling pressures on 5/17 RHC.  PA pressure was systemic. Cardiac output low, but not moving towards IV [place]olan given the mixed etiology of her PH.  V/Q scan not suggestive of chronic PEs and autoimmune serologies sent (RF negative, ANCA negative, ANA only weakly positive (1:80). She has been unable to tolerate selexipag, Tyvaso, or riociguat. Echo in 1/22 showed normal RV size and systolic function and estimation of PA pressure, surprisingly, was not significantly elevated.  NYHA class II, no changes in symptoms.  Stable 6 minute walk today.  - Continue sildenafil 80 mg tid.  - Continue Opsumit 10 mg daily.  - Check BNP.  2. COPD: On home oxygen.  Prior long-time smoker. No change.   3. Chronic diastolic CHF with prominent RV failure: Echo in 1/22 with RV function improved, appears normal.  Echo in 12/20 showed a mid-cavity gradient with vigorous LV contraction, 1/22 echo showed a lower mid-cavity gradient. NYHA class II, she is not volume overloaded today.  - Continue Lasix 80 qam, 40 every other pm.  BMET today.  - Continue bisoprolol 10  mg daily (beta-1 selective beta blocker with COPD) with mid-cavity  gradient and HTN.  - Repeat echo at next appt in 3 months.  4. SVT: Denies palpitations.  5. OSA: Has been unable to tolerate CPAP.  She uses home oxygen.  OSA may have small contribution to [facility] but does not explain the extent of PAH.  6. Hyperlipidemia - Continue statin.  7. HTN: BP controlled.    8. Aortic stenosis: Mild.   Followup in 3 months with echo.   Signed, Loralie Champagne, MD  11/11/2021  [facility] [address] [facility] [place] [address] [phone] (office) [phone] (fax)

## 2021-11-11 NOTE — Telephone Encounter (Signed)
-----  Message from Larey Dresser, MD sent at 11/11/2021  4:37 PM EST ----- Decrease Lasix to 80 mg daily. Can take an afternoon 40 mg dose prn weight gain.  BMET 10 days.

## 2021-11-11 NOTE — Progress Notes (Signed)
Order for Furosemide updated per Dr. Aundra Dubin.

## 2021-11-11 NOTE — Progress Notes (Signed)
6 Minute Walk Test Patient walked approximately 700 feet with oxygen saturations between 96-90% on 2L oxygen via nasal cannula and heartrate between 67-90 BPM.  She tolerated walk very well pausing 2 times to rest for a short time.

## 2021-11-21 ENCOUNTER — Other Ambulatory Visit (HOSPITAL_COMMUNITY): Payer: Medicare HMO

## 2021-11-25 ENCOUNTER — Other Ambulatory Visit: Payer: Self-pay

## 2021-11-25 ENCOUNTER — Ambulatory Visit (HOSPITAL_COMMUNITY)
Admission: RE | Admit: 2021-11-25 | Discharge: 2021-11-25 | Disposition: A | Discharge status: Home / Self Care | Payer: Medicare HMO | Source: Ambulatory Visit | Attending: Cardiology | Admitting: Cardiology

## 2021-11-25 DIAGNOSIS — I5032 Chronic diastolic (congestive) heart failure: Secondary | ICD-10-CM

## 2021-11-25 LAB — BASIC METABOLIC PANEL
Anion gap: 14 (ref 5–15)
BUN: 49 mg/dL — ABNORMAL HIGH (ref 8–23)
CO2: 25 mmol/L (ref 22–32)
Calcium: 9.6 mg/dL (ref 8.9–10.3)
Chloride: 102 mmol/L (ref 98–111)
Creatinine, Ser: 1.47 mg/dL — ABNORMAL HIGH (ref 0.44–1.00)
GFR, Estimated: 36 mL/min — ABNORMAL LOW (ref >60)
Glucose, Bld: 192 mg/dL — ABNORMAL HIGH (ref 70–99)
Potassium: 4.3 mmol/L (ref 3.5–5.1)
Sodium: 141 mmol/L (ref 135–145)

## 2021-12-01 ENCOUNTER — Telehealth (HOSPITAL_COMMUNITY): Payer: Self-pay | Admitting: Pharmacy Technician

## 2021-12-01 NOTE — Telephone Encounter (Signed)
Advanced Heart Failure Patient Advocate Encounter  Patient was approved for a TAF grant that will pay for Opsumit and Sildenafil through the remainder of the year.    Member Number [id number]  Group Number [id number] Coverage from date 11/16/2021 Coverage to date 11/15/2022 Claims Processing [place] PCN: [id number] BIN: [id number] Processing: 08 Phone: [phone] Fax: [phone]  Sent 30 day RX request to [facility] ([place] to send to [place].   Charlann Boxer, CPhT

## 2021-12-15 ENCOUNTER — Other Ambulatory Visit: Payer: Self-pay | Admitting: Internal Medicine

## 2021-12-15 DIAGNOSIS — R1900 Intra-abdominal and pelvic swelling, mass and lump, unspecified site: Secondary | ICD-10-CM

## 2021-12-24 ENCOUNTER — Other Ambulatory Visit: Payer: Self-pay

## 2021-12-24 ENCOUNTER — Ambulatory Visit
Admission: RE | Admit: 2021-12-24 | Discharge: 2021-12-24 | Disposition: A | Discharge status: Home / Self Care | Payer: Medicare HMO | Source: Ambulatory Visit | Attending: Internal Medicine | Admitting: Internal Medicine

## 2021-12-24 ENCOUNTER — Other Ambulatory Visit: Payer: Self-pay | Admitting: Internal Medicine

## 2021-12-24 DIAGNOSIS — R1900 Intra-abdominal and pelvic swelling, mass and lump, unspecified site: Secondary | ICD-10-CM

## 2021-12-27 ENCOUNTER — Other Ambulatory Visit (HOSPITAL_COMMUNITY): Payer: Self-pay | Admitting: Cardiology

## 2022-02-10 ENCOUNTER — Ambulatory Visit (HOSPITAL_COMMUNITY)
Admission: RE | Admit: 2022-02-10 | Discharge: 2022-02-10 | Disposition: A | Discharge status: Home / Self Care | Payer: Medicare HMO | Source: Ambulatory Visit | Attending: Cardiology | Admitting: Cardiology

## 2022-02-10 ENCOUNTER — Other Ambulatory Visit: Payer: Self-pay

## 2022-02-10 ENCOUNTER — Ambulatory Visit (HOSPITAL_COMMUNITY): Admission: RE | Admit: 2022-02-10 | Payer: Medicare HMO | Source: Ambulatory Visit

## 2022-02-10 ENCOUNTER — Encounter (HOSPITAL_COMMUNITY): Payer: Self-pay | Admitting: Cardiology

## 2022-02-10 VITALS — BP 140/62 | HR 70 | Wt 119.8 lb

## 2022-02-10 DIAGNOSIS — I272 Pulmonary hypertension, unspecified: Secondary | ICD-10-CM | POA: Insufficient documentation

## 2022-02-10 DIAGNOSIS — N189 Chronic kidney disease, unspecified: Secondary | ICD-10-CM | POA: Insufficient documentation

## 2022-02-10 DIAGNOSIS — G4733 Obstructive sleep apnea (adult) (pediatric): Secondary | ICD-10-CM | POA: Insufficient documentation

## 2022-02-10 DIAGNOSIS — Z8249 Family history of ischemic heart disease and other diseases of the circulatory system: Secondary | ICD-10-CM | POA: Diagnosis not present

## 2022-02-10 DIAGNOSIS — J449 Chronic obstructive pulmonary disease, unspecified: Secondary | ICD-10-CM | POA: Insufficient documentation

## 2022-02-10 DIAGNOSIS — I471 Supraventricular tachycardia: Secondary | ICD-10-CM | POA: Insufficient documentation

## 2022-02-10 DIAGNOSIS — Z87891 Personal history of nicotine dependence: Secondary | ICD-10-CM | POA: Diagnosis not present

## 2022-02-10 DIAGNOSIS — E785 Hyperlipidemia, unspecified: Secondary | ICD-10-CM | POA: Diagnosis not present

## 2022-02-10 DIAGNOSIS — I350 Nonrheumatic aortic (valve) stenosis: Secondary | ICD-10-CM | POA: Insufficient documentation

## 2022-02-10 DIAGNOSIS — Z9981 Dependence on supplemental oxygen: Secondary | ICD-10-CM | POA: Insufficient documentation

## 2022-02-10 DIAGNOSIS — I130 Hypertensive heart and chronic kidney disease with heart failure and stage 1 through stage 4 chronic kidney disease, or unspecified chronic kidney disease: Secondary | ICD-10-CM | POA: Insufficient documentation

## 2022-02-10 DIAGNOSIS — Z79899 Other long term (current) drug therapy: Secondary | ICD-10-CM | POA: Diagnosis not present

## 2022-02-10 DIAGNOSIS — I5032 Chronic diastolic (congestive) heart failure: Secondary | ICD-10-CM | POA: Diagnosis not present

## 2022-02-10 LAB — BASIC METABOLIC PANEL
Anion gap: 11 (ref 5–15)
BUN: 49 mg/dL — ABNORMAL HIGH (ref 8–23)
CO2: 30 mmol/L (ref 22–32)
Calcium: 9.6 mg/dL (ref 8.9–10.3)
Chloride: 98 mmol/L (ref 98–111)
Creatinine, Ser: 1.35 mg/dL — ABNORMAL HIGH (ref 0.44–1.00)
GFR, Estimated: 39 mL/min — ABNORMAL LOW (ref >60)
Glucose, Bld: 132 mg/dL — ABNORMAL HIGH (ref 70–99)
Potassium: 4.1 mmol/L (ref 3.5–5.1)
Sodium: 139 mmol/L (ref 135–145)

## 2022-02-10 LAB — LIPID PANEL
Cholesterol: 177 mg/dL (ref 0–200)
HDL: 98 mg/dL (ref >40)
LDL Cholesterol: 73 mg/dL (ref 0–99)
Total CHOL/HDL Ratio: 1.8 RATIO
Triglycerides: 32 mg/dL (ref <150)
VLDL: 6 mg/dL (ref 0–40)

## 2022-02-10 LAB — BRAIN NATRIURETIC PEPTIDE: B Natriuretic Peptide: 88.4 pg/mL (ref 0.0–100.0)

## 2022-02-10 NOTE — Progress Notes (Signed)
6 Min Walk Test Completed ? ?Pt ambulated 243.8 meters ?O2 Sat ranged 96-99% on 2 L oxygen ?HR ranged 65-91 ? ?

## 2022-02-10 NOTE — Patient Instructions (Signed)
There has been no changes to your medications. ? ?Labs done today, your results will be available in MyChart, we will contact you for abnormal readings. ? ?Your physician has requested that you have an echocardiogram. Echocardiography is a painless test that uses sound waves to create images of your heart. It provides your doctor with information about the size and shape of your heart and how well your heart?s chambers and valves are working. This procedure takes approximately one hour. There are no restrictions for this procedure. ? ?Your physician recommends that you schedule a follow-up appointment in: 4 months (July 2023) ** please call the office in May to arrange your follow up appointment** ? ? ?If you have any questions or concerns before your next appointment please send [place] a message through [place] or call our office at [phone].   ? ?TO LEAVE A MESSAGE FOR THE NURSE SELECT OPTION 2, PLEASE LEAVE A MESSAGE INCLUDING: ?YOUR NAME ?DATE OF BIRTH ?CALL BACK NUMBER ?REASON FOR CALL**this is important as we prioritize the call backs ? ?YOU WILL RECEIVE A CALL BACK THE SAME DAY AS LONG AS YOU CALL BEFORE 4:00 PM ? ?At the [facility], you and your health needs are our priority. As part of our continuing mission to provide you with exceptional heart care, we have created designated Provider Care Teams. These Care Teams include your primary Cardiologist (physician) and Advanced Practice Providers (APPs- Physician Assistants and Nurse Practitioners) who all work together to provide you with the care you need, when you need it.  ? ?You may see any of the following providers on your designated Care Team at your next follow up: ?Dr Glori Bickers ?Dr Loralie Champagne ?Darrick Grinder, NP ?Lyda Jester, PA ?Jessica Milford,NP ?Marlyce Huge, PA ?Audry Riles, PharmD ? ? ?Please be sure to bring in all your medications bottles to every appointment.  ? ? ?

## 2022-02-10 NOTE — Progress Notes (Signed)
?  ? ? ?Date:  02/10/2022  ? ?ID:  Beth [name], DOB 1940-12-04, MRN [id number]    ?Provider location: [facility] ?Type of Visit: Established patient ? ?PCP:  Willey Blade, MD  ?Cardiologist:  Dr. Aundra Dubin ?  ?History of Present Illness: ?Beth [name] is a 81 y.o. female who has a history of COPD on home oxygen, untreated OSA, chronic diastolic CHF/RV failure, and pulmonary hypertension.  She was admitted in 5/17 with acute on chronic diastolic CHF with prominent RV failure and marked shortness of breath.  She was diuresed with IV Lasix.  Echo showed dilated and dysfunctional RV with pulmonary hypertension.  RHC showed marked pulmonary hypertension with systemic PA pressure.  She was started on Revatio in the hospital and this was titrated up.  Subsequently, she was started on macitentan.  She next started Selexipag but she had intractable side effects with selexipag so stopped it.  I then tried her on Tyvaso, but she has developed abdominal discomfort and diarrhea with Tyvaso and has had to stop it (symptomatic even at lowest dose). She did not feel like it helped her breathing when she was on it.  She stopped sildenafil and started riociguat. However, she developed painful hand swelling on riociguat and has had to stop it.  Symptoms resolved off riociguat.   ?  ?She was admitted in 8/18 with hypotension and AKI.  Valsartan and HCTZ stopped.    ?  ?Echo in 11/19 showed EF 60-65%, mild AS, normal RV size and systolic function.   ? ?Echo in 12/20 showed EF 65-70%, LV mid-cavity gradient to 65 mmHg, mild AS with mean gradient 14 mmHg, normal RV size and systolic function, IVC normal, no TR jet. Echo in 1/22 showed EF 65-70%, mild LVH, mid-cavity LV gradient peak 27 mmHg, normal RV size and systolic function, normal IVC, PASP 18 mmHg, mild AS.  ? ?She returns for followup of pulmonary hypertension.  She wears 2 L [place] at all times, not able to tolerate CPAP.  Weight is stable.  No  lightheadedness or syncope. No dyspnea walking in house.  Mild dyspnea walking longer distances.  No orthopnea/PND.  No chest pain.  No lightheadedness.   ?  ?6 minute walk (7/17): 238 m ?6 minute walk (10/17): 171 m ?6 minute walk (3/18): 231 m ?6 minute walk (11/18): 123 m ?6 minute walk (1/19): 213 m ?6 minute walk (10/19): 231 m ?6 minute walk (9/20): 122 m ?6 minute walk (11/20): 213 m ?6 minute walk (3/21): 182 m ?6 minute walk (9/21): 243 m ?6 minute walk (4/22): 213 m ?6 minute walk (12/22): 213 m ?6 minute walk (3/23): 244 m ?  ?Labs (5/17): K 4.7 => 4, creatinine 0.96 => 1.04 ?Labs (8/17): K 4.1, creatinine 1.5 ?Labs (9/17): K 3.5, creatinine 1.13 ?Labs (10/17): K 3.6, creatinine 1.0 ?Labs (1/18): K 3.9, creatinine 1.14, BNP 34 ?Labs (2/18): K 4.1, creatinine 1.07, BNP 25 ?Labs (3/18): K 3.8, creatinine 1.06, BNP 25 ?Labs (8/18): K 4.6, creatinine 1.38, hgb 9.1 ?Labs (9/18): BNP 8.7 ?Labs (11/18): BNP 36, K 3.6, creatinine 0.65 ?Labs (6/20): K 3.6, creatinine 1.17, BNP 21.6 ?Labs (9/20): K 3.3, creatinine 0.91, BNP 25 ?Labs (12/20): K 3.6, creatinine 1.06, BNP 21 ?Labs (3/21): K 3.6, creatinine 1.27, BNP 31 ?Labs (9/21): K 4, creatinine 1.29 ?Labs (11/21): LDL 80, HDL 89 ?Labs (1/22): K 3.6, creatinine 1.66, BNP 78.5 ?Labs (4/22): BNP 23, K 4.4, creatinine 1.53 ?Labs (8/22): K 3.6,  creatinine 1.26, BNP 27 ?Labs (12/22): BNP 124 ?Labs (1/23): K 4.3, creatinine 1.47 ?  ?PMH: ?1. H/o CVA ?2. DM ?3. HTN ?4. Hyperlipidemia ?5. OSA: Cannot tolerate CPAP, has tried multiple masks.  ?6. COPD: She is on home oxygen. ?7. SVT ?8. Chronic diastolic CHF with RV failure: Echo (5/17) with EF 55%, severely dilated RV with moderately decreased systolic function, D-shaped interventricular septum.  ?- Echo (7/18): EF 60-65%, D-shaped interventricular septum, mild RV dilation with normal systolic function, PASP 41, IVC normal, mild MR, mild AS.  ?- Echo (11/19): EF 60-65%, mild AS, normal RV size and systolic function.  ?-  Echo (12/20): EF 65-70%, LV mid-cavity gradient to 65 mmHg, mild AS with mean gradient 14 mmHg, normal RV size and systolic function, IVC normal, no TR jet.  ?- Echo (1/22): EF 65-70%, mild LVH, mid-cavity LV gradient peak 27 mmHg, normal RV size and systolic function, normal IVC, PASP 18 mmHg, mild AS.   ?9. 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  V/Q scan 5/17 with no evidence for chronic PE.  ANA weakly positive (1:80), ANCA negative, RF negative.  RHC (5/17) with mean RA 20, PA 110/45 mean 66, mean PCWP 20, CI 1.7, PVR 15 WU.  ?- Intractable side effects with selexipag.  ?- Did not tolerate Tyvaso ?- Did not tolerate riociguat.  ?10. CKD ?11. Aortic stenosis: Mild.  ? ?Current Outpatient Medications  ?Medication Sig Dispense Refill  ? albuterol (PROVENTIL) (2.5 MG/3ML) 0.083% nebulizer solution Take 2.5 mg by nebulization every 6 (six) hours as needed for wheezing or shortness of breath.    ? albuterol (VENTOLIN HFA) 108 (90 Base) MCG/ACT inhaler INHALE 2 PUFFS INTO THE LUNGS EVERY 4 HOURS AS NEEDED ONLY IF YOU CANT CATCH YOUR BREATH 18 Inhaler 2  ? allopurinol (ZYLOPRIM) 300 MG tablet Take 300 mg by mouth daily.    ? amLODipine (NORVASC) 10 MG tablet TAKE 1 TABLET BY MOUTH EVERYDAY AT BEDTIME 90 tablet 0  ? Ascorbic Acid (VITAMIN C) 1000 MG tablet Take 1,000 mg by mouth daily.    ? ASPIRIN LOW DOSE 81 MG EC tablet TAKE 1 TABLET BY MOUTH EVERY DAY 90 tablet 3  ? atorvastatin (LIPITOR) 20 MG tablet Take 10 mg by mouth daily.    ? b complex vitamins tablet Take 1 tablet by mouth daily.    ? bisoprolol (ZEBETA) 10 MG tablet Take 1 tablet (10 mg total) by mouth daily. 30 tablet 11  ? Cholecalciferol (VITAMIN D3) 1000 units CAPS Take 1,000 Units by mouth daily.    ? clobetasol cream (TEMOVATE) 6.26 % Apply 1 application topically as needed.     ? co-enzyme Q-10 30 MG capsule Take 30 mg by mouth daily.    ? ezetimibe  (ZETIA) 10 MG tablet TAKE 1 TABLET BY MOUTH EVERY DAY 90 tablet 3  ? furosemide (LASIX) 40 MG tablet TAKE 2 TABLETS BY MOUTH EVERY MORNING.  Can take extra dose every other afternoon with weight gain. 270 tablet 3  ? KLOR-CON M20 20 MEQ tablet TAKE 3 TABLETS (60 MEQ TOTAL) BY MOUTH DAILY. 270 tablet 3  ? loratadine (CLARITIN) 10 MG tablet Take 10 mg by mouth as needed.     ? macitentan (OPSUMIT) 10 MG tablet Take 1 tablet (10 mg total) by mouth daily. 30 tablet 11  ? meloxicam (MOBIC) 15 MG tablet Take 1 tablet by mouth as needed.    ?  metFORMIN (GLUCOPHAGE) 500 MG tablet Take 500 mg by mouth 2 (two) times daily with a meal.    ? Omega 3 1000 MG CAPS Take 1 capsule by mouth daily.    ? omeprazole (PRILOSEC) 10 MG capsule Take 10 mg by mouth daily.    ? OXYGEN Inhale 2 L into the lungs continuous.    ? sildenafil (REVATIO) 20 MG tablet TAKE 4 TABLETS (80 MG TOTAL) BY MOUTH 3 (THREE) TIMES DAILY. 1080 tablet 3  ? TRELEGY ELLIPTA 100-62.5-25 MCG/INH AEPB TAKE 1 PUFF BY MOUTH EVERY DAY 180 each 4  ? ?No current facility-administered medications for this encounter.  ? ? ?Allergies:   Aldomet [methyldopa] and Tyvaso [treprostinil]  ? ?Social History:  The patient  reports that she quit smoking about 17 years ago. Her smoking use included cigarettes. She has a 60.00 pack-year smoking history. She has quit using smokeless tobacco. She reports that she does not drink alcohol and does not use drugs.  ? ?Family History:  The patient's family history includes Heart disease in her brother, brother, mother, sister, and sister; Heart failure in her father and mother.  ? ?ROS:  Please see the history of present illness.   All other systems are personally reviewed and negative.  ? ?Exam:   ?BP 140/62   Pulse 70   Wt 54.3 kg (119 lb 12.8 oz)   SpO2 100% Comment: 2l n/c  BMI 21.91 kg/m?  ?General: NAD ?Neck: JVP 8 cm, no thyromegaly or thyroid nodule.  ?Lungs: Clear to auscultation bilaterally with normal respiratory effort. ?CV:  Nondisplaced PMI.  Heart regular S1/S2, no S3/S4, 2/6 early SEM RUSB.  No peripheral edema.  No carotid bruit.  Normal pedal pulses.  ?Abdomen: Soft, nontender, no hepatosplenomegaly, no distention.  ?Skin: Intact wit

## 2022-02-25 ENCOUNTER — Ambulatory Visit (HOSPITAL_COMMUNITY)
Admission: RE | Admit: 2022-02-25 | Discharge: 2022-02-25 | Disposition: A | Discharge status: Home / Self Care | Payer: Medicare HMO | Source: Ambulatory Visit | Attending: Cardiology | Admitting: Cardiology

## 2022-02-25 DIAGNOSIS — I5032 Chronic diastolic (congestive) heart failure: Secondary | ICD-10-CM | POA: Insufficient documentation

## 2022-02-25 DIAGNOSIS — J449 Chronic obstructive pulmonary disease, unspecified: Secondary | ICD-10-CM | POA: Insufficient documentation

## 2022-02-25 DIAGNOSIS — I340 Nonrheumatic mitral (valve) insufficiency: Secondary | ICD-10-CM | POA: Diagnosis not present

## 2022-02-25 DIAGNOSIS — E119 Type 2 diabetes mellitus without complications: Secondary | ICD-10-CM | POA: Insufficient documentation

## 2022-02-25 LAB — ECHOCARDIOGRAM COMPLETE
AR max vel: 2.15 cm2
AV Area VTI: 1.8 cm2
AV Area mean vel: 2 cm2
AV Mean grad: 10 mmHg
AV Peak grad: 21.3 mmHg
Ao pk vel: 2.31 m/s
Area-P 1/2: 2.91 cm2
S' Lateral: 2.2 cm

## 2022-03-11 ENCOUNTER — Other Ambulatory Visit (HOSPITAL_COMMUNITY): Payer: Self-pay

## 2022-03-11 ENCOUNTER — Other Ambulatory Visit (HOSPITAL_COMMUNITY): Payer: Self-pay | Admitting: *Deleted

## 2022-03-11 ENCOUNTER — Telehealth (HOSPITAL_COMMUNITY): Payer: Self-pay | Admitting: Pharmacy Technician

## 2022-03-11 MED ORDER — SILDENAFIL CITRATE 20 MG PO TABS: 80.0000 mg | ORAL_TABLET | Freq: Three times a day (TID) | ORAL | 11 refills | Status: DC

## 2022-03-11 NOTE — Telephone Encounter (Signed)
Advanced Heart Failure Patient Advocate Encounter ? ?Received a PA request for Sildenafil for this patient. Upon further review, patient's insurance will pay for a 30 day supply instead of 90 days. Sent 30 day RX request to [facility] to send to [facility].  ? ?Charlann Boxer, CPhT ? ?

## 2022-03-17 ENCOUNTER — Other Ambulatory Visit (HOSPITAL_COMMUNITY): Payer: Self-pay | Admitting: Cardiology

## 2022-04-01 ENCOUNTER — Other Ambulatory Visit (HOSPITAL_COMMUNITY): Payer: Self-pay

## 2022-04-01 ENCOUNTER — Telehealth (HOSPITAL_COMMUNITY): Payer: Self-pay | Admitting: Pharmacy Technician

## 2022-04-01 NOTE — Telephone Encounter (Signed)
Patient Advocate Encounter ?  ?Received notification from [facility] that prior authorization for Sildenafil is required. ?  ?PA submitted on CoverMyMeds ?Key [id number] ?Status is pending ?  ?Will continue to follow. ? ?

## 2022-04-02 NOTE — Telephone Encounter (Signed)
Advanced Heart Failure Patient Advocate Encounter  Prior Authorization for Sildenafil has been approved.    PA# [id number] Effective dates: 11/16/21 through 11/15/22  Charlann Boxer, CPhT

## 2022-05-08 ENCOUNTER — Other Ambulatory Visit (HOSPITAL_COMMUNITY): Payer: Self-pay | Admitting: Cardiology

## 2022-05-10 ENCOUNTER — Other Ambulatory Visit (HOSPITAL_COMMUNITY): Payer: Self-pay | Admitting: Cardiology

## 2022-07-14 ENCOUNTER — Other Ambulatory Visit (HOSPITAL_COMMUNITY): Payer: Self-pay | Admitting: Cardiology

## 2022-07-21 ENCOUNTER — Other Ambulatory Visit: Payer: Self-pay | Admitting: Internal Medicine

## 2022-08-17 ENCOUNTER — Other Ambulatory Visit (HOSPITAL_COMMUNITY): Payer: Self-pay

## 2022-08-17 DIAGNOSIS — J810 Acute pulmonary edema: Secondary | ICD-10-CM

## 2022-08-17 MED ORDER — OPSUMIT 10 MG PO TABS: 10.0000 mg | ORAL_TABLET | Freq: Every day | ORAL | 5 refills | Status: DC

## 2022-08-20 ENCOUNTER — Other Ambulatory Visit (HOSPITAL_COMMUNITY): Payer: Self-pay | Admitting: Cardiology

## 2022-08-20 DIAGNOSIS — I509 Heart failure, unspecified: Secondary | ICD-10-CM

## 2022-08-26 ENCOUNTER — Other Ambulatory Visit (HOSPITAL_COMMUNITY): Payer: Self-pay

## 2022-08-26 DIAGNOSIS — J810 Acute pulmonary edema: Secondary | ICD-10-CM

## 2022-08-26 MED ORDER — OPSUMIT 10 MG PO TABS: 10.0000 mg | ORAL_TABLET | Freq: Every day | ORAL | 5 refills | Status: DC

## 2022-09-13 ENCOUNTER — Other Ambulatory Visit (HOSPITAL_COMMUNITY): Payer: Self-pay | Admitting: Cardiology

## 2022-09-29 ENCOUNTER — Ambulatory Visit: Payer: Medicare HMO | Admitting: Internal Medicine

## 2022-09-29 NOTE — Progress Notes (Deleted)
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 Some worse in spring and ? Summer with hot humid weather with subjective wheeze while on spiriva x 2 years then advair 7/13 and prednisone has helped to point where need neb less "only  3 x day" and puffer 3-4 times as well. Assoc with voice change/ hoarsenss, ear pain.   No unusual cough, purulent sputum or sinus/hb symptoms on present rx. rec Work on inhaler technique:   Only use your albuterol as a rescue medication (proaire is Plan B,  Nebulizer is C) Add pepcid 20 mg one every night at bedtime GERD diet office visit in 4 weeks, sooner if needed with PFTs> GOLD III criteria > symb 160 2bid prn saba        Admit date: 06/22/2017 Discharge date: 06/24/2017   Brief/Interim Summary: For complete details see H&P but in brief. Beth [name] is a 81 year old female with medical history of hypertension, hyperlipidemia, diabetes mellitus, COPD on 2 L oxygen at home, asthma, GERD, diastolic CHF, pulmonary hypertension, chronic kidney disease stage III who presented with confusion and slurred speech. Patient daughter reported that patient became confused and noted mild slurred speech but no unilateral weakness. Upon ED evaluation patient was already alert and oriented 3 with no weakness or slurred speech. She was found to be hypotensive with blood pressure of 79/42 which improved after IV fluid bolus. CT scan of the head did not show any acute abnormalities and she was placed on observation for further evaluation. MRI of the head was done with negative results. On blood work up her creatinine was  found to be slight worsening and this was treated with IV fluids. Patient clinically improved, was elevated by physical therapy who recommended PT/OT at home. Blood pressure has remained stable and no further neurologic deficit has been noted.   Subjective: Patient seen and examined on the day of discharge she reports doing much better than when she was admitted. Has no complaint. Her breathing is at baseline. Denies any weakness, dizziness, chest pain, palpitations and blurry vision. Patient remains afebrile, tolerating diet well. Kidney function has improved   Discharge Diagnoses/Hospital Course:  Hypotension Multifactorial, patient on multiple blood pressure lowering agents, diuretics and medications for pulmonary hypertension. She was treated with IV fluids which improved blood pressure. During hospital stay, home medications were held, and blood pressure went above goal with IV fluids. Macitentan and Riociguat were resume and blood pressure up on discharge was more stable. Valsartan and HCTZ were held and will continue to be on hold until patient is seen by cardiology or primary care physician.   Acute metabolic encephalopathy Hypotension and dehydration may have contributed to her AMS, although would not explain slurred speech. Workup so far reassuring as MRI and CT of the head not show any acute abnormality. Patient is back to baseline, she is alert and oriented 3 and her speech is clear. Will defer to PCP if further workup is necessary.   Acute renal failure on chronic kidney disease Baseline creatinine is 1.0, pulmonary admission serum  creatinine was 2.0. Highly secondary to dehydration and continuation of ARB is, diuretics and NSAIDs. Patient also hypotensive ATN maybe a component. Serum creatinine back to baseline, improving with IV fluids. Patient was encouraged to follow-up with primary care physician. And encouraged to keep good oral hydration.    COPD - stable No changes in  medication were made during hospital stay   Chronic diastolic heart failure Patient was dry during hospital stay. She was given IV fluid therefore will resume Lasix upon discharge. Follow-up with cardiology   Hypertension Patient was admitted due to hypotension Valsartan and HCTZ were placed on hold, and will continue to be on hold until patient is evaluated by cardiology or primary care physician. Patient on medications for pulmonary hypertension which have fairly significant effect on lowering BP.    Pulmonary hypertension Continue Macitentan and Riociguat     12/27/2017  f/u ov/Wilkin Lippy re:  GOLD III/ 02  19/24 2lpm  With cor pulmonale  Chief Complaint  Patient presents with   Follow-up    Breathing has improved. She states she has not had to use her albuterol inhaler or neb.   Dyspnea:   MMRC3 = can't walk 100 yards even at a slow pace at a flat grade s stopping due to sob on 2lpm  Cough: no Sleep: ok on 2lpm one pillows  rec Plan A = Automatic = Trelegy one click each am    Plan B = Backup Only use your albuterol as a rescue medication Plan C = Crisis - only use your albuterol nebulizer if you first try Plan B and it fails to help > ok to use the nebulizer up to every 4 hours but if start needing it regularly call for immediate appointment Please schedule a follow up visit in 6  months but call sooner if needed - PLEASE BRING ALL INHALERS/SOLUTIONS WITH YOU ON RETURN    09/29/2021  f/u ov/Chaz Ronning re: GOLD 3 plus 02 dep maint on trelegy  Chief Complaint  Patient presents with   Follow-up   COPD  Dyspnea:  [employer] shopping /neighborhood  Cough: none  Sleeping: bed is flat / one pillow  SABA use: none  02: 2lpm 24/7  Covid status:   vax x 3  Rec No change rx   09/29/2022  f/u ov/Aerilynn Goin re: ***   maint on ***  No chief complaint on file.   Dyspnea:  *** Cough: *** Sleeping: *** SABA use: *** 02: *** Covid status:   *** Lung cancer screening :  ***    No obvious  day to day or daytime variability or assoc excess/ purulent sputum or mucus plugs or hemoptysis or cp or chest tightness, subjective wheeze or overt sinus or hb symptoms.   *** without nocturnal  or early am exacerbation  of respiratory  c/o's or need for noct saba. Also denies any obvious fluctuation of symptoms with weather or environmental changes or other aggravating or alleviating factors except as outlined above   No unusual exposure hx or h/o childhood pna/ asthma or knowledge of premature birth.  Current Allergies, Complete Past Medical History, Past Surgical History, Family History, and Social History were reviewed in [employer] record.  ROS  The following are not active complaints unless bolded Hoarseness, sore throat, dysphagia, dental problems, itching, sneezing,  nasal congestion or discharge of excess mucus or purulent secretions, ear ache,   fever, chills, sweats, unintended wt loss or wt gain, classically pleuritic or exertional cp,  orthopnea  pnd or arm/hand swelling  or leg swelling, presyncope, palpitations, abdominal pain, anorexia, nausea, vomiting, diarrhea  or change in bowel habits or change in bladder habits, change in stools or change in urine, dysuria, hematuria,  rash, arthralgias, visual complaints, headache, numbness, weakness or ataxia or problems with walking or coordination,  change in mood or  memory.        No outpatient medications have been marked as taking for the 09/29/22 encounter (Appointment) with Tanda Rockers, MD.                 Objective:   Physical Exam  09/29/2022       ***  09/29/2021      117  03/15/2020        117  07/12/2019        127  01/02/2019        124  06/28/2018       128  12/27/2017       125   07/16/17 131 lb 12.8 oz (59.8 kg)  06/07/17 137 lb 4 oz (62.3 kg)  04/13/17 143 lb 8 oz (65.1 kg)   Vital signs reviewed  09/29/2022  - Note at rest 02 sats  ***% on ***   General appearance:    ***   Top  dentures reported     Mild bar***                      Assessment & Plan:

## 2022-10-27 ENCOUNTER — Telehealth (HOSPITAL_COMMUNITY): Payer: Self-pay | Admitting: Pharmacist

## 2022-10-27 NOTE — Telephone Encounter (Signed)
Patient Advocate Encounter   Received notification from [facility] that prior authorization for sildenafil and Opsumit are required.   PA submitted on CoverMyMeds Keys:  [id number], [id number] Status is pending   Will continue to follow.   Audry Riles, PharmD, BCPS, BCCP, CPP [facility] Clinic Pharmacist [phone]

## 2022-10-28 NOTE — Telephone Encounter (Signed)
Advanced Heart Failure Patient Advocate Encounter  Prior Authorizations for sildenafil and Opsumit have been approved.     Effective dates: 11/16/22 through 11/16/23  Audry Riles, PharmD, BCPS, BCCP, CPP [facility] Clinic Pharmacist [phone]

## 2022-11-28 ENCOUNTER — Other Ambulatory Visit (HOSPITAL_COMMUNITY): Payer: Self-pay | Admitting: Cardiology

## 2022-11-28 DIAGNOSIS — I509 Heart failure, unspecified: Secondary | ICD-10-CM

## 2022-12-25 ENCOUNTER — Telehealth (HOSPITAL_COMMUNITY): Payer: Self-pay

## 2022-12-25 NOTE — Telephone Encounter (Signed)
Oxygen order faxed on 12/25/2022

## 2023-01-30 ENCOUNTER — Other Ambulatory Visit (HOSPITAL_COMMUNITY): Payer: Self-pay | Admitting: Cardiology

## 2023-02-01 ENCOUNTER — Other Ambulatory Visit (HOSPITAL_COMMUNITY): Payer: Self-pay | Admitting: Cardiology

## 2023-02-01 DIAGNOSIS — I5032 Chronic diastolic (congestive) heart failure: Secondary | ICD-10-CM

## 2023-02-22 ENCOUNTER — Other Ambulatory Visit (HOSPITAL_COMMUNITY): Payer: Self-pay

## 2023-02-22 DIAGNOSIS — J810 Acute pulmonary edema: Secondary | ICD-10-CM

## 2023-02-22 MED ORDER — OPSUMIT 10 MG PO TABS: 10.0000 mg | ORAL_TABLET | Freq: Every day | ORAL | 0 refills | Status: DC

## 2023-03-09 DIAGNOSIS — J45909 Unspecified asthma, uncomplicated: Secondary | ICD-10-CM | POA: Insufficient documentation

## 2023-03-09 DIAGNOSIS — E663 Overweight: Secondary | ICD-10-CM | POA: Insufficient documentation

## 2023-03-09 DIAGNOSIS — E559 Vitamin D deficiency, unspecified: Secondary | ICD-10-CM | POA: Insufficient documentation

## 2023-03-09 DIAGNOSIS — M8580 Other specified disorders of bone density and structure, unspecified site: Secondary | ICD-10-CM | POA: Insufficient documentation

## 2023-03-09 DIAGNOSIS — M79676 Pain in unspecified toe(s): Secondary | ICD-10-CM | POA: Insufficient documentation

## 2023-03-09 DIAGNOSIS — E119 Type 2 diabetes mellitus without complications: Secondary | ICD-10-CM | POA: Insufficient documentation

## 2023-03-09 DIAGNOSIS — E785 Hyperlipidemia, unspecified: Secondary | ICD-10-CM | POA: Insufficient documentation

## 2023-03-09 DIAGNOSIS — J189 Pneumonia, unspecified organism: Secondary | ICD-10-CM | POA: Insufficient documentation

## 2023-03-10 ENCOUNTER — Ambulatory Visit: Payer: Medicare HMO | Admitting: Nurse Practitioner

## 2023-03-16 ENCOUNTER — Other Ambulatory Visit (HOSPITAL_COMMUNITY): Payer: Self-pay | Admitting: Cardiology

## 2023-03-16 DIAGNOSIS — J810 Acute pulmonary edema: Secondary | ICD-10-CM

## 2023-03-18 ENCOUNTER — Ambulatory Visit (HOSPITAL_BASED_OUTPATIENT_CLINIC_OR_DEPARTMENT_OTHER)
Admission: RE | Admit: 2023-03-18 | Discharge: 2023-03-18 | Disposition: A | Discharge status: Home / Self Care | Payer: Medicare HMO | Source: Ambulatory Visit | Attending: Cardiology | Admitting: Cardiology

## 2023-03-18 ENCOUNTER — Encounter (HOSPITAL_COMMUNITY): Payer: Self-pay | Admitting: Cardiology

## 2023-03-18 ENCOUNTER — Ambulatory Visit (HOSPITAL_COMMUNITY)
Admission: RE | Admit: 2023-03-18 | Discharge: 2023-03-18 | Disposition: A | Discharge status: Home / Self Care | Payer: Medicare HMO | Source: Ambulatory Visit | Attending: Internal Medicine | Admitting: Internal Medicine

## 2023-03-18 VITALS — BP 130/70 | HR 55 | Wt 120.8 lb

## 2023-03-18 DIAGNOSIS — E785 Hyperlipidemia, unspecified: Secondary | ICD-10-CM | POA: Diagnosis not present

## 2023-03-18 DIAGNOSIS — I5033 Acute on chronic diastolic (congestive) heart failure: Secondary | ICD-10-CM | POA: Diagnosis not present

## 2023-03-18 DIAGNOSIS — Z7984 Long term (current) use of oral hypoglycemic drugs: Secondary | ICD-10-CM | POA: Insufficient documentation

## 2023-03-18 DIAGNOSIS — I080 Rheumatic disorders of both mitral and aortic valves: Secondary | ICD-10-CM | POA: Diagnosis not present

## 2023-03-18 DIAGNOSIS — I4510 Unspecified right bundle-branch block: Secondary | ICD-10-CM | POA: Insufficient documentation

## 2023-03-18 DIAGNOSIS — R001 Bradycardia, unspecified: Secondary | ICD-10-CM | POA: Diagnosis not present

## 2023-03-18 DIAGNOSIS — I272 Pulmonary hypertension, unspecified: Secondary | ICD-10-CM | POA: Insufficient documentation

## 2023-03-18 DIAGNOSIS — N189 Chronic kidney disease, unspecified: Secondary | ICD-10-CM | POA: Insufficient documentation

## 2023-03-18 DIAGNOSIS — E1122 Type 2 diabetes mellitus with diabetic chronic kidney disease: Secondary | ICD-10-CM | POA: Insufficient documentation

## 2023-03-18 DIAGNOSIS — I5032 Chronic diastolic (congestive) heart failure: Secondary | ICD-10-CM | POA: Diagnosis present

## 2023-03-18 DIAGNOSIS — G4733 Obstructive sleep apnea (adult) (pediatric): Secondary | ICD-10-CM | POA: Diagnosis not present

## 2023-03-18 DIAGNOSIS — Z87891 Personal history of nicotine dependence: Secondary | ICD-10-CM | POA: Insufficient documentation

## 2023-03-18 DIAGNOSIS — I471 Supraventricular tachycardia, unspecified: Secondary | ICD-10-CM | POA: Insufficient documentation

## 2023-03-18 DIAGNOSIS — I491 Atrial premature depolarization: Secondary | ICD-10-CM | POA: Diagnosis not present

## 2023-03-18 DIAGNOSIS — Z9981 Dependence on supplemental oxygen: Secondary | ICD-10-CM | POA: Diagnosis not present

## 2023-03-18 DIAGNOSIS — I130 Hypertensive heart and chronic kidney disease with heart failure and stage 1 through stage 4 chronic kidney disease, or unspecified chronic kidney disease: Secondary | ICD-10-CM | POA: Diagnosis not present

## 2023-03-18 DIAGNOSIS — Z8673 Personal history of transient ischemic attack (TIA), and cerebral infarction without residual deficits: Secondary | ICD-10-CM | POA: Diagnosis not present

## 2023-03-18 DIAGNOSIS — Z79899 Other long term (current) drug therapy: Secondary | ICD-10-CM | POA: Insufficient documentation

## 2023-03-18 DIAGNOSIS — J449 Chronic obstructive pulmonary disease, unspecified: Secondary | ICD-10-CM | POA: Insufficient documentation

## 2023-03-18 LAB — BASIC METABOLIC PANEL
Anion gap: 14 (ref 5–15)
BUN: 49 mg/dL — ABNORMAL HIGH (ref 8–23)
CO2: 29 mmol/L (ref 22–32)
Calcium: 9.3 mg/dL (ref 8.9–10.3)
Chloride: 96 mmol/L — ABNORMAL LOW (ref 98–111)
Creatinine, Ser: 1.47 mg/dL — ABNORMAL HIGH (ref 0.44–1.00)
GFR, Estimated: 35 mL/min — ABNORMAL LOW (ref >60)
Glucose, Bld: 114 mg/dL — ABNORMAL HIGH (ref 70–99)
Potassium: 4.5 mmol/L (ref 3.5–5.1)
Sodium: 139 mmol/L (ref 135–145)

## 2023-03-18 LAB — CBC
HCT: 39.2 % (ref 36.0–46.0)
Hemoglobin: 12.2 g/dL (ref 12.0–15.0)
MCH: 31.2 pg (ref 26.0–34.0)
MCHC: 31.1 g/dL (ref 30.0–36.0)
MCV: 100.3 fL — ABNORMAL HIGH (ref 80.0–100.0)
Platelets: 221 10*3/uL (ref 150–400)
RBC: 3.91 MIL/uL (ref 3.87–5.11)
RDW: 14.3 % (ref 11.5–15.5)
WBC: 5.8 10*3/uL (ref 4.0–10.5)
nRBC: 0 % (ref 0.0–0.2)

## 2023-03-18 LAB — BRAIN NATRIURETIC PEPTIDE: B Natriuretic Peptide: 94.5 pg/mL (ref 0.0–100.0)

## 2023-03-18 LAB — ECHOCARDIOGRAM COMPLETE
AR max vel: 1.48 cm2
AV Area VTI: 1.55 cm2
AV Area mean vel: 1.5 cm2
AV Mean grad: 12 mmHg
AV Peak grad: 22.7 mmHg
Ao pk vel: 2.38 m/s
Area-P 1/2: 2.36 cm2
Calc EF: 72.9 %
S' Lateral: 2 cm
Single Plane A2C EF: 72.3 %
Single Plane A4C EF: 74.8 %

## 2023-03-18 LAB — TSH: TSH: 0.9 u[IU]/mL (ref 0.350–4.500)

## 2023-03-18 MED ORDER — EZETIMIBE 10 MG PO TABS: 10.0000 mg | ORAL_TABLET | Freq: Every day | ORAL | 2 refills | Status: DC

## 2023-03-18 MED ORDER — BISOPROLOL FUMARATE 10 MG PO TABS: 10.0000 mg | ORAL_TABLET | Freq: Every day | ORAL | 3 refills | Status: DC

## 2023-03-18 NOTE — Progress Notes (Signed)
6 Min Walk Test Completed  Pt ambulated 243.8 meters  O2 Sat ranged 86%-98% on 2-4L oxygen HR ranged 52-116

## 2023-03-18 NOTE — Progress Notes (Signed)
Date:  03/18/2023   ID:  Beth [name], DOB [birth year], MRN [id number]    Provider location: [place] [facility] Type of Visit: Established patient  PCP:  Andi Devon, MD  Cardiologist:  Dr. Shirlee Latch   History of Present Illness: Beth [name] is a 82 y.o. female who has a history of COPD on home oxygen, untreated OSA, chronic diastolic CHF/RV failure, and pulmonary hypertension.  She was admitted in 5/17 with acute on chronic diastolic CHF with prominent RV failure and marked shortness of breath.  She was diuresed with IV Lasix.  Echo showed dilated and dysfunctional RV with pulmonary hypertension.  RHC showed marked pulmonary hypertension with systemic PA pressure.  She was started on Revatio in the hospital and this was titrated up.  Subsequently, she was started on macitentan.  She next started Selexipag but she had intractable side effects with selexipag so stopped it.  I then tried her on Tyvaso, but she has developed abdominal discomfort and diarrhea with Tyvaso and has had to stop it (symptomatic even at lowest dose). She did not feel like it helped her breathing when she was on it.  She stopped sildenafil and started riociguat. However, she developed painful hand swelling on riociguat and has had to stop it.  Symptoms resolved off riociguat.     She was admitted in 8/18 with hypotension and AKI.  Valsartan and HCTZ stopped.      Echo in 11/19 showed EF 60-65%, mild AS, normal RV size and systolic function.    Echo in 12/20 showed EF 65-70%, LV mid-cavity gradient to 65 mmHg, mild AS with mean gradient 14 mmHg, normal RV size and systolic function, IVC normal, no TR jet. Echo in 1/22 showed EF 65-70%, mild LVH, mid-cavity LV gradient peak 27 mmHg, normal RV size and systolic function, normal IVC, PASP 18 mmHg, mild AS.   Echo was done today and reviewed, EF 70-75%, mid-cavity gradient to 32 mmHg with valsalva, normal RV, unable to estimate PA systolic pressure, IVC  normal, mild AS mean gradient 12 mmHg, mild MR.   She returns for followup of pulmonary hypertension.  She wears 2 L [place] at all times, not able to tolerate CPAP.  Weight is stable.  6 minute walk stable today.  She reports chronic fatigue, gets tired easily.  This has been slowly progressive.  She gets short of breath cleaning the house but generally does ok walking on flat ground.  No orthopnea/PND.  No chest pain. No lightheadedness or syncope.   ECG (personally reviewed): NSR, RBBB   6 minute walk (7/17): 238 m 6 minute walk (10/17): 171 m 6 minute walk (3/18): 231 m 6 minute walk (11/18): 123 m 6 minute walk (1/19): 213 m 6 minute walk (10/19): 231 m 6 minute walk (9/20): 122 m 6 minute walk (11/20): 213 m 6 minute walk (3/21): 182 m 6 minute walk (9/21): 243 m 6 minute walk (4/22): 213 m 6 minute walk (12/22): 213 m 6 minute walk (3/23): 244 m 6 minute walk (5/24): 244 m   Labs (5/17): K 4.7 => 4, creatinine 0.96 => 1.04 Labs (8/17): K 4.1, creatinine 1.5 Labs (9/17): K 3.5, creatinine 1.13 Labs (10/17): K 3.6, creatinine 1.0 Labs (1/18): K 3.9, creatinine 1.14, BNP 34 Labs (2/18): K 4.1, creatinine 1.07, BNP 25 Labs (3/18): K 3.8, creatinine 1.06, BNP 25 Labs (8/18): K 4.6, creatinine 1.38, hgb 9.1 Labs (9/18): BNP 8.7 Labs (11/18): BNP 36,  K 3.6, creatinine 0.65 Labs (6/20): K 3.6, creatinine 1.17, BNP 21.6 Labs (9/20): K 3.3, creatinine 0.91, BNP 25 Labs (12/20): K 3.6, creatinine 1.06, BNP 21 Labs (3/21): K 3.6, creatinine 1.27, BNP 31 Labs (9/21): K 4, creatinine 1.29 Labs (11/21): LDL 80, HDL 89 Labs (1/22): K 3.6, creatinine 1.66, BNP 78.5 Labs (4/22): BNP 23, K 4.4, creatinine 1.53 Labs (8/22): K 3.6, creatinine 1.26, BNP 27 Labs (12/22): BNP 124 Labs (1/23): K 4.3, creatinine 1.47 Labs (3/23): K 4.1, creatinine 1.35, LDL 73, BNP 88   PMH: 1. H/o CVA 2. DM 3. HTN 4. Hyperlipidemia 5. OSA: Cannot tolerate CPAP, has tried multiple masks.  6. COPD: She is on  home oxygen. 7. SVT 8. Chronic diastolic CHF with RV failure: Echo (5/17) with EF 55%, severely dilated RV with moderately decreased systolic function, D-shaped interventricular septum.  - Echo (7/18): EF 60-65%, D-shaped interventricular septum, mild RV dilation with normal systolic function, PASP 41, IVC normal, mild MR, mild AS.  - Echo (11/19): EF 60-65%, mild AS, normal RV size and systolic function.  - Echo (12/20): EF 65-70%, LV mid-cavity gradient to 65 mmHg, mild AS with mean gradient 14 mmHg, normal RV size and systolic function, IVC normal, no TR jet.  - Echo (1/22): EF 65-70%, mild LVH, mid-cavity LV gradient peak 27 mmHg, normal RV size and systolic function, normal IVC, PASP 18 mmHg, mild AS.   - Echo (5/24): EF 70-75%, mid-cavity gradient to 32 mmHg with valsalva, normal RV, unable to estimate PA systolic pressure, IVC normal, mild AS mean gradient 12 mmHg, mild MR.  9. 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  V/Q scan 5/17 with no evidence for chronic PE.  ANA weakly positive (1:80), ANCA negative, RF negative.  RHC (5/17) with mean RA 20, PA 110/45 mean 66, mean PCWP 20, CI 1.7, PVR 15 WU.  - Intractable side effects with selexipag.  - Did not tolerate Tyvaso - Did not tolerate riociguat.  10. CKD 11. Aortic stenosis: Mild.   Current Outpatient Medications  Medication Sig Dispense Refill   albuterol (PROVENTIL) (2.5 MG/3ML) 0.083% nebulizer solution Take 2.5 mg by nebulization every 6 (six) hours as needed for wheezing or shortness of breath.     albuterol (VENTOLIN HFA) 108 (90 Base) MCG/ACT inhaler INHALE 2 PUFFS INTO THE LUNGS EVERY 4 HOURS AS NEEDED ONLY IF YOU CANT CATCH YOUR BREATH 18 Inhaler 2   allopurinol (ZYLOPRIM) 300 MG tablet Take 300 mg by mouth daily.     Ascorbic Acid (VITAMIN C) 1000 MG tablet Take 1,000 mg by mouth daily.     ASPIRIN LOW DOSE 81 MG tablet TAKE 1  TABLET BY MOUTH EVERY DAY 90 tablet 3   atorvastatin (LIPITOR) 80 MG tablet TAKE 1 TABLET BY MOUTH EVERY DAY 90 tablet 0   b complex vitamins tablet Take 1 tablet by mouth daily.     Cholecalciferol (VITAMIN D3) 1000 units CAPS Take 1,000 Units by mouth daily.     clobetasol cream (TEMOVATE) 0.05 % Apply 1 application topically as needed.      co-enzyme Q-10 30 MG capsule Take 30 mg by mouth daily.     furosemide (LASIX) 40 MG tablet TAKE 2 TABLETS BY MOUTH EVERY MORNING. CAN TAKE EXTRA DOSE EVERY OTHER AFTERNOON WITH WEIGHT GAIN. 270 tablet 3   KLOR-CON M20 20 MEQ tablet TAKE 3 TABLETS (60 MEQ TOTAL) BY  MOUTH DAILY. 270 tablet 3   loratadine (CLARITIN) 10 MG tablet Take 10 mg by mouth as needed.      meloxicam (MOBIC) 15 MG tablet Take 1 tablet by mouth as needed.     metFORMIN (GLUCOPHAGE) 500 MG tablet Take 500 mg by mouth 2 (two) times daily with a meal.     Omega 3 1000 MG CAPS Take 1 capsule by mouth daily.     omeprazole (PRILOSEC) 10 MG capsule Take 10 mg by mouth daily.     OPSUMIT 10 MG tablet TAKE 1 TABLET (10MG  TOTAL) BY MOUTH DAILY. PLEASE CALL TO SCHEDULE AN APPOINTMENT. 30 tablet 0   OXYGEN Inhale 2 L into the lungs continuous.     sildenafil (REVATIO) 20 MG tablet Take 4 tablets (80 mg total) by mouth 3 (three) times daily. 360 tablet 11   TRELEGY ELLIPTA 100-62.5-25 MCG/ACT AEPB INHALE 1 PUFF BY MOUTH EVERY DAY 180 each 4   bisoprolol (ZEBETA) 10 MG tablet Take 1 tablet (10 mg total) by mouth daily. 90 tablet 3   ezetimibe (ZETIA) 10 MG tablet Take 1 tablet (10 mg total) by mouth daily. 30 tablet 2   No current facility-administered medications for this encounter.    Allergies:   Aldomet [methyldopa] and Tyvaso [treprostinil]   Social History:  The patient  reports that she quit smoking about 18 years ago. Her smoking use included cigarettes. She has a 60.00 pack-year smoking history. She has quit using smokeless tobacco. She reports that she does not drink alcohol and does  not use drugs.   Family History:  The patient's family history includes Heart disease in her brother, brother, mother, sister, and sister; Heart failure in her father and mother.   ROS:  Please see the history of present illness.   All other systems are personally reviewed and negative.   Exam:   BP 130/70   Pulse (!) 55   Wt 54.8 kg (120 lb 12.8 oz)   SpO2 95%   BMI 22.09 kg/m  General: NAD Neck: No JVD, no thyromegaly or thyroid nodule.  Lungs: Distant BS CV: Nondisplaced PMI.  Heart regular S1/S2, no S3/S4, 2/6 SEM RUSB with clear S2.  Trace ankle edema.  No carotid bruit.  Normal pedal pulses.  Abdomen: Soft, nontender, no hepatosplenomegaly, no distention.  Skin: Intact without lesions or rashes.  Neurologic: Alert and oriented x 3.  Psych: Normal affect. Extremities: No clubbing or cyanosis.  HEENT: Normal.   Recent Labs: 03/18/2023: B Natriuretic Peptide 94.5; BUN 49; Creatinine, Ser 1.47; Hemoglobin 12.2; Platelets 221; Potassium 4.5; Sodium 139; TSH 0.900  Personally reviewed   Wt Readings from Last 3 Encounters:  03/18/23 54.8 kg (120 lb 12.8 oz)  02/10/22 54.3 kg (119 lb 12.8 oz)  11/11/21 54 kg (119 lb)    ASSESSMENT AND PLAN:  1. 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  She was very short of breath with exertion and had very elevated right-sided filling pressures on 5/17 RHC.  PA pressure was systemic. Cardiac output low, but not moving towards IV Flolan given the mixed etiology of her PH.  V/Q scan not suggestive of chronic PEs and autoimmune serologies sent (RF negative, ANCA negative, ANA only weakly positive (1:80). She has been unable to tolerate selexipag, Tyvaso, or riociguat. Echo in 1/22 showed normal RV size and systolic function and estimation of PA pressure, surprisingly, was not significantly  elevated. Echo today showed normal RV size and systolic function,  unable to estimate PA systolic pressure.   NYHA class II-III, fatigue gradually worsening.  Stable 6 minute walk today.  - Continue sildenafil 80 mg tid.  - Continue Opsumit 10 mg daily.  - Check BNP.  - I recommended pulmonary rehab, she wants to think about it.  2. COPD: On home oxygen.  Prior long-time smoker. No change.   3. Chronic diastolic CHF with prominent RV failure: Echo in 1/22 with RV function improved, appears normal.  Echo today showed EF 70-75%, mid-cavity gradient to 32 mmHg with valsalva, normal RV, unable to estimate PA systolic pressure, IVC normal, mild AS mean gradient 12 mmHg, mild MR.  NYHA class II-III, she is not volume overloaded today.  - Continue Lasix 80 mg daily.  BMET today.  - Continue bisoprolol 10 mg daily (beta-1 selective beta blocker with COPD) given mid-cavity gradient.   4. SVT: Denies palpitations.  5. OSA: Has been unable to tolerate CPAP.  She uses home oxygen.  OSA may have small contribution to [facility] but does not explain the extent of PAH.  6. Hyperlipidemia - Continue statin.  7. HTN: BP controlled.    8. Aortic stenosis: Mild on today's echo.    Followup in 6 months with APP.   Signed, Marca Ancona, MD  03/18/2023  [facility] [address] [facility] [place] [address] [phone] (office) [phone] (fax)

## 2023-03-18 NOTE — Progress Notes (Signed)
Echocardiogram 2D Echocardiogram has been performed.  Toni Amend 03/18/2023, 8:57 AM

## 2023-03-18 NOTE — Patient Instructions (Addendum)
  Lab Work:  Labs done today, your results will be available in MyChart, we will contact you for abnormal readings.  6 minute walk was done today!  You have been referred to pulmonary rehab, they will call you to schedule an appointment.    Follow-Up in:   Your physician recommends that you schedule a follow-up appointment in: 6 months with APP (November)  ** please call back in August to schedule the follow up appointment**   Do the following things EVERYDAY: Weigh yourself in the morning before breakfast. Write it down and keep it in a log. Take your medicines as prescribed Eat low salt foods--Limit salt (sodium) to 2000 mg per day.  Stay as active as you can everyday Limit all fluids for the day to less than 2 liters    Need to Contact us:  If you have any questions or concerns before your next appointment please send [place] a message through [place] or call our office at [phone].    TO LEAVE A MESSAGE FOR THE NURSE SELECT OPTION 2, PLEASE LEAVE A MESSAGE INCLUDING: YOUR NAME DATE OF BIRTH CALL BACK NUMBER REASON FOR CALL**this is important as we prioritize the call backs  YOU WILL RECEIVE A CALL BACK THE SAME DAY AS LONG AS YOU CALL BEFORE 4:00 PM   At the [facility], you and your health needs are our priority. As part of our continuing mission to provide you with exceptional heart care, we have created designated Provider Care Teams. These Care Teams include your primary Cardiologist (physician) and Advanced Practice Providers (APPs- Physician Assistants and Nurse Practitioners) who all work together to provide you with the care you need, when you need it.   You may see any of the following providers on your designated Care Team at your next follow up: Dr Arvilla Meres Dr Marca Ancona Dr. Marcos Eke, NP Robbie Lis, [place] [facility] [place], [place] Brynda Peon, NP Karle Plumber, PharmD   Please be sure to bring in  all your medications bottles to every appointment.    Thank you for choosing [facility]

## 2023-04-01 ENCOUNTER — Ambulatory Visit (INDEPENDENT_AMBULATORY_CARE_PROVIDER_SITE_OTHER): Payer: Medicare HMO | Admitting: Family Medicine

## 2023-04-01 ENCOUNTER — Encounter: Payer: Self-pay | Admitting: Family Medicine

## 2023-04-01 VITALS — BP 150/58 | HR 73 | Temp 98.4°F | Ht 60.6 in | Wt 118.0 lb

## 2023-04-01 DIAGNOSIS — I5032 Chronic diastolic (congestive) heart failure: Secondary | ICD-10-CM | POA: Diagnosis not present

## 2023-04-01 DIAGNOSIS — Z6822 Body mass index (BMI) 22.0-22.9, adult: Secondary | ICD-10-CM

## 2023-04-01 DIAGNOSIS — E1169 Type 2 diabetes mellitus with other specified complication: Secondary | ICD-10-CM

## 2023-04-01 DIAGNOSIS — J449 Chronic obstructive pulmonary disease, unspecified: Secondary | ICD-10-CM

## 2023-04-01 DIAGNOSIS — H6123 Impacted cerumen, bilateral: Secondary | ICD-10-CM

## 2023-04-01 DIAGNOSIS — E782 Mixed hyperlipidemia: Secondary | ICD-10-CM

## 2023-04-01 DIAGNOSIS — R319 Hematuria, unspecified: Secondary | ICD-10-CM

## 2023-04-01 DIAGNOSIS — E118 Type 2 diabetes mellitus with unspecified complications: Secondary | ICD-10-CM

## 2023-04-01 LAB — POCT URINALYSIS DIPSTICK
Glucose, UA: NEGATIVE
Ketones, UA: NEGATIVE
Nitrite, UA: NEGATIVE
Protein, UA: POSITIVE — AB
Spec Grav, UA: 1.025 (ref 1.010–1.025)
Urobilinogen, UA: 0.2 E.U./dL
pH, UA: 5.5 (ref 5.0–8.0)

## 2023-04-01 NOTE — Progress Notes (Signed)
I,[name],acting as a Neurosurgeon for [employer], NP.,have documented all relevant documentation on the behalf of Kaneshia Cater, NP,as directed by  Viraj Liby Moshe Salisbury, NP while in the presence of Skila Rollins, NP.    Subjective:     Patient ID: Beth [name] , female    DOB: January 15, 1941 , 82 y.o.   MRN: [id number]   Chief Complaint  Patient presents with   Establish Care    HPI  Patient presents today to establish primary care. She was a patient of Dr.Shelton. she last went to see Dr.Shelton about a year ago. She reports she does have a cardiologist named Dr.Mclean.,she had an Echocardiogram on 03/18/2023  with EF 70 -75% , she sees  a pulmonologist Dr Sherene Sires , for COPD. Patient is on home oxygen at 2L [place], has dyspnea with long distance walking, denies chest pain.  Patient was accompanied by daughter, Ms Erie Noe.  Wt Readings from Last 3 Encounters: 04/01/23 : 118 lb (53.5 kg) 03/18/23 : 120 lb 12.8 oz (54.8 kg) 02/10/22 : 119 lb 12.8 oz (54.3 kg)       Past Medical History:  Diagnosis Date   Arthritis    Asthma    CHF (congestive heart failure) (HCC)    CKD (chronic kidney disease)    COPD (chronic obstructive pulmonary disease) (HCC)    dR. wERT   Diabetes mellitus (HCC)    type 2   Gout    Hyperlipidemia    Hypertension    Shortness of breath    WITH EXERTION    Sleep apnea    USES  CPAP    Stroke (HCC)      Family History  Problem Relation Age of Onset   Heart disease Mother    Heart failure Mother    Heart disease Brother    Heart disease Brother    Heart disease Sister    Heart disease Sister    Heart failure Father      Current Outpatient Medications:    albuterol (PROVENTIL) (2.5 MG/3ML) 0.083% nebulizer solution, Take 2.5 mg by nebulization every 6 (six) hours as needed for wheezing or shortness of breath., Disp: , Rfl:    albuterol (VENTOLIN HFA) 108 (90 Base) MCG/ACT inhaler, INHALE 2 PUFFS INTO THE LUNGS EVERY 4 HOURS AS NEEDED ONLY IF YOU  CANT CATCH YOUR BREATH, Disp: 18 Inhaler, Rfl: 2   allopurinol (ZYLOPRIM) 300 MG tablet, Take 300 mg by mouth daily., Disp: , Rfl:    Ascorbic Acid (VITAMIN C) 1000 MG tablet, Take 1,000 mg by mouth daily., Disp: , Rfl:    ASPIRIN LOW DOSE 81 MG tablet, TAKE 1 TABLET BY MOUTH EVERY DAY, Disp: 90 tablet, Rfl: 3   atorvastatin (LIPITOR) 80 MG tablet, TAKE 1 TABLET BY MOUTH EVERY DAY, Disp: 90 tablet, Rfl: 0   b complex vitamins tablet, Take 1 tablet by mouth daily., Disp: , Rfl:    bisoprolol (ZEBETA) 10 MG tablet, Take 1 tablet (10 mg total) by mouth daily., Disp: 90 tablet, Rfl: 3   Cholecalciferol (VITAMIN D3) 1000 units CAPS, Take 1,000 Units by mouth daily., Disp: , Rfl:    clobetasol cream (TEMOVATE) 0.05 %, Apply 1 application topically as needed. , Disp: , Rfl:    co-enzyme Q-10 30 MG capsule, Take 30 mg by mouth daily., Disp: , Rfl:    ezetimibe (ZETIA) 10 MG tablet, Take 1 tablet (10 mg total) by mouth daily., Disp: 30 tablet, Rfl: 2   furosemide (LASIX)  40 MG tablet, TAKE 2 TABLETS BY MOUTH EVERY MORNING. CAN TAKE EXTRA DOSE EVERY OTHER AFTERNOON WITH WEIGHT GAIN., Disp: 270 tablet, Rfl: 3   KLOR-CON M20 20 MEQ tablet, TAKE 3 TABLETS (60 MEQ TOTAL) BY MOUTH DAILY., Disp: 270 tablet, Rfl: 3   loratadine (CLARITIN) 10 MG tablet, Take 10 mg by mouth as needed. , Disp: , Rfl:    meloxicam (MOBIC) 15 MG tablet, Take 1 tablet by mouth as needed., Disp: , Rfl:    metFORMIN (GLUCOPHAGE) 500 MG tablet, Take 500 mg by mouth 2 (two) times daily with a meal., Disp: , Rfl:    Omega 3 1000 MG CAPS, Take 1 capsule by mouth daily., Disp: , Rfl:    omeprazole (PRILOSEC) 10 MG capsule, Take 10 mg by mouth daily., Disp: , Rfl:    OPSUMIT 10 MG tablet, TAKE 1 TABLET (10MG  TOTAL) BY MOUTH DAILY. PLEASE CALL TO SCHEDULE AN APPOINTMENT., Disp: 30 tablet, Rfl: 0   OXYGEN, Inhale 2 L into the lungs continuous., Disp: , Rfl:    sildenafil (REVATIO) 20 MG tablet, Take 4 tablets (80 mg total) by mouth 3 (three)  times daily., Disp: 360 tablet, Rfl: 11   TRELEGY ELLIPTA 100-62.5-25 MCG/ACT AEPB, INHALE 1 PUFF BY MOUTH EVERY DAY, Disp: 180 each, Rfl: 4   Allergies  Allergen Reactions   Aldomet [Methyldopa] Hives   Tyvaso [Treprostinil] Diarrhea     Review of Systems  Constitutional:  Positive for activity change.  Eyes: Negative.   Respiratory:  Positive for shortness of breath.        Shortness of breath on exertion and long distance walking  Gastrointestinal: Negative.   Endocrine: Negative.   Musculoskeletal: Negative.   Skin: Negative.   Neurological: Negative.   Psychiatric/Behavioral: Negative.       Today's Vitals   04/01/23 1531  BP: (!) 150/58  Pulse: 73  Temp: 98.4 F (36.9 C)  SpO2: 93%  Weight: 118 lb (53.5 kg)  Height: 5' 0.6" (1.539 m)  PainSc: 0-No pain   Body mass index is 22.59 kg/m.  The ASCVD Risk score (Arnett DK, et al., 2019) failed to calculate for the following reasons:   The 2019 ASCVD risk score is only valid for ages 74 to 70   The patient has a prior MI or stroke diagnosis ++ Objective:  Physical Exam HENT:     Ears:     Comments: Excessive cerumen in both ears Cardiovascular:     Rate and Rhythm: Normal rate and regular rhythm.  Pulmonary:     Breath sounds: No wheezing, rhonchi or rales.  Abdominal:     General: Bowel sounds are normal.  Skin:    General: Skin is warm and dry.  Neurological:     Mental Status: She is oriented to person, place, and time.         Assessment And Plan:     1. Diabetes mellitus type 2 with complications (HCC) Comments: Check hgbA1c; has not seen previous PCP in over 1 year - Hemoglobin A1c - CMP14+EGFR - Lipid panel - CBC no Diff - POCT Urinalysis Dipstick (81002) - Microalbumin / Creatinine Urine Ratio  2. Mixed hyperlipidemia Comments: Check lipids on, continue atorvastatin *0mg  once daily as prescribed by cardiology  3. Excessive cerumen in ear canal, bilateral Comments: Worse in right ear  than left. Ear lavage will be performed in the office  4. Hematuria, unspecified type - Culture, Urine  5. BMI 22.0-22.9, adult  6. Chronic diastolic  CHF (congestive heart failure) (HCC) Comments: Echo 03/18/2023; EF 70-75%; monitored by Dr Shirlee Latch Cardiologist  7. 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  Patient was given opportunity to ask questions. Patient verbalized understanding of the plan and was able to repeat key elements of the plan. All questions were answered to their satisfaction.  Cam Harnden Moshe Salisbury, NP   I, Londa Mackowski Moshe Salisbury, NP, have reviewed all documentation for this visit. The documentation on 04/01/23 for the exam, diagnosis, procedures, and orders are all accurate and complete.   IF YOU HAVE BEEN REFERRED TO A SPECIALIST, IT MAY TAKE 1-2 WEEKS TO SCHEDULE/PROCESS THE REFERRAL. IF YOU HAVE NOT HEARD FROM US/SPECIALIST IN TWO WEEKS, PLEASE GIVE [place] A CALL AT [phone].   THE PATIENT IS ENCOURAGED TO PRACTICE SOCIAL DISTANCING DUE TO THE COVID-19 PANDEMIC.

## 2023-04-01 NOTE — Patient Instructions (Signed)

## 2023-04-02 LAB — CBC
Hematocrit: 36.4 % (ref 34.0–46.6)
Hemoglobin: 12 g/dL (ref 11.1–15.9)
MCH: 31.3 pg (ref 26.6–33.0)
MCHC: 33 g/dL (ref 31.5–35.7)
MCV: 95 fL (ref 79–97)
Platelets: 194 10*3/uL (ref 150–450)
RBC: 3.83 x10E6/uL (ref 3.77–5.28)
RDW: 13.1 % (ref 11.7–15.4)
WBC: 4.9 10*3/uL (ref 3.4–10.8)

## 2023-04-02 LAB — MICROALBUMIN / CREATININE URINE RATIO
Creatinine, Urine: 111.5 mg/dL
Microalb/Creat Ratio: 141 mg/g creat — ABNORMAL HIGH (ref 0–29)
Microalbumin, Urine: 156.9 ug/mL

## 2023-04-02 LAB — CMP14+EGFR
ALT: 15 IU/L (ref 0–32)
AST: 21 IU/L (ref 0–40)
Albumin/Globulin Ratio: 1.9 (ref 1.2–2.2)
Albumin: 4.3 g/dL (ref 3.7–4.7)
Alkaline Phosphatase: 56 IU/L (ref 44–121)
BUN/Creatinine Ratio: 28 (ref 12–28)
BUN: 48 mg/dL — ABNORMAL HIGH (ref 8–27)
Bilirubin Total: 0.3 mg/dL (ref 0.0–1.2)
CO2: 31 mmol/L — ABNORMAL HIGH (ref 20–29)
Calcium: 9.2 mg/dL (ref 8.7–10.3)
Chloride: 96 mmol/L (ref 96–106)
Creatinine, Ser: 1.74 mg/dL — ABNORMAL HIGH (ref 0.57–1.00)
Globulin, Total: 2.3 g/dL (ref 1.5–4.5)
Glucose: 116 mg/dL — ABNORMAL HIGH (ref 70–99)
Potassium: 5 mmol/L (ref 3.5–5.2)
Sodium: 140 mmol/L (ref 134–144)
Total Protein: 6.6 g/dL (ref 6.0–8.5)
eGFR: 29 mL/min/{1.73_m2} — ABNORMAL LOW (ref >59)

## 2023-04-02 LAB — LIPID PANEL
Chol/HDL Ratio: 2.1 ratio (ref 0.0–4.4)
Cholesterol, Total: 184 mg/dL (ref 100–199)
HDL: 86 mg/dL (ref >39)
LDL Chol Calc (NIH): 86 mg/dL (ref 0–99)
Triglycerides: 61 mg/dL (ref 0–149)
VLDL Cholesterol Cal: 12 mg/dL (ref 5–40)

## 2023-04-02 LAB — HEMOGLOBIN A1C
Est. average glucose Bld gHb Est-mCnc: 134 mg/dL
Hgb A1c MFr Bld: 6.3 % — ABNORMAL HIGH (ref 4.8–5.6)

## 2023-04-03 LAB — URINE CULTURE

## 2023-04-05 ENCOUNTER — Encounter (HOSPITAL_COMMUNITY): Payer: Self-pay

## 2023-04-05 ENCOUNTER — Encounter (HOSPITAL_COMMUNITY)
Admission: RE | Admit: 2023-04-05 | Discharge: 2023-04-05 | Disposition: A | Discharge status: Home / Self Care | Payer: Medicare HMO | Source: Ambulatory Visit | Attending: Cardiology | Admitting: Cardiology

## 2023-04-05 VITALS — BP 160/68 | HR 67 | Wt 117.9 lb

## 2023-04-05 DIAGNOSIS — J449 Chronic obstructive pulmonary disease, unspecified: Secondary | ICD-10-CM | POA: Insufficient documentation

## 2023-04-05 NOTE — Progress Notes (Signed)
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 Heart rate is normal, breath sounds diminished to auscultation, no wheezes, rales, or rhonchi. Pt denies chronic cough. Bowel sounds present x4 quads. Pt denies abdominal discomfort, nausea, vomiting, diarrhea or constipation. Grip strength equal, strong. Distal pulses +2; no swelling to lower extremities.   Essie Hart, RN, BSN

## 2023-04-05 NOTE — Progress Notes (Signed)
Beth [name] 82 y.o. female Pulmonary Rehab Orientation Note This patient who was referred to Pulmonary Rehab by Dr. Shirlee Latch with the diagnosis of COPD stage 3 arrived today in Cardiac and Pulmonary Rehab. She  arrived ambulatory with assistive device with normal gait. She  does carry portable oxygen. [employer] is the provider for their DME. Per patient, Beth [name] uses oxygen continuously. Color good, skin warm and dry. Patient is oriented to time and place. Patient's medical history, psychosocial health, and medications reviewed. Psychosocial assessment reveals patient lives with family. Beth [name] is currently retired. Patient hobbies include watching tv and spending time with others. Patient reports her stress level is moderate. Areas of stress/anxiety include family . Patient does not exhibit signs of depression. PHQ2/9 score 1/1. Beth [name] shows good  coping skills with positive outlook on life. Offered emotional support and reassurance. Will continue to monitor. Physical assessment performed by Beth Hart RN. Please see their orientation physical assessment note. Beth [name] reports she does take medications as prescribed. Patient states she follows a regular  diet. The patient reports no specific efforts to gain or lose weight.. Patient's weight will be monitored closely. Demonstration and practice of PLB using pulse oximeter. Beth [name] able to return demonstration satisfactorily. Safety and hand hygiene in the exercise area reviewed with patient. Beth [name] voices understanding of the information reviewed. Department expectations discussed with patient and achievable goals were set. The patient shows enthusiasm about attending the program and we look forward to working with Beth [name]. Beth [name] completed a 6 min walk test today and is scheduled to begin exercise on 04/13/23 at 1:15 pm.   [id number] Beth San, MS, ACSM-CEP

## 2023-04-05 NOTE — Progress Notes (Signed)
Pulmonary Individual Treatment Plan  Patient Details  Name: Beth [name] MRN: [id number] Date of Birth: 03/07/1941 Referring Provider:   Doristine Devoid Pulmonary Rehab Walk Test from 04/05/2023 in [facility]  Referring Provider [facility]       Initial Encounter Date:  Flowsheet Row Pulmonary Rehab Walk Test from 04/05/2023 in [facility]  Date 04/05/23       Visit Diagnosis: Stage 2 moderate COPD by GOLD classification (HCC)  Patient's Home Medications on Admission:   Current Outpatient Medications:    albuterol (PROVENTIL) (2.5 MG/3ML) 0.083% nebulizer solution, Take 2.5 mg by nebulization every 6 (six) hours as needed for wheezing or shortness of breath., Disp: , Rfl:    albuterol (VENTOLIN HFA) 108 (90 Base) MCG/ACT inhaler, INHALE 2 PUFFS INTO THE LUNGS EVERY 4 HOURS AS NEEDED ONLY IF YOU CANT CATCH YOUR BREATH, Disp: 18 Inhaler, Rfl: 2   allopurinol (ZYLOPRIM) 300 MG tablet, Take 300 mg by mouth daily., Disp: , Rfl:    Ascorbic Acid (VITAMIN C) 1000 MG tablet, Take 1,000 mg by mouth daily., Disp: , Rfl:    ASPIRIN LOW DOSE 81 MG tablet, TAKE 1 TABLET BY MOUTH EVERY DAY, Disp: 90 tablet, Rfl: 3   atorvastatin (LIPITOR) 80 MG tablet, TAKE 1 TABLET BY MOUTH EVERY DAY, Disp: 90 tablet, Rfl: 0   b complex vitamins tablet, Take 1 tablet by mouth daily., Disp: , Rfl:    bisoprolol (ZEBETA) 10 MG tablet, Take 1 tablet (10 mg total) by mouth daily., Disp: 90 tablet, Rfl: 3   Cholecalciferol (VITAMIN D3) 1000 units CAPS, Take 1,000 Units by mouth daily., Disp: , Rfl:    clobetasol cream (TEMOVATE) 0.05 %, Apply 1 application topically as needed. , Disp: , Rfl:    co-enzyme Q-10 30 MG capsule, Take 30 mg by mouth daily., Disp: , Rfl:    ezetimibe (ZETIA) 10 MG tablet, Take 1 tablet (10 mg total) by mouth daily., Disp: 30 tablet, Rfl: 2   furosemide (LASIX) 40 MG tablet, TAKE  2 TABLETS BY MOUTH EVERY MORNING. CAN TAKE EXTRA DOSE EVERY OTHER AFTERNOON WITH WEIGHT GAIN., Disp: 270 tablet, Rfl: 3   KLOR-CON M20 20 MEQ tablet, TAKE 3 TABLETS (60 MEQ TOTAL) BY MOUTH DAILY., Disp: 270 tablet, Rfl: 3   loratadine (CLARITIN) 10 MG tablet, Take 10 mg by mouth as needed. , Disp: , Rfl:    meloxicam (MOBIC) 15 MG tablet, Take 1 tablet by mouth as needed., Disp: , Rfl:    metFORMIN (GLUCOPHAGE) 500 MG tablet, Take 500 mg by mouth 2 (two) times daily with a meal., Disp: , Rfl:    Omega 3 1000 MG CAPS, Take 1 capsule by mouth daily., Disp: , Rfl:    omeprazole (PRILOSEC) 10 MG capsule, Take 10 mg by mouth daily., Disp: , Rfl:    OPSUMIT 10 MG tablet, TAKE 1 TABLET (10MG  TOTAL) BY MOUTH DAILY. PLEASE CALL TO SCHEDULE AN APPOINTMENT., Disp: 30 tablet, Rfl: 0   OXYGEN, Inhale 2 L into the lungs continuous., Disp: , Rfl:    sildenafil (REVATIO) 20 MG tablet, Take 4 tablets (80 mg total) by mouth 3 (three) times daily., Disp: 360 tablet, Rfl: 11   TRELEGY ELLIPTA 100-62.5-25 MCG/ACT AEPB, INHALE 1 PUFF BY MOUTH EVERY DAY, Disp: 180 each, Rfl: 4  Past Medical History: Past Medical History:  Diagnosis Date   Arthritis    Asthma  CHF (congestive heart failure) (HCC)    CKD (chronic kidney disease)    COPD (chronic obstructive pulmonary disease) (HCC)    dR. wERT   Diabetes mellitus (HCC)    type 2   Gout    Hyperlipidemia    Hypertension    Shortness of breath    WITH EXERTION    Sleep apnea    USES  CPAP    Stroke (HCC)     Tobacco Use: Social History   Tobacco Use  Smoking Status Former   Packs/day: 1.50   Years: 40.00   Additional pack years: 0.00   Total pack years: 60.00   Types: Cigarettes   Quit date: 11/16/2004   Years since quitting: 18.3  Smokeless Tobacco Former    Labs: Review Flowsheet  More data exists      Latest Ref Rng & Units 11/07/2018 07/25/2020 10/08/2020 02/10/2022 04/01/2023  Labs for ITP Cardiac and Pulmonary Rehab  Cholestrol 100 - 199  mg/dL 409  811  914  782  956   LDL (calc) 0 - 99 mg/dL 91  213  80  73  86   HDL-C >39 mg/dL 83  086  89  98  86   Trlycerides 0 - 149 mg/dL 56  47  58  32  61   Hemoglobin A1c 4.8 - 5.6 % - - - - 6.3     Capillary Blood Glucose: Lab Results  Component Value Date   GLUCAP 188 (H) 06/24/2017   GLUCAP 99 06/24/2017   GLUCAP 117 (H) 06/23/2017   GLUCAP 103 (H) 06/23/2017   GLUCAP 128 (H) 06/23/2017     Pulmonary Assessment Scores:  Pulmonary Assessment Scores     Row Name 04/05/23 1135         ADL UCSD   ADL Phase Entry     SOB Score total 76       CAT Score   CAT Score 26       mMRC Score   mMRC Score 4             UCSD: Self-administered rating of dyspnea associated with activities of daily living (ADLs) 6-point scale (0 = "not at all" to 5 = "maximal or unable to do because of breathlessness")  Scoring Scores range from 0 to 120.  Minimally important difference is 5 units  CAT: CAT can identify the health impairment of COPD patients and is better correlated with disease progression.  CAT has a scoring range of zero to 40. The CAT score is classified into four groups of low (less than 10), medium (10 - 20), high (21-30) and very high (31-40) based on the impact level of disease on health status. A CAT score over 10 suggests significant symptoms.  A worsening CAT score could be explained by an exacerbation, poor medication adherence, poor inhaler technique, or progression of COPD or comorbid conditions.  CAT MCID is 2 points  mMRC: mMRC (Modified Medical Research Council) Dyspnea Scale is used to assess the degree of baseline functional disability in patients of respiratory disease due to dyspnea. No minimal important difference is established. A decrease in score of 1 point or greater is considered a positive change.   Pulmonary Function Assessment:  Pulmonary Function Assessment - 04/05/23 1307       Breath   Bilateral Breath Sounds Decreased              Exercise Target Goals: Exercise Program Goal: Individual exercise prescription set using  results from initial 6 min walk test and THRR while considering  patient's activity barriers and safety.   Exercise Prescription Goal: Initial exercise prescription builds to 30-45 minutes a day of aerobic activity, 2-3 days per week.  Home exercise guidelines will be given to patient during program as part of exercise prescription that the participant will acknowledge.  Activity Barriers & Risk Stratification:  Activity Barriers & Cardiac Risk Stratification - 04/05/23 1125       Activity Barriers & Cardiac Risk Stratification   Activity Barriers History of Falls;Balance Concerns;Deconditioning;Muscular Weakness;Shortness of Breath;Arthritis             6 Minute Walk:  6 Minute Walk     Row Name 04/05/23 1250         6 Minute Walk   Phase Initial     Distance 575 feet     Walk Time 6 minutes     # of Rest Breaks 0     MPH 1.11     METS 1.11     RPE 17     Perceived Dyspnea  2     VO2 Peak 3.89     Symptoms Yes (comment)     Comments Dyspnea     Resting HR 67 bpm     Resting BP 160/68     Resting Oxygen Saturation  97 %     Exercise Oxygen Saturation  during 6 min walk 90 %     Max Ex. HR 80 bpm     Max Ex. BP 180/80     2 Minute Post BP 140/70       Interval HR   1 Minute HR 73     2 Minute HR 72     3 Minute HR 76     4 Minute HR 78     5 Minute HR 80     6 Minute HR 79     2 Minute Post HR 56     Interval Heart Rate? Yes       Interval Oxygen   Interval Oxygen? Yes     Baseline Oxygen Saturation % 97 %     1 Minute Oxygen Saturation % 98 %     1 Minute Liters of Oxygen 2 L     2 Minute Oxygen Saturation % 93 %     2 Minute Liters of Oxygen 2 L     3 Minute Oxygen Saturation % 93 %     3 Minute Liters of Oxygen 2 L     4 Minute Oxygen Saturation % 91 %     4 Minute Liters of Oxygen 2 L     5 Minute Oxygen Saturation % 92 %     5 Minute Liters of Oxygen  2 L     6 Minute Oxygen Saturation % 90 %     6 Minute Liters of Oxygen 2 L     2 Minute Post Oxygen Saturation % 95 %     2 Minute Post Liters of Oxygen 2 L              Oxygen Initial Assessment:  Oxygen Initial Assessment - 04/05/23 1117       Home Oxygen   Home Oxygen Device E-Tanks;Home Concentrator    Sleep Oxygen Prescription Continuous    Liters per minute 2    Home Exercise Oxygen Prescription Continuous    Liters per minute 2    Home Resting Oxygen Prescription  Continuous    Liters per minute 2    Compliance with Home Oxygen Use Yes      Initial 6 min Walk   Oxygen Used Continuous    Liters per minute 2      Program Oxygen Prescription   Program Oxygen Prescription Continuous    Liters per minute 2      Intervention   Short Term Goals To learn and exhibit compliance with exercise, home and travel O2 prescription;To learn and understand importance of maintaining oxygen saturations>88%;To learn and demonstrate proper use of respiratory medications;To learn and understand importance of monitoring SPO2 with pulse oximeter and demonstrate accurate use of the pulse oximeter.;To learn and demonstrate proper pursed lip breathing techniques or other breathing techniques.     Long  Term Goals Exhibits compliance with exercise, home  and travel O2 prescription;Verbalizes importance of monitoring SPO2 with pulse oximeter and return demonstration;Exhibits proper breathing techniques, such as pursed lip breathing or other method taught during program session;Maintenance of O2 saturations>88%;Compliance with respiratory medication;Demonstrates proper use of MDI's             Oxygen Re-Evaluation:   Oxygen Discharge (Final Oxygen Re-Evaluation):   Initial Exercise Prescription:  Initial Exercise Prescription - 04/05/23 1200       Date of Initial Exercise RX and Referring Provider   Date 04/05/23    Referring Provider Shirlee Latch    Expected Discharge Date 05/18/23       Oxygen   Oxygen Continuous    Liters 2    Maintain Oxygen Saturation 88% or higher      NuStep   Level 1    SPM 50    Minutes 30    METs 1      Prescription Details   Frequency (times per week) 2    Duration Progress to 30 minutes of continuous aerobic without signs/symptoms of physical distress      Intensity   THRR 40-80% of Max Heartrate 55-110    Ratings of Perceived Exertion 11-13    Perceived Dyspnea 0-4      Progression   Progression Continue progressive overload as per policy without signs/symptoms or physical distress.      Resistance Training   Training Prescription Yes    Weight red bands    Reps 10-15             Perform Capillary Blood Glucose checks as needed.  Exercise Prescription Changes:   Exercise Comments:   Exercise Goals and Review:   Exercise Goals     Row Name 04/05/23 1125             Exercise Goals   Increase Physical Activity Yes       Intervention Provide advice, education, support and counseling about physical activity/exercise needs.;Develop an individualized exercise prescription for aerobic and resistive training based on initial evaluation findings, risk stratification, comorbidities and participant's personal goals.       Expected Outcomes Short Term: Attend rehab on a regular basis to increase amount of physical activity.;Long Term: Add in home exercise to make exercise part of routine and to increase amount of physical activity.;Long Term: Exercising regularly at least 3-5 days a week.       Increase Strength and Stamina Yes       Intervention Provide advice, education, support and counseling about physical activity/exercise needs.;Develop an individualized exercise prescription for aerobic and resistive training based on initial evaluation findings, risk stratification, comorbidities and participant's personal goals.  Expected Outcomes Short Term: Increase workloads from initial exercise prescription for resistance,  speed, and METs.;Short Term: Perform resistance training exercises routinely during rehab and add in resistance training at home;Long Term: Improve cardiorespiratory fitness, muscular endurance and strength as measured by increased METs and functional capacity ( )       Able to understand and use rate of perceived exertion (RPE) scale Yes       Intervention Provide education and explanation on how to use RPE scale       Expected Outcomes Short Term: Able to use RPE daily in rehab to express subjective intensity level;Long Term:  Able to use RPE to guide intensity level when exercising independently       Able to understand and use Dyspnea scale Yes       Intervention Provide education and explanation on how to use Dyspnea scale       Expected Outcomes Short Term: Able to use Dyspnea scale daily in rehab to express subjective sense of shortness of breath during exertion;Long Term: Able to use Dyspnea scale to guide intensity level when exercising independently       Knowledge and understanding of Target Heart Rate Range (THRR) Yes       Intervention Provide education and explanation of THRR including how the numbers were predicted and where they are located for reference       Expected Outcomes Short Term: Able to state/look up THRR;Long Term: Able to use THRR to govern intensity when exercising independently;Short Term: Able to use daily as guideline for intensity in rehab       Understanding of Exercise Prescription Yes       Intervention Provide education, explanation, and written materials on patient's individual exercise prescription       Expected Outcomes Short Term: Able to explain program exercise prescription;Long Term: Able to explain home exercise prescription to exercise independently                Exercise Goals Re-Evaluation :   Discharge Exercise Prescription (Final Exercise Prescription Changes):   Nutrition:  Target Goals: Understanding of nutrition guidelines, daily  intake of sodium 1500mg , cholesterol 200mg , calories 30% from fat and 7% or less from saturated fats, daily to have 5 or more servings of fruits and vegetables.  Biometrics:  Pre Biometrics - 04/05/23 1303       Pre Biometrics   Grip Strength 18 kg              Nutrition Therapy Plan and Nutrition Goals:   Nutrition Assessments:  MEDIFICTS Score Key: ?70 Need to make dietary changes  40-70 Heart Healthy Diet ? 40 Therapeutic Level Cholesterol Diet   Picture Your Plate Scores: <16 Unhealthy dietary pattern with much room for improvement. 41-50 Dietary pattern unlikely to meet recommendations for good health and room for improvement. 51-60 More healthful dietary pattern, with some room for improvement.  >60 Healthy dietary pattern, although there may be some specific behaviors that could be improved.    Nutrition Goals Re-Evaluation:   Nutrition Goals Discharge (Final Nutrition Goals Re-Evaluation):   Psychosocial: Target Goals: Acknowledge presence or absence of significant depression and/or stress, maximize coping skills, provide positive support system. Participant is able to verbalize types and ability to use techniques and skills needed for reducing stress and depression.  Initial Review & Psychosocial Screening:  Initial Psych Review & Screening - 04/05/23 1130       Initial Review   Current issues with None Identified  Family Dynamics   Good Support System? Yes    Comments son and family      Barriers   Psychosocial barriers to participate in program There are no identifiable barriers or psychosocial needs.      Screening Interventions   Interventions Encouraged to exercise             Quality of Life Scores:  Scores of 19 and below usually indicate a poorer quality of life in these areas.  A difference of  2-3 points is a clinically meaningful difference.  A difference of 2-3 points in the total score of the Quality of Life Index has  been associated with significant improvement in overall quality of life, self-image, physical symptoms, and general health in studies assessing change in quality of life.  PHQ-9: Review Flowsheet       04/05/2023 04/01/2023  Depression screen PHQ 2/9  Decreased Interest 1 1  Down, Depressed, Hopeless 0 0  PHQ - 2 Score 1 1  Altered sleeping 0 2  Tired, decreased energy 0 2  Change in appetite 0 0  Feeling bad or failure about yourself  0 0  Trouble concentrating 0 0  Moving slowly or fidgety/restless 0 0  Suicidal thoughts 0 0  PHQ-9 Score 1 5  Difficult doing work/chores Somewhat difficult Somewhat difficult   Interpretation of Total Score  Total Score Depression Severity:  1-4 = Minimal depression, 5-9 = Mild depression, 10-14 = Moderate depression, 15-19 = Moderately severe depression, 20-27 = Severe depression   Psychosocial Evaluation and Intervention:  Psychosocial Evaluation - 04/05/23 1131       Psychosocial Evaluation & Interventions   Interventions Stress management education;Relaxation education;Encouraged to exercise with the program and follow exercise prescription    Comments Son accompanied pt to orientation. Denies psychosocial barriers to exercise.    Expected Outcomes For Vannie to participate in rehab free of psychosocial barriers.    Continue Psychosocial Services  No Follow up required             Psychosocial Re-Evaluation:   Psychosocial Discharge (Final Psychosocial Re-Evaluation):   Education: Education Goals: Education classes will be provided on a weekly basis, covering required topics. Participant will state understanding/return demonstration of topics presented.  Learning Barriers/Preferences:  Learning Barriers/Preferences - 04/05/23 1126       Learning Barriers/Preferences   Learning Barriers None    Learning Preferences None             Education Topics: Introduction to Pulmonary Rehab Group instruction provided by  PowerPoint, verbal discussion, and written material to support subject matter. Instructor reviews what Pulmonary Rehab is, the purpose of the program, and how patients are referred.     Know Your Numbers Group instruction that is supported by a PowerPoint presentation. Instructor discusses importance of knowing and understanding resting, exercise, and post-exercise oxygen saturation, heart rate, and blood pressure. Oxygen saturation, heart rate, blood pressure, rating of perceived exertion, and dyspnea are reviewed along with a normal range for these values.    Exercise for the Pulmonary Patient Group instruction that is supported by a PowerPoint presentation. Instructor discusses benefits of exercise, core components of exercise, frequency, duration, and intensity of an exercise routine, importance of utilizing pulse oximetry during exercise, safety while exercising, and options of places to exercise outside of rehab.    MET Level  Group instruction provided by PowerPoint, verbal discussion, and written material to support subject matter. Instructor reviews what METs are and how to increase  METs.    Pulmonary Medications Verbally interactive group education provided by instructor with focus on inhaled medications and proper administration.   Anatomy and Physiology of the Respiratory System Group instruction provided by PowerPoint, verbal discussion, and written material to support subject matter. Instructor reviews respiratory cycle and anatomical components of the respiratory system and their functions. Instructor also reviews differences in obstructive and restrictive respiratory diseases with examples of each.    Oxygen Safety Group instruction provided by PowerPoint, verbal discussion, and written material to support subject matter. There is an overview of "What is Oxygen" and "Why do we need it".  Instructor also reviews how to create a safe environment for oxygen use, the importance of  using oxygen as prescribed, and the risks of noncompliance. There is a brief discussion on traveling with oxygen and resources the patient may utilize.   Oxygen Use Group instruction provided by PowerPoint, verbal discussion, and written material to discuss how supplemental oxygen is prescribed and different types of oxygen supply systems. Resources for more information are provided.    Breathing Techniques Group instruction that is supported by demonstration and informational handouts. Instructor discusses the benefits of pursed lip and diaphragmatic breathing and detailed demonstration on how to perform both.     Risk Factor Reduction Group instruction that is supported by a PowerPoint presentation. Instructor discusses the definition of a risk factor, different risk factors for pulmonary disease, and how the heart and lungs work together.   MD Day A group question and answer session with a medical doctor that allows participants to ask questions that relate to their pulmonary disease state.   Nutrition for the Pulmonary Patient Group instruction provided by PowerPoint slides, verbal discussion, and written materials to support subject matter. The instructor gives an explanation and review of healthy diet recommendations, which includes a discussion on weight management, recommendations for fruit and vegetable consumption, as well as protein, fluid, caffeine, fiber, sodium, sugar, and alcohol. Tips for eating when patients are short of breath are discussed.    Other Education Group or individual verbal, written, or video instructions that support the educational goals of the pulmonary rehab program.    Knowledge Questionnaire Score:  Knowledge Questionnaire Score - 04/05/23 1132       Knowledge Questionnaire Score   Pre Score 14/18             Core Components/Risk Factors/Patient Goals at Admission:  Personal Goals and Risk Factors at Admission - 04/05/23 1127       Core  Components/Risk Factors/Patient Goals on Admission   Improve shortness of breath with ADL's Yes    Intervention Provide education, individualized exercise plan and daily activity instruction to help decrease symptoms of SOB with activities of daily living.    Expected Outcomes Short Term: Improve cardiorespiratory fitness to achieve a reduction of symptoms when performing ADLs;Long Term: Be able to perform more ADLs without symptoms or delay the onset of symptoms    Increase knowledge of respiratory medications and ability to use respiratory devices properly  Yes    Intervention Provide education and demonstration as needed of appropriate use of medications, inhalers, and oxygen therapy.    Expected Outcomes Short Term: Achieves understanding of medications use. Understands that oxygen is a medication prescribed by physician. Demonstrates appropriate use of inhaler and oxygen therapy.;Long Term: Maintain appropriate use of medications, inhalers, and oxygen therapy.    Heart Failure Yes    Intervention Provide a combined exercise and nutrition program that is supplemented  with education, support and counseling about heart failure. Directed toward relieving symptoms such as shortness of breath, decreased exercise tolerance, and extremity edema.    Expected Outcomes Improve functional capacity of life;Short term: Attendance in program 2-3 days a week with increased exercise capacity. Reported lower sodium intake. Reported increased fruit and vegetable intake. Reports medication compliance.;Short term: Daily weights obtained and reported for increase. Utilizing diuretic protocols set by physician.;Long term: Adoption of self-care skills and reduction of barriers for early signs and symptoms recognition and intervention leading to self-care maintenance.    Hypertension Yes    Intervention Provide education on lifestyle modifcations including regular physical activity/exercise, weight management, moderate  sodium restriction and increased consumption of fresh fruit, vegetables, and low fat dairy, alcohol moderation, and smoking cessation.;Monitor prescription use compliance.    Expected Outcomes Short Term: Continued assessment and intervention until BP is < 140/21mm HG in hypertensive participants. < 130/68mm HG in hypertensive participants with diabetes, heart failure or chronic kidney disease.;Long Term: Maintenance of blood pressure at goal levels.             Core Components/Risk Factors/Patient Goals Review:    Core Components/Risk Factors/Patient Goals at Discharge (Final Review):    ITP Comments:   Comments: Dr. Mechele Collin is Medical Director for Pulmonary Rehab at [facility].

## 2023-04-07 ENCOUNTER — Telehealth (HOSPITAL_COMMUNITY): Payer: Self-pay | Admitting: Licensed Clinical Social Worker

## 2023-04-07 NOTE — Telephone Encounter (Signed)
H&V Care Navigation CSW Progress Note  Clinical Social Worker received consult from pulmonary rehab regarding pt SDOH concerns with paying for electric bill, transportation concerns, and concerns with paying for rehab copays.  CSW spoke with pt regarding above concerns.  Pt reports that someone through insurance brought her to rehab before and that they were scheduled to bring her to next session- doesn't think she needs any further assistance with arranging transportation at this time.  States that electric bill is behind by about $450 after she had two large charges in the same month regarding her house.  States her son and dtr are planning to pay it for her so she doesn't want to pursue patient care fund assistance at this time- is adamant that she take care of it herself before asking for outside assistance.  Has $20 copays for each session of pulmonary rehab and she is worried about this cost adding up (would be about $160/month).  CSW explained that we could not pay for medical copays but that we might could help with another expense to free up funds for copay.  Also mentioned that accounting could create a payment plan for her which would mean smaller payment amounts.  Provided pt with CSW number to reach out if she is in need of assistance in the future.   SDOH Screenings   Utilities: At Risk (04/07/2023)  Depression (PHQ2-9): Low Risk  (04/05/2023)  Recent Concern: Depression (PHQ2-9) - Medium Risk (04/01/2023)  Financial Resource Strain: Medium Risk (04/07/2023)  Tobacco Use: Medium Risk (04/05/2023)    Burna Sis, LCSW Clinical Social Worker [facility] Desk#: [phone] Cell#: [phone]

## 2023-04-10 ENCOUNTER — Other Ambulatory Visit (HOSPITAL_COMMUNITY): Payer: Self-pay | Admitting: Cardiology

## 2023-04-10 DIAGNOSIS — J810 Acute pulmonary edema: Secondary | ICD-10-CM

## 2023-04-13 ENCOUNTER — Encounter (HOSPITAL_COMMUNITY)
Admission: RE | Admit: 2023-04-13 | Discharge: 2023-04-13 | Disposition: A | Discharge status: Home / Self Care | Payer: Medicare HMO | Source: Ambulatory Visit | Attending: Cardiology | Admitting: Cardiology

## 2023-04-13 VITALS — Wt 116.6 lb

## 2023-04-13 DIAGNOSIS — J449 Chronic obstructive pulmonary disease, unspecified: Secondary | ICD-10-CM | POA: Diagnosis not present

## 2023-04-13 NOTE — Progress Notes (Signed)
Daily Session Note  Patient Details  Name: Beth [name] MRN: [id number] Date of Birth: 1941/09/06 Referring Provider:   Doristine Devoid Pulmonary Rehab Walk Test from 04/05/2023 in [facility] for Heart, Vascular, & Lung Health  Referring Provider Shirlee Latch       Encounter Date: 04/13/2023  Check In:  Session Check In - 04/13/23 1431       Check-In   Supervising physician immediately available to respond to emergencies [facility] MD immediately available    Physician(s) Robin Searing, NP    Location [facility]    Staff Present Samantha Belarus, RD, Dutch Gray, RN, BSN;Randi Idelle Crouch BS, ACSM-CEP, Exercise Physiologist;Marra Fraga Earlene Plater, MS, ACSM-CEP, Exercise Physiologist;Casey Katrinka Blazing, RT    Virtual Visit No    Medication changes reported     No    Fall or balance concerns reported    No    Tobacco Cessation No Change    Warm-up and Cool-down Performed as group-led instruction    Resistance Training Performed Yes    VAD Patient? No    PAD/SET Patient? No      Pain Assessment   Currently in Pain? No/denies    Multiple Pain Sites No             Capillary Blood Glucose: No results found for this or any previous visit (from the past 24 hour(s)).   Exercise Prescription Changes - 04/13/23 1500       Response to Exercise   Blood Pressure (Admit) 136/70    Blood Pressure (Exercise) 140/84    Blood Pressure (Exit) 110/54    Heart Rate (Admit) 78 bpm    Heart Rate (Exercise) 76 bpm    Heart Rate (Exit) 63 bpm    Oxygen Saturation (Admit) 95 %    Oxygen Saturation (Exercise) 95 %    Oxygen Saturation (Exit) 98 %    Rating of Perceived Exertion (Exercise) 10    Perceived Dyspnea (Exercise) 0    Duration Progress to 30 minutes of  aerobic without signs/symptoms of physical distress    Intensity THRR unchanged      Progression   Progression Continue to progress workloads to maintain intensity without signs/symptoms of physical distress.       Resistance Training   Training Prescription Yes    Weight red bands    Reps 10-15    Time 10 Minutes      Oxygen   Oxygen Continuous    Liters 2      NuStep   Level 1    SPM 50    Minutes 30    METs 1.4      Oxygen   Maintain Oxygen Saturation 88% or higher             Social History   Tobacco Use  Smoking Status Former   Packs/day: 1.50   Years: 40.00   Additional pack years: 0.00   Total pack years: 60.00   Types: Cigarettes   Quit date: 11/16/2004   Years since quitting: 18.4  Smokeless Tobacco Former    Goals Met:  Proper associated with RPD/PD & O2 Sat Exercise tolerated well No report of concerns or symptoms today Strength training completed today  Goals Unmet:  Not Applicable  Comments: Service time is from 1332 to 1455.    Dr. Mechele Collin is Medical Director for Pulmonary Rehab at [facility].

## 2023-04-14 NOTE — Progress Notes (Signed)
Please call patient, inquire if she sees a nephrologist? Kidney function is getting lower, needs a referral. Thanks

## 2023-04-15 ENCOUNTER — Encounter (HOSPITAL_COMMUNITY)
Admission: RE | Admit: 2023-04-15 | Discharge: 2023-04-15 | Disposition: A | Discharge status: Home / Self Care | Payer: Medicare HMO | Source: Ambulatory Visit | Attending: Cardiology | Admitting: Cardiology

## 2023-04-15 DIAGNOSIS — J449 Chronic obstructive pulmonary disease, unspecified: Secondary | ICD-10-CM | POA: Diagnosis not present

## 2023-04-15 NOTE — Progress Notes (Signed)
Daily Session Note  Patient Details  Name: WYKISHA OTTESON MRN: [id number] Date of Birth: 1941/10/09 Referring Provider:   Doristine Devoid Pulmonary Rehab Walk Test from 04/05/2023 in [facility] for Heart, Vascular, & Lung Health  Referring Provider Shirlee Latch       Encounter Date: 04/15/2023  Check In:  Session Check In - 04/15/23 1415       Check-In   Supervising physician immediately available to respond to emergencies [facility] MD immediately available    Physician(s) Edd Fabian, NP    [facility]    Staff Present Samantha Belarus, RD, Dutch Gray, RN, BSN;Randi Reeve BS, ACSM-CEP, Exercise Physiologist;Kaylee Earlene Plater, MS, ACSM-CEP, Exercise Physiologist;Casey Heloise Ochoa, MS, ACSM-CEP, CCRP, Exercise Physiologist    Virtual Visit No    Medication changes reported     No    Fall or balance concerns reported    No    Tobacco Cessation No Change    Warm-up and Cool-down Performed as group-led instruction    Resistance Training Performed Yes    VAD Patient? No    PAD/SET Patient? No      Pain Assessment   Currently in Pain? No/denies    Pain Score 0-No pain    Multiple Pain Sites No             Capillary Blood Glucose: No results found for this or any previous visit (from the past 24 hour(s)).    Social History   Tobacco Use  Smoking Status Former   Packs/day: 1.50   Years: 40.00   Additional pack years: 0.00   Total pack years: 60.00   Types: Cigarettes   Quit date: 11/16/2004   Years since quitting: 18.4  Smokeless Tobacco Former    Goals Met:  Exercise tolerated well Strength training completed today  Goals Unmet:  BP, pt's BP checked before exercising, 162/84. Checked BP on first exercise station, 200/102. Informed pt to stop exercising and to rest. BP rechecked after 10-38min, 156/72. Pt resumed exercise. BP on check out was 132/74.  Comments: Service time is from 39 to 38    Dr. Mechele Collin is Medical Director for Pulmonary Rehab at [facility].

## 2023-04-19 ENCOUNTER — Other Ambulatory Visit: Payer: Self-pay | Admitting: Internal Medicine

## 2023-04-19 DIAGNOSIS — J9611 Chronic respiratory failure with hypoxia: Secondary | ICD-10-CM

## 2023-04-19 NOTE — Assessment & Plan Note (Signed)
Lavage was done in the office, it was successful and well tolerated by the patient

## 2023-04-20 ENCOUNTER — Encounter (HOSPITAL_COMMUNITY)
Admission: RE | Admit: 2023-04-20 | Discharge: 2023-04-20 | Disposition: A | Discharge status: Home / Self Care | Payer: Medicare HMO | Source: Ambulatory Visit | Attending: Cardiology | Admitting: Cardiology

## 2023-04-20 ENCOUNTER — Telehealth: Payer: Self-pay | Admitting: Internal Medicine

## 2023-04-20 DIAGNOSIS — J449 Chronic obstructive pulmonary disease, unspecified: Secondary | ICD-10-CM | POA: Insufficient documentation

## 2023-04-20 DIAGNOSIS — J9611 Chronic respiratory failure with hypoxia: Secondary | ICD-10-CM

## 2023-04-20 NOTE — Progress Notes (Signed)
Daily Session Note  Patient Details  Name: Beth [name] MRN: [id number] Date of Birth: [birth year] Referring Provider:   Doristine Devoid Pulmonary Rehab Walk Test from 04/05/2023 in [facility] for Heart, Vascular, & Lung Health  Referring Provider Shirlee Latch       Encounter Date: 04/20/2023  Check In:  Session Check In - 04/20/23 1423       Check-In   Supervising physician immediately available to respond to emergencies [facility] MD immediately available    Physician(s) Carlyon Shadow, NP    [facility]    Staff Present Samantha Belarus, RD, LDN;Randi Dionisio Paschal, ACSM-CEP, Exercise Physiologist;Emoni Whitworth Earlene Plater, MS, ACSM-CEP, Exercise Physiologist;David Manus Gunning, MS, ACSM-CEP, CCRP, Exercise Physiologist;Casey Katrinka Blazing, RT    Virtual Visit No    Medication changes reported     No    Fall or balance concerns reported    No    Tobacco Cessation No Change    Warm-up and Cool-down Performed as group-led instruction    Resistance Training Performed Yes    VAD Patient? No    PAD/SET Patient? No      Pain Assessment   Currently in Pain? No/denies    Multiple Pain Sites No             Capillary Blood Glucose: No results found for this or any previous visit (from the past 24 hour(s)).    Social History   Tobacco Use  Smoking Status Former   Packs/day: 1.50   Years: 40.00   Additional pack years: 0.00   Total pack years: 60.00   Types: Cigarettes   Quit date: 11/16/2004   Years since quitting: 18.4  Smokeless Tobacco Former    Goals Met:  Proper associated with RPD/PD & O2 Sat Exercise tolerated well No report of concerns or symptoms today Strength training completed today  Goals Unmet:  Not Applicable  Comments: Service time is from 1314 to 1437.    Dr. Mechele Collin is Medical Director for Pulmonary Rehab at [facility].

## 2023-04-20 NOTE — Telephone Encounter (Signed)
Per message from [place] with [employer] the 02 for this patient needs to be fixed current order states The order states Please send order to [employer] for best fit eval for portable o2  Has been walked with pulm rehab team most recently  Pt uses 2lpm o2 Correct order should be like  Okay in order for [place] to eval pt we will need to order updated with the below verbiage.   "If new O2 start,, Please evaluate and titrate for best fit POC or portable O2 system. RT to evaluate and titrate patient for POC or Homefill with OCD maintain sats >/=90%. if patient qualifies, dispense POC or homefill with OCD 1-5 pulse dose."

## 2023-04-21 NOTE — Telephone Encounter (Signed)
Sent in new order for POC with corrections per Elease Hashimoto at [employer].  Patient most recent 6 minute walk was Pulmonary Rehab Walk Test on 04/05/2023

## 2023-04-22 ENCOUNTER — Encounter (HOSPITAL_COMMUNITY)
Admission: RE | Admit: 2023-04-22 | Discharge: 2023-04-22 | Disposition: A | Discharge status: Home / Self Care | Payer: Medicare HMO | Source: Ambulatory Visit | Attending: Cardiology | Admitting: Cardiology

## 2023-04-22 DIAGNOSIS — J449 Chronic obstructive pulmonary disease, unspecified: Secondary | ICD-10-CM

## 2023-04-22 NOTE — Progress Notes (Signed)
Daily Session Note  Patient Details  Name: Beth [name] MRN: [id number] Date of Birth: 1941-09-27 Referring Provider:   Doristine Devoid Pulmonary Rehab Walk Test from 04/05/2023 in [facility] for Heart, Vascular, & Lung Health  Referring Provider Shirlee Latch       Encounter Date: 04/22/2023  Check In:  Session Check In - 04/22/23 1432       Check-In   Supervising physician immediately available to respond to emergencies [facility] immediately available    Physician(s) Jari Favre, PA    [facility]    Staff Present Samantha Belarus, RD, Gaylord Shih, MS, Exercise Physiologist;Randi Dionisio Paschal, ACSM-CEP, Exercise Physiologist;Kaylee Earlene Plater, MS, ACSM-CEP, Exercise Physiologist;Willliam Pettet Katrinka Blazing, RT    Virtual Visit No    Medication changes reported     No    Fall or balance concerns reported    No    Tobacco Cessation No Change    Warm-up and Cool-down Performed as group-led instruction    Resistance Training Performed Yes    VAD Patient? No    PAD/SET Patient? No      Pain Assessment   Currently in Pain? No/denies    Multiple Pain Sites No             Capillary Blood Glucose: No results found for this or any previous visit (from the past 24 hour(s)).    Social History   Tobacco Use  Smoking Status Former   Packs/day: 1.50   Years: 40.00   Additional pack years: 0.00   Total pack years: 60.00   Types: Cigarettes   Quit date: 11/16/2004   Years since quitting: 18.4  Smokeless Tobacco Former    Goals Met:  Proper associated with RPD/PD & O2 Sat Independence with exercise equipment Exercise tolerated well No report of concerns or symptoms today Strength training completed today  Goals Unmet:  Not Applicable  Comments: Service time is from 1310 to 1448.    Dr. Mechele Collin is Medical Director for Pulmonary Rehab at [facility].

## 2023-04-27 ENCOUNTER — Encounter (HOSPITAL_COMMUNITY)
Admission: RE | Admit: 2023-04-27 | Discharge: 2023-04-27 | Disposition: A | Discharge status: Home / Self Care | Payer: Medicare HMO | Source: Ambulatory Visit | Attending: Cardiology | Admitting: Cardiology

## 2023-04-27 VITALS — Wt 116.8 lb

## 2023-04-27 DIAGNOSIS — J449 Chronic obstructive pulmonary disease, unspecified: Secondary | ICD-10-CM

## 2023-04-27 NOTE — Progress Notes (Signed)
Home Exercise Prescription I have reviewed a Home Exercise Prescription with Addilyn R Bernhard. She is currently not exercising. She sts she can try to start walking in house for longer duration. Encouraged pt to start with 5-10 min 1-2 days a week and add time as able. The patient stated that their goals were to keep enjoying life and [facility] the [facility] center. We reviewed exercise guidelines, target heart rate during exercise, RPE Scale, weather conditions, endpoints for exercise, warmup and cool down. The patient is encouraged to come to me with any questions. I will continue to follow up with the patient to assist them with progression and safety.  Spent 15 min discussing home exercise plan and goals.  Alvino Lechuga [place], [place], ACSM-CEP 04/27/2023 3:44 PM

## 2023-04-27 NOTE — Progress Notes (Signed)
Daily Session Note  Patient Details  Name: Beth [name] MRN: [id number] Date of Birth: 07/05/1941 Referring Provider:   Doristine Devoid Pulmonary Rehab Walk Test from 04/05/2023 in [facility] for Heart, Vascular, & Lung Health  Referring Provider Shirlee Latch       Encounter Date: 04/27/2023  Check In:  Session Check In - 04/27/23 1415       Check-In   Supervising physician immediately available to respond to emergencies [facility] MD immediately available    Physician(s) Jari Favre, PA    [facility]    Staff Present Elissa Lovett BS, ACSM-CEP, Exercise Physiologist;Kaylee Earlene Plater, MS, ACSM-CEP, Exercise Physiologist;Camar [place]ton Robina Ade, RN, MSN;Mary Bastin, RN, BSN    Virtual Visit No    Medication changes reported     No    Fall or balance concerns reported    No    Tobacco Cessation No Change    Warm-up and Cool-down Performed as group-led instruction    Resistance Training Performed Yes    VAD Patient? No    PAD/SET Patient? No      Pain Assessment   Currently in Pain? No/denies    Pain Score 0-No pain    Multiple Pain Sites No             Capillary Blood Glucose: No results found for this or any previous visit (from the past 24 hour(s)).   Exercise Prescription Changes - 04/27/23 1500       Response to Exercise   Blood Pressure (Admit) 172/70    Blood Pressure (Exercise) 170/80    Blood Pressure (Exit) 142/82    Heart Rate (Admit) 72 bpm    Heart Rate (Exercise) 85 bpm    Heart Rate (Exit) 75 bpm    Oxygen Saturation (Admit) 100 %    Oxygen Saturation (Exercise) 95 %    Oxygen Saturation (Exit) 92 %    Rating of Perceived Exertion (Exercise) 10    Perceived Dyspnea (Exercise) 3    Duration Progress to 30 minutes of  aerobic without signs/symptoms of physical distress    Intensity THRR unchanged      Progression   Progression Continue to progress workloads to maintain intensity without signs/symptoms of  physical distress.      Resistance Training   Training Prescription Yes    Weight red bands    Reps 10-15    Time 10 Minutes      Oxygen   Oxygen Continuous    Liters 2      NuStep   Level 1    Minutes 30    METs 1.6      Oxygen   Maintain Oxygen Saturation 88% or higher             Social History   Tobacco Use  Smoking Status Former   Packs/day: 1.50   Years: 40.00   Additional pack years: 0.00   Total pack years: 60.00   Types: Cigarettes   Quit date: 11/16/2004   Years since quitting: 18.4  Smokeless Tobacco Former    Goals Met:  Proper associated with RPD/PD & O2 Sat Independence with exercise equipment Exercise tolerated well No report of concerns or symptoms today Strength training completed today  Goals Unmet:  Not Applicable  Comments: Service time is from 1307 to 1445.    Dr. Mechele Collin is Medical Director for Pulmonary Rehab at [facility]

## 2023-04-28 ENCOUNTER — Other Ambulatory Visit (HOSPITAL_COMMUNITY): Payer: Self-pay | Admitting: Cardiology

## 2023-04-28 NOTE — Progress Notes (Signed)
Pulmonary Individual Treatment Plan  Patient Details  Name: Beth [name] MRN: [id number] Date of Birth: November 14, 1941 Referring Provider:   Doristine Devoid Pulmonary Rehab Walk Test from 04/05/2023 in [facility]  Referring Provider [facility]       Initial Encounter Date:  Flowsheet Row Pulmonary Rehab Walk Test from 04/05/2023 in [facility]  Date 04/05/23       Visit Diagnosis: Stage 2 moderate COPD by GOLD classification (HCC)  Patient's Home Medications on Admission:   Current Outpatient Medications:    albuterol (PROVENTIL) (2.5 MG/3ML) 0.083% nebulizer solution, Take 2.5 mg by nebulization every 6 (six) hours as needed for wheezing or shortness of breath., Disp: , Rfl:    albuterol (VENTOLIN HFA) 108 (90 Base) MCG/ACT inhaler, INHALE 2 PUFFS INTO THE LUNGS EVERY 4 HOURS AS NEEDED ONLY IF YOU CANT CATCH YOUR BREATH, Disp: 18 Inhaler, Rfl: 2   allopurinol (ZYLOPRIM) 300 MG tablet, Take 300 mg by mouth daily., Disp: , Rfl:    Ascorbic Acid (VITAMIN C) 1000 MG tablet, Take 1,000 mg by mouth daily., Disp: , Rfl:    ASPIRIN LOW DOSE 81 MG tablet, TAKE 1 TABLET BY MOUTH EVERY DAY, Disp: 90 tablet, Rfl: 3   atorvastatin (LIPITOR) 80 MG tablet, TAKE 1 TABLET BY MOUTH EVERY DAY, Disp: 90 tablet, Rfl: 0   b complex vitamins tablet, Take 1 tablet by mouth daily., Disp: , Rfl:    bisoprolol (ZEBETA) 10 MG tablet, Take 1 tablet (10 mg total) by mouth daily., Disp: 90 tablet, Rfl: 3   Cholecalciferol (VITAMIN D3) 1000 units CAPS, Take 1,000 Units by mouth daily., Disp: , Rfl:    clobetasol cream (TEMOVATE) 0.05 %, Apply 1 application topically as needed. , Disp: , Rfl:    co-enzyme Q-10 30 MG capsule, Take 30 mg by mouth daily., Disp: , Rfl:    ezetimibe (ZETIA) 10 MG tablet, Take 1 tablet (10 mg total) by mouth daily., Disp: 30 tablet, Rfl: 2   furosemide (LASIX) 40 MG tablet, TAKE  2 TABLETS BY MOUTH EVERY MORNING. CAN TAKE EXTRA DOSE EVERY OTHER AFTERNOON WITH WEIGHT GAIN., Disp: 270 tablet, Rfl: 3   KLOR-CON M20 20 MEQ tablet, TAKE 3 TABLETS (60 MEQ TOTAL) BY MOUTH DAILY., Disp: 270 tablet, Rfl: 3   loratadine (CLARITIN) 10 MG tablet, Take 10 mg by mouth as needed. , Disp: , Rfl:    meloxicam (MOBIC) 15 MG tablet, Take 1 tablet by mouth as needed., Disp: , Rfl:    metFORMIN (GLUCOPHAGE) 500 MG tablet, Take 500 mg by mouth 2 (two) times daily with a meal., Disp: , Rfl:    Omega 3 1000 MG CAPS, Take 1 capsule by mouth daily., Disp: , Rfl:    omeprazole (PRILOSEC) 10 MG capsule, Take 10 mg by mouth daily., Disp: , Rfl:    OPSUMIT 10 MG tablet, TAKE 1 TABLET (10MG ) BY MOUTH DAILY. PLEASE CALL TO SCHEDULE AN APPOINTMENT, Disp: 30 tablet, Rfl: 0   OXYGEN, Inhale 2 L into the lungs continuous., Disp: , Rfl:    sildenafil (REVATIO) 20 MG tablet, Take 4 tablets (80 mg total) by mouth 3 (three) times daily., Disp: 360 tablet, Rfl: 11   TRELEGY ELLIPTA 100-62.5-25 MCG/ACT AEPB, INHALE 1 PUFF BY MOUTH EVERY DAY, Disp: 180 each, Rfl: 4  Past Medical History: Past Medical History:  Diagnosis Date   Arthritis    Asthma  CHF (congestive heart failure) (HCC)    CKD (chronic kidney disease)    COPD (chronic obstructive pulmonary disease) (HCC)    dR. wERT   Diabetes mellitus (HCC)    type 2   Gout    Hyperlipidemia    Hypertension    Shortness of breath    WITH EXERTION    Sleep apnea    USES  CPAP    Stroke (HCC)     Tobacco Use: Social History   Tobacco Use  Smoking Status Former   Packs/day: 1.50   Years: 40.00   Additional pack years: 0.00   Total pack years: 60.00   Types: Cigarettes   Quit date: 11/16/2004   Years since quitting: 18.4  Smokeless Tobacco Former    Labs: Review Flowsheet  More data exists      Latest Ref Rng & Units 11/07/2018 07/25/2020 10/08/2020 02/10/2022 04/01/2023  Labs for ITP Cardiac and Pulmonary Rehab  Cholestrol 100 - 199 mg/dL  161  096  045  409  811   LDL (calc) 0 - 99 mg/dL 91  914  80  73  86   HDL-C >39 mg/dL 83  782  89  98  86   Trlycerides 0 - 149 mg/dL 56  47  58  32  61   Hemoglobin A1c 4.8 - 5.6 % - - - - 6.3     Capillary Blood Glucose: Lab Results  Component Value Date   GLUCAP 188 (H) 06/24/2017   GLUCAP 99 06/24/2017   GLUCAP 117 (H) 06/23/2017   GLUCAP 103 (H) 06/23/2017   GLUCAP 128 (H) 06/23/2017     Pulmonary Assessment Scores:  Pulmonary Assessment Scores     Row Name 04/05/23 1135         ADL UCSD   ADL Phase Entry     SOB Score total 76       CAT Score   CAT Score 26       mMRC Score   mMRC Score 4             UCSD: Self-administered rating of dyspnea associated with activities of daily living (ADLs) 6-point scale (0 = "not at all" to 5 = "maximal or unable to do because of breathlessness")  Scoring Scores range from 0 to 120.  Minimally important difference is 5 units  CAT: CAT can identify the health impairment of COPD patients and is better correlated with disease progression.  CAT has a scoring range of zero to 40. The CAT score is classified into four groups of low (less than 10), medium (10 - 20), high (21-30) and very high (31-40) based on the impact level of disease on health status. A CAT score over 10 suggests significant symptoms.  A worsening CAT score could be explained by an exacerbation, poor medication adherence, poor inhaler technique, or progression of COPD or comorbid conditions.  CAT MCID is 2 points  mMRC: mMRC (Modified Medical Research Council) Dyspnea Scale is used to assess the degree of baseline functional disability in patients of respiratory disease due to dyspnea. No minimal important difference is established. A decrease in score of 1 point or greater is considered a positive change.   Pulmonary Function Assessment:  Pulmonary Function Assessment - 04/05/23 1307       Breath   Bilateral Breath Sounds Decreased              Exercise Target Goals: Exercise Program Goal: Individual exercise prescription set using  results from initial 6 min walk test and THRR while considering  patient's activity barriers and safety.   Exercise Prescription Goal: Initial exercise prescription builds to 30-45 minutes a day of aerobic activity, 2-3 days per week.  Home exercise guidelines will be given to patient during program as part of exercise prescription that the participant will acknowledge.  Activity Barriers & Risk Stratification:  Activity Barriers & Cardiac Risk Stratification - 04/05/23 1125       Activity Barriers & Cardiac Risk Stratification   Activity Barriers History of Falls;Balance Concerns;Deconditioning;Muscular Weakness;Shortness of Breath;Arthritis             6 Minute Walk:  6 Minute Walk     Row Name 04/05/23 1250         6 Minute Walk   Phase Initial     Distance 575 feet     Walk Time 6 minutes     # of Rest Breaks 0     MPH 1.11     METS 1.11     RPE 17     Perceived Dyspnea  2     VO2 Peak 3.89     Symptoms Yes (comment)     Comments Dyspnea     Resting HR 67 bpm     Resting BP 160/68     Resting Oxygen Saturation  97 %     Exercise Oxygen Saturation  during 6 min walk 90 %     Max Ex. HR 80 bpm     Max Ex. BP 180/80     2 Minute Post BP 140/70       Interval HR   1 Minute HR 73     2 Minute HR 72     3 Minute HR 76     4 Minute HR 78     5 Minute HR 80     6 Minute HR 79     2 Minute Post HR 56     Interval Heart Rate? Yes       Interval Oxygen   Interval Oxygen? Yes     Baseline Oxygen Saturation % 97 %     1 Minute Oxygen Saturation % 98 %     1 Minute Liters of Oxygen 2 L     2 Minute Oxygen Saturation % 93 %     2 Minute Liters of Oxygen 2 L     3 Minute Oxygen Saturation % 93 %     3 Minute Liters of Oxygen 2 L     4 Minute Oxygen Saturation % 91 %     4 Minute Liters of Oxygen 2 L     5 Minute Oxygen Saturation % 92 %     5 Minute Liters of Oxygen  2 L     6 Minute Oxygen Saturation % 90 %     6 Minute Liters of Oxygen 2 L     2 Minute Post Oxygen Saturation % 95 %     2 Minute Post Liters of Oxygen 2 L              Oxygen Initial Assessment:  Oxygen Initial Assessment - 04/05/23 1117       Home Oxygen   Home Oxygen Device E-Tanks;Home Concentrator    Sleep Oxygen Prescription Continuous    Liters per minute 2    Home Exercise Oxygen Prescription Continuous    Liters per minute 2    Home Resting Oxygen Prescription  Continuous    Liters per minute 2    Compliance with Home Oxygen Use Yes      Initial 6 min Walk   Oxygen Used Continuous    Liters per minute 2      Program Oxygen Prescription   Program Oxygen Prescription Continuous    Liters per minute 2      Intervention   Short Term Goals To learn and exhibit compliance with exercise, home and travel O2 prescription;To learn and understand importance of maintaining oxygen saturations>88%;To learn and demonstrate proper use of respiratory medications;To learn and understand importance of monitoring SPO2 with pulse oximeter and demonstrate accurate use of the pulse oximeter.;To learn and demonstrate proper pursed lip breathing techniques or other breathing techniques.     Long  Term Goals Exhibits compliance with exercise, home  and travel O2 prescription;Verbalizes importance of monitoring SPO2 with pulse oximeter and return demonstration;Exhibits proper breathing techniques, such as pursed lip breathing or other method taught during program session;Maintenance of O2 saturations>88%;Compliance with respiratory medication;Demonstrates proper use of MDI's             Oxygen Re-Evaluation:  Oxygen Re-Evaluation     Row Name 04/21/23 1515             Program Oxygen Prescription   Program Oxygen Prescription Continuous       Liters per minute 2         Home Oxygen   Home Oxygen Device E-Tanks;Home Concentrator       Sleep Oxygen Prescription Continuous        Liters per minute 2       Home Exercise Oxygen Prescription Continuous       Liters per minute 2       Home Resting Oxygen Prescription Continuous       Liters per minute 2       Compliance with Home Oxygen Use Yes         Goals/Expected Outcomes   Short Term Goals To learn and exhibit compliance with exercise, home and travel O2 prescription;To learn and understand importance of maintaining oxygen saturations>88%;To learn and demonstrate proper use of respiratory medications;To learn and understand importance of monitoring SPO2 with pulse oximeter and demonstrate accurate use of the pulse oximeter.;To learn and demonstrate proper pursed lip breathing techniques or other breathing techniques.        Long  Term Goals Exhibits compliance with exercise, home  and travel O2 prescription;Verbalizes importance of monitoring SPO2 with pulse oximeter and return demonstration;Exhibits proper breathing techniques, such as pursed lip breathing or other method taught during program session;Maintenance of O2 saturations>88%;Compliance with respiratory medication;Demonstrates proper use of MDI's       Goals/Expected Outcomes Compliance and understanding of oxygen saturations monitoring and breathing techniques to decrease shortness of breath.                Oxygen Discharge (Final Oxygen Re-Evaluation):  Oxygen Re-Evaluation - 04/21/23 1515       Program Oxygen Prescription   Program Oxygen Prescription Continuous    Liters per minute 2      Home Oxygen   Home Oxygen Device E-Tanks;Home Concentrator    Sleep Oxygen Prescription Continuous    Liters per minute 2    Home Exercise Oxygen Prescription Continuous    Liters per minute 2    Home Resting Oxygen Prescription Continuous    Liters per minute 2    Compliance with Home Oxygen Use Yes  Goals/Expected Outcomes   Short Term Goals To learn and exhibit compliance with exercise, home and travel O2 prescription;To learn and understand  importance of maintaining oxygen saturations>88%;To learn and demonstrate proper use of respiratory medications;To learn and understand importance of monitoring SPO2 with pulse oximeter and demonstrate accurate use of the pulse oximeter.;To learn and demonstrate proper pursed lip breathing techniques or other breathing techniques.     Long  Term Goals Exhibits compliance with exercise, home  and travel O2 prescription;Verbalizes importance of monitoring SPO2 with pulse oximeter and return demonstration;Exhibits proper breathing techniques, such as pursed lip breathing or other method taught during program session;Maintenance of O2 saturations>88%;Compliance with respiratory medication;Demonstrates proper use of MDI's    Goals/Expected Outcomes Compliance and understanding of oxygen saturations monitoring and breathing techniques to decrease shortness of breath.             Initial Exercise Prescription:  Initial Exercise Prescription - 04/05/23 1200       Date of Initial Exercise RX and Referring Provider   Date 04/05/23    Referring Provider Shirlee Latch    Expected Discharge Date 05/18/23      Oxygen   Oxygen Continuous    Liters 2    Maintain Oxygen Saturation 88% or higher      NuStep   Level 1    SPM 50    Minutes 30    METs 1      Prescription Details   Frequency (times per week) 2    Duration Progress to 30 minutes of continuous aerobic without signs/symptoms of physical distress      Intensity   THRR 40-80% of Max Heartrate 55-110    Ratings of Perceived Exertion 11-13    Perceived Dyspnea 0-4      Progression   Progression Continue progressive overload as per policy without signs/symptoms or physical distress.      Resistance Training   Training Prescription Yes    Weight red bands    Reps 10-15             Perform Capillary Blood Glucose checks as needed.  Exercise Prescription Changes:   Exercise Prescription Changes     Row Name 04/13/23 1500 04/27/23  1500           Response to Exercise   Blood Pressure (Admit) 136/70 172/70      Blood Pressure (Exercise) 140/84 170/80      Blood Pressure (Exit) 110/54 142/82      Heart Rate (Admit) 78 bpm 72 bpm      Heart Rate (Exercise) 76 bpm 85 bpm      Heart Rate (Exit) 63 bpm 75 bpm      Oxygen Saturation (Admit) 95 % 100 %      Oxygen Saturation (Exercise) 95 % 95 %      Oxygen Saturation (Exit) 98 % 92 %      Rating of Perceived Exertion (Exercise) 10 10      Perceived Dyspnea (Exercise) 0 3      Duration Progress to 30 minutes of  aerobic without signs/symptoms of physical distress Progress to 30 minutes of  aerobic without signs/symptoms of physical distress      Intensity THRR unchanged THRR unchanged        Progression   Progression Continue to progress workloads to maintain intensity without signs/symptoms of physical distress. Continue to progress workloads to maintain intensity without signs/symptoms of physical distress.        Resistance Training  Training Prescription Yes Yes      Weight red bands red bands      Reps 10-15 10-15      Time 10 Minutes 10 Minutes        Oxygen   Oxygen Continuous Continuous      Liters 2 2        NuStep   Level 1 1      SPM 50 --      Minutes 30 30      METs 1.4 1.6        Home Exercise Plan   Plans to continue exercise at -- Home (comment)  walking      Frequency -- Add 1 additional day to program exercise sessions.      Initial Home Exercises Provided -- 04/27/23        Oxygen   Maintain Oxygen Saturation 88% or higher 88% or higher               Exercise Comments:   Exercise Comments     Row Name 04/13/23 1538 04/27/23 1542         Exercise Comments Pt completed first day of exercise. Zaina exercised for 30 min on the Nustep. She averaged 1.4 METs at level 1 on the Nustep. Melrose performed the warmup and cooldown standing with multiple verbal cues. Discussed home exercise plan with pt. She is currently not exercising.  She sts she can try to start walking in house for longer duration. Encouraged pt to start with 5-10 min 1-2 days a week and add time as able.               Exercise Goals and Review:   Exercise Goals     Row Name 04/05/23 1125 04/21/23 1513           Exercise Goals   Increase Physical Activity Yes Yes      Intervention Provide advice, education, support and counseling about physical activity/exercise needs.;Develop an individualized exercise prescription for aerobic and resistive training based on initial evaluation findings, risk stratification, comorbidities and participant's personal goals. Provide advice, education, support and counseling about physical activity/exercise needs.;Develop an individualized exercise prescription for aerobic and resistive training based on initial evaluation findings, risk stratification, comorbidities and participant's personal goals.      Expected Outcomes Short Term: Attend rehab on a regular basis to increase amount of physical activity.;Long Term: Add in home exercise to make exercise part of routine and to increase amount of physical activity.;Long Term: Exercising regularly at least 3-5 days a week. Short Term: Attend rehab on a regular basis to increase amount of physical activity.;Long Term: Add in home exercise to make exercise part of routine and to increase amount of physical activity.;Long Term: Exercising regularly at least 3-5 days a week.      Increase Strength and Stamina Yes Yes      Intervention Provide advice, education, support and counseling about physical activity/exercise needs.;Develop an individualized exercise prescription for aerobic and resistive training based on initial evaluation findings, risk stratification, comorbidities and participant's personal goals. Provide advice, education, support and counseling about physical activity/exercise needs.;Develop an individualized exercise prescription for aerobic and resistive training  based on initial evaluation findings, risk stratification, comorbidities and participant's personal goals.      Expected Outcomes Short Term: Increase workloads from initial exercise prescription for resistance, speed, and METs.;Short Term: Perform resistance training exercises routinely during rehab and add in resistance training at home;Long Term: Improve cardiorespiratory fitness,  muscular endurance and strength as measured by increased METs and functional capacity ( ) Short Term: Increase workloads from initial exercise prescription for resistance, speed, and METs.;Short Term: Perform resistance training exercises routinely during rehab and add in resistance training at home;Long Term: Improve cardiorespiratory fitness, muscular endurance and strength as measured by increased METs and functional capacity ( )      Able to understand and use rate of perceived exertion (RPE) scale Yes Yes      Intervention Provide education and explanation on how to use RPE scale Provide education and explanation on how to use RPE scale      Expected Outcomes Short Term: Able to use RPE daily in rehab to express subjective intensity level;Long Term:  Able to use RPE to guide intensity level when exercising independently Short Term: Able to use RPE daily in rehab to express subjective intensity level;Long Term:  Able to use RPE to guide intensity level when exercising independently      Able to understand and use Dyspnea scale Yes Yes      Intervention Provide education and explanation on how to use Dyspnea scale Provide education and explanation on how to use Dyspnea scale      Expected Outcomes Short Term: Able to use Dyspnea scale daily in rehab to express subjective sense of shortness of breath during exertion;Long Term: Able to use Dyspnea scale to guide intensity level when exercising independently Short Term: Able to use Dyspnea scale daily in rehab to express subjective sense of shortness of breath during  exertion;Long Term: Able to use Dyspnea scale to guide intensity level when exercising independently      Knowledge and understanding of Target Heart Rate Range (THRR) Yes Yes      Intervention Provide education and explanation of THRR including how the numbers were predicted and where they are located for reference Provide education and explanation of THRR including how the numbers were predicted and where they are located for reference      Expected Outcomes Short Term: Able to state/look up THRR;Long Term: Able to use THRR to govern intensity when exercising independently;Short Term: Able to use daily as guideline for intensity in rehab Short Term: Able to state/look up THRR;Long Term: Able to use THRR to govern intensity when exercising independently;Short Term: Able to use daily as guideline for intensity in rehab      Understanding of Exercise Prescription Yes Yes      Intervention Provide education, explanation, and written materials on patient's individual exercise prescription Provide education, explanation, and written materials on patient's individual exercise prescription      Expected Outcomes Short Term: Able to explain program exercise prescription;Long Term: Able to explain home exercise prescription to exercise independently Short Term: Able to explain program exercise prescription;Long Term: Able to explain home exercise prescription to exercise independently               Exercise Goals Re-Evaluation :  Exercise Goals Re-Evaluation     Row Name 04/21/23 1513             Exercise Goal Re-Evaluation   Exercise Goals Review Increase Physical Activity;Able to understand and use Dyspnea scale;Understanding of Exercise Prescription;Increase Strength and Stamina;Knowledge and understanding of Target Heart Rate Range (THRR);Able to understand and use rate of perceived exertion (RPE) scale       Comments Pt has completed 3 days of exercise. Sache exercises for 30 min on the Nustep.  She averaged 1.6 METs at level 1 on the Nustep.  She is very slowly progressing. She is easily distracted.  Lovinia performed the warmup and cooldown standing with multiple verbal cues.       Expected Outcomes Through exercise at rehab and home, the patient will decrease shortness of breath with daily activities and feel confident in carrying out an exercise regimen at home.                Discharge Exercise Prescription (Final Exercise Prescription Changes):  Exercise Prescription Changes - 04/27/23 1500       Response to Exercise   Blood Pressure (Admit) 172/70    Blood Pressure (Exercise) 170/80    Blood Pressure (Exit) 142/82    Heart Rate (Admit) 72 bpm    Heart Rate (Exercise) 85 bpm    Heart Rate (Exit) 75 bpm    Oxygen Saturation (Admit) 100 %    Oxygen Saturation (Exercise) 95 %    Oxygen Saturation (Exit) 92 %    Rating of Perceived Exertion (Exercise) 10    Perceived Dyspnea (Exercise) 3    Duration Progress to 30 minutes of  aerobic without signs/symptoms of physical distress    Intensity THRR unchanged      Progression   Progression Continue to progress workloads to maintain intensity without signs/symptoms of physical distress.      Resistance Training   Training Prescription Yes    Weight red bands    Reps 10-15    Time 10 Minutes      Oxygen   Oxygen Continuous    Liters 2      NuStep   Level 1    Minutes 30    METs 1.6      Home Exercise Plan   Plans to continue exercise at Home (comment)   walking   Frequency Add 1 additional day to program exercise sessions.    Initial Home Exercises Provided 04/27/23      Oxygen   Maintain Oxygen Saturation 88% or higher             Nutrition:  Target Goals: Understanding of nutrition guidelines, daily intake of sodium 1500mg , cholesterol 200mg , calories 30% from fat and 7% or less from saturated fats, daily to have 5 or more servings of fruits and vegetables.  Biometrics:  Pre Biometrics - 04/05/23  1303       Pre Biometrics   Grip Strength 18 kg              Nutrition Therapy Plan and Nutrition Goals:  Nutrition Therapy & Goals - 04/13/23 1441       Nutrition Therapy   Diet Heart Healthy Diet    Drug/Food Interactions Statins/Certain Fruits      Personal Nutrition Goals   Nutrition Goal Patient to improve diet quality by using the plate method as a guide for meal planning to include lean protein/plant protein, fruits, vegetables, whole grains, nonfat dairy as part of a well-balanced diet.    Comments Pharrah has PMH of HTN, CHF, DM2, CKD3. Valine reports eating 2-3x/day and enjoys a variety of foods especially fruit. Her son and daughter often provide groceries and meals. She reports living alone but her son does stay with her some. Jeanifer will benefit from participation in pulmonary rehab for nutrition and exercise support.      Intervention Plan   Intervention Prescribe, educate and counsel regarding individualized specific dietary modifications aiming towards targeted core components such as weight, hypertension, lipid management, diabetes, heart failure and other comorbidities.;Nutrition handout(s) given to  patient.    Expected Outcomes Short Term Goal: Understand basic principles of dietary content, such as calories, fat, sodium, cholesterol and nutrients.;Long Term Goal: Adherence to prescribed nutrition plan.             Nutrition Assessments:  MEDIFICTS Score Key: ?70 Need to make dietary changes  40-70 Heart Healthy Diet ? 40 Therapeutic Level Cholesterol Diet   Picture Your Plate Scores: <40 Unhealthy dietary pattern with much room for improvement. 41-50 Dietary pattern unlikely to meet recommendations for good health and room for improvement. 51-60 More healthful dietary pattern, with some room for improvement.  >60 Healthy dietary pattern, although there may be some specific behaviors that could be improved.    Nutrition Goals Re-Evaluation:  Nutrition  Goals Re-Evaluation     Row Name 04/13/23 1441             Goals   Current Weight 117 lb 15.1 oz (53.5 kg)       Comment lipids WNL, GFR 29, Cr 1.74, A1c 6.3       Expected Outcome Kadasia has PMH of HTN, CHF, DM2, CKD3. Corrina reports eating 2-3x/day and enjoys a variety of foods especially fruit. Her son and daughter often provide groceries and meals. She reports living alone but her son does stay with her some. Alexea will benefit from participation in pulmonary rehab for nutrition and exercise support.                Nutrition Goals Discharge (Final Nutrition Goals Re-Evaluation):  Nutrition Goals Re-Evaluation - 04/13/23 1441       Goals   Current Weight 117 lb 15.1 oz (53.5 kg)    Comment lipids WNL, GFR 29, Cr 1.74, A1c 6.3    Expected Outcome Annastacia has PMH of HTN, CHF, DM2, CKD3. Keryn reports eating 2-3x/day and enjoys a variety of foods especially fruit. Her son and daughter often provide groceries and meals. She reports living alone but her son does stay with her some. Livi will benefit from participation in pulmonary rehab for nutrition and exercise support.             Psychosocial: Target Goals: Acknowledge presence or absence of significant depression and/or stress, maximize coping skills, provide positive support system. Participant is able to verbalize types and ability to use techniques and skills needed for reducing stress and depression.  Initial Review & Psychosocial Screening:  Initial Psych Review & Screening - 04/05/23 1130       Initial Review   Current issues with None Identified      Family Dynamics   Good Support System? Yes    Comments son and family      Barriers   Psychosocial barriers to participate in program There are no identifiable barriers or psychosocial needs.      Screening Interventions   Interventions Encouraged to exercise             Quality of Life Scores:  Scores of 19 and below usually indicate a poorer quality  of life in these areas.  A difference of  2-3 points is a clinically meaningful difference.  A difference of 2-3 points in the total score of the Quality of Life Index has been associated with significant improvement in overall quality of life, self-image, physical symptoms, and general health in studies assessing change in quality of life.  PHQ-9: Review Flowsheet       04/05/2023 04/01/2023  Depression screen PHQ 2/9  Decreased Interest 1 1  Down, Depressed, Hopeless 0 0  PHQ - 2 Score 1 1  Altered sleeping 0 2  Tired, decreased energy 0 2  Change in appetite 0 0  Feeling bad or failure about yourself  0 0  Trouble concentrating 0 0  Moving slowly or fidgety/restless 0 0  Suicidal thoughts 0 0  PHQ-9 Score 1 5  Difficult doing work/chores Somewhat difficult Somewhat difficult   Interpretation of Total Score  Total Score Depression Severity:  1-4 = Minimal depression, 5-9 = Mild depression, 10-14 = Moderate depression, 15-19 = Moderately severe depression, 20-27 = Severe depression   Psychosocial Evaluation and Intervention:  Psychosocial Evaluation - 04/05/23 1131       Psychosocial Evaluation & Interventions   Interventions Stress management education;Relaxation education;Encouraged to exercise with the program and follow exercise prescription    Comments Son accompanied pt to orientation. Denies psychosocial barriers to exercise.    Expected Outcomes For Brunella to participate in rehab free of psychosocial barriers.    Continue Psychosocial Services  No Follow up required             Psychosocial Re-Evaluation:  Psychosocial Re-Evaluation     Row Name 04/16/23 1459             Psychosocial Re-Evaluation   Current issues with None Identified       Comments Barbette denies any psychosocial barriers or concerns at this time.       Expected Outcomes For Bobbiejo to participate in PR free of any psychosocial barriers or concerns.       Interventions Encouraged to attend  Pulmonary Rehabilitation for the exercise       Continue Psychosocial Services  No Follow up required                Psychosocial Discharge (Final Psychosocial Re-Evaluation):  Psychosocial Re-Evaluation - 04/16/23 1459       Psychosocial Re-Evaluation   Current issues with None Identified    Comments Tenna denies any psychosocial barriers or concerns at this time.    Expected Outcomes For Toriann to participate in PR free of any psychosocial barriers or concerns.    Interventions Encouraged to attend Pulmonary Rehabilitation for the exercise    Continue Psychosocial Services  No Follow up required             Education: Education Goals: Education classes will be provided on a weekly basis, covering required topics. Participant will state understanding/return demonstration of topics presented.  Learning Barriers/Preferences:  Learning Barriers/Preferences - 04/05/23 1126       Learning Barriers/Preferences   Learning Barriers None    Learning Preferences None             Education Topics: Introduction to Pulmonary Rehab Group instruction provided by PowerPoint, verbal discussion, and written material to support subject matter. Instructor reviews what Pulmonary Rehab is, the purpose of the program, and how patients are referred.     Know Your Numbers Group instruction that is supported by a PowerPoint presentation. Instructor discusses importance of knowing and understanding resting, exercise, and post-exercise oxygen saturation, heart rate, and blood pressure. Oxygen saturation, heart rate, blood pressure, rating of perceived exertion, and dyspnea are reviewed along with a normal range for these values.    Exercise for the Pulmonary Patient Group instruction that is supported by a PowerPoint presentation. Instructor discusses benefits of exercise, core components of exercise, frequency, duration, and intensity of an exercise routine, importance of utilizing pulse  oximetry during exercise, safety  while exercising, and options of places to exercise outside of rehab.       MET Level  Group instruction provided by PowerPoint, verbal discussion, and written material to support subject matter. Instructor reviews what METs are and how to increase METs.  Flowsheet Row PULMONARY REHAB CHRONIC OBSTRUCTIVE PULMONARY DISEASE from 04/22/2023 in [facility], V[facility], [facility]  Date 04/22/23  Educator EP  Instruction Review Code 1- Verbalizes Understanding       Pulmonary Medications Verbally interactive group education provided by instructor with focus on inhaled medications and proper administration.   Anatomy and Physiology of the Respiratory System Group instruction provided by PowerPoint, verbal discussion, and written material to support subject matter. Instructor reviews respiratory cycle and anatomical components of the respiratory system and their functions. Instructor also reviews differences in obstructive and restrictive respiratory diseases with examples of each.    Oxygen Safety Group instruction provided by PowerPoint, verbal discussion, and written material to support subject matter. There is an overview of "What is Oxygen" and "Why do we need it".  Instructor also reviews how to create a safe environment for oxygen use, the importance of using oxygen as prescribed, and the risks of noncompliance. There is a brief discussion on traveling with oxygen and resources the patient may utilize.   Oxygen Use Group instruction provided by PowerPoint, verbal discussion, and written material to discuss how supplemental oxygen is prescribed and different types of oxygen supply systems. Resources for more information are provided.    Breathing Techniques Group instruction that is supported by demonstration and informational handouts. Instructor discusses the benefits of pursed lip and diaphragmatic breathing and  detailed demonstration on how to perform both.     Risk Factor Reduction Group instruction that is supported by a PowerPoint presentation. Instructor discusses the definition of a risk factor, different risk factors for pulmonary disease, and how the heart and lungs work together.   MD Day A group question and answer session with a medical doctor that allows participants to ask questions that relate to their pulmonary disease state.   Nutrition for the Pulmonary Patient Group instruction provided by PowerPoint slides, verbal discussion, and written materials to support subject matter. The instructor gives an explanation and review of healthy diet recommendations, which includes a discussion on weight management, recommendations for fruit and vegetable consumption, as well as protein, fluid, caffeine, fiber, sodium, sugar, and alcohol. Tips for eating when patients are short of breath are discussed.    Other Education Group or individual verbal, written, or video instructions that support the educational goals of the pulmonary rehab program. Flowsheet Row PULMONARY REHAB CHRONIC OBSTRUCTIVE PULMONARY DISEASE from 04/15/2023 in [facility] for Heart, Vascular, & Lung Health  Date 04/15/23  Educator RN  Instruction Review Code 1- Verbalizes Understanding        Knowledge Questionnaire Score:  Knowledge Questionnaire Score - 04/05/23 1132       Knowledge Questionnaire Score   Pre Score 14/18             Core Components/Risk Factors/Patient Goals at Admission:  Personal Goals and Risk Factors at Admission - 04/05/23 1127       Core Components/Risk Factors/Patient Goals on Admission   Improve shortness of breath with ADL's Yes    Intervention Provide education, individualized exercise plan and daily activity instruction to help decrease symptoms of SOB with activities of daily living.    Expected Outcomes Short Term: Improve cardiorespiratory  fitness to  achieve a reduction of symptoms when performing ADLs;Long Term: Be able to perform more ADLs without symptoms or delay the onset of symptoms    Increase knowledge of respiratory medications and ability to use respiratory devices properly  Yes    Intervention Provide education and demonstration as needed of appropriate use of medications, inhalers, and oxygen therapy.    Expected Outcomes Short Term: Achieves understanding of medications use. Understands that oxygen is a medication prescribed by physician. Demonstrates appropriate use of inhaler and oxygen therapy.;Long Term: Maintain appropriate use of medications, inhalers, and oxygen therapy.    Heart Failure Yes    Intervention Provide a combined exercise and nutrition program that is supplemented with education, support and counseling about heart failure. Directed toward relieving symptoms such as shortness of breath, decreased exercise tolerance, and extremity edema.    Expected Outcomes Improve functional capacity of life;Short term: Attendance in program 2-3 days a week with increased exercise capacity. Reported lower sodium intake. Reported increased fruit and vegetable intake. Reports medication compliance.;Short term: Daily weights obtained and reported for increase. Utilizing diuretic protocols set by physician.;Long term: Adoption of self-care skills and reduction of barriers for early signs and symptoms recognition and intervention leading to self-care maintenance.    Hypertension Yes    Intervention Provide education on lifestyle modifcations including regular physical activity/exercise, weight management, moderate sodium restriction and increased consumption of fresh fruit, vegetables, and low fat dairy, alcohol moderation, and smoking cessation.;Monitor prescription use compliance.    Expected Outcomes Short Term: Continued assessment and intervention until BP is < 140/55mm HG in hypertensive participants. < 130/65mm HG in hypertensive  participants with diabetes, heart failure or chronic kidney disease.;Long Term: Maintenance of blood pressure at goal levels.             Core Components/Risk Factors/Patient Goals Review:   Goals and Risk Factor Review     Row Name 04/16/23 1502             Core Components/Risk Factors/Patient Goals Review   Personal Goals Review Improve shortness of breath with ADL's;Develop more efficient breathing techniques such as purse lipped breathing and diaphragmatic breathing and practicing self-pacing with activity.;Hypertension;Heart Failure       Review Dajane has only attended 2 sessions so far. It is too early to tell if she has met her heart failure goals. Her blood pressure has been elevated both sessions and her doctor is aware. We will continue to monitor her blood pressure. She is able to demonstrate purse lip breathing when she gets short of breath. She is also able to rate her perceived exertion when asked by staff. We will continue to monitor throughout the program.       Expected Outcomes See admission goals                Core Components/Risk Factors/Patient Goals at Discharge (Final Review):   Goals and Risk Factor Review - 04/16/23 1502       Core Components/Risk Factors/Patient Goals Review   Personal Goals Review Improve shortness of breath with ADL's;Develop more efficient breathing techniques such as purse lipped breathing and diaphragmatic breathing and practicing self-pacing with activity.;Hypertension;Heart Failure    Review Hanni has only attended 2 sessions so far. It is too early to tell if she has met her heart failure goals. Her blood pressure has been elevated both sessions and her doctor is aware. We will continue to monitor her blood pressure. She is able to demonstrate purse lip  breathing when she gets short of breath. She is also able to rate her perceived exertion when asked by staff. We will continue to monitor throughout the program.    Expected  Outcomes See admission goals             ITP Comments:   Comments: Pt is making expected progress toward Pulmonary Rehab goals after completing 5 sessions. Recommend continued exercise, life style modification, education, and utilization of breathing techniques to increase stamina and strength, while also decreasing shortness of breath with exertion.  Dr. Mechele Collin is Medical Director for Pulmonary Rehab at [facility].

## 2023-04-29 ENCOUNTER — Encounter (HOSPITAL_COMMUNITY)
Admission: RE | Admit: 2023-04-29 | Discharge: 2023-04-29 | Disposition: A | Discharge status: Home / Self Care | Payer: Medicare HMO | Source: Ambulatory Visit | Attending: Cardiology | Admitting: Cardiology

## 2023-04-29 DIAGNOSIS — J449 Chronic obstructive pulmonary disease, unspecified: Secondary | ICD-10-CM | POA: Diagnosis not present

## 2023-04-29 NOTE — Progress Notes (Signed)
Daily Session Note  Patient Details  Name: Beth [name] MRN: [id number] Date of Birth: Jun 02, 1941 Referring Provider:   Doristine Devoid Pulmonary Rehab Walk Test from 04/05/2023 in [facility] for [facility], Vascular, & [facility]  Referring Provider Shirlee Latch       Encounter Date: 04/29/2023  Check In:  Session Check In - 04/29/23 1505       Check-In   Supervising physician immediately available to respond to emergencies [facility] MD immediately available    Physician(s) Eligha Bridegroom, NP    Location [facility]    Staff Present Samantha Belarus, RD, LDN;Johnny Hale Bogus, MS, Exercise Physiologist;Mary Gerre Scull, RN, BSN;Randi Reeve BS, ACSM-CEP, Exercise Physiologist;Rayne Cowdrey Heloise Ochoa, MS, ACSM-CEP, CCRP, Exercise Physiologist    Virtual Visit No    Medication changes reported     No    Fall or balance concerns reported    No    Tobacco Cessation No Change    Warm-up and Cool-down Performed as group-led instruction    Resistance Training Performed Yes    VAD Patient? No    PAD/SET Patient? No      Pain Assessment   Currently in Pain? No/denies    Multiple Pain Sites No             Capillary Blood Glucose: No results found for this or any previous visit (from the past 24 hour(s)).    Social History   Tobacco Use  Smoking Status Former   Packs/day: 1.50   Years: 40.00   Additional pack years: 0.00   Total pack years: 60.00   Types: Cigarettes   Quit date: 11/16/2004   Years since quitting: 18.4  Smokeless Tobacco Former    Goals Met:  Proper associated with RPD/PD & O2 Sat Independence with exercise equipment Exercise tolerated well No report of concerns or symptoms today Strength training completed today  Goals Unmet:  Not Applicable  Comments: Service time is from 1350 to 1445.    Dr. Mechele Collin is Medical Director for Pulmonary Rehab at [facility].

## 2023-05-04 ENCOUNTER — Encounter (HOSPITAL_COMMUNITY)
Admission: RE | Admit: 2023-05-04 | Discharge: 2023-05-04 | Disposition: A | Discharge status: Home / Self Care | Payer: Medicare HMO | Source: Ambulatory Visit | Attending: Cardiology | Admitting: Cardiology

## 2023-05-04 DIAGNOSIS — J449 Chronic obstructive pulmonary disease, unspecified: Secondary | ICD-10-CM | POA: Diagnosis not present

## 2023-05-04 NOTE — Progress Notes (Signed)
Daily Session Note  Patient Details  Name: Beth [name] MRN: [id number] Date of Birth: Mar 16, 1941 Referring Provider:   Doristine Devoid Pulmonary Rehab Walk Test from 04/05/2023 in [facility] for Heart, Vascular, & Lung Health  Referring Provider Shirlee Latch       Encounter Date: 05/04/2023  Check In:  Session Check In - 05/04/23 1434       Check-In   Supervising physician immediately available to respond to emergencies [facility] MD immediately available    Physician(s) Bernadene Person, NP    Staff Present Essie Hart, RN, BSN;Samantha Belarus, RD, Rexene Agent, MS, ACSM-CEP, Exercise Physiologist;Maria Whitaker, RN, BSN;Randi Idelle Crouch BS, ACSM-CEP, Exercise Physiologist;Tyray Proch Katrinka Blazing, RT    Virtual Visit No    Medication changes reported     No    Fall or balance concerns reported    No    Warm-up and Cool-down Performed on first and last piece of equipment    Resistance Training Performed Yes    VAD Patient? No    PAD/SET Patient? No      Pain Assessment   Currently in Pain? No/denies    Pain Score 0-No pain    Multiple Pain Sites No             Capillary Blood Glucose: No results found for this or any previous visit (from the past 24 hour(s)).    Social History   Tobacco Use  Smoking Status Former   Packs/day: 1.50   Years: 40.00   Additional pack years: 0.00   Total pack years: 60.00   Types: Cigarettes   Quit date: 11/16/2004   Years since quitting: 18.4  Smokeless Tobacco Former    Goals Met:  Proper associated with RPD/PD & O2 Sat Independence with exercise equipment Exercise tolerated well No report of concerns or symptoms today Strength training completed today  Goals Unmet:  Not Applicable  Comments: Service time is from 1321 to 1445.    Dr. Mechele Collin is Medical Director for Pulmonary Rehab at [facility].

## 2023-05-05 ENCOUNTER — Other Ambulatory Visit (HOSPITAL_COMMUNITY): Payer: Self-pay

## 2023-05-05 DIAGNOSIS — J810 Acute pulmonary edema: Secondary | ICD-10-CM

## 2023-05-05 MED ORDER — OPSUMIT 10 MG PO TABS: ORAL_TABLET | ORAL | 10 refills | Status: DC

## 2023-05-06 ENCOUNTER — Encounter (HOSPITAL_COMMUNITY)
Admission: RE | Admit: 2023-05-06 | Discharge: 2023-05-06 | Disposition: A | Discharge status: Home / Self Care | Payer: Medicare HMO | Source: Ambulatory Visit | Attending: Cardiology | Admitting: Cardiology

## 2023-05-06 DIAGNOSIS — J449 Chronic obstructive pulmonary disease, unspecified: Secondary | ICD-10-CM | POA: Diagnosis not present

## 2023-05-06 NOTE — Progress Notes (Signed)
Daily Session Note  Patient Details  Name: Beth [name] MRN: [id number] Date of Birth: 12/15/1940 Referring Provider:   Doristine Devoid Pulmonary Rehab Walk Test from 04/05/2023 in [facility] for Heart, Vascular, & Lung Health  Referring Provider Shirlee Latch       Encounter Date: 05/06/2023  Check In:  Session Check In - 05/06/23 1415       Check-In   Supervising physician immediately available to respond to emergencies [facility] MD immediately available    Physician(s) Edd Fabian, PA    Staff Present Essie Hart, RN, BSN;Samantha Belarus, RD, Rexene Agent, MS, ACSM-CEP, Exercise Physiologist;Randi Dionisio Paschal, ACSM-CEP, Exercise Physiologist;Casey Katrinka Blazing, RT    Virtual Visit No    Medication changes reported     No    Fall or balance concerns reported    No    Warm-up and Cool-down Performed on first and last piece of equipment    Resistance Training Performed Yes    VAD Patient? No    PAD/SET Patient? No      Pain Assessment   Currently in Pain? No/denies    Pain Score 0-No pain    Multiple Pain Sites No             Capillary Blood Glucose: No results found for this or any previous visit (from the past 24 hour(s)).    Social History   Tobacco Use  Smoking Status Former   Packs/day: 1.50   Years: 40.00   Additional pack years: 0.00   Total pack years: 60.00   Types: Cigarettes   Quit date: 11/16/2004   Years since quitting: 18.4  Smokeless Tobacco Former    Goals Met:  Improved SOB with ADL's Exercise tolerated well No report of concerns or symptoms today Strength training completed today  Goals Unmet:  Not Applicable  Comments: Service time is from 1307 to 1436    Dr. Mechele Collin is Medical Director for Pulmonary Rehab at [facility].

## 2023-05-07 MED ORDER — OPSUMIT 10 MG PO TABS: ORAL_TABLET | ORAL | 3 refills | Status: DC

## 2023-05-07 NOTE — Addendum Note (Signed)
Addended by: Theresia Bough on: 05/07/2023 04:13 PM   Modules accepted: Orders

## 2023-05-07 NOTE — Telephone Encounter (Signed)
Opsumit updated to mail order pharmacy per pharmacy request

## 2023-05-11 ENCOUNTER — Other Ambulatory Visit (HOSPITAL_COMMUNITY): Payer: Self-pay | Admitting: Cardiology

## 2023-05-11 ENCOUNTER — Encounter (HOSPITAL_COMMUNITY)
Admission: RE | Admit: 2023-05-11 | Discharge: 2023-05-11 | Disposition: A | Discharge status: Home / Self Care | Payer: Medicare HMO | Source: Ambulatory Visit | Attending: Cardiology | Admitting: Cardiology

## 2023-05-11 ENCOUNTER — Telehealth (HOSPITAL_COMMUNITY): Payer: Self-pay | Admitting: Pharmacy Technician

## 2023-05-11 VITALS — Wt 116.4 lb

## 2023-05-11 DIAGNOSIS — J449 Chronic obstructive pulmonary disease, unspecified: Secondary | ICD-10-CM | POA: Diagnosis not present

## 2023-05-11 NOTE — Telephone Encounter (Signed)
Opened in error

## 2023-05-11 NOTE — Telephone Encounter (Signed)
Advanced Heart Failure Patient Advocate Encounter  Patient called back and was able to schedule shipment.  Archer Asa, CPhT

## 2023-05-11 NOTE — Progress Notes (Signed)
Daily Session Note  Patient Details  Name: Beth [name] MRN: [id number] Date of Birth: 1941-04-15 Referring Provider:   Doristine Devoid Pulmonary Rehab Walk Test from 04/05/2023 in [facility] for Heart, Vascular, & Lung Health  Referring Provider Shirlee Latch       Encounter Date: 05/11/2023  Check In:  Session Check In - 05/11/23 1430       Check-In   Supervising physician immediately available to respond to emergencies [facility] MD immediately available    Physician(s) Edd Fabian, PA    Staff Present Essie Hart, RN, Doris Cheadle, MS, ACSM-CEP, Exercise Physiologist;Joshau Code Dionisio Paschal, ACSM-CEP, Exercise Physiologist;Casey Robina Ade, RN, MSN    Virtual Visit No    Medication changes reported     No    Fall or balance concerns reported    No    Warm-up and Cool-down Performed on first and last piece of equipment    Resistance Training Performed Yes    VAD Patient? No    PAD/SET Patient? No      Pain Assessment   Currently in Pain? No/denies    Pain Score 0-No pain    Multiple Pain Sites No             Capillary Blood Glucose: No results found for this or any previous visit (from the past 24 hour(s)).   Exercise Prescription Changes - 05/11/23 1500       Response to Exercise   Blood Pressure (Admit) 130/66    Blood Pressure (Exercise) 170/70    Blood Pressure (Exit) 128/72    Heart Rate (Admit) 63 bpm    Heart Rate (Exercise) 79 bpm    Heart Rate (Exit) 67 bpm    Oxygen Saturation (Admit) 94 %    Oxygen Saturation (Exercise) 93 %    Oxygen Saturation (Exit) 91 %    Rating of Perceived Exertion (Exercise) 15    Perceived Dyspnea (Exercise) 3    Duration Progress to 30 minutes of  aerobic without signs/symptoms of physical distress    Intensity THRR unchanged      Progression   Progression Continue to progress workloads to maintain intensity without signs/symptoms of physical distress.      Resistance Training   Training  Prescription Yes    Weight red bands    Reps 10-15    Time 10 Minutes      Oxygen   Oxygen Continuous    Liters 2      NuStep   Level 2    SPM 60    Minutes 15    METs 1.5      Track   Laps 6    Minutes 15    METs 1.92      Oxygen   Maintain Oxygen Saturation 88% or higher             Social History   Tobacco Use  Smoking Status Former   Packs/day: 1.50   Years: 40.00   Additional pack years: 0.00   Total pack years: 60.00   Types: Cigarettes   Quit date: 11/16/2004   Years since quitting: 18.4  Smokeless Tobacco Former    Goals Met:  Exercise tolerated well No report of concerns or symptoms today Strength training completed today  Goals Unmet:  Not Applicable  Comments: Service time is from 1310 to 1445.    Dr. Mechele Collin is Medical Director for Pulmonary Rehab at [facility].

## 2023-05-11 NOTE — Telephone Encounter (Signed)
Advanced Heart Failure Patient Advocate Encounter  Received message stating patient is not able to get refill of Opsumit. Looks like [facility] had not uploaded the RX that was sent on 06/21. They are able to process the refill now, the patient just needs to call back [phone]). Called and left the patient a message.   Archer Asa, CPhT

## 2023-05-13 ENCOUNTER — Encounter (HOSPITAL_COMMUNITY): Payer: Medicare HMO

## 2023-05-14 NOTE — Telephone Encounter (Signed)
Advanced Heart Failure Patient Advocate Encounter  Received shipment notification from [facility]. Medication was shipped 05/12/23.  Archer Asa, CPhT

## 2023-05-18 ENCOUNTER — Encounter (HOSPITAL_COMMUNITY)
Admission: RE | Admit: 2023-05-18 | Discharge: 2023-05-18 | Disposition: A | Discharge status: Home / Self Care | Payer: Medicare HMO | Source: Ambulatory Visit | Attending: Cardiology | Admitting: Cardiology

## 2023-05-18 ENCOUNTER — Telehealth (HOSPITAL_COMMUNITY): Payer: Self-pay | Admitting: *Deleted

## 2023-05-18 DIAGNOSIS — J449 Chronic obstructive pulmonary disease, unspecified: Secondary | ICD-10-CM | POA: Diagnosis present

## 2023-05-18 NOTE — Telephone Encounter (Signed)
I have not seen in 2 y needs re-eval by earliest available provider in [place]

## 2023-05-18 NOTE — Progress Notes (Signed)
Daily Session Note  Patient Details  Name: Beth [name] MRN: [id number] Date of Birth: 06-Sep-1941 Referring Provider:   Doristine Devoid Pulmonary Rehab Walk Test from 04/05/2023 in [facility]  Referring Provider Shirlee Latch       Encounter Date: 05/18/2023  Check In:  Session Check In - 05/18/23 1452       Check-In   Supervising physician immediately available to respond to emergencies [facility] MD immediately available    Physician(s) Edd Fabian, PA    Location [facility]    Staff Present Essie Hart, RN, Doris Cheadle, MS, ACSM-CEP, Exercise Physiologist;Jahon Bart Dionisio Paschal, ACSM-CEP, Exercise Physiologist;Olinty Peggye Pitt, MS, ACSM-CEP, Exercise Physiologist;Samantha Belarus, RD, LDN    Virtual Visit No    Medication changes reported     No    Fall or balance concerns reported    No    Warm-up and Cool-down Performed as group-led instruction    Resistance Training Performed Yes    VAD Patient? No    PAD/SET Patient? No      Pain Assessment   Currently in Pain? No/denies    Pain Score 0-No pain    Multiple Pain Sites No             Capillary Blood Glucose: No results found for this or any previous visit (from the past 24 hour(s)).    Social History   Tobacco Use  Smoking Status Former   Packs/day: 1.50   Years: 40.00   Additional pack years: 0.00   Total pack years: 60.00   Types: Cigarettes   Quit date: 11/16/2004   Years since quitting: 18.5  Smokeless Tobacco Former    Goals Met:  Exercise tolerated well No report of concerns or symptoms today Strength training completed today  Goals Unmet:  Not Applicable  Comments: Pt completed 6 min walk test and graduated. She needed an increase of atleast 4L for exercise. Service time is from 1317 to 1500.    Dr. Mechele Collin is Medical Director for Pulmonary Rehab at [facility].

## 2023-05-18 NOTE — Telephone Encounter (Signed)
Pt performed 6 min walk test on her last day of Pulm Rehab today. Her Sp02 dropped after 2 min to 83 on 2L. She rested standing but could not increase SpO2 to higher than 87 on 4L therefore had pt sit and rest. SpO2 up with rest. This is the first time pt has had low saturations and needing more than 2L during her time. Encouraged her to discuss her increased O2 needs with but wanted to make you aware. Thank you.  Ethelda Chick BS, ACSM-CEP 05/18/2023 4:28 PM

## 2023-05-21 NOTE — Telephone Encounter (Signed)
I called and spoke with the pt and scheduled appt with TP for 06/30/23

## 2023-05-26 NOTE — Progress Notes (Signed)
Discharge Progress Report  Patient Details  Name: Beth [name] MRN: [id number] Date of Birth: 1940-12-06 Referring Provider:   Doristine Devoid Pulmonary Rehab Walk Test from 04/05/2023 in [facility]  Referring Provider [facility]        Number of Visits: 10  Reason for Discharge:  Patient reached a stable level of exercise. Patient independent in their exercise. Patient has met program and personal goals.  Smoking History:  Social History   Tobacco Use  Smoking Status Former   Packs/day: 1.50   Years: 40.00   Additional pack years: 0.00   Total pack years: 60.00   Types: Cigarettes   Quit date: 11/16/2004   Years since quitting: 18.5  Smokeless Tobacco Former    Diagnosis:  Stage 2 moderate COPD by GOLD classification (HCC)  ADL [facility]SD:  Pulmonary Assessment Scores     Row Name 04/05/23 1135 05/06/23 1534 05/18/23 1636     ADL UCSD   ADL Phase Entry Exit Exit   SOB Score total 76 37 --     CAT Score   CAT Score 26 20 --     mMRC Score   mMRC Score 4 -- 4            Initial Exercise Prescription:  Initial Exercise Prescription - 04/05/23 1200       Date of Initial Exercise RX and Referring Provider   Date 04/05/23    Referring Provider Shirlee Latch    Expected Discharge Date 05/18/23      Oxygen   Oxygen Continuous    Liters 2    Maintain Oxygen Saturation 88% or higher      NuStep   Level 1    SPM 50    Minutes 30    METs 1      Prescription Details   Frequency (times per week) 2    Duration Progress to 30 minutes of continuous aerobic without signs/symptoms of physical distress      Intensity   THRR 40-80% of Max Heartrate 55-110    Ratings of Perceived Exertion 11-13    Perceived Dyspnea 0-4      Progression   Progression Continue progressive overload as per policy without signs/symptoms or physical distress.      Resistance Training   Training Prescription Yes    Weight red  bands    Reps 10-15             Discharge Exercise Prescription (Final Exercise Prescription Changes):  Exercise Prescription Changes - 05/11/23 1500       Response to Exercise   Blood Pressure (Admit) 130/66    Blood Pressure (Exercise) 170/70    Blood Pressure (Exit) 128/72    Heart Rate (Admit) 63 bpm    Heart Rate (Exercise) 79 bpm    Heart Rate (Exit) 67 bpm    Oxygen Saturation (Admit) 94 %    Oxygen Saturation (Exercise) 93 %    Oxygen Saturation (Exit) 91 %    Rating of Perceived Exertion (Exercise) 15    Perceived Dyspnea (Exercise) 3    Duration Progress to 30 minutes of  aerobic without signs/symptoms of physical distress    Intensity THRR unchanged      Progression   Progression Continue to progress workloads to maintain intensity without signs/symptoms of physical distress.      Resistance Training   Training Prescription Yes    Weight red bands    Reps 10-15  Time 10 Minutes      Oxygen   Oxygen Continuous    Liters 2      NuStep   Level 2    SPM 60    Minutes 15    METs 1.5      Track   Laps 6    Minutes 15    METs 1.92      Oxygen   Maintain Oxygen Saturation 88% or higher             Functional Capacity:  6 Minute Walk     Row Name 04/05/23 1250 05/18/23 1628       6 Minute Walk   Phase Initial Discharge    Distance 575 feet 480 feet    Distance % Change -- -16.5 %    Distance Feet Change -- -95 ft    Walk Time 6 minutes 6 minutes    # of Rest Breaks 0 1  2:05-5:30 to increase SpO2    MPH 1.11 0.91    METS 1.11 0.92    RPE 17 13    Perceived Dyspnea  2 1    VO2 Peak 3.89 3.23    Symptoms Yes (comment) Yes (comment)    Comments Dyspnea SOB, could not increase SpO2 with increase O2    Resting HR 67 bpm 66 bpm    Resting BP 160/68 142/82    Resting Oxygen Saturation  97 % 95 %    Exercise Oxygen Saturation  during 6 min walk 90 % 83 %    Max Ex. HR 80 bpm 72 bpm    Max Ex. BP 180/80 164/60    2 Minute Post BP  140/70 146/60      Interval HR   1 Minute HR 73 72    2 Minute HR 72 77    3 Minute HR 76 72    4 Minute HR 78 72    5 Minute HR 80 60    6 Minute HR 79 70    2 Minute Post HR 56 60    Interval Heart Rate? Yes --      Interval Oxygen   Interval Oxygen? Yes --    Baseline Oxygen Saturation % 97 % 95 %    1 Minute Oxygen Saturation % 98 % 93 %    1 Minute Liters of Oxygen 2 L 2 L    2 Minute Oxygen Saturation % 93 % 85 %  lowest 83 on 3L. Increased to 4L but had pt sit eventually to increase SpO2    2 Minute Liters of Oxygen 2 L 2 L    3 Minute Oxygen Saturation % 93 % 85 %    3 Minute Liters of Oxygen 2 L 4 L    4 Minute Oxygen Saturation % 91 % 86 %    4 Minute Liters of Oxygen 2 L 4 L    5 Minute Oxygen Saturation % 92 % 89 %    5 Minute Liters of Oxygen 2 L 4 L    6 Minute Oxygen Saturation % 90 % 91 %    6 Minute Liters of Oxygen 2 L 4 L    2 Minute Post Oxygen Saturation % 95 % 93 %    2 Minute Post Liters of Oxygen 2 L 4 L             Psychological, QOL, Others - Outcomes: PHQ 2/9:    05/06/2023    3:32  PM 04/05/2023   11:29 AM 04/01/2023    3:38 PM  Depression screen PHQ 2/9  Decreased Interest 0 1 1  Down, Depressed, Hopeless 0 0 0  PHQ - 2 Score 0 1 1  Altered sleeping 0 0 2  Tired, decreased energy 0 0 2  Change in appetite 0 0 0  Feeling bad or failure about yourself  0 0 0  Trouble concentrating 0 0 0  Moving slowly or fidgety/restless 0 0 0  Suicidal thoughts 0 0 0  PHQ-9 Score 0 1 5  Difficult doing work/chores Not difficult at all Somewhat difficult Somewhat difficult    Quality of Life:   Personal Goals: Goals established at orientation with interventions provided to work toward goal.  Personal Goals and Risk Factors at Admission - 04/05/23 1127       Core Components/Risk Factors/Patient Goals on Admission   Improve shortness of breath with ADL's Yes    Intervention Provide education, individualized exercise plan and daily activity  instruction to help decrease symptoms of SOB with activities of daily living.    Expected Outcomes Short Term: Improve cardiorespiratory fitness to achieve a reduction of symptoms when performing ADLs;Long Term: Be able to perform more ADLs without symptoms or delay the onset of symptoms    Increase knowledge of respiratory medications and ability to use respiratory devices properly  Yes    Intervention Provide education and demonstration as needed of appropriate use of medications, inhalers, and oxygen therapy.    Expected Outcomes Short Term: Achieves understanding of medications use. Understands that oxygen is a medication prescribed by physician. Demonstrates appropriate use of inhaler and oxygen therapy.;Long Term: Maintain appropriate use of medications, inhalers, and oxygen therapy.    Heart Failure Yes    Intervention Provide a combined exercise and nutrition program that is supplemented with education, support and counseling about heart failure. Directed toward relieving symptoms such as shortness of breath, decreased exercise tolerance, and extremity edema.    Expected Outcomes Improve functional capacity of life;Short term: Attendance in program 2-3 days a week with increased exercise capacity. Reported lower sodium intake. Reported increased fruit and vegetable intake. Reports medication compliance.;Short term: Daily weights obtained and reported for increase. Utilizing diuretic protocols set by physician.;Long term: Adoption of self-care skills and reduction of barriers for early signs and symptoms recognition and intervention leading to self-care maintenance.    Hypertension Yes    Intervention Provide education on lifestyle modifcations including regular physical activity/exercise, weight management, moderate sodium restriction and increased consumption of fresh fruit, vegetables, and low fat dairy, alcohol moderation, and smoking cessation.;Monitor prescription use compliance.    Expected  Outcomes Short Term: Continued assessment and intervention until BP is < 140/13mm HG in hypertensive participants. < 130/106mm HG in hypertensive participants with diabetes, heart failure or chronic kidney disease.;Long Term: Maintenance of blood pressure at goal levels.              Personal Goals Discharge:  Goals and Risk Factor Review     Row Name 04/16/23 1502             Core Components/Risk Factors/Patient Goals Review   Personal Goals Review Improve shortness of breath with ADL's;Develop more efficient breathing techniques such as purse lipped breathing and diaphragmatic breathing and practicing self-pacing with activity.;Hypertension;Heart Failure       Review Jayle has only attended 2 sessions so far. It is too early to tell if she has met her heart failure goals. Her blood pressure  has been elevated both sessions and her doctor is aware. We will continue to monitor her blood pressure. She is able to demonstrate purse lip breathing when she gets short of breath. She is also able to rate her perceived exertion when asked by staff. We will continue to monitor throughout the program.       Expected Outcomes See admission goals                Exercise Goals and Review:  Exercise Goals     Row Name 04/05/23 1125 04/21/23 1513           Exercise Goals   Increase Physical Activity Yes Yes      Intervention Provide advice, education, support and counseling about physical activity/exercise needs.;Develop an individualized exercise prescription for aerobic and resistive training based on initial evaluation findings, risk stratification, comorbidities and participant's personal goals. Provide advice, education, support and counseling about physical activity/exercise needs.;Develop an individualized exercise prescription for aerobic and resistive training based on initial evaluation findings, risk stratification, comorbidities and participant's personal goals.      Expected  Outcomes Short Term: Attend rehab on a regular basis to increase amount of physical activity.;Long Term: Add in home exercise to make exercise part of routine and to increase amount of physical activity.;Long Term: Exercising regularly at least 3-5 days a week. Short Term: Attend rehab on a regular basis to increase amount of physical activity.;Long Term: Add in home exercise to make exercise part of routine and to increase amount of physical activity.;Long Term: Exercising regularly at least 3-5 days a week.      Increase Strength and Stamina Yes Yes      Intervention Provide advice, education, support and counseling about physical activity/exercise needs.;Develop an individualized exercise prescription for aerobic and resistive training based on initial evaluation findings, risk stratification, comorbidities and participant's personal goals. Provide advice, education, support and counseling about physical activity/exercise needs.;Develop an individualized exercise prescription for aerobic and resistive training based on initial evaluation findings, risk stratification, comorbidities and participant's personal goals.      Expected Outcomes Short Term: Increase workloads from initial exercise prescription for resistance, speed, and METs.;Short Term: Perform resistance training exercises routinely during rehab and add in resistance training at home;Long Term: Improve cardiorespiratory fitness, muscular endurance and strength as measured by increased METs and functional capacity ( ) Short Term: Increase workloads from initial exercise prescription for resistance, speed, and METs.;Short Term: Perform resistance training exercises routinely during rehab and add in resistance training at home;Long Term: Improve cardiorespiratory fitness, muscular endurance and strength as measured by increased METs and functional capacity ( )      Able to understand and use rate of perceived exertion (RPE) scale Yes Yes       Intervention Provide education and explanation on how to use RPE scale Provide education and explanation on how to use RPE scale      Expected Outcomes Short Term: Able to use RPE daily in rehab to express subjective intensity level;Long Term:  Able to use RPE to guide intensity level when exercising independently Short Term: Able to use RPE daily in rehab to express subjective intensity level;Long Term:  Able to use RPE to guide intensity level when exercising independently      Able to understand and use Dyspnea scale Yes Yes      Intervention Provide education and explanation on how to use Dyspnea scale Provide education and explanation on how to use Dyspnea scale  Expected Outcomes Short Term: Able to use Dyspnea scale daily in rehab to express subjective sense of shortness of breath during exertion;Long Term: Able to use Dyspnea scale to guide intensity level when exercising independently Short Term: Able to use Dyspnea scale daily in rehab to express subjective sense of shortness of breath during exertion;Long Term: Able to use Dyspnea scale to guide intensity level when exercising independently      Knowledge and understanding of Target Heart Rate Range (THRR) Yes Yes      Intervention Provide education and explanation of THRR including how the numbers were predicted and where they are located for reference Provide education and explanation of THRR including how the numbers were predicted and where they are located for reference      Expected Outcomes Short Term: Able to state/look up THRR;Long Term: Able to use THRR to govern intensity when exercising independently;Short Term: Able to use daily as guideline for intensity in rehab Short Term: Able to state/look up THRR;Long Term: Able to use THRR to govern intensity when exercising independently;Short Term: Able to use daily as guideline for intensity in rehab      Understanding of Exercise Prescription Yes Yes      Intervention Provide  education, explanation, and written materials on patient's individual exercise prescription Provide education, explanation, and written materials on patient's individual exercise prescription      Expected Outcomes Short Term: Able to explain program exercise prescription;Long Term: Able to explain home exercise prescription to exercise independently Short Term: Able to explain program exercise prescription;Long Term: Able to explain home exercise prescription to exercise independently               Exercise Goals Re-Evaluation:  Exercise Goals Re-Evaluation     Row Name 04/21/23 1513             Exercise Goal Re-Evaluation   Exercise Goals Review Increase Physical Activity;Able to understand and use Dyspnea scale;Understanding of Exercise Prescription;Increase Strength and Stamina;Knowledge and understanding of Target Heart Rate Range (THRR);Able to understand and use rate of perceived exertion (RPE) scale       Comments Pt has completed 3 days of exercise. Mitzie exercises for 30 min on the Nustep. She averaged 1.6 METs at level 1 on the Nustep. She is very slowly progressing. She is easily distracted.  Sianne performed the warmup and cooldown standing with multiple verbal cues.       Expected Outcomes Through exercise at rehab and home, the patient will decrease shortness of breath with daily activities and feel confident in carrying out an exercise regimen at home.                Nutrition & Weight - Outcomes:  Pre Biometrics - 04/05/23 1303       Pre Biometrics   Grip Strength 18 kg             Post Biometrics - 05/18/23 1636        Post  Biometrics   Grip Strength 19 kg             Nutrition:  Nutrition Therapy & Goals - 05/13/23 1419       Nutrition Therapy   Diet Heart Healthy Diet    Drug/Food Interactions Statins/Certain Fruits      Personal Nutrition Goals   Nutrition Goal Patient to improve diet quality by using the plate method as a guide for  meal planning to include lean protein/plant protein, fruits, vegetables, whole grains, nonfat  dairy as part of a well-balanced diet.    Personal Goal #2 Patient to identify strategies for weight maintenance/weight gain of 0.5-2.0# per week.    Comments Goals in action. Amalia has PMH of HTN, CHF, DM2, CKD3. Lurlie reports eating 2-3x/day and enjoys a variety of foods especially fruit. Her son and daughter often provide groceries and meals. She reports living alone but her son does stay with her some. Recommended introducing Boost Plus 1-2x per day (360kcals, 14g protein) to aid with weight gain/weight maintenance; she continues to drink 2 Boost daily. She has maintained her weight since starting with our program. Kierah will benefit from participation in pulmonary rehab for nutrition and exercise support.      Intervention Plan   Intervention Prescribe, educate and counsel regarding individualized specific dietary modifications aiming towards targeted core components such as weight, hypertension, lipid management, diabetes, heart failure and other comorbidities.;Nutrition handout(s) given to patient.    Expected Outcomes Short Term Goal: Understand basic principles of dietary content, such as calories, fat, sodium, cholesterol and nutrients.;Long Term Goal: Adherence to prescribed nutrition plan.             Nutrition Discharge:  Nutrition Assessments - 05/10/23 1014       Rate Your Plate Scores   Pre Score 54             Education Questionnaire Score:  Knowledge Questionnaire Score - 05/06/23 1535       Knowledge Questionnaire Score   Post Score 16/18             Goals reviewed with patient; copy given to patient.

## 2023-06-20 ENCOUNTER — Other Ambulatory Visit (HOSPITAL_COMMUNITY): Payer: Self-pay | Admitting: Cardiology

## 2023-06-30 ENCOUNTER — Ambulatory Visit: Payer: Medicare HMO | Admitting: Adult Health

## 2023-07-30 ENCOUNTER — Encounter: Payer: Self-pay | Admitting: Adult Health

## 2023-07-30 ENCOUNTER — Ambulatory Visit (INDEPENDENT_AMBULATORY_CARE_PROVIDER_SITE_OTHER): Payer: Medicare HMO

## 2023-07-30 ENCOUNTER — Ambulatory Visit (INDEPENDENT_AMBULATORY_CARE_PROVIDER_SITE_OTHER): Payer: Medicare HMO | Admitting: Adult Health

## 2023-07-30 VITALS — BP 160/80 | HR 60 | Ht 62.0 in | Wt 112.8 lb

## 2023-07-30 DIAGNOSIS — I5032 Chronic diastolic (congestive) heart failure: Secondary | ICD-10-CM

## 2023-07-30 DIAGNOSIS — J449 Chronic obstructive pulmonary disease, unspecified: Secondary | ICD-10-CM | POA: Diagnosis not present

## 2023-07-30 DIAGNOSIS — I272 Pulmonary hypertension, unspecified: Secondary | ICD-10-CM

## 2023-07-30 DIAGNOSIS — J9612 Chronic respiratory failure with hypercapnia: Secondary | ICD-10-CM

## 2023-07-30 DIAGNOSIS — Z23 Encounter for immunization: Secondary | ICD-10-CM

## 2023-07-30 DIAGNOSIS — J9611 Chronic respiratory failure with hypoxia: Secondary | ICD-10-CM | POA: Diagnosis not present

## 2023-07-30 MED ORDER — ALBUTEROL SULFATE HFA 108 (90 BASE) MCG/ACT IN AERS: 1.0000 | INHALATION_SPRAY | Freq: Four times a day (QID) | RESPIRATORY_TRACT | 3 refills | Status: DC | PRN

## 2023-07-30 MED ORDER — TRELEGY ELLIPTA 100-62.5-25 MCG/ACT IN AEPB: 1.0000 | INHALATION_SPRAY | Freq: Every day | RESPIRATORY_TRACT | 11 refills | Status: DC

## 2023-07-30 NOTE — Progress Notes (Signed)
@Patient  ID: Beth [name], female    DOB: 30-Jan-1941, 82 y.o.   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) , chronic diastolic heart failure  followed by cardiology/CHF team, OSA CPAP intolerant   TEST/EVENTS :  PFT's 06/30/2012 FEV1  0.93 (49%) ratio 49 and DLCO 47 corrects to 87   - 07/16/2017   > trial of trelegy  - Spirometry 07/16/2017  FEV1 0.57 (38%)  Ratio 54 with atypical f/v with no rx prior  - 01/02/2019  After extensive coaching inhaler device,  effectiveness =    90% with elipta vs 75% baseline   2D echo Mar 18, 2023 EF at 70 to 75%, grade 1 diastolic dysfunction, RV size normal  2D echo February 25, 2022 EF preserved, grade 1 diastolic dysfunction, elevated pulmonary artery systolic pressure 44.7 mmHg.  07/30/2023 Follow up : COPD, O2 RF  Patient presents for a follow-up visit.  Patient was last seen November 2022.  Patient is followed for COPD.  She says overall her breathing is doing about the same.  She gets short of breath with heavy activities.  She is sedentary.  Is able do some light cooking but gets winded easily.  She has recently finished pulmonary rehab which she feels did help her.  She feels like her strength is a little bit better.  She denies any flare of cough or wheezing.  She remains on Trelegy inhaler daily.  She is on oxygen 2 L.  Denies any increased oxygen demands.  Would like to get her flu shot today.  Says she rarely ever uses her albuterol.  She does live at home independently.  Has to use a wheelchair if she walks for any prolonged amount of time. She follows with CHF team for  pulmonary hypertension with suspected mixed group 3 with COPD on group 1 pulmonary hypertension.  She remains on Opsumit and  sildenafil.  She is on Lasix 80 mg daily and bisoprolol.  Allergies  Allergen Reactions   Aldomet [Methyldopa] Hives   Tyvaso [Treprostinil] Diarrhea    Immunization History  Administered Date(s) Administered   Fluad Trivalent(High Dose 65+) 07/30/2023   Influenza Split 08/17/2011   Influenza Whole 11/17/2012   Influenza, High Dose Seasonal PF 08/16/2016, 08/25/2017, 09/17/2019   PFIZER Comirnaty(Gray Top)Covid-19 Tri-Sucrose Vaccine 03/28/2021   PFIZER(Purple Top)SARS-COV-2 Vaccination 04/20/2020, 05/11/2020   Pneumococcal Polysaccharide-23 08/17/2011   Tdap 03/07/2020    Past Medical History:  Diagnosis Date   Arthritis    Asthma    CHF (congestive heart failure) (HCC)    CKD (chronic kidney disease)    COPD (chronic obstructive pulmonary disease) (HCC)    dR. wERT   Diabetes mellitus (HCC)    type 2   Gout    Hyperlipidemia    Hypertension    Shortness of breath    WITH EXERTION    Sleep apnea    USES  CPAP    Stroke (HCC)     Tobacco History: Social History   Tobacco Use  Smoking Status Former   Current packs/day: 0.00   Average packs/day: 1.5 packs/day for 40.0 years (60.0 ttl pk-yrs)   Types: Cigarettes   Start date: 11/16/1964   Quit date: 11/16/2004   Years since quitting: 18.7  Smokeless Tobacco  Former   Counseling given: Not Answered   Outpatient Medications Prior to Visit  Medication Sig Dispense Refill   allopurinol (ZYLOPRIM) 300 MG tablet Take 300 mg by mouth daily.     Ascorbic Acid (VITAMIN C) 1000 MG tablet Take 1,000 mg by mouth daily.     ASPIRIN LOW DOSE 81 MG tablet TAKE 1 TABLET BY MOUTH EVERY DAY 90 tablet 3   atorvastatin (LIPITOR) 80 MG tablet TAKE 1 TABLET BY MOUTH EVERY DAY 90 tablet 0   b complex vitamins tablet Take 1 tablet by mouth daily.     bisoprolol (ZEBETA) 10 MG tablet Take 1 tablet (10 mg total) by mouth daily. 90 tablet 3   clobetasol cream (TEMOVATE) 0.05 % Apply 1 application topically as needed.      co-enzyme Q-10  30 MG capsule Take 30 mg by mouth daily.     ezetimibe (ZETIA) 10 MG tablet TAKE 1 TABLET BY MOUTH EVERY DAY 90 tablet 0   furosemide (LASIX) 40 MG tablet TAKE 2 TABLETS BY MOUTH EVERY MORNING. CAN TAKE EXTRA DOSE EVERY OTHER AFTERNOON WITH WEIGHT GAIN. 270 tablet 3   KLOR-CON M20 20 MEQ tablet TAKE 3 TABLETS (60 MEQ TOTAL) BY MOUTH DAILY. 270 tablet 3   macitentan (OPSUMIT) 10 MG tablet TAKE 1 TABLET (10MG ) BY MOUTH DAILY. 90 tablet 3   metFORMIN (GLUCOPHAGE) 500 MG tablet Take 500 mg by mouth 2 (two) times daily with a meal.     Omega 3 1000 MG CAPS Take 1 capsule by mouth daily.     omeprazole (PRILOSEC) 10 MG capsule Take 10 mg by mouth daily.     OXYGEN Inhale 2 L into the lungs continuous.     sildenafil (REVATIO) 20 MG tablet TAKE 4 TABLETS (80 MG TOTAL) BY MOUTH 3 (THREE) TIMES DAILY. 1008 tablet 3   albuterol (VENTOLIN HFA) 108 (90 Base) MCG/ACT inhaler INHALE 2 PUFFS INTO THE LUNGS EVERY 4 HOURS AS NEEDED ONLY IF YOU CANT CATCH YOUR BREATH 18 Inhaler 2   Cholecalciferol (VITAMIN D3) 1000 units CAPS Take 1,000 Units by mouth daily.     loratadine (CLARITIN) 10 MG tablet Take 10 mg by mouth as needed.      meloxicam (MOBIC) 15 MG tablet Take 1 tablet by mouth as needed.     TRELEGY ELLIPTA 100-62.5-25 MCG/ACT AEPB INHALE 1 PUFF BY MOUTH EVERY DAY 180 each 4   albuterol (PROVENTIL) (2.5 MG/3ML) 0.083% nebulizer solution Take 2.5 mg by nebulization every 6 (six) hours as needed for wheezing or shortness of breath.     No facility-administered medications prior to visit.     Review of Systems:   Constitutional:   No  weight loss, night sweats,  Fevers, chills,  +fatigue, or  lassitude.  HEENT:   No headaches,  Difficulty swallowing,  Tooth/dental problems, or  Sore throat,                No sneezing, itching, ear ache, nasal congestion, post nasal drip,   CV:  No chest pain,  Orthopnea, PND, swelling in lower extremities, anasarca, dizziness, palpitations, syncope.   GI  No  heartburn, indigestion, abdominal pain, nausea, vomiting, diarrhea, change in bowel habits, loss of appetite, bloody stools.   Resp:  No chest wall deformity  Skin: no rash or lesions.  GU: no dysuria, change in color of urine, no urgency or frequency.  No flank pain, no hematuria   MS:  No joint pain or swelling.  No  decreased range of motion.  No back pain.    Physical Exam  BP (!) 160/80 (BP Location: Left Arm, Cuff Size: Normal)   Pulse 60   Ht 5\' 2"  (1.575 m)   Wt 112 lb 12.8 oz (51.2 kg)   SpO2 91%   BMI 20.63 kg/m   GEN: A/Ox3; pleasant , NAD, elderly, chronically ill-appearing, on oxygen, wheelchair   HEENT:  [place]/AT,  NOSE-clear, THROAT-clear, no lesions, no postnasal drip or exudate noted.   NECK:  Supple w/ fair ROM; no JVD; normal carotid impulses w/o bruits; no thyromegaly or nodules palpated; no lymphadenopathy.    RESP  Clear  P & A; w/o, wheezes/ rales/ or rhonchi. no accessory muscle use, no dullness to percussion  CARD:  RRR, no m/r/g, no peripheral edema, pulses intact, no cyanosis or clubbing.  GI:   Soft & nt; nml bowel sounds; no organomegaly or masses detected.   Musco: Warm bil, no deformities or joint swelling noted.   Neuro: alert, no focal deficits noted.    Skin: Warm, no lesions or rashes    Lab Results:    BNP  No results found.  Administration History     None           No data to display          No results found for: "NITRICOXIDE"      Assessment & Plan:   COPD  GOLD III/ 02 dep  Compensated on triple therapy with Trelegy inhaler. Check chest x-ray today.  Flu shot.  Plan  Patient Instructions  Continue on TRELEGY 1 puff daily. Rinse after use.  Albuterol inhaler As needed   Continue on Oxygen 2l/m .  Chest xray today .  Flu shot today  Follow up with Primary MD /Cardiology for elevated blood pressure.  Follow up with Dr. Sherene Sires  In 6  months and As needed  .     Chronic respiratory failure with  hypoxia and hypercapnia (HCC) Continue on oxygen to maintain O2 saturations greater than 88 to 90%.  Currently at 2 L.  Chronic diastolic CHF (congestive heart failure) (HCC) Appears euvolemic on exam.  Continue follow-up with cardiology  Pulmonary hypertension (HCC) Appears to be stable.  Patient is continue on her current maintenance regimen and follow-up with cardiology     Rubye Oaks, NP 07/30/2023

## 2023-07-30 NOTE — Assessment & Plan Note (Signed)
Continue on oxygen to maintain O2 saturations greater than 88 to 90%.  Currently at 2 L.

## 2023-07-30 NOTE — Assessment & Plan Note (Signed)
Appears euvolemic on exam.  Continue follow-up with cardiology

## 2023-07-30 NOTE — Patient Instructions (Addendum)
Continue on TRELEGY 1 puff daily. Rinse after use.  Albuterol inhaler As needed   Continue on Oxygen 2l/m .  Chest xray today .  Flu shot today  Follow up with Primary MD /Cardiology for elevated blood pressure.  Follow up with Dr. Sherene Sires  In 6  months and As needed  .

## 2023-07-30 NOTE — Assessment & Plan Note (Signed)
Compensated on triple therapy with Trelegy inhaler. Check chest x-ray today.  Flu shot.  Plan  Patient Instructions  Continue on TRELEGY 1 puff daily. Rinse after use.  Albuterol inhaler As needed   Continue on Oxygen 2l/m .  Chest xray today .  Flu shot today  Follow up with Primary MD /Cardiology for elevated blood pressure.  Follow up with Dr. Sherene Sires  In 6  months and As needed  .

## 2023-07-30 NOTE — Assessment & Plan Note (Signed)
Appears to be stable.  Patient is continue on her current maintenance regimen and follow-up with cardiology

## 2023-08-03 ENCOUNTER — Other Ambulatory Visit (HOSPITAL_COMMUNITY): Payer: Self-pay | Admitting: Cardiology

## 2023-08-04 ENCOUNTER — Ambulatory Visit (INDEPENDENT_AMBULATORY_CARE_PROVIDER_SITE_OTHER): Payer: Medicare HMO | Admitting: Family Medicine

## 2023-08-04 ENCOUNTER — Encounter: Payer: Self-pay | Admitting: Family Medicine

## 2023-08-04 VITALS — BP 160/60 | HR 66 | Temp 98.1°F | Ht 62.0 in | Wt 112.0 lb

## 2023-08-04 DIAGNOSIS — E1122 Type 2 diabetes mellitus with diabetic chronic kidney disease: Secondary | ICD-10-CM

## 2023-08-04 DIAGNOSIS — E782 Mixed hyperlipidemia: Secondary | ICD-10-CM | POA: Diagnosis not present

## 2023-08-04 DIAGNOSIS — J449 Chronic obstructive pulmonary disease, unspecified: Secondary | ICD-10-CM | POA: Diagnosis not present

## 2023-08-04 DIAGNOSIS — Z2821 Immunization not carried out because of patient refusal: Secondary | ICD-10-CM

## 2023-08-04 DIAGNOSIS — N1832 Chronic kidney disease, stage 3b: Secondary | ICD-10-CM

## 2023-08-04 DIAGNOSIS — E118 Type 2 diabetes mellitus with unspecified complications: Secondary | ICD-10-CM

## 2023-08-04 NOTE — Progress Notes (Signed)
I,Beth [name], CMA,acting as a Neurosurgeon for [employer], NP.,have documented all relevant documentation on the behalf of Beth Hose, NP,as directed by  Beth Hose, NP while in the presence of Beth Hose, NP.  Subjective:  Patient ID: Beth [name] , female    DOB: 06-02-1941 , 82 y.o.   MRN: [id number]  Chief Complaint  Patient presents with   Diabetes    HPI  Patient is a 82 year old female who presents today for Diabetes management.She stated that she has been off metformin for awhile because it made her "real sick" . She is here with her daughter Erie Noe . Patient reports compliance with her other meds.  Patient has a diagnosis of COPD, followed by Dr Judie Petit.[facility] Pulmonologist.She reported she was in Pulmonary Rehab a couple weeks back and she feels better now with her breathing. Patient is on Oxygen 2 L [place]       Past Medical History:  Diagnosis Date   Arthritis    Asthma    CHF (congestive heart failure) (HCC)    CKD (chronic kidney disease)    COPD (chronic obstructive pulmonary disease) (HCC)    dR. wERT   Diabetes mellitus (HCC)    type 2   Gout    Hyperlipidemia    Hypertension    Shortness of breath    WITH EXERTION    Sleep apnea    USES  CPAP    Stroke [facility])      Family History  Problem Relation Age of Onset   Heart disease Mother    Heart failure Mother    Heart disease Brother    Heart disease Brother    Heart disease Sister    Heart disease Sister    Heart failure Father      Current Outpatient Medications:    allopurinol (ZYLOPRIM) 300 MG tablet, Take 300 mg by mouth daily., Disp: , Rfl:    Ascorbic Acid (VITAMIN C) 1000 MG tablet, Take 1,000 mg by mouth daily., Disp: , Rfl:    ASPIRIN LOW DOSE 81 MG tablet, TAKE 1 TABLET BY MOUTH EVERY DAY, Disp: 90 tablet, Rfl: 3   atorvastatin (LIPITOR) 80 MG tablet, TAKE 1 TABLET BY MOUTH EVERY DAY, Disp: 90 tablet, Rfl: 0   b complex vitamins tablet, Take 1 tablet by mouth daily., Disp: , Rfl:     bisoprolol (ZEBETA) 10 MG tablet, Take 1 tablet (10 mg total) by mouth daily., Disp: 90 tablet, Rfl: 3   clobetasol cream (TEMOVATE) 0.05 %, Apply 1 application topically as needed. , Disp: , Rfl:    co-enzyme Q-10 30 MG capsule, Take 30 mg by mouth daily., Disp: , Rfl:    dapagliflozin propanediol (FARXIGA) 5 MG TABS tablet, Take 1 tablet (5 mg total) by mouth daily., Disp: 90 tablet, Rfl: 1   ezetimibe (ZETIA) 10 MG tablet, TAKE 1 TABLET BY MOUTH EVERY DAY, Disp: 90 tablet, Rfl: 0   Fluticasone-Umeclidin-Vilant (TRELEGY ELLIPTA) 100-62.5-25 MCG/ACT AEPB, Inhale 1 puff into the lungs daily., Disp: 60 each, Rfl: 11   furosemide (LASIX) 40 MG tablet, TAKE 2 TABLETS BY MOUTH EVERY MORNING. CAN TAKE EXTRA DOSE EVERY OTHER AFTERNOON WITH WEIGHT GAIN., Disp: 270 tablet, Rfl: 3   KLOR-CON M20 20 MEQ tablet, TAKE 3 TABLETS (60 MEQ TOTAL) BY MOUTH DAILY., Disp: 270 tablet, Rfl: 3   macitentan (OPSUMIT) 10 MG tablet, TAKE 1 TABLET (10MG ) BY MOUTH DAILY., Disp: 90 tablet, Rfl: 3   Omega 3 1000 MG CAPS, Take  1 capsule by mouth daily., Disp: , Rfl:    omeprazole (PRILOSEC) 10 MG capsule, Take 10 mg by mouth daily., Disp: , Rfl:    OXYGEN, Inhale 2 L into the lungs continuous., Disp: , Rfl:    sildenafil (REVATIO) 20 MG tablet, TAKE 4 TABLETS (80 MG TOTAL) BY MOUTH 3 (THREE) TIMES DAILY., Disp: 1008 tablet, Rfl: 3   albuterol (VENTOLIN HFA) 108 (90 Base) MCG/ACT inhaler, Inhale 1-2 puffs into the lungs every 6 (six) hours as needed for wheezing or shortness of breath. (Patient not taking: Reported on 08/04/2023), Disp: 1 each, Rfl: 3   metFORMIN (GLUCOPHAGE) 500 MG tablet, Take 500 mg by mouth 2 (two) times daily with a meal. (Patient not taking: Reported on 08/04/2023), Disp: , Rfl:    Allergies  Allergen Reactions   Aldomet [Methyldopa] Hives   Tyvaso [Treprostinil] Diarrhea     Review of Systems  Constitutional: Negative.   HENT: Negative.  Negative for congestion, sinus pressure and sinus pain.    Respiratory:  Positive for shortness of breath.   Cardiovascular:  Negative for chest pain, palpitations and leg swelling.  Endocrine: Negative for polydipsia, polyphagia and polyuria.  Musculoskeletal:  Positive for gait problem.  Skin: Negative.   Neurological:  Negative for seizures, light-headedness and headaches.     Today's Vitals   08/04/23 1548 08/04/23 1633  BP: (!) 160/90 (!) 160/60  Pulse: 66   Temp: 98.1 F (36.7 C)   SpO2: 97%   Weight: 112 lb (50.8 kg)   Height: 5\' 2"  (1.575 m)   PainSc: 0-No pain    Body mass index is 20.49 kg/m.  Wt Readings from Last 3 Encounters:  08/04/23 112 lb (50.8 kg)  07/30/23 112 lb 12.8 oz (51.2 kg)  05/11/23 116 lb 6.5 oz (52.8 kg)    The ASCVD Risk score (Arnett DK, et al., 2019) failed to calculate for the following reasons:   The 2019 ASCVD risk score is only valid for ages 5 to 43   The patient has a prior MI or stroke diagnosis  Objective:  Physical Exam HENT:     Head: Normocephalic.  Cardiovascular:     Rate and Rhythm: Normal rate and regular rhythm.  Pulmonary:     Effort: Pulmonary effort is normal.     Breath sounds: Normal breath sounds.     Comments: On oxygen 2LNC Abdominal:     General: Bowel sounds are normal.  Skin:    General: Skin is warm and dry.  Neurological:     Mental Status: She is alert and oriented to person, place, and time.         Assessment And Plan:  Diabetes mellitus type 2 with complications (HCC) Assessment & Plan: Chronic.diet controlled A1c <6.5  Orders: -     CBC -     Basic metabolic panel -     Hemoglobin A1c  COPD mixed type [facility]) Assessment & Plan: Completed Pulmonary rehab some wks ago. O2 dep on 2LNC   CKD stage 3b, GFR 30-44 ml/min (HCC) Assessment & Plan: Encouraged to keep BP well controlled and avoid use of NSAIDs .   Mixed hyperlipidemia Assessment & Plan: Low fat diet advised.   Pneumococcal vaccination declined  COVID-19 vaccination  declined  Herpes zoster vaccination declined  Other orders -     Dapagliflozin Propanediol; Take 1 tablet (5 mg total) by mouth daily.  Dispense: 90 tablet; Refill: 1    Return in 1 month (on 09/03/2023) for  AWV, 2 wk NV pneu and bpc.  Patient was given opportunity to ask questions. Patient verbalized understanding of the plan and was able to repeat key elements of the plan. All questions were answered to their satisfaction.   I, Beth Hose, NP, have reviewed all documentation for this visit. The documentation on 08/10/23 for the exam, diagnosis, procedures, and orders are all accurate and complete.     IF YOU HAVE BEEN REFERRED TO A SPECIALIST, IT MAY TAKE 1-2 WEEKS TO SCHEDULE/PROCESS THE REFERRAL. IF YOU HAVE NOT HEARD FROM US/SPECIALIST IN TWO WEEKS, PLEASE GIVE [place] A CALL AT [phone].

## 2023-08-05 LAB — CBC
Hematocrit: 39.7 % (ref 34.0–46.6)
Hemoglobin: 12.2 g/dL (ref 11.1–15.9)
MCH: 30 pg (ref 26.6–33.0)
MCHC: 30.7 g/dL — ABNORMAL LOW (ref 31.5–35.7)
MCV: 98 fL — ABNORMAL HIGH (ref 79–97)
Platelets: 242 x10E3/uL (ref 150–450)
RBC: 4.07 x10E6/uL (ref 3.77–5.28)
RDW: 14.1 % (ref 11.7–15.4)
WBC: 5.8 x10E3/uL (ref 3.4–10.8)

## 2023-08-05 LAB — BASIC METABOLIC PANEL WITH GFR
BUN/Creatinine Ratio: 29 — ABNORMAL HIGH (ref 12–28)
BUN: 37 mg/dL — ABNORMAL HIGH (ref 8–27)
CO2: 28 mmol/L (ref 20–29)
Calcium: 9.7 mg/dL (ref 8.7–10.3)
Chloride: 99 mmol/L (ref 96–106)
Creatinine, Ser: 1.29 mg/dL — ABNORMAL HIGH (ref 0.57–1.00)
Glucose: 133 mg/dL — ABNORMAL HIGH (ref 70–99)
Potassium: 5.5 mmol/L — ABNORMAL HIGH (ref 3.5–5.2)
Sodium: 142 mmol/L (ref 134–144)
eGFR: 41 mL/min/1.73 — ABNORMAL LOW

## 2023-08-05 LAB — HEMOGLOBIN A1C
Est. average glucose Bld gHb Est-mCnc: 131 mg/dL
Hgb A1c MFr Bld: 6.2 % — ABNORMAL HIGH (ref 4.8–5.6)

## 2023-08-13 DIAGNOSIS — N1832 Chronic kidney disease, stage 3b: Secondary | ICD-10-CM | POA: Insufficient documentation

## 2023-08-13 DIAGNOSIS — N184 Chronic kidney disease, stage 4 (severe): Secondary | ICD-10-CM | POA: Insufficient documentation

## 2023-08-13 MED ORDER — DAPAGLIFLOZIN PROPANEDIOL 5 MG PO TABS: 5.0000 mg | ORAL_TABLET | Freq: Every day | ORAL | 1 refills | Status: DC

## 2023-08-13 NOTE — Assessment & Plan Note (Signed)
Chronic.diet controlled A1c <6.5. Will start [place]

## 2023-08-13 NOTE — Assessment & Plan Note (Signed)
Low fat diet advised

## 2023-08-13 NOTE — Assessment & Plan Note (Signed)
Encouraged to keep BP well controlled and avoid use of NSAIDs

## 2023-08-13 NOTE — Assessment & Plan Note (Signed)
Completed Pulmonary rehab some wks ago. O2 dep on [facility]

## 2023-08-17 ENCOUNTER — Ambulatory Visit (INDEPENDENT_AMBULATORY_CARE_PROVIDER_SITE_OTHER): Payer: Medicare HMO

## 2023-08-17 VITALS — BP 150/62 | HR 56 | Temp 98.1°F | Ht 62.0 in | Wt 112.0 lb

## 2023-08-17 DIAGNOSIS — Z23 Encounter for immunization: Secondary | ICD-10-CM | POA: Diagnosis not present

## 2023-08-17 DIAGNOSIS — I5032 Chronic diastolic (congestive) heart failure: Secondary | ICD-10-CM

## 2023-08-17 DIAGNOSIS — I10 Essential (primary) hypertension: Secondary | ICD-10-CM

## 2023-08-17 MED ORDER — AMLODIPINE BESYLATE 5 MG PO TABS
ORAL_TABLET | ORAL | 0 refills | Status: DC
Start: 2023-08-17 — End: 2023-08-31

## 2023-08-17 NOTE — Progress Notes (Signed)
Patient presents today for a bp check. Patient stated she is currently taking bisoprolol 10mg  in the mornings. Patient is unsure of when her next appointment is with the cardiologist. She had an appointment with them in May. I rechecked patient's bp after 10 minutes and it was 150/62 p56. After speaking with the provider she recommended the patient take amlodipine 5mg  and farxiga 5mg  daily. Patient to call us with her readings in one week. YL,RMA    BP Readings from Last 3 Encounters:  08/04/23 (!) 160/60  07/30/23 (!) 160/80  04/05/23 (!) 160/68

## 2023-08-31 ENCOUNTER — Other Ambulatory Visit: Payer: Self-pay | Admitting: Family Medicine

## 2023-08-31 ENCOUNTER — Other Ambulatory Visit (HOSPITAL_COMMUNITY): Payer: Self-pay | Admitting: Cardiology

## 2023-08-31 DIAGNOSIS — I509 Heart failure, unspecified: Secondary | ICD-10-CM

## 2023-08-31 DIAGNOSIS — I10 Essential (primary) hypertension: Secondary | ICD-10-CM

## 2023-09-08 ENCOUNTER — Ambulatory Visit: Payer: Medicare HMO

## 2023-09-08 DIAGNOSIS — Z0000 Encounter for general adult medical examination without abnormal findings: Secondary | ICD-10-CM | POA: Diagnosis not present

## 2023-09-08 NOTE — Patient Instructions (Signed)
Beth [name] , Thank you for taking time to come for your Medicare Wellness Visit. I appreciate your ongoing commitment to your health goals. Please review the following plan we discussed and let me know if I can assist you in the future.   Referrals/Orders/Follow-Ups/Clinician Recommendations: none  This is a list of the screening recommended for you and due dates:  Health Maintenance  Topic Date Due   Complete foot exam   Never done   Eye exam for diabetics  Never done   COVID-19 Vaccine (4 - 2023-24 season) 07/18/2023   Zoster (Shingles) Vaccine (1 of 2) 11/03/2023*   Hemoglobin A1C  02/01/2024   Yearly kidney health urinalysis for diabetes  03/31/2024   Yearly kidney function blood test for diabetes  08/03/2024   Medicare Annual Wellness Visit  09/07/2024   DTaP/Tdap/Td vaccine (2 - Td or Tdap) 03/07/2030   Pneumonia Vaccine  Completed   Flu Shot  Completed   DEXA scan (bone density measurement)  Completed   HPV Vaccine  Aged Out  *Topic was postponed. The date shown is not the original due date.    Advanced directives: (ACP Link)Information on Advanced Care Planning can be found at [facility] [place] Advance Health Care Directives Advance Health Care Directives ([email or web address]   Next Medicare Annual Wellness Visit scheduled for next year: Yes  Insert Preventive Care attachment Insert FALL PREVENTION attachment if needed

## 2023-09-08 NOTE — Progress Notes (Signed)
Subjective:   Beth [name] is a 82 y.o. female who presents for Medicare Annual (Subsequent) preventive examination.  Visit Complete: Virtual I connected with  Lavanda R Koors on 09/08/23 by a audio enabled telemedicine application and verified that I am speaking with the correct person using two identifiers. Daughter Erie Noe was also on call.  Patient Location: Home  Provider Location: Office/Clinic  I discussed the limitations of evaluation and management by telemedicine. The patient expressed understanding and agreed to proceed.  Vital Signs: Because this visit was a virtual/telehealth visit, some criteria may be missing or patient reported. Any vitals not documented were not able to be obtained and vitals that have been documented are patient reported.    Cardiac Risk Factors include: advanced age (>77men, >48 women);diabetes mellitus;hypertension;dyslipidemia     Objective:    Today's Vitals   There is no height or weight on file to calculate BMI.     09/08/2023    4:12 PM 04/05/2023   11:15 AM 06/22/2017    4:24 PM 03/28/2016    6:50 PM 03/28/2016    6:49 PM 03/27/2016    1:22 PM 11/29/2013    3:21 AM  Advanced Directives  Does Patient Have a Medical Advance Directive? No No No Yes;No No No Patient does not have advance directive  Would patient like information on creating a medical advance directive?  No - Patient declined No - Patient declined No - patient declined information No - patient declined information No - patient declined information   Pre-existing out of facility DNR order (yellow form or pink MOST form)       No    Current Medications (verified) Outpatient Encounter Medications as of 09/08/2023  Medication Sig   albuterol (VENTOLIN HFA) 108 (90 Base) MCG/ACT inhaler Inhale 1-2 puffs into the lungs every 6 (six) hours as needed for wheezing or shortness of breath.   amLODipine (NORVASC) 5 MG tablet TAKE 1 TABLET BY MOUTH EVERYDAY AT BEDTIME   ASPIRIN LOW  DOSE 81 MG tablet TAKE 1 TABLET BY MOUTH EVERY DAY   atorvastatin (LIPITOR) 80 MG tablet TAKE 1 TABLET BY MOUTH EVERY DAY   bisoprolol (ZEBETA) 10 MG tablet Take 1 tablet (10 mg total) by mouth daily.   clobetasol cream (TEMOVATE) 0.05 % Apply 1 application topically as needed.    dapagliflozin propanediol (FARXIGA) 5 MG TABS tablet Take 1 tablet (5 mg total) by mouth daily.   ezetimibe (ZETIA) 10 MG tablet TAKE 1 TABLET BY MOUTH EVERY DAY   Fluticasone-Umeclidin-Vilant (TRELEGY ELLIPTA) 100-62.5-25 MCG/ACT AEPB Inhale 1 puff into the lungs daily.   furosemide (LASIX) 40 MG tablet TAKE 2 TABLETS BY MOUTH EVERY MORNING. CAN TAKE EXTRA DOSE EVERY OTHER AFTERNOON WITH WEIGHT GAIN.   KLOR-CON M20 20 MEQ tablet TAKE 3 TABLETS (60 MEQ TOTAL) BY MOUTH DAILY.   macitentan (OPSUMIT) 10 MG tablet TAKE 1 TABLET (10MG ) BY MOUTH DAILY.   omeprazole (PRILOSEC) 10 MG capsule Take 10 mg by mouth daily.   OXYGEN Inhale 2 L into the lungs continuous.   sildenafil (REVATIO) 20 MG tablet TAKE 4 TABLETS (80 MG TOTAL) BY MOUTH 3 (THREE) TIMES DAILY.   allopurinol (ZYLOPRIM) 300 MG tablet Take 300 mg by mouth daily. (Patient not taking: Reported on 09/08/2023)   Ascorbic Acid (VITAMIN C) 1000 MG tablet Take 1,000 mg by mouth daily. (Patient not taking: Reported on 09/08/2023)   b complex vitamins tablet Take 1 tablet by mouth daily. (Patient not taking: Reported on  09/08/2023)   co-enzyme Q-10 30 MG capsule Take 30 mg by mouth daily. (Patient not taking: Reported on 09/08/2023)   metFORMIN (GLUCOPHAGE) 500 MG tablet Take 500 mg by mouth 2 (two) times daily with a meal. (Patient not taking: Reported on 08/04/2023)   Omega 3 1000 MG CAPS Take 1 capsule by mouth daily. (Patient not taking: Reported on 09/08/2023)   No facility-administered encounter medications on file as of 09/08/2023.    Allergies (verified) Aldomet [methyldopa] and Tyvaso [treprostinil]   History: Past Medical History:  Diagnosis Date    Arthritis    Asthma    CHF (congestive heart failure) (HCC)    CKD (chronic kidney disease)    COPD (chronic obstructive pulmonary disease) (HCC)    dR. wERT   Diabetes mellitus (HCC)    type 2   Gout    Hyperlipidemia    Hypertension    Shortness of breath    WITH EXERTION    Sleep apnea    USES  CPAP    Stroke [facility])    Past Surgical History:  Procedure Laterality Date   CARDIAC CATHETERIZATION N/A 04/02/2016   Procedure: Right Heart Cath;  Surgeon: Laurey Morale, MD;  Location: [facility] INVASIVE CV LAB;  Service: Cardiovascular;  Laterality: N/A;   CATARACT EXTRACTION W/PHACO Right 04/19/2013   Procedure: CATARACT EXTRACTION PHACO AND INTRAOCULAR LENS PLACEMENT (IOC);  Surgeon: Shade Flood, MD;  Location: [facility] OR;  Service: Ophthalmology;  Laterality: Right;   CATARACT EXTRACTION W/PHACO Left 05/03/2013   Procedure: CATARACT EXTRACTION PHACO AND INTRAOCULAR LENS PLACEMENT (IOC);  Surgeon: Shade Flood, MD;  Location: [facility] OR;  Service: Ophthalmology;  Laterality: Left;   EYE SURGERY     TUBAL LIGATION     Family History  Problem Relation Age of Onset   Heart disease Mother    Heart failure Mother    Heart disease Brother    Heart disease Brother    Heart disease Sister    Heart disease Sister    Heart failure Father    Social History   Socioeconomic History   Marital status: Widowed    Spouse name: Not on file   Number of children: 2   Years of education: Not on file   Highest education level: Not on file  Occupational History   Occupation: retired    Associate Professor: OTHER    Comment: [employer]  Tobacco Use   Smoking status: Former    Current packs/day: 0.00    Average packs/day: 1.5 packs/day for 40.0 years (60.0 ttl pk-yrs)    Types: Cigarettes    Start date: 11/16/1964    Quit date: 11/16/2004    Years since quitting: 18.8   Smokeless tobacco: Former  Building services engineer status: Never Used  Substance and Sexual Activity   Alcohol use: No    Comment: occassional    Drug use: No   Sexual activity: Not on file  Other Topics Concern   Not on file  Social History Narrative   Not on file   Social Determinants of Health   Financial Resource Strain: Low Risk  (09/08/2023)   Overall Financial Resource Strain (CARDIA)    Difficulty of Paying Living Expenses: Not hard at all  Food Insecurity: No Food Insecurity (09/08/2023)   Hunger Vital Sign    Worried About Running Out of Food in the Last Year: Never true    Ran Out of Food in the Last Year: Never true  Transportation Needs: No Transportation  Needs (09/08/2023)   PRAPARE - Administrator, Civil Service (Medical): No    Lack of Transportation (Non-Medical): No  Physical Activity: Inactive (09/08/2023)   Exercise Vital Sign    Days of Exercise per Week: 0 days    Minutes of Exercise per Session: 0 min  Stress: No Stress Concern Present (09/08/2023)   [employer] of Occupational Health - Occupational Stress Questionnaire    Feeling of Stress : Not at all  Social Connections: Socially Isolated (09/08/2023)   Social Connection and Isolation Panel [NHANES]    Frequency of Communication with Friends and Family: More than three times a week    Frequency of Social Gatherings with Friends and Family: Three times a week    Attends Religious Services: Never    Active Member of Clubs or Organizations: No    Attends Banker Meetings: Never    Marital Status: Widowed    Tobacco Counseling Counseling given: Not Answered   Clinical Intake:  Pre-visit preparation completed: Yes  Pain : No/denies pain     Nutritional Risks: None Diabetes: Yes CBG done?: No Did pt. bring in CBG monitor from home?: No  How often do you need to have someone help you when you read instructions, pamphlets, or other written materials from your doctor or pharmacy?: 1 - Never  Interpreter Needed?: No  Information entered by :: NAllen LPN   Activities of Daily Living    09/08/2023     4:02 PM  In your present state of health, do you have any difficulty performing the following activities:  Hearing? 1  Comment does not have hearing aids  Vision? 0  Difficulty concentrating or making decisions? 1  Comment sometimes  Walking or climbing stairs? 1  Comment due to COPD  Dressing or bathing? 0  Doing errands, shopping? 0  Preparing Food and eating ? N  Using the Toilet? N  In the past six months, have you accidently leaked urine? Y  Comment ocasionally  Do you have problems with loss of bowel control? N  Managing your Medications? N  Managing your Finances? N  Housekeeping or managing your Housekeeping? N    Patient Care Team: [name], NP as PCP - General (Family Medicine) Laurey Morale, MD as PCP - Advanced Heart Failure (Cardiology)  Indicate any recent Medical Services you may have received from other than [facility] providers in the past year (date may be approximate).     Assessment:   This is a routine wellness examination for Kielee.  Hearing/Vision screen Hearing Screening - Comments:: States has trouble hearing but no hearing aids Vision Screening - Comments:: No regular eye exams   Goals Addressed             This Visit's Progress    Patient Stated       09/08/2023, stay healthy       Depression Screen    09/08/2023    4:14 PM 05/06/2023    3:32 PM 04/05/2023   11:29 AM 04/01/2023    3:38 PM  PHQ 2/9 Scores  PHQ - 2 Score 0 0 1 1  PHQ- 9 Score  0 1 5    Fall Risk    09/08/2023    4:13 PM 05/18/2023    2:52 PM 05/11/2023    2:00 PM 05/06/2023    2:16 PM 05/04/2023    2:34 PM  Fall Risk   Falls in the past year? 0 1 1 1  1  Number falls in past yr: 0 1 1 1 1   Injury with Fall? 0 0 0 0 0  Risk for fall due to : Medication side effect History of fall(s) History of fall(s);Other (Comment) History of fall(s);Impaired balance/gait;Impaired mobility History of fall(s);Impaired balance/gait;Impaired mobility  Risk for fall due to:  Comment  high falls risk HIGH FALLS RISK HIGH FALLS RISK HIGH FALLS RISK  Follow up Falls prevention discussed;Falls evaluation completed Falls evaluation completed Falls evaluation completed Falls evaluation completed Falls evaluation completed    MEDICARE RISK AT HOME: Medicare Risk at Home Any stairs in or around the home?: No If so, are there any without handrails?: No Home free of loose throw rugs in walkways, pet beds, electrical cords, etc?: Yes Adequate lighting in your home to reduce risk of falls?: Yes Life alert?: No Use of a cane, walker or w/c?: No Grab bars in the bathroom?: No Shower chair or bench in shower?: Yes Elevated toilet seat or a handicapped toilet?: No  TIMED UP AND GO:  Was the test performed?  No    Cognitive Function:        09/08/2023    4:14 PM  6CIT Screen  What Year? 0 points  What month? 0 points  What time? 0 points  Count back from 20 0 points  Months in reverse 0 points  Repeat phrase 0 points  Total Score 0 points    Immunizations Immunization History  Administered Date(s) Administered   Fluad Trivalent(High Dose 65+) 07/30/2023   Influenza Split 08/17/2011   Influenza Whole 11/17/2012   Influenza, High Dose Seasonal PF 08/16/2016, 08/25/2017, 09/17/2019   PFIZER Comirnaty(Gray Top)Covid-19 Tri-Sucrose Vaccine 03/28/2021   PFIZER(Purple Top)SARS-COV-2 Vaccination 04/20/2020, 05/11/2020   PNEUMOCOCCAL CONJUGATE-20 08/17/2023   Pneumococcal Polysaccharide-23 08/17/2011   Tdap 03/07/2020    TDAP status: Up to date  Flu Vaccine status: Up to date  Pneumococcal vaccine status: Up to date  Covid-19 vaccine status: Information provided on how to obtain vaccines.   Qualifies for Shingles Vaccine? Yes   Zostavax completed No   Shingrix Completed?: No.    Education has been provided regarding the importance of this vaccine. Patient has been advised to call insurance company to determine out of pocket expense if they have not yet  received this vaccine. Advised may also receive vaccine at local pharmacy or Health Dept. Verbalized acceptance and understanding.  Screening Tests Health Maintenance  Topic Date Due   FOOT EXAM  Never done   OPHTHALMOLOGY EXAM  Never done   COVID-19 Vaccine (4 - 2023-24 season) 07/18/2023   Zoster Vaccines- Shingrix (1 of 2) 11/03/2023 (Originally 01/26/1991)   HEMOGLOBIN A1C  02/01/2024   Diabetic kidney evaluation - Urine ACR  03/31/2024   Diabetic kidney evaluation - eGFR measurement  08/03/2024   Medicare Annual Wellness (AWV)  09/07/2024   DTaP/Tdap/Td (2 - Td or Tdap) 03/07/2030   Pneumonia Vaccine 39+ Years old  Completed   INFLUENZA VACCINE  Completed   DEXA SCAN  Completed   HPV VACCINES  Aged Out    Health Maintenance  Health Maintenance Due  Topic Date Due   FOOT EXAM  Never done   OPHTHALMOLOGY EXAM  Never done   COVID-19 Vaccine (4 - 2023-24 season) 07/18/2023    Colorectal cancer screening: No longer required.   Mammogram status: No longer required due to age.  Bone Density status: Completed 06/20/2018.   Lung Cancer Screening: (Low Dose CT Chest recommended if Age 68-80 years, 20 pack-year  currently smoking OR have quit w/in 15years.) does not qualify.   Lung Cancer Screening Referral: no  Additional Screening:  Hepatitis C Screening: does not qualify;   Vision Screening: Recommended annual ophthalmology exams for early detection of glaucoma and other disorders of the eye. Is the patient up to date with their annual eye exam?  No  Who is the provider or what is the name of the office in which the patient attends annual eye exams? none If pt is not established with a provider, would they like to be referred to a provider to establish care? No .   Dental Screening: Recommended annual dental exams for proper oral hygiene  Diabetic Foot Exam: Diabetic Foot Exam: Overdue, Pt has been advised about the importance in completing this exam. Pt is scheduled for  diabetic foot exam on next appointment.  Community Resource Referral / Chronic Care Management: CRR required this visit?  No   CCM required this visit?  No     Plan:     I have personally reviewed and noted the following in the patient's chart:   Medical and social history Use of alcohol, tobacco or illicit drugs  Current medications and supplements including opioid prescriptions. Patient is not currently taking opioid prescriptions. Functional ability and status Nutritional status Physical activity Advanced directives List of other physicians Hospitalizations, surgeries, and ER visits in previous 12 months Vitals Screenings to include cognitive, depression, and falls Referrals and appointments  In addition, I have reviewed and discussed with patient certain preventive protocols, quality metrics, and best practice recommendations. A written personalized care plan for preventive services as well as general preventive health recommendations were provided to patient.     Barb Merino, LPN   16/08/[id number]   After Visit Summary: (Pick Up) Due to this being a telephonic visit, with patients personalized plan was offered to patient and patient has requested to Pick up at office.  Nurse Notes: none

## 2023-09-09 NOTE — Progress Notes (Signed)
Reviewed   Labs with patient

## 2023-09-09 NOTE — Progress Notes (Signed)
Reviewed labs with Patient.

## 2023-09-17 NOTE — Progress Notes (Signed)
Date:  09/20/2023   ID:  [name], DOB [birth year], MRN [id number]    Provider location: [facility] Type of Visit: Established patient  PCP:  [name], NP  Cardiologist:  Dr. Shirlee Latch   History of Present Illness: Beth [name] is a 82 y.o. female who has a history of COPD on home oxygen, untreated OSA, chronic diastolic CHF/RV failure, and pulmonary hypertension.  She was admitted in 5/17 with acute on chronic diastolic CHF with prominent RV failure and marked shortness of breath.  She was diuresed with IV Lasix.  Echo showed dilated and dysfunctional RV with pulmonary hypertension.  RHC showed marked pulmonary hypertension with systemic PA pressure.  She was started on Revatio in the hospital and this was titrated up.  Subsequently, she was started on macitentan.  She next started Selexipag but she had intractable side effects with selexipag so stopped it.  I then tried her on Tyvaso, but she has developed abdominal discomfort and diarrhea with Tyvaso and has had to stop it (symptomatic even at lowest dose). She did not feel like it helped her breathing when she was on it.  She stopped sildenafil and started riociguat. However, she developed painful hand swelling on riociguat and has had to stop it.  Symptoms resolved off riociguat.     She was admitted in 8/18 with hypotension and AKI.  Valsartan and HCTZ stopped.      Echo in 11/19 showed EF 60-65%, mild AS, normal RV size and systolic function.    Echo in 12/20 showed EF 65-70%, LV mid-cavity gradient to 65 mmHg, mild AS with mean gradient 14 mmHg, normal RV size and systolic function, IVC normal, no TR jet. Echo in 1/22 showed EF 65-70%, mild LVH, mid-cavity LV gradient peak 27 mmHg, normal RV size and systolic function, normal IVC, PASP 18 mmHg, mild AS.   Echo 5/24 showed EF 70-75%, mid-cavity gradient to 32 mmHg with valsalva, normal RV, unable to estimate PA systolic pressure, IVC normal, mild AS mean  gradient 12 mmHg, mild MR.   Today she returns for pulmonary hypertension follow up. Overall feeling fine. She has SOB when she walks further distances or does heavy housework, does OK with ADLs. She is on 2L oxygen continuously. Denies palpitations, CP, dizziness, edema, or PND/Orthopnea. Appetite ok. No fever or chills. Weight at home 112-117 pounds. Taking all medications, takes extra 40 Lasix PRN. She wears 2 L [place] continuously, not able to tolerate CPAP.  She finished Pulmonary Rehab and enjoyed it.  ECG (personally reviewed): NSR 61 bpm   6 minute walk (7/17): 238 m 6 minute walk (10/17): 171 m 6 minute walk (3/18): 231 m 6 minute walk (11/18): 123 m 6 minute walk (1/19): 213 m 6 minute walk (10/19): 231 m 6 minute walk (9/20): 122 m 6 minute walk (11/20): 213 m 6 minute walk (3/21): 182 m 6 minute walk (9/21): 243 m 6 minute walk (4/22): 213 m 6 minute walk (12/22): 213 m 6 minute walk (3/23): 244 m 6 minute walk (5/24): 244 m   Labs (6/20): K 3.6, creatinine 1.17, BNP 21.6 Labs (9/20): K 3.3, creatinine 0.91, BNP 25 Labs (12/20): K 3.6, creatinine 1.06, BNP 21 Labs (3/21): K 3.6, creatinine 1.27, BNP 31 Labs (9/21): K 4, creatinine 1.29 Labs (11/21): LDL 80, HDL 89 Labs (1/22): K 3.6, creatinine 1.66, BNP 78.5 Labs (4/22): BNP 23, K 4.4, creatinine 1.53 Labs (8/22): K 3.6, creatinine 1.26,  BNP 27 Labs (12/22): BNP 124 Labs (1/23): K 4.3, creatinine 1.47 Labs (3/23): K 4.1, creatinine 1.35, LDL 73, BNP 88 Labs (5/24): LDL 86 Labs (9/24): K 5.5, creatinine 1.29   PMH: 1. H/o CVA 2. DM 3. HTN 4. Hyperlipidemia 5. OSA: Cannot tolerate CPAP, has tried multiple masks.  6. COPD: She is on home oxygen. 7. SVT 8. Chronic diastolic CHF with RV failure: Echo (5/17) with EF 55%, severely dilated RV with moderately decreased systolic function, D-shaped interventricular septum.  - Echo (7/18): EF 60-65%, D-shaped interventricular septum, mild RV dilation with normal systolic  function, PASP 41, IVC normal, mild MR, mild AS.  - Echo (11/19): EF 60-65%, mild AS, normal RV size and systolic function.  - Echo (12/20): EF 65-70%, LV mid-cavity gradient to 65 mmHg, mild AS with mean gradient 14 mmHg, normal RV size and systolic function, IVC normal, no TR jet.  - Echo (1/22): EF 65-70%, mild LVH, mid-cavity LV gradient peak 27 mmHg, normal RV size and systolic function, normal IVC, PASP 18 mmHg, mild AS.   - Echo (5/24): EF 70-75%, mid-cavity gradient to 32 mmHg with valsalva, normal RV, unable to estimate PA systolic pressure, IVC normal, mild AS mean gradient 12 mmHg, mild MR.  9. 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  V/Q scan 5/17 with no evidence for chronic PE.  ANA weakly positive (1:80), ANCA negative, RF negative.  RHC (5/17) with mean RA 20, PA 110/45 mean 66, mean PCWP 20, CI 1.7, PVR 15 WU.  - Intractable side effects with selexipag.  - Did not tolerate Tyvaso - Did not tolerate riociguat.  10. CKD 11. Aortic stenosis: Mild.   Current Outpatient Medications  Medication Sig Dispense Refill   albuterol (VENTOLIN HFA) 108 (90 Base) MCG/ACT inhaler Inhale 1-2 puffs into the lungs every 6 (six) hours as needed for wheezing or shortness of breath. 1 each 3   amLODipine (NORVASC) 5 MG tablet TAKE 1 TABLET BY MOUTH EVERYDAY AT BEDTIME 30 tablet 0   Ascorbic Acid (VITAMIN C) 1000 MG tablet Take 1,000 mg by mouth daily.     ASPIRIN LOW DOSE 81 MG tablet TAKE 1 TABLET BY MOUTH EVERY DAY 90 tablet 3   atorvastatin (LIPITOR) 80 MG tablet TAKE 1 TABLET BY MOUTH EVERY DAY 90 tablet 0   b complex vitamins tablet Take 1 tablet by mouth daily.     bisoprolol (ZEBETA) 10 MG tablet Take 1 tablet (10 mg total) by mouth daily. 90 tablet 3   clobetasol cream (TEMOVATE) 0.05 % Apply 1 application topically as needed.      co-enzyme Q-10 30 MG capsule Take 30 mg by mouth daily.      dapagliflozin propanediol (FARXIGA) 5 MG TABS tablet Take 1 tablet (5 mg total) by mouth daily. 90 tablet 1   ezetimibe (ZETIA) 10 MG tablet TAKE 1 TABLET BY MOUTH EVERY DAY 90 tablet 0   Fluticasone-Umeclidin-Vilant (TRELEGY ELLIPTA) 100-62.5-25 MCG/ACT AEPB Inhale 1 puff into the lungs daily. 60 each 11   furosemide (LASIX) 40 MG tablet TAKE 2 TABLETS BY MOUTH EVERY MORNING. CAN TAKE EXTRA DOSE EVERY OTHER AFTERNOON WITH WEIGHT GAIN. 270 tablet 3   KLOR-CON M20 20 MEQ tablet TAKE 3 TABLETS (60 MEQ TOTAL) BY MOUTH DAILY. 270 tablet 3   macitentan (OPSUMIT) 10 MG tablet TAKE 1 TABLET (10MG ) BY MOUTH DAILY. 90 tablet 3   metFORMIN (GLUCOPHAGE)  500 MG tablet Take 500 mg by mouth 2 (two) times daily with a meal.     Omega 3 1000 MG CAPS Take 1 capsule by mouth daily.     omeprazole (PRILOSEC) 10 MG capsule Take 10 mg by mouth daily.     OXYGEN Inhale 2 L into the lungs continuous.     sildenafil (REVATIO) 20 MG tablet TAKE 4 TABLETS (80 MG TOTAL) BY MOUTH 3 (THREE) TIMES DAILY. 1008 tablet 3   No current facility-administered medications for this encounter.    Allergies:   Aldomet [methyldopa] and Tyvaso [treprostinil]   Social History:  The patient  reports that she quit smoking about 18 years ago. Her smoking use included cigarettes. She started smoking about 58 years ago. She has a 60 pack-year smoking history. She has quit using smokeless tobacco. She reports that she does not drink alcohol and does not use drugs.   Family History:  The patient's family history includes Heart disease in her brother, brother, mother, sister, and sister; Heart failure in her father and mother.   ROS:  Please see the history of present illness.   All other systems are personally reviewed and negative.   Recent Labs: 03/18/2023: B Natriuretic Peptide 94.5; TSH 0.900 04/01/2023: ALT 15 08/04/2023: BUN 37; Creatinine, Ser 1.29; Hemoglobin 12.2; Platelets 242; Potassium 5.5; Sodium 142  Personally reviewed   Wt  Readings from Last 3 Encounters:  09/20/23 51.2 kg (112 lb 12.8 oz)  08/17/23 50.8 kg (112 lb)  08/04/23 50.8 kg (112 lb)    BP 130/78   Pulse 66   Wt 51.2 kg (112 lb 12.8 oz)   SpO2 94% Comment: 2L oxygen  BMI 20.63 kg/m   Exam:   General:  NAD. No resp difficulty, arrived in [facility] on oxygen, elderly HEENT: Normal Neck: Supple. No JVD. Carotids 2+ bilat; no bruits. No lymphadenopathy or thryomegaly appreciated. Cor: PMI nondisplaced. Regular rate & rhythm. No rubs, gallops, murmurs, clear S2 Lungs: Diminished Abdomen: Soft, nontender, nondistended. No hepatosplenomegaly. No bruits or masses. Good bowel sounds. Extremities: No cyanosis, clubbing, rash, edema Neuro: Alert & oriented x 3, cranial nerves grossly intact. Moves all 4 extremities w/o difficulty. Affect pleasant.  Assessment/Plan: 1. 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  She was very short of breath with exertion and had very elevated right-sided filling pressures on 5/17 RHC.  PA pressure was systemic. Cardiac output low, but not moving towards IV [place]olan given the mixed etiology of her PH.  V/Q scan not suggestive of chronic PEs and autoimmune serologies sent (RF negative, ANCA negative, ANA only weakly positive (1:80). She has been unable to tolerate selexipag, Tyvaso, or riociguat. Echo in 1/22 showed normal RV size and systolic function and estimation of PA pressure, surprisingly, was not significantly elevated. Echo 5/24 showed normal RV size and systolic function, unable to estimate PA systolic pressure.   NYHA class II-IIb, she is not volume overloaded today. She declines today. - Continue sildenafil 80 mg tid.  - Continue Opsumit 10 mg daily.  - Check BNP.  - She completed Pulmonary Rehab 2. COPD: On home oxygen.  Prior long-time smoker. No change.   3. Chronic diastolic CHF with prominent RV failure: Echo in 1/22  with RV function improved, appears normal.  Echo 5/24 showed EF 70-75%, mid-cavity gradient to 32 mmHg with valsalva, normal RV, unable to estimate PA systolic pressure, IVC normal,  mild AS mean gradient 12 mmHg, mild MR.  NYHA class II-IIb, she is not volume overloaded today.  - Continue Lasix 80 mg daily + 60 KCL daily.  BMET today.  - Continue bisoprolol 10 mg daily (beta-1 selective beta blocker with COPD) given mid-cavity gradient.   - Continue Farxiga 5 mg daily. No GU symptoms. 4. SVT: Denies palpitations.  5. OSA: Has been unable to tolerate CPAP.  She uses home oxygen.  OSA may have small contribution to [facility] but does not explain the extent of PAH.  6. Hyperlipidemia - Continue statin. Recent LDL 86 7. HTN: BP controlled.    - Meds as above. 8. Aortic stenosis: Mild on most recent echo.    Followup in 6 months with Dr. Shirlee Latch  Signed, Jacklynn Ganong, FNP  09/20/2023  [facility] [address] [facility] [place] [address] [phone] (office) [phone] (fax)

## 2023-09-20 ENCOUNTER — Encounter (HOSPITAL_COMMUNITY): Payer: Self-pay

## 2023-09-20 ENCOUNTER — Ambulatory Visit (HOSPITAL_COMMUNITY)
Admission: RE | Admit: 2023-09-20 | Discharge: 2023-09-20 | Disposition: A | Discharge status: Home / Self Care | Payer: Medicare HMO | Source: Ambulatory Visit | Attending: Family Medicine | Admitting: Family Medicine

## 2023-09-20 ENCOUNTER — Telehealth (HOSPITAL_COMMUNITY): Payer: Self-pay

## 2023-09-20 VITALS — BP 130/78 | HR 66 | Wt 112.8 lb

## 2023-09-20 DIAGNOSIS — Z9981 Dependence on supplemental oxygen: Secondary | ICD-10-CM | POA: Insufficient documentation

## 2023-09-20 DIAGNOSIS — I471 Supraventricular tachycardia, unspecified: Secondary | ICD-10-CM | POA: Diagnosis not present

## 2023-09-20 DIAGNOSIS — Z7984 Long term (current) use of oral hypoglycemic drugs: Secondary | ICD-10-CM | POA: Diagnosis not present

## 2023-09-20 DIAGNOSIS — R0602 Shortness of breath: Secondary | ICD-10-CM | POA: Diagnosis present

## 2023-09-20 DIAGNOSIS — I350 Nonrheumatic aortic (valve) stenosis: Secondary | ICD-10-CM | POA: Diagnosis not present

## 2023-09-20 DIAGNOSIS — I5032 Chronic diastolic (congestive) heart failure: Secondary | ICD-10-CM | POA: Diagnosis not present

## 2023-09-20 DIAGNOSIS — I130 Hypertensive heart and chronic kidney disease with heart failure and stage 1 through stage 4 chronic kidney disease, or unspecified chronic kidney disease: Secondary | ICD-10-CM | POA: Diagnosis not present

## 2023-09-20 DIAGNOSIS — I509 Heart failure, unspecified: Secondary | ICD-10-CM

## 2023-09-20 DIAGNOSIS — N189 Chronic kidney disease, unspecified: Secondary | ICD-10-CM | POA: Insufficient documentation

## 2023-09-20 DIAGNOSIS — E785 Hyperlipidemia, unspecified: Secondary | ICD-10-CM | POA: Insufficient documentation

## 2023-09-20 DIAGNOSIS — G4733 Obstructive sleep apnea (adult) (pediatric): Secondary | ICD-10-CM | POA: Insufficient documentation

## 2023-09-20 DIAGNOSIS — Z87891 Personal history of nicotine dependence: Secondary | ICD-10-CM | POA: Diagnosis not present

## 2023-09-20 DIAGNOSIS — Z79899 Other long term (current) drug therapy: Secondary | ICD-10-CM | POA: Diagnosis not present

## 2023-09-20 DIAGNOSIS — J449 Chronic obstructive pulmonary disease, unspecified: Secondary | ICD-10-CM | POA: Diagnosis not present

## 2023-09-20 DIAGNOSIS — E1122 Type 2 diabetes mellitus with diabetic chronic kidney disease: Secondary | ICD-10-CM | POA: Diagnosis not present

## 2023-09-20 DIAGNOSIS — I10 Essential (primary) hypertension: Secondary | ICD-10-CM

## 2023-09-20 DIAGNOSIS — I272 Pulmonary hypertension, unspecified: Secondary | ICD-10-CM | POA: Insufficient documentation

## 2023-09-20 LAB — BRAIN NATRIURETIC PEPTIDE: B Natriuretic Peptide: 80.6 pg/mL (ref 0.0–100.0)

## 2023-09-20 LAB — BASIC METABOLIC PANEL
Anion gap: 13 (ref 5–15)
BUN: 61 mg/dL — ABNORMAL HIGH (ref 8–23)
CO2: 28 mmol/L (ref 22–32)
Calcium: 9.2 mg/dL (ref 8.9–10.3)
Chloride: 98 mmol/L (ref 98–111)
Creatinine, Ser: 1.95 mg/dL — ABNORMAL HIGH (ref 0.44–1.00)
GFR, Estimated: 25 mL/min — ABNORMAL LOW (ref >60)
Glucose, Bld: 125 mg/dL — ABNORMAL HIGH (ref 70–99)
Potassium: 4.7 mmol/L (ref 3.5–5.1)
Sodium: 139 mmol/L (ref 135–145)

## 2023-09-20 MED ORDER — FUROSEMIDE 40 MG PO TABS: 60.0000 mg | ORAL_TABLET | Freq: Every day | ORAL | 2 refills | Status: DC

## 2023-09-20 MED ORDER — POTASSIUM CHLORIDE CRYS ER 20 MEQ PO TBCR: 20.0000 meq | EXTENDED_RELEASE_TABLET | Freq: Every day | ORAL | Status: DC

## 2023-09-20 NOTE — Telephone Encounter (Signed)
-----   Message from Jacklynn Ganong sent at 09/20/2023  2:18 PM EST ----- Kidney function elevated.  Hold Lasix, KCL, and Farxiga x 3 days  After 3 days, resume lower dose of lasix 60 mg daily, lower dose of KCL at 20 daily, and usual home dose of Farxiga 5 mg daily  Repeat BMET in 10-14 days

## 2023-09-20 NOTE — Telephone Encounter (Signed)
Patient advised and verbalized understanding, med list updated to reflect changes. Patient will go to pcp office to have repeat blood work done.   Meds ordered this encounter  Medications   furosemide (LASIX) 40 MG tablet    Sig: Take 1.5 tablets (60 mg total) by mouth daily. Can take extra dose every other afternoon with weight gain.    Dispense:  45 tablet    Refill:  2    Please cancel all previous orders for current medication. Change in dosage or pill size.   potassium chloride SA (KLOR-CON M20) 20 MEQ tablet    Sig: Take 1 tablet (20 mEq total) by mouth daily.    Please cancel all previous orders for current medication. Change in dosage or pill size.   Orders Placed This Encounter  Procedures   Basic metabolic panel    Standing Status:   Future    Standing Expiration Date:   09/19/2024    Order Specific Question:   Release to patient    Answer:   Immediate    Order Specific Question:   Release to patient    Answer:   Immediate [1]

## 2023-09-20 NOTE — Patient Instructions (Signed)
EKG done today.  Labs done today. We will contact you only if your labs are abnormal.  No medication changes were made. Please continue all current medications as prescribed.  Your physician recommends that you schedule a follow-up appointment in: 6 months with Dr. Shirlee Latch. Please contact our office in March 2025 to schedule a May 2025 appointment.   If you have any questions or concerns before your next appointment please send [place] a message through [place] or call our office at [phone].    TO LEAVE A MESSAGE FOR THE NURSE SELECT OPTION 2, PLEASE LEAVE A MESSAGE INCLUDING: YOUR NAME DATE OF BIRTH CALL BACK NUMBER REASON FOR CALL**this is important as we prioritize the call backs  YOU WILL RECEIVE A CALL BACK THE SAME DAY AS LONG AS YOU CALL BEFORE 4:00 PM   Do the following things EVERYDAY: Weigh yourself in the morning before breakfast. Write it down and keep it in a log. Take your medicines as prescribed Eat low salt foods--Limit salt (sodium) to 2000 mg per day.  Stay as active as you can everyday Limit all fluids for the day to less than 2 liters   At the [facility], you and your health needs are our priority. As part of our continuing mission to provide you with exceptional heart care, we have created designated Provider Care Teams. These Care Teams include your primary Cardiologist (physician) and Advanced Practice Providers (APPs- Physician Assistants and Nurse Practitioners) who all work together to provide you with the care you need, when you need it.   You may see any of the following providers on your designated Care Team at your next follow up: Dr Arvilla Meres Dr Marca Ancona Dr. Marcos Eke, NP Robbie Lis, [place] [facility] [place], [place] Brynda Peon, NP Karle Plumber, PharmD   Please be sure to bring in all your medications bottles to every appointment.    Thank you for choosing [facility]

## 2023-10-04 ENCOUNTER — Other Ambulatory Visit (HOSPITAL_COMMUNITY): Payer: Self-pay | Admitting: Cardiology

## 2023-10-04 ENCOUNTER — Other Ambulatory Visit: Payer: Self-pay | Admitting: Family Medicine

## 2023-10-04 DIAGNOSIS — I509 Heart failure, unspecified: Secondary | ICD-10-CM

## 2023-10-04 DIAGNOSIS — I10 Essential (primary) hypertension: Secondary | ICD-10-CM

## 2023-10-21 ENCOUNTER — Telehealth (HOSPITAL_COMMUNITY): Payer: Self-pay | Admitting: Pharmacist

## 2023-10-21 NOTE — Telephone Encounter (Signed)
Patient Advocate Encounter   Attempted to renew PA for patients Opsumit. CMM key [id number]. Received the following message: Authorization already on file for this request. Authorization starting on 08/17/2023 and ending on 11/15/2024.   Karle Plumber, PharmD, BCPS, BCCP, CPP [facility] Pharmacist [phone]

## 2023-10-21 NOTE — Telephone Encounter (Signed)
[facility] Patient Advocate Encounter  Prior Authorization for Sildenafil has been approved.    PA# [id number] Effective dates: 11/16/22 through 11/15/24  Archer Asa, CPhT

## 2023-10-21 NOTE — Telephone Encounter (Signed)
Patient Advocate Encounter   Received notification from [facility] that prior authorization for sildenafil is required.   PA submitted on CoverMyMeds Key [id number] Status is pending   Will continue to follow.  Karle Plumber, PharmD, BCPS, BCCP, CPP [facility] Pharmacist [phone]

## 2023-11-28 ENCOUNTER — Other Ambulatory Visit (HOSPITAL_COMMUNITY): Payer: Self-pay | Admitting: Cardiology

## 2023-11-28 DIAGNOSIS — I509 Heart failure, unspecified: Secondary | ICD-10-CM

## 2024-01-20 ENCOUNTER — Ambulatory Visit: Payer: Medicare HMO | Admitting: Adult Health

## 2024-01-27 ENCOUNTER — Ambulatory Visit (INDEPENDENT_AMBULATORY_CARE_PROVIDER_SITE_OTHER): Admitting: Adult Health

## 2024-01-27 ENCOUNTER — Encounter: Payer: Self-pay | Admitting: Adult Health

## 2024-01-27 VITALS — BP 150/58 | HR 67 | Temp 98.2°F | Wt 112.0 lb

## 2024-01-27 DIAGNOSIS — J9612 Chronic respiratory failure with hypercapnia: Secondary | ICD-10-CM | POA: Diagnosis not present

## 2024-01-27 DIAGNOSIS — J449 Chronic obstructive pulmonary disease, unspecified: Secondary | ICD-10-CM

## 2024-01-27 DIAGNOSIS — Z9981 Dependence on supplemental oxygen: Secondary | ICD-10-CM

## 2024-01-27 DIAGNOSIS — I272 Pulmonary hypertension, unspecified: Secondary | ICD-10-CM | POA: Diagnosis not present

## 2024-01-27 DIAGNOSIS — Z87891 Personal history of nicotine dependence: Secondary | ICD-10-CM

## 2024-01-27 DIAGNOSIS — J9611 Chronic respiratory failure with hypoxia: Secondary | ICD-10-CM

## 2024-01-27 DIAGNOSIS — I110 Hypertensive heart disease with heart failure: Secondary | ICD-10-CM

## 2024-01-27 DIAGNOSIS — I5032 Chronic diastolic (congestive) heart failure: Secondary | ICD-10-CM

## 2024-01-27 DIAGNOSIS — I10 Essential (primary) hypertension: Secondary | ICD-10-CM

## 2024-01-27 MED ORDER — TRELEGY ELLIPTA 100-62.5-25 MCG/ACT IN AEPB: 1.0000 | INHALATION_SPRAY | Freq: Every day | RESPIRATORY_TRACT | Status: DC

## 2024-01-27 MED ORDER — ALBUTEROL SULFATE HFA 108 (90 BASE) MCG/ACT IN AERS: 1.0000 | INHALATION_SPRAY | Freq: Four times a day (QID) | RESPIRATORY_TRACT | 3 refills | Status: DC | PRN

## 2024-01-27 NOTE — Addendum Note (Signed)
 Addended by: Delrae Rend on: 01/27/2024 11:00 AM   Modules accepted: Orders

## 2024-01-27 NOTE — Progress Notes (Signed)
 @Patient  ID: Beth [name], female    DOB: Jul 28, 1941, 83 y.o.   MRN: [id number]  Chief Complaint  Patient presents with   Follow-up   Discussed the use of AI scribe software for clinical note transcription with the patient, who gave verbal consent to proceed.  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    TEST/EVENTS :  PFT's 06/30/2012 FEV1  0.93 (49%) ratio 49 and DLCO 47 corrects to 87   - 07/16/2017   > trial of trelegy  - Spirometry 07/16/2017  FEV1 0.57 (38%)  Ratio 54 with atypical f/v with no rx prior  - 01/02/2019  After extensive coaching inhaler device,  effectiveness =    90% with elipta vs 75% baseline    2D echo Mar 18, 2023 EF at 70 to 75%, grade 1 diastolic dysfunction, RV size normal   2D echo February 25, 2022 EF preserved, grade 1 diastolic dysfunction, elevated pulmonary artery systolic pressure 44.7 mmHg.  01/27/2024 Follow up: COPD, O2 RF , PAH/CHF The patient is an 83 year old with GOLD3 COPD and oxygen-dependent respiratory failure who presents for 6 month follow-up.  Her breathing remains stable, allowing her to perform daily activities such as getting up, getting dressed, and preparing meals. She does not drive and has never driven. Does not exercise on regular basis .   She is currently on two liters of oxygen She uses Trelegy 1 puff daily .  She uses albuterol as a rescue inhaler on an as-needed basis, particularly when she is outside and becomes more winded. No recent antibiotic use.   Her medical history includes pulmonary hypertension, chronic diastolic heart failure, and obstructive sleep apnea. She is CPAP intolerant.  She remains on patient sildenafil, Opsumit and Lasix.   Continues to follow up with cardiology on regular basis. Her leg swelling is well-controlled unless she consumes foods with extra salt, which causes her legs to swell quickly and sometimes hurt. She tries to avoid salty foods but occasionally indulges. Her daughter ensures she eats well with balanced meals.   No recent history of coughing and no current concerns or problems.  No increased oxygen demands.  Overall feels she is doing well.  Chest x-ray last visit September 2024 showed COPD changes with no acute process.            Allergies  Allergen Reactions   Aldomet [Methyldopa] Hives   Tyvaso [Treprostinil] Diarrhea    Immunization History  Administered Date(s) Administered   Fluad Trivalent(High Dose 65+) 07/30/2023   Influenza Split 08/17/2011   Influenza Whole 11/17/2012   Influenza, High Dose Seasonal PF 08/16/2016, 08/25/2017, 09/17/2019   PFIZER Comirnaty(Gray Top)Covid-19 Tri-Sucrose Vaccine 03/28/2021   PFIZER(Purple Top)SARS-COV-2 Vaccination 04/20/2020, 05/11/2020   PNEUMOCOCCAL CONJUGATE-20 08/17/2023   Pneumococcal Polysaccharide-23 08/17/2011   Tdap 03/07/2020    Past Medical History:  Diagnosis Date   Arthritis    Asthma    CHF (congestive heart failure) (HCC)    CKD (chronic kidney disease)    COPD (chronic obstructive pulmonary disease) (HCC)    dR. wERT   Diabetes mellitus (HCC)    type 2   Gout    Hyperlipidemia    Hypertension    Shortness of breath    WITH EXERTION  Sleep apnea    USES  CPAP    Stroke (HCC)     Tobacco History: Social History   Tobacco Use  Smoking Status Former   Current packs/day: 0.00   Average packs/day: 1.5 packs/day for 40.0 years (60.0 ttl pk-yrs)   Types: Cigarettes   Start date: 11/16/1964   Quit date: 11/16/2004   Years since quitting: 19.2  Smokeless Tobacco Former   Counseling given: Not Answered   Outpatient Medications Prior to Visit  Medication Sig Dispense Refill   albuterol (VENTOLIN HFA) 108 (90  Base) MCG/ACT inhaler Inhale 1-2 puffs into the lungs every 6 (six) hours as needed for wheezing or shortness of breath. 1 each 3   amLODipine (NORVASC) 5 MG tablet TAKE 1 TABLET BY MOUTH EVERYDAY AT BEDTIME 90 tablet 1   ASPIRIN LOW DOSE 81 MG tablet TAKE 1 TABLET BY MOUTH EVERY DAY 90 tablet 3   atorvastatin (LIPITOR) 80 MG tablet TAKE 1 TABLET BY MOUTH EVERY DAY 90 tablet 0   b complex vitamins tablet Take 1 tablet by mouth daily.     bisoprolol (ZEBETA) 10 MG tablet Take 1 tablet (10 mg total) by mouth daily. 90 tablet 3   clobetasol cream (TEMOVATE) 0.05 % Apply 1 application topically as needed.      dapagliflozin propanediol (FARXIGA) 5 MG TABS tablet Take 1 tablet (5 mg total) by mouth daily. 90 tablet 1   ezetimibe (ZETIA) 10 MG tablet TAKE 1 TABLET BY MOUTH EVERY DAY 90 tablet 0   Fluticasone-Umeclidin-Vilant (TRELEGY ELLIPTA) 100-62.5-25 MCG/ACT AEPB Inhale 1 puff into the lungs daily. 60 each 11   furosemide (LASIX) 40 MG tablet TAKE 2 TABLETS BY MOUTH EVERY MORNING. CAN TAKE EXTRA DOSE EVERY OTHER AFTERNOON WITH WEIGHT GAIN. 270 tablet 3   macitentan (OPSUMIT) 10 MG tablet TAKE 1 TABLET (10MG ) BY MOUTH DAILY. 90 tablet 3   omeprazole (PRILOSEC) 10 MG capsule Take 10 mg by mouth daily.     OXYGEN Inhale 2 L into the lungs continuous.     potassium chloride SA (KLOR-CON M20) 20 MEQ tablet Take 1 tablet (20 mEq total) by mouth daily.     sildenafil (REVATIO) 20 MG tablet TAKE 4 TABLETS (80 MG TOTAL) BY MOUTH 3 (THREE) TIMES DAILY. 1008 tablet 3   Ascorbic Acid (VITAMIN C) 1000 MG tablet Take 1,000 mg by mouth daily. (Patient not taking: Reported on 01/27/2024)     co-enzyme Q-10 30 MG capsule Take 30 mg by mouth daily. (Patient not taking: Reported on 01/27/2024)     metFORMIN (GLUCOPHAGE) 500 MG tablet Take 500 mg by mouth 2 (two) times daily with a meal. (Patient not taking: Reported on 01/27/2024)     Omega 3 1000 MG CAPS Take 1 capsule by mouth daily. (Patient not taking: Reported on  01/27/2024)     No facility-administered medications prior to visit.     Review of Systems:   Constitutional:   No  weight loss, night sweats,  Fevers, chills, +fatigue, or  lassitude.  HEENT:   No headaches,  Difficulty swallowing,  Tooth/dental problems, or  Sore throat,                No sneezing, itching, ear ache, nasal congestion, post nasal drip,   CV:  No chest pain,  Orthopnea, PND, swelling in lower extremities, anasarca, dizziness, palpitations, syncope.   GI  No heartburn, indigestion, abdominal pain, nausea, vomiting, diarrhea, change in bowel habits, loss of appetite, bloody stools.  Resp:   No chest wall deformity  Skin: no rash or lesions.  GU: no dysuria, change in color of urine, no urgency or frequency.  No flank pain, no hematuria   MS:  No joint pain or swelling.  No decreased range of motion.  No back pain.    Physical Exam  BP (!) 160/60 (BP Location: Left Arm, Patient Position: Sitting, Cuff Size: Large)   Pulse 67   Temp 98.2 F (36.8 C) (Oral)   Wt 112 lb (50.8 kg)   SpO2 99%   BMI 20.49 kg/m   GEN: A/Ox3; pleasant , NAD, frail elderly on O2    HEENT:  [place]/AT,  NOSE-clear, THROAT-clear, no lesions, no postnasal drip or exudate noted.   NECK:  Supple w/ fair ROM; no JVD; normal carotid impulses w/o bruits; no thyromegaly or nodules palpated; no lymphadenopathy.    RESP  Clear  P & A; w/o, wheezes/ rales/ or rhonchi. no accessory muscle use, no dullness to percussion  CARD:  RRR, no m/r/g, no peripheral edema, pulses intact, no cyanosis or clubbing.  GI:   Soft & nt; nml bowel sounds; no organomegaly or masses detected.   Musco: Warm bil, no deformities or joint swelling noted.   Neuro: alert, no focal deficits noted.    Skin: Warm, no lesions or rashes    Lab Results:  CBC    Component Value Date/Time   WBC 5.8 08/04/2023 1632   WBC 5.8 03/18/2023 0934   RBC 4.07 08/04/2023 1632   RBC 3.91 03/18/2023 0934   HGB 12.2 08/04/2023  1632   HCT 39.7 08/04/2023 1632   PLT 242 08/04/2023 1632   MCV 98 (H) 08/04/2023 1632   MCH 30.0 08/04/2023 1632   MCH 31.2 03/18/2023 0934   MCHC 30.7 (L) 08/04/2023 1632   MCHC 31.1 03/18/2023 0934   RDW 14.1 08/04/2023 1632   LYMPHSABS 0.7 06/22/2017 1624   MONOABS 0.5 06/22/2017 1624   EOSABS 0.2 06/22/2017 1624   BASOSABS 0.0 06/22/2017 1624    BMET    Component Value Date/Time   NA 139 09/20/2023 0904   NA 142 08/04/2023 1632   K 4.7 09/20/2023 0904   CL 98 09/20/2023 0904   CO2 28 09/20/2023 0904   GLUCOSE 125 (H) 09/20/2023 0904   BUN 61 (H) 09/20/2023 0904   BUN 37 (H) 08/04/2023 1632   CREATININE 1.95 (H) 09/20/2023 0904   CALCIUM 9.2 09/20/2023 0904   GFRNONAA 25 (L) 09/20/2023 0904   GFRAA 46 (L) 07/25/2020 1007    BNP    Component Value Date/Time   BNP 80.6 09/20/2023 0904    ProBNP    Component Value Date/Time   PROBNP <30.0 10/06/2010 2004    Imaging: No results found.  Administration History     None           No data to display          No results found for: "NITRICOXIDE"      Assessment & Plan:  Assessment and Plan    Chronic Obstructive Pulmonary Disease (COPD) GOLD 3   She has GOLD 3 COPD and is oxygen-dependent. Her condition is well-managed, allowing her to perform daily activities like dressing and meal preparation. She uses 2 liters of oxygen without increased oxygen demands.  Albuterol is used as a rescue inhaler as needed, especially when outside, to manage dyspnea. Continue oxygen therapy at 2 liters and albuterol inhaler as needed. Provide a 90-day supply of medication.  Continue on Trelegy daily.  Very better  Pulmonary Hypertension   She has pulmonary hypertension, mixed group 3/group 1, Continue on current regimen and follow-up with Cardiology   Chronic Diastolic Heart Failure   She has chronic diastolic heart failure and is followed by cardiology and the CHF team. Reports leg swelling with high salt intake,  which she avoids. Currently, her legs show no significant edema. Monitor leg swelling and dietary salt intake. Continue follow-up with cardiology and CHF team.  Obstructive Sleep Apnea   She has obstructive sleep apnea and is CPAP intolerant.   Hypertension-blood pressure is elevated.  Advised to discuss with primary care and cardiology.  Continue current meds.  No significant symptoms.  Follow-up   Maintain her current treatment regimen and contact the clinic if any issues arise. A follow-up appointment in 6 months and As needed       I spent  31  minutes dedicated to the care of this patient on the date of this encounter to include pre-visit review of records, face-to-face time with the patient discussing conditions above, post visit ordering of testing, clinical documentation with the electronic health record, making appropriate referrals as documented, and communicating necessary findings to members of the patients care team.   Rubye Oaks, NP 01/27/2024

## 2024-01-27 NOTE — Addendum Note (Signed)
 Addended by: Delrae Rend on: 01/27/2024 09:50 AM   Modules accepted: Orders

## 2024-01-27 NOTE — Patient Instructions (Addendum)
 Continue on TRELEGY 1 puff daily. Rinse after use.  Albuterol inhaler As needed   Continue on Oxygen 2l/m .  Follow up with Cardiology as planned.  Follow up with Dr. Sherene Sires  In 6  months and As needed  .

## 2024-02-09 ENCOUNTER — Other Ambulatory Visit: Payer: Self-pay | Admitting: Family Medicine

## 2024-02-09 DIAGNOSIS — I10 Essential (primary) hypertension: Secondary | ICD-10-CM

## 2024-03-06 ENCOUNTER — Other Ambulatory Visit: Payer: Self-pay | Admitting: Family Medicine

## 2024-03-30 ENCOUNTER — Other Ambulatory Visit (HOSPITAL_COMMUNITY): Payer: Self-pay | Admitting: Cardiology

## 2024-04-17 ENCOUNTER — Ambulatory Visit: Payer: Self-pay

## 2024-04-17 NOTE — Telephone Encounter (Signed)
   Chief Complaint: confusion Symptoms: irritation  Disposition: [] ED /[] Urgent Care (no appt availability in office) / [x] Appointment(In office/virtual)/ []  [facility]/ [] Home Care/ [] Refused Recommended Disposition /[] [place] [facility]/ []  Follow-up with PCP Additional Notes: Daughter Sherian Dimitri called on behalf of pt over concerns of mental decline. Pt seems to be confused at times which causes agitation and irration. This behavior started last week. Sherian Dimitri is requesting office be aware and to be subtle when question pt so she doesn't get upset.  Sherian Dimitri requested appt on 6/12 due to her schedule. Pt has appt on 6/12 @ 1600.           Copied from CRM [id number]. Topic: Clinical - Red Word Triage >> Apr 17, 2024  3:44 PM Fonda T wrote: Red Word that prompted transfer to Nurse Triage: Patient daughter calling Arlyn Lambing verified), states mom has been having Episodes of confusion, acting strangely. Reason for Disposition  [1] Longstanding confusion (e.g., dementia, stroke) AND [2] NO worsening or change  Answer Assessment - Initial Assessment Questions 1. LEVEL OF CONSCIOUSNESS: "How is he (she, the patient) acting right now?" (e.g., alert-oriented, confused, lethargic, stuporous, comatose)     Confused on dates 2. ONSET: "When did the confusion start?"  (minutes, hours, days)     Last week 3. PATTERN "Does this come and go, or has it been constant since it started?"  "Is it present now?"     Comes and goes  4. ALCOHOL or DRUGS: "Has he been drinking alcohol or taking any drugs?"      Na  5. NARCOTIC MEDICINES: "Has he been receiving any narcotic medications?" (e.g., morphine, Vicodin)     Na  6. CAUSE: "What do you think is causing the confusion?"      Not sure  7. OTHER SYMPTOMS: "Are there any other symptoms?" (e.g., difficulty breathing, headache, fever, weakness)     Irritated with correction, twists words  Protocols used: Confusion -  Delirium-A-AH

## 2024-04-20 ENCOUNTER — Encounter: Payer: Self-pay | Admitting: Family Medicine

## 2024-04-20 ENCOUNTER — Ambulatory Visit: Admitting: Family Medicine

## 2024-04-20 VITALS — BP 140/80 | Temp 98.4°F | Ht 62.0 in | Wt 110.0 lb

## 2024-04-20 DIAGNOSIS — I272 Pulmonary hypertension, unspecified: Secondary | ICD-10-CM | POA: Diagnosis not present

## 2024-04-20 DIAGNOSIS — J9611 Chronic respiratory failure with hypoxia: Secondary | ICD-10-CM

## 2024-04-20 DIAGNOSIS — Z741 Need for assistance with personal care: Secondary | ICD-10-CM

## 2024-04-20 DIAGNOSIS — I5032 Chronic diastolic (congestive) heart failure: Secondary | ICD-10-CM

## 2024-04-20 DIAGNOSIS — J449 Chronic obstructive pulmonary disease, unspecified: Secondary | ICD-10-CM

## 2024-04-20 DIAGNOSIS — R6889 Other general symptoms and signs: Secondary | ICD-10-CM

## 2024-04-20 DIAGNOSIS — J9612 Chronic respiratory failure with hypercapnia: Secondary | ICD-10-CM

## 2024-04-20 DIAGNOSIS — E1122 Type 2 diabetes mellitus with diabetic chronic kidney disease: Secondary | ICD-10-CM | POA: Diagnosis not present

## 2024-04-20 DIAGNOSIS — N1832 Chronic kidney disease, stage 3b: Secondary | ICD-10-CM

## 2024-04-20 DIAGNOSIS — R410 Disorientation, unspecified: Secondary | ICD-10-CM

## 2024-04-20 LAB — POCT URINALYSIS DIPSTICK
Bilirubin, UA: NEGATIVE
Blood, UA: NEGATIVE
Glucose, UA: POSITIVE — AB
Ketones, UA: NEGATIVE
Leukocytes, UA: NEGATIVE
Nitrite, UA: NEGATIVE
Protein, UA: NEGATIVE
Spec Grav, UA: 1.015 (ref 1.010–1.025)
Urobilinogen, UA: 0.2 U/dL
pH, UA: 7.5 (ref 5.0–8.0)

## 2024-04-20 NOTE — Progress Notes (Signed)
 I,Beth [name], CMA,acting as a Neurosurgeon for Beth Lynch, NP.,have documented all relevant documentation on the behalf of Beth Spry, NP,as directed by  Beth Spry, NP while in the presence of Beth Spry, NP.  Subjective:  Patient ID: Beth [name] , female    DOB: 02/07/1941 , 83 y.o.   MRN: [id number]  Chief Complaint  Patient presents with   Joint Swelling   Altered Mental Status    HPI  Patient is a 83 year old female who presents today with her daughter Beth [name]. [name] is concerned about the patient whom she reports has been really disoriented recently, confused, agitated and has increased swelling of her feet swelling.   When they came in,the CMA noticed that patient's oxygen tank was barely on 1 L and her oxygen saturation was on 78%, it was increased to 2L and it stabilized in the 90s. Patient stated that she turned it off when she was getting into the car to came to the clinic, so that she can save it.Patient asked her daughter to leave the room briefly and reported that she needs help with activities of daily living. She brought her bag of medicines and the bottles all seemed full especially the bottles of Furosemide . When asked patient insisted that she was taking all her medicines as prescribed. [name] reports that in the evening she seems to get more confused, will check UTI but advised to always keep oxygen on at 2 L by Beth [name] because low oxygen levels can cause confusion.      Past Medical History:  Diagnosis Date   Arthritis    Asthma    CHF (congestive heart failure) (HCC)    CKD (chronic kidney disease)    COPD (chronic obstructive pulmonary disease) (HCC)    dR. wERT   Diabetes mellitus (HCC)    type 2   Gout    Hyperlipidemia    Hypertension    Shortness of breath    WITH EXERTION    Sleep apnea    USES  CPAP    Stroke (HCC)      Family History  Problem Relation Age of Onset   Heart disease Mother    Heart failure Mother     Heart disease Brother    Heart disease Brother    Heart disease Sister    Heart disease Sister    Heart failure Father      Current Outpatient Medications:    albuterol  (VENTOLIN  HFA) 108 (90 Base) MCG/ACT inhaler, Inhale 1-2 puffs into the lungs every 6 (six) hours as needed for wheezing or shortness of breath., Disp: 1 each, Rfl: 3   ASPIRIN  LOW DOSE 81 MG tablet, TAKE 1 TABLET BY MOUTH EVERY DAY, Disp: 90 tablet, Rfl: 3   bisoprolol  (ZEBETA ) 10 MG tablet, Take 1 tablet (10 mg total) by mouth daily., Disp: 90 tablet, Rfl: 3   clobetasol cream (TEMOVATE) 0.05 %, Apply 1 application topically as needed.  (Patient not taking: Reported on 04/26/2024), Disp: , Rfl:    ezetimibe  (ZETIA ) 10 MG tablet, TAKE 1 TABLET BY MOUTH EVERY DAY, Disp: 90 tablet, Rfl: 0   FARXIGA  5 MG TABS tablet, TAKE 1 TABLET (5 MG TOTAL) BY MOUTH DAILY., Disp: 90 tablet, Rfl: 1   Fluticasone -Umeclidin-Vilant (TRELEGY ELLIPTA ) 100-62.5-25 MCG/ACT AEPB, Inhale 1 puff into the lungs daily., Disp: 60 each, Rfl: 11   furosemide  (LASIX ) 40 MG tablet, TAKE 2 TABLETS BY MOUTH EVERY MORNING. CAN TAKE EXTRA DOSE EVERY OTHER AFTERNOON  WITH WEIGHT GAIN., Disp: 270 tablet, Rfl: 3   OXYGEN, Inhale 2 L into the lungs continuous., Disp: , Rfl:    potassium chloride  SA (KLOR-CON  M20) 20 MEQ tablet, Take 1 tablet (20 mEq total) by mouth daily., Disp: , Rfl:    sildenafil  (REVATIO ) 20 MG tablet, TAKE 4 TABLETS (80 MG TOTAL) BY MOUTH 3 (THREE) TIMES DAILY., Disp: 1008 tablet, Rfl: 3   amLODipine  (NORVASC ) 5 MG tablet, TAKE 1 TABLET BY MOUTH EVERYDAY AT BEDTIME, Disp: 90 tablet, Rfl: 1   atorvastatin  (LIPITOR) 80 MG tablet, TAKE 1 TABLET BY MOUTH EVERY DAY (Patient not taking: Reported on 04/26/2024), Disp: 90 tablet, Rfl: 0   macitentan  (OPSUMIT ) 10 MG tablet, TAKE 1 TABLET (10MG ) BY MOUTH DAILY., Disp: 90 tablet, Rfl: 3   omeprazole (PRILOSEC) 10 MG capsule, Take 10 mg by mouth daily. (Patient not taking: Reported on 04/26/2024), Disp: , Rfl:     Allergies  Allergen Reactions   Aldomet [Methyldopa] Hives   Tyvaso  [Treprostinil ] Diarrhea     Review of Systems  Constitutional:  Positive for activity change and appetite change. Negative for fatigue and fever.  HENT: Negative.    Eyes: Negative.   Respiratory: Negative.    Cardiovascular:  Positive for leg swelling. Negative for chest pain and palpitations.  Gastrointestinal: Negative.   Musculoskeletal: Negative.   Neurological:  Negative for seizures and headaches.  Psychiatric/Behavioral:  Positive for behavioral problems.      Today's Vitals   04/20/24 1610 04/20/24 1646  BP: (!) 160/110 (!) 140/80  Temp: 98.4 F (36.9 C)   TempSrc: Oral   Weight: 110 lb (49.9 kg)   Height: 5' 2 (1.575 m)   PainSc: 0-No pain    Body mass index is 20.12 kg/m.  Wt Readings from Last 3 Encounters:  04/20/24 110 lb (49.9 kg)  01/27/24 112 lb (50.8 kg)  09/20/23 112 lb 12.8 oz (51.2 kg)    The ASCVD Risk score (Arnett DK, et al., 2019) failed to calculate for the following reasons:   The 2019 ASCVD risk score is only valid for ages 65 to 58   Risk score cannot be calculated because patient has a medical history suggesting prior/existing ASCVD  Objective:  Physical Exam HENT:     Head: Normocephalic.   Cardiovascular:     Rate and Rhythm: Normal rate.  Pulmonary:     Effort: Pulmonary effort is normal.     Breath sounds: Normal breath sounds.   Skin:    General: Skin is warm.   Neurological:     Mental Status: She is alert. Mental status is at baseline.         Assessment And Plan:  Chronic diastolic CHF (congestive heart failure) (HCC) Assessment & Plan: Take all medications as prescribed   Pulmonary hypertension (HCC) Assessment & Plan: Chronic. Stable. Followed by cardiology.  Orders: -     POCT urinalysis dipstick  Chronic respiratory failure with hypoxia and hypercapnia (HCC) Assessment & Plan: Maintained 95% on 2L oxygen by [place] during  visit.   Requires assistance with activities of daily living (ADL) Assessment & Plan: Home health care referral done.  Orders: -     AMB Referral [facility] Care Management  Type 2 diabetes mellitus with stage 3b chronic kidney disease, without long-term current use of insulin  (HCC) Assessment & Plan: Encouraged to keep BS well controlled and avoid use of NSAIDs   Orders: -     Microalbumin / creatinine urine ratio -  BMP8+eGFR -     CBC with Differential/Platelet -     Hemoglobin A1c  Hypoxemia  COPD  GOLD III/ 02 dep  Assessment & Plan: Advised to keep oxygen on at all times.   Confusion and disorientation -     Ambulatory referral to Neurology    Return in about 3 months (around 07/21/2024) for diabetes.  Patient was given opportunity to ask questions. Patient verbalized understanding of the plan and was able to repeat key elements of the plan. All questions were answered to their satisfaction.    I, Beth Spry, NP, have reviewed all documentation for this visit. The documentation on 05/01/2024 for the exam, diagnosis, procedures, and orders are all accurate and complete.    IF YOU HAVE BEEN REFERRED TO A SPECIALIST, IT MAY TAKE 1-2 WEEKS TO SCHEDULE/PROCESS THE REFERRAL. IF YOU HAVE NOT HEARD FROM US /SPECIALIST IN TWO WEEKS, PLEASE GIVE US  A CALL AT [phone].

## 2024-04-21 ENCOUNTER — Telehealth: Payer: Self-pay

## 2024-04-21 LAB — BMP8+EGFR
BUN/Creatinine Ratio: 18 (ref 12–28)
BUN: 22 mg/dL (ref 8–27)
CO2: 32 mmol/L — ABNORMAL HIGH (ref 20–29)
Calcium: 9.5 mg/dL (ref 8.7–10.3)
Chloride: 94 mmol/L — ABNORMAL LOW (ref 96–106)
Creatinine, Ser: 1.22 mg/dL — ABNORMAL HIGH (ref 0.57–1.00)
Glucose: 157 mg/dL — ABNORMAL HIGH (ref 70–99)
Potassium: 4.8 mmol/L (ref 3.5–5.2)
Sodium: 140 mmol/L (ref 134–144)
eGFR: 44 mL/min/{1.73_m2} — ABNORMAL LOW (ref >59)

## 2024-04-21 LAB — CBC WITH DIFFERENTIAL/PLATELET
Basophils Absolute: 0 10*3/uL (ref 0.0–0.2)
Basos: 0 %
EOS (ABSOLUTE): 0.2 10*3/uL (ref 0.0–0.4)
Eos: 4 %
Hematocrit: 37.7 % (ref 34.0–46.6)
Hemoglobin: 11.7 g/dL (ref 11.1–15.9)
Immature Grans (Abs): 0 10*3/uL (ref 0.0–0.1)
Immature Granulocytes: 0 %
Lymphocytes Absolute: 0.9 10*3/uL (ref 0.7–3.1)
Lymphs: 22 %
MCH: 31.2 pg (ref 26.6–33.0)
MCHC: 31 g/dL — ABNORMAL LOW (ref 31.5–35.7)
MCV: 101 fL — ABNORMAL HIGH (ref 79–97)
Monocytes Absolute: 0.5 10*3/uL (ref 0.1–0.9)
Monocytes: 13 %
Neutrophils Absolute: 2.5 10*3/uL (ref 1.4–7.0)
Neutrophils: 61 %
Platelets: 212 10*3/uL (ref 150–450)
RBC: 3.75 x10E6/uL — ABNORMAL LOW (ref 3.77–5.28)
RDW: 13.9 % (ref 11.7–15.4)
WBC: 4.1 10*3/uL (ref 3.4–10.8)

## 2024-04-21 LAB — MICROALBUMIN / CREATININE URINE RATIO
Creatinine, Urine: 19.5 mg/dL
Microalb/Creat Ratio: 174 mg/g{creat} — ABNORMAL HIGH (ref 0–29)
Microalbumin, Urine: 34 ug/mL

## 2024-04-21 LAB — HEMOGLOBIN A1C
Est. average glucose Bld gHb Est-mCnc: 128 mg/dL
Hgb A1c MFr Bld: 6.1 % — ABNORMAL HIGH (ref 4.8–5.6)

## 2024-04-21 NOTE — Progress Notes (Signed)
 Complex Care Management Note Care Guide Note  04/21/2024 Name: Beth [name] MRN: [id number] DOB: Jan 09, 1941   Complex Care Management Outreach Attempts: An unsuccessful telephone outreach was attempted today to offer the patient information about available complex care management services.  Follow Up Plan:  Additional outreach attempts will be made to offer the patient complex care management information and services.   Encounter Outcome:  No Answer  Creola Doheny [facility], [facility] Guide  Direct Dial: [phone]  Fax [phone]

## 2024-04-25 NOTE — Progress Notes (Signed)
 Complex Care Management Note  Care Guide Note 04/25/2024 Name: Beth [name] MRN: [id number] DOB: 07-20-1941  Beth [name] is a 83 y.o. year old female who sees [name], NP for primary care. I reached out to Ron R Roulston by phone today to offer complex care management services.  [name] was given information about Complex Care Management services today including:   The Complex Care Management services include support from the care team which includes your Nurse Care Manager, Clinical Social Worker, or Pharmacist.  The Complex Care Management team is here to help remove barriers to the health concerns and goals most important to you. Complex Care Management services are voluntary, and the patient may decline or stop services at any time by request to their care team member.   Complex Care Management Consent Status: Patient agreed to services and verbal consent obtained.   Follow up plan:  Telephone appointment with complex care management team member scheduled for:  05-30-24  Encounter Outcome:  Patient Scheduled   Creola Doheny [facility] Guide  Direct Dial: [phone]  Fax [phone]

## 2024-04-26 ENCOUNTER — Telehealth: Payer: Self-pay | Admitting: Pharmacist

## 2024-04-26 DIAGNOSIS — Z79899 Other long term (current) drug therapy: Secondary | ICD-10-CM

## 2024-04-26 NOTE — Progress Notes (Signed)
 04/26/2024 Name: Beth [name] MRN: [id number] DOB: 02/25/1941  Chief Complaint  Patient presents with   Medication Management    Medication Review    Beth [name] is a 83 y.o. year old female who presented for a telephone visit.   They were referred to the pharmacist by their PCP for assistance in managing medication access.    Subjective: Beth [name] is an 83 year old female with multiple medical conditions including but not limited to: CHF, pulmonary hypertension, Hypertension, GERD, gout, hyperlipidemia and type 2 diabetes.   Care Team: Primary Care Provider: [name], NP ; Next Scheduled Visit: 07/26/2024  Medication Access/Adherence  Current Pharmacy:  [id number] [place], [place] - [address] AT [facility] [address] [place] [address] Phone: [phone] Fax: [phone]  [place], [place] - [address] [facility] [address] [place] [address] Phone: [phone] Fax: [phone]  [facility] [place], [place] - [address] [place] [address] Phone: [phone] Fax: [phone]  [place] - [address] [place] [address] Phone: [phone] Fax: [phone]  [facility] - [place], [place] - [address] [place] [address] Phone: [phone] Fax: [phone]   Patient reports affordability concerns with their medications: No  Patient reports access/transportation concerns to their pharmacy: Yes  Patient reports adherence concerns with their medications:  Yes   Says she has been requesting medications but cannot get them.   Completed a medication review with the Patient over the phone. She reported having multiple bottles of medication that the fill history data does not match.     Patient said a home care Doctor came  to her house and decreased her Sildenafil  from Sildenafil   20 mg 4 tablets three times daily to 1 tablet three times daily this is why there is a discrepancy between the refill date.  Her other medications have the following fill dates:  Albuterol  inhaler 04/24/24 Ezetimibe  10 mg 03/31/24-90- day supply Farxiga  5 mg 03/08/24 90 day supply Furosemide  40 mg 02/24/24 90 day supply Bisoprolol  Fumarate 10 mg 02/17/24 90 day supply Potassium Chloride  20 mEq 02/10/24 90 day supply Amlodipine  4 mg 02/09/24 90 day supply Aspirin  81 mg 12/30/23 90 day supply Opsumit  10 mg says she gets through a program Trelegy Ellipta  last filled 12/20/23 30 day supply--Patient reported using this every day Sildenafil  20 mg 1 tablet three times daily ls 01/25 ---this was with a different dose of 4 tablets three times daily Omeprazole 40mg  08/03/23    Objective:  Lab Results  Component Value Date   HGBA1C 6.1 (H) 04/20/2024    Lab Results  Component Value Date   CREATININE 1.22 (H) 04/20/2024   BUN 22 04/20/2024   NA 140 04/20/2024   K 4.8 04/20/2024   CL 94 (L) 04/20/2024   CO2 32 (H) 04/20/2024    Lab Results  Component Value Date   CHOL 184 04/01/2023   HDL 86 04/01/2023   LDLCALC 86 04/01/2023   TRIG 61 04/01/2023   CHOLHDL 2.1 04/01/2023    Medications Reviewed Today     Reviewed by Geronimo Krabbe, RPH (Pharmacist) on 04/26/24 at 1250  Med List Status: <None>   Medication Order Taking? Sig Documenting Provider Last Dose Status Informant  albuterol  (VENTOLIN  HFA) 108 (90 Base) MCG/ACT inhaler [id number]  Yes Inhale 1-2 puffs into the lungs every 6 (six) hours as needed for wheezing or shortness of breath. Parrett, Macdonald Savoy, NP Taking Active   amLODipine  (NORVASC ) 5 MG tablet [id number] Yes TAKE 1 TABLET BY MOUTH EVERYDAY AT BEDTIME [name], NP Taking Active   ASPIRIN  LOW DOSE 81 MG tablet [id number] Yes TAKE 1 TABLET BY MOUTH EVERY DAY Darlis Eisenmenger, MD Taking Active   atorvastatin   (LIPITOR) 80 MG tablet [id number] No TAKE 1 TABLET BY MOUTH EVERY DAY  Patient not taking: Reported on 04/26/2024   Darlis Eisenmenger, MD Not Taking Active   bisoprolol  (ZEBETA ) 10 MG tablet [id number] Yes Take 1 tablet (10 mg total) by mouth daily. Darlis Eisenmenger, MD Taking Active   clobetasol cream (TEMOVATE) 0.05 % [id number] No Apply 1 application topically as needed.   Patient not taking: Reported on 04/26/2024   [provider] Not Taking Active   ezetimibe  (ZETIA ) 10 MG tablet [id number] Yes TAKE 1 TABLET BY MOUTH EVERY DAY Darlis Eisenmenger, MD Taking Active   FARXIGA  5 MG TABS tablet [id number] Yes TAKE 1 TABLET (5 MG TOTAL) BY MOUTH DAILY. [name], NP Taking Active   Fluticasone -Umeclidin-Vilant (TRELEGY ELLIPTA ) 100-62.5-25 MCG/ACT AEPB [id number] Yes Inhale 1 puff into the lungs daily. Parrett, Macdonald Savoy, NP Taking Active   furosemide  (LASIX ) 40 MG tablet [id number] Yes TAKE 2 TABLETS BY MOUTH EVERY MORNING. CAN TAKE EXTRA DOSE EVERY OTHER AFTERNOON WITH WEIGHT GAIN. Darlis Eisenmenger, MD Taking Active   macitentan  (OPSUMIT ) 10 MG tablet [id number] Yes TAKE 1 TABLET (10MG ) BY MOUTH DAILY. Bensimhon, Rheta Celestine, MD Taking Active   omeprazole (PRILOSEC) 10 MG capsule [id number] No Take 10 mg by mouth daily.  Patient not taking: Reported on 04/26/2024   [provider] Not Taking Active   OXYGEN [id number] Yes Inhale 2 L into the lungs continuous. [provider] Taking Active Family Member  potassium chloride  SA (KLOR-CON  M20) 20 MEQ tablet [id number] Yes Take 1 tablet (20 mEq total) by mouth daily. McCord, Arlice Bene, FNP Taking Active   sildenafil  (REVATIO ) 20 MG tablet [id number] Yes TAKE 4 TABLETS (80 MG TOTAL) BY MOUTH 3 (THREE) TIMES DAILY. Darlis Eisenmenger, MD Taking Active               Assessment/Plan:   Reviewed Patient's medications Called her Pharmacy.    Pharmacist Follow-Up Summary & Next Steps:  Conducted a comprehensive review of the patient's  current medication list for accuracy and adherence.  Contacted the patient's pharmacy to verify last fill dates and clarify refill history.  Identified ongoing confusion regarding the use of Trellgy Ellipta  consistent use could not be confirmed.(Although Patient says she is using daily)  Discussed the option of an in-person follow-up with the patient; she is unable to attend next Wednesday due to transportation limitations.  Will forward a detailed note to the patient's primary care provider requesting further guidance on follow-up and support with medication management.  Follow up with the Patient via telephone in 1 month.  HgA1c - 6%  On Atorvastatin   Geronimo Krabbe, PharmD, [facility] Clinical Pharmacist [phone]

## 2024-04-27 ENCOUNTER — Ambulatory Visit: Payer: Self-pay | Admitting: Family Medicine

## 2024-05-01 ENCOUNTER — Ambulatory Visit: Payer: Self-pay | Admitting: Family Medicine

## 2024-05-01 DIAGNOSIS — Z741 Need for assistance with personal care: Secondary | ICD-10-CM | POA: Insufficient documentation

## 2024-05-01 DIAGNOSIS — R6889 Other general symptoms and signs: Secondary | ICD-10-CM | POA: Insufficient documentation

## 2024-05-01 DIAGNOSIS — R410 Disorientation, unspecified: Secondary | ICD-10-CM | POA: Insufficient documentation

## 2024-05-01 NOTE — Assessment & Plan Note (Signed)
 Home health care referral done.

## 2024-05-01 NOTE — Assessment & Plan Note (Signed)
 Advised to keep oxygen on at all times.

## 2024-05-01 NOTE — Progress Notes (Signed)
 A1c dropped slightly to 6.1 which is good keep up low carb diet. Are you still on farxiga ? but your kidney function is stable, will refer your to a nephrologist because it seems like you don't have one.  Thanks

## 2024-05-01 NOTE — Assessment & Plan Note (Addendum)
Encouraged to keep BS well controlled and avoid use of NSAIDs

## 2024-05-01 NOTE — Assessment & Plan Note (Signed)
 Chronic  Stable  Followed by cardiology

## 2024-05-01 NOTE — Assessment & Plan Note (Signed)
 Take all medications as prescribed.

## 2024-05-01 NOTE — Assessment & Plan Note (Signed)
 Neurology referral

## 2024-05-01 NOTE — Assessment & Plan Note (Signed)
 Maintained 95% on 2L oxygen by [place] during visit.

## 2024-05-12 ENCOUNTER — Other Ambulatory Visit (HOSPITAL_COMMUNITY): Payer: Self-pay | Admitting: Cardiology

## 2024-05-13 ENCOUNTER — Other Ambulatory Visit: Payer: Self-pay

## 2024-05-13 ENCOUNTER — Encounter (HOSPITAL_COMMUNITY): Payer: Self-pay | Admitting: *Deleted

## 2024-05-13 ENCOUNTER — Emergency Department (HOSPITAL_COMMUNITY)
Admission: EM | Admit: 2024-05-13 | Discharge: 2024-05-13 | Disposition: A | Discharge status: Home / Self Care | Attending: Emergency Medicine | Admitting: Emergency Medicine

## 2024-05-13 ENCOUNTER — Emergency Department (HOSPITAL_COMMUNITY)

## 2024-05-13 DIAGNOSIS — N189 Chronic kidney disease, unspecified: Secondary | ICD-10-CM | POA: Insufficient documentation

## 2024-05-13 DIAGNOSIS — E1122 Type 2 diabetes mellitus with diabetic chronic kidney disease: Secondary | ICD-10-CM | POA: Insufficient documentation

## 2024-05-13 DIAGNOSIS — R0602 Shortness of breath: Secondary | ICD-10-CM | POA: Diagnosis not present

## 2024-05-13 DIAGNOSIS — J4489 Other specified chronic obstructive pulmonary disease: Secondary | ICD-10-CM | POA: Insufficient documentation

## 2024-05-13 DIAGNOSIS — I130 Hypertensive heart and chronic kidney disease with heart failure and stage 1 through stage 4 chronic kidney disease, or unspecified chronic kidney disease: Secondary | ICD-10-CM | POA: Diagnosis not present

## 2024-05-13 DIAGNOSIS — Z9981 Dependence on supplemental oxygen: Secondary | ICD-10-CM | POA: Diagnosis not present

## 2024-05-13 DIAGNOSIS — R0902 Hypoxemia: Secondary | ICD-10-CM | POA: Diagnosis not present

## 2024-05-13 DIAGNOSIS — Z7984 Long term (current) use of oral hypoglycemic drugs: Secondary | ICD-10-CM | POA: Diagnosis not present

## 2024-05-13 DIAGNOSIS — I4891 Unspecified atrial fibrillation: Secondary | ICD-10-CM | POA: Diagnosis not present

## 2024-05-13 DIAGNOSIS — I509 Heart failure, unspecified: Secondary | ICD-10-CM | POA: Insufficient documentation

## 2024-05-13 DIAGNOSIS — R410 Disorientation, unspecified: Secondary | ICD-10-CM | POA: Diagnosis not present

## 2024-05-13 DIAGNOSIS — Z8673 Personal history of transient ischemic attack (TIA), and cerebral infarction without residual deficits: Secondary | ICD-10-CM | POA: Insufficient documentation

## 2024-05-13 DIAGNOSIS — Z7951 Long term (current) use of inhaled steroids: Secondary | ICD-10-CM | POA: Insufficient documentation

## 2024-05-13 DIAGNOSIS — Z79899 Other long term (current) drug therapy: Secondary | ICD-10-CM | POA: Insufficient documentation

## 2024-05-13 DIAGNOSIS — R4182 Altered mental status, unspecified: Secondary | ICD-10-CM | POA: Diagnosis present

## 2024-05-13 LAB — COMPREHENSIVE METABOLIC PANEL WITH GFR
ALT: 15 U/L (ref 0–44)
AST: 23 U/L (ref 15–41)
Albumin: 3.7 g/dL (ref 3.5–5.0)
Alkaline Phosphatase: 38 U/L (ref 38–126)
Anion gap: 12 (ref 5–15)
BUN: 28 mg/dL — ABNORMAL HIGH (ref 8–23)
CO2: 36 mmol/L — ABNORMAL HIGH (ref 22–32)
Calcium: 9.3 mg/dL (ref 8.9–10.3)
Chloride: 96 mmol/L — ABNORMAL LOW (ref 98–111)
Creatinine, Ser: 1.11 mg/dL — ABNORMAL HIGH (ref 0.44–1.00)
GFR, Estimated: 49 mL/min — ABNORMAL LOW (ref >60)
Glucose, Bld: 192 mg/dL — ABNORMAL HIGH (ref 70–99)
Potassium: 4.2 mmol/L (ref 3.5–5.1)
Sodium: 144 mmol/L (ref 135–145)
Total Bilirubin: 0.6 mg/dL (ref 0.0–1.2)
Total Protein: 7.3 g/dL (ref 6.5–8.1)

## 2024-05-13 LAB — URINALYSIS, ROUTINE W REFLEX MICROSCOPIC
Bacteria, UA: NONE SEEN
Bilirubin Urine: NEGATIVE
Glucose, UA: >=500 mg/dL — AB
Hgb urine dipstick: NEGATIVE
Ketones, ur: NEGATIVE mg/dL
Leukocytes,Ua: NEGATIVE
Nitrite: NEGATIVE
Protein, ur: NEGATIVE mg/dL
Specific Gravity, Urine: 1.003 — ABNORMAL LOW (ref 1.005–1.030)
pH: 7 (ref 5.0–8.0)

## 2024-05-13 LAB — TROPONIN I (HIGH SENSITIVITY)
Troponin I (High Sensitivity): 34 ng/L — ABNORMAL HIGH (ref <18)
Troponin I (High Sensitivity): 35 ng/L — ABNORMAL HIGH (ref <18)

## 2024-05-13 LAB — CBC WITH DIFFERENTIAL/PLATELET
Abs Immature Granulocytes: 0.02 10*3/uL (ref 0.00–0.07)
Basophils Absolute: 0 10*3/uL (ref 0.0–0.1)
Basophils Relative: 0 %
Eosinophils Absolute: 0 10*3/uL (ref 0.0–0.5)
Eosinophils Relative: 1 %
HCT: 40.3 % (ref 36.0–46.0)
Hemoglobin: 11.7 g/dL — ABNORMAL LOW (ref 12.0–15.0)
Immature Granulocytes: 0 %
Lymphocytes Relative: 11 %
Lymphs Abs: 0.6 10*3/uL — ABNORMAL LOW (ref 0.7–4.0)
MCH: 30.5 pg (ref 26.0–34.0)
MCHC: 29 g/dL — ABNORMAL LOW (ref 30.0–36.0)
MCV: 105.2 fL — ABNORMAL HIGH (ref 80.0–100.0)
Monocytes Absolute: 0.4 10*3/uL (ref 0.1–1.0)
Monocytes Relative: 7 %
Neutro Abs: 4.7 10*3/uL (ref 1.7–7.7)
Neutrophils Relative %: 81 %
Platelets: 208 10*3/uL (ref 150–400)
RBC: 3.83 MIL/uL — ABNORMAL LOW (ref 3.87–5.11)
RDW: 14.9 % (ref 11.5–15.5)
WBC: 5.8 10*3/uL (ref 4.0–10.5)
nRBC: 0 % (ref 0.0–0.2)

## 2024-05-13 LAB — RESP PANEL BY RT-PCR (RSV, FLU A&B, COVID)  RVPGX2
Influenza A by PCR: NEGATIVE
Influenza B by PCR: NEGATIVE
Resp Syncytial Virus by PCR: NEGATIVE
SARS Coronavirus 2 by RT PCR: NEGATIVE

## 2024-05-13 LAB — I-STAT CG4 LACTIC ACID, ED
Lactic Acid, Venous: 2 mmol/L (ref 0.5–1.9)
Lactic Acid, Venous: 1 mmol/L (ref 0.5–1.9)

## 2024-05-13 NOTE — ED Provider Notes (Signed)
 [facility]PART[facility] AT [facility] Provider Note   CSN: [id number] Arrival date & time: 05/13/24  1637     Patient presents with: Altered Mental Status   Beth [name] is a 83 y.o. female.    Altered Mental Status Patient brought in for mental status change.  History of CHF and COPD.  On chronic oxygen.  Oxygenation had decreased.  States she just felt a little bad.  No fever or chills.  No cough.    Past Medical History:  Diagnosis Date   Arthritis    Asthma    CHF (congestive heart failure) (HCC)    CKD (chronic kidney disease)    COPD (chronic obstructive pulmonary disease) (HCC)    dR. wERT   Diabetes mellitus (HCC)    type 2   Gout    Hyperlipidemia    Hypertension    Shortness of breath    WITH EXERTION    Sleep apnea    USES  CPAP    Stroke [facility])     Prior to Admission medications   Medication Sig Start Date End Date Taking? Authorizing Provider  albuterol  (VENTOLIN  HFA) 108 (90 Base) MCG/ACT inhaler Inhale 1-2 puffs into the lungs every 6 (six) hours as needed for wheezing or shortness of breath. 01/27/24   Parrett, Madelin RAMAN, NP  amLODipine  (NORVASC ) 5 MG tablet TAKE 1 TABLET BY MOUTH EVERYDAY AT BEDTIME 02/09/24   Petrina Pries, NP  ASPIRIN  LOW DOSE 81 MG tablet TAKE 1 TABLET BY MOUTH EVERY DAY 08/31/23   McLean, Dalton S, MD  atorvastatin  (LIPITOR) 80 MG tablet TAKE 1 TABLET BY MOUTH EVERY DAY Patient not taking: Reported on 04/26/2024 11/30/23   Rolan Ezra RAMAN, MD  bisoprolol  (ZEBETA ) 10 MG tablet Take 1 tablet (10 mg total) by mouth daily. 03/18/23   Rolan Ezra RAMAN, MD  clobetasol cream (TEMOVATE) 0.05 % Apply 1 application topically as needed.  Patient not taking: Reported on 04/26/2024 12/31/19   [provider]  ezetimibe  (ZETIA ) 10 MG tablet TAKE 1 TABLET BY MOUTH EVERY DAY 03/31/24   McLean, Dalton S, MD  FARXIGA  5 MG TABS tablet TAKE 1 TABLET (5 MG TOTAL) BY MOUTH DAILY. 03/07/24   Petrina Pries, NP   Fluticasone -Umeclidin-Vilant (TRELEGY ELLIPTA ) 100-62.5-25 MCG/ACT AEPB Inhale 1 puff into the lungs daily. 07/30/23   Parrett, Madelin RAMAN, NP  furosemide  (LASIX ) 40 MG tablet TAKE 2 TABLETS BY MOUTH EVERY MORNING. CAN TAKE EXTRA DOSE EVERY OTHER AFTERNOON WITH WEIGHT GAIN. 11/30/23   Rolan Ezra RAMAN, MD  macitentan  (OPSUMIT ) 10 MG tablet TAKE 1 TABLET (10MG ) BY MOUTH DAILY. 05/07/23   Bensimhon, Toribio JONELLE, MD  omeprazole (PRILOSEC) 10 MG capsule Take 10 mg by mouth daily. Patient not taking: Reported on 04/26/2024    [provider]  OXYGEN Inhale 2 L into the lungs continuous.    [provider]  potassium chloride  SA (KLOR-CON  M20) 20 MEQ tablet Take 1 tablet (20 mEq total) by mouth daily. 09/20/23   [place], Harlene HERO, FNP  sildenafil  (REVATIO ) 20 MG tablet TAKE 4 TABLETS (80 MG TOTAL) BY MOUTH 3 (THREE) TIMES DAILY. 04/28/23   Rolan Ezra RAMAN, MD    Allergies: Aldomet [methyldopa] and Tyvaso  [treprostinil ]    Review of Systems  Updated Vital Signs BP (!) 144/62   Pulse 82   Temp 98.4 F (36.9 C)   Resp (!) 26   Ht 5' 2 (1.575 m)   Wt 49.9 kg   SpO2 100%  BMI 20.12 kg/m   Physical Exam Vitals and nursing note reviewed.   Cardiovascular:     Rate and Rhythm: Normal rate and regular rhythm.  Pulmonary:     Comments: Mildly harsh breath sounds. Abdominal:     Tenderness: There is no abdominal tenderness.   Musculoskeletal:        General: No tenderness.     Cervical back: Neck supple.   Skin:    General: Skin is warm.   Neurological:     Mental Status: She is alert and oriented to person, place, and time.     (all labs ordered are listed, but only abnormal results are displayed) Labs Reviewed  COMPREHENSIVE METABOLIC PANEL WITH GFR - Abnormal; Notable for the following components:      Result Value   Chloride 96 (*)    CO2 36 (*)    Glucose, Bld 192 (*)    BUN 28 (*)    Creatinine, Ser 1.11 (*)    GFR, Estimated 49 (*)    All other components  within normal limits  CBC WITH DIFFERENTIAL/PLATELET - Abnormal; Notable for the following components:   RBC 3.83 (*)    Hemoglobin 11.7 (*)    MCV 105.2 (*)    MCHC 29.0 (*)    Lymphs Abs 0.6 (*)    All other components within normal limits  URINALYSIS, ROUTINE W REFLEX MICROSCOPIC - Abnormal; Notable for the following components:   Color, Urine COLORLESS (*)    Specific Gravity, Urine 1.003 (*)    Glucose, UA >=500 (*)    All other components within normal limits  I-STAT CG4 LACTIC ACID, ED - Abnormal; Notable for the following components:   Lactic Acid, Venous 2.0 (*)    All other components within normal limits  TROPONIN I (HIGH SENSITIVITY) - Abnormal; Notable for the following components:   Troponin I (High Sensitivity) 34 (*)    All other components within normal limits  TROPONIN I (HIGH SENSITIVITY) - Abnormal; Notable for the following components:   Troponin I (High Sensitivity) 35 (*)    All other components within normal limits  RESP PANEL BY RT-PCR (RSV, FLU A&B, COVID)  RVPGX2  I-STAT CG4 LACTIC ACID, ED    EKG: EKG Interpretation Date/Time:  Saturday May 13 2024 16:49:04 EDT Ventricular Rate:  119 PR Interval:    QRS Duration:  124 QT Interval:  336 QTC Calculation: 472 R Axis:   89  Text Interpretation: Atrial fibrillation with rapid ventricular response Right bundle branch block T wave abnormality, consider inferior ischemia Abnormal ECG When compared with ECG of 20-Sep-2023 08:42, afib is new Confirmed by Patsey Lot [phone]) on 05/13/2024 10:19:48 PM  Radiology: [facility] Chest 2 View Result Date: 05/13/2024 CLINICAL DATA:  shortness of breath EXAM: CHEST - 2 VIEW COMPARISON:  July 30, 2023 FINDINGS: Hyperexpanded lung/barrel chest. Chronic coarsening of the pulmonary interstitium without overt pulmonary edema, likely due to underlying emphysema. No focal airspace consolidation, pleural effusion, or pneumothorax. No cardiomegaly. Tortuous aorta with aortic  atherosclerosis. No acute fracture or destructive lesions. Multilevel thoracic osteophytosis. Osteopenia. IMPRESSION: Emphysema.  No acute cardiopulmonary abnormality. Electronically Signed   By: Rogelia Myers M.D.   On: 05/13/2024 18:11     Procedures   Medications Ordered in the ED - No data to display          CHA2DS2-VASc Score: 5  Medical Decision Making Patient with mental status change.  Differential diagnosis includes infection, fever infection hypoxia.  Workup reassuring.  Blood work reassuring.  However EKG does show potential atrial fibrillation.  Less likely multifocal atrial tachycardia.  However resolved upon being on the monitor in the room.  Discussed with patient and daughter.  Will have follow-up with cardiology.  Will not start with anticoagulant at this time and let cardiology decide.  Appears stable for discharge home.      Final diagnoses:  Confusion  Atrial fibrillation, unspecified type [facility])    ED Discharge Orders          Ordered    Ambulatory referral to Cardiology       Comments: If you have not heard from the Cardiology office within the next 72 hours please call [phone].   05/13/24 2220               Patsey Lot, MD 05/13/24 2233

## 2024-05-13 NOTE — ED Triage Notes (Signed)
 The pt is on 2 liters of nasal 02 but her 02 sats are in the high 80s

## 2024-05-13 NOTE — ED Provider Triage Note (Signed)
 Emergency Medicine Provider Triage Evaluation Note  Beth [name] , a 83 y.o. female  was evaluated in triage.  Pt complains of altered mental status. Patient adamant that she has has no complaints, doesn't want to be here. Here at request of her daughter who says she's been more confused recently. Last seen normal was Thursday. Patient on 2L o2 at baseline. She is alert and oriented x 3. Noted to be hypoxic in the 80s on her home O2 and in afib rvr. She denies any shortness of breath, chest pain, nausea, or vomiting. Daughter does note that when she has been like this before she has had a UTI.   Review of Systems  Positive:  Negative:   Physical Exam  BP (!) 171/78 (BP Location: Right Arm)   Pulse (!) 52   Temp 98.4 F (36.9 C)   Resp 18   Ht 5' 2 (1.575 m)   Wt 49.9 kg   SpO2 (!) 86%   BMI 20.12 kg/m  Gen:   Awake, no distress   Resp:  Normal effort  MSK:   Moves extremities without difficulty  Other:  Alert and oriented and neurologically intact without focal deficits.  Medical Decision Making  Medically screening exam initiated at 4:54 PM.  Appropriate orders placed.  Floretta R Ned was informed that the remainder of the evaluation will be completed by another provider, this initial triage assessment does not replace that evaluation, and the importance of remaining in the ED until their evaluation is complete.  Patient hypoxic in the 80s on home O2, EKG shows A-fib RVR at a rate of 119.  Chart reviewed, no listed history of A-fib.  She is not anticoagulated.   Nora Lauraine LABOR, PA-C 05/13/24 1702

## 2024-05-13 NOTE — ED Triage Notes (Signed)
 The pt is alert oriented x 3  she reports that she lives with her son  her daughter brought her in and the pt reports that her daughter thinks she is confused all day today   the daughter reports  that the confusion was there yesterday

## 2024-05-15 ENCOUNTER — Other Ambulatory Visit (HOSPITAL_COMMUNITY): Payer: Self-pay | Admitting: Cardiology

## 2024-05-16 ENCOUNTER — Inpatient Hospital Stay (HOSPITAL_COMMUNITY)
Admission: RE | Admit: 2024-05-16 | Discharge: 2024-05-16 | Disposition: A | Discharge status: Home / Self Care | Source: Ambulatory Visit | Attending: Internal Medicine | Admitting: Internal Medicine

## 2024-05-16 ENCOUNTER — Ambulatory Visit (HOSPITAL_COMMUNITY)
Admission: RE | Admit: 2024-05-16 | Discharge: 2024-05-16 | Disposition: A | Discharge status: Home / Self Care | Source: Ambulatory Visit | Attending: Internal Medicine | Admitting: Internal Medicine

## 2024-05-16 VITALS — BP 176/68 | HR 69 | Ht 62.0 in | Wt 107.8 lb

## 2024-05-16 DIAGNOSIS — I4891 Unspecified atrial fibrillation: Secondary | ICD-10-CM

## 2024-05-16 DIAGNOSIS — D6869 Other thrombophilia: Secondary | ICD-10-CM | POA: Diagnosis not present

## 2024-05-16 DIAGNOSIS — I480 Paroxysmal atrial fibrillation: Secondary | ICD-10-CM

## 2024-05-16 MED ORDER — POTASSIUM CHLORIDE CRYS ER 20 MEQ PO TBCR: 20.0000 meq | EXTENDED_RELEASE_TABLET | Freq: Three times a day (TID) | ORAL | Status: DC

## 2024-05-16 NOTE — Patient Instructions (Addendum)
 Wear Monitor for 2 weeks - take off on July 15th and mail in - once results reviewed by provider will follow up regarding next steps/follow up.

## 2024-05-16 NOTE — Progress Notes (Signed)
 Primary Care Physician: Petrina Pries, NP Primary Cardiologist: None Electrophysiologist: None     Referring Physician: ED     Beth [name] is a 83 y.o. 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 ED visit on 6/28 for AMS found to be in new Afib; spontaneously converted to NSR and not discharged on OAC. Patient has a CHADS2VASC score of 7.  On evaluation today, she is currently in NSR. Patient has question about how much potassium she is supposed to be taking.   Today, she denies symptoms of palpitations, chest pain, shortness of breath, orthopnea, PND, lower extremity edema, dizziness, presyncope, syncope, snoring, daytime somnolence, bleeding, or neurologic sequela. The patient is tolerating medications without difficulties and is otherwise without complaint today.    she has a BMI of Body mass index is 19.72 kg/m.SABRA Filed Weights   05/16/24 1030  Weight: 48.9 kg    Current Outpatient Medications  Medication Sig Dispense Refill   albuterol  (VENTOLIN  HFA) 108 (90 Base) MCG/ACT inhaler Inhale 1-2 puffs into the lungs every 6 (six) hours as needed for wheezing or shortness of breath. 1 each 3   amLODipine  (NORVASC ) 5 MG tablet TAKE 1 TABLET BY MOUTH EVERYDAY AT BEDTIME 90 tablet 1   ASPIRIN  LOW DOSE 81 MG tablet TAKE 1 TABLET BY MOUTH EVERY DAY 90 tablet 3   bisoprolol  (ZEBETA ) 10 MG tablet Take 1 tablet (10 mg total) by mouth daily. 90 tablet 3   clobetasol cream (TEMOVATE) 0.05 % Apply 1 application topically as needed.      ezetimibe  (ZETIA ) 10 MG tablet TAKE 1 TABLET BY MOUTH EVERY DAY 90 tablet 0   FARXIGA  5 MG TABS tablet TAKE 1 TABLET (5 MG TOTAL) BY MOUTH DAILY. 90 tablet 1   Fluticasone -Umeclidin-Vilant (TRELEGY ELLIPTA )  100-62.5-25 MCG/ACT AEPB Inhale 1 puff into the lungs daily. 60 each 11   furosemide  (LASIX ) 40 MG tablet TAKE 2 TABLETS BY MOUTH EVERY MORNING. CAN TAKE EXTRA DOSE EVERY OTHER AFTERNOON WITH WEIGHT GAIN. 270 tablet 3   macitentan  (OPSUMIT ) 10 MG tablet TAKE 1 TABLET (10MG ) BY MOUTH DAILY. 90 tablet 3   OXYGEN Inhale 2 L into the lungs continuous.     sildenafil  (REVATIO ) 20 MG tablet TAKE 4 TABLETS (80 MG TOTAL) BY MOUTH 3 (THREE) TIMES DAILY. 160 tablet 0   atorvastatin  (LIPITOR) 80 MG tablet TAKE 1 TABLET BY MOUTH EVERY DAY (Patient not taking: Reported on 05/16/2024) 90 tablet 0   potassium chloride  SA (KLOR-CON  M20) 20 MEQ tablet Take 1 tablet (20 mEq total) by mouth 3 (three) times daily.     No current facility-administered medications for this encounter.    Atrial Fibrillation Management history:  Previous antiarrhythmic drugs: none Previous cardioversions: none Previous ablations: none Anticoagulation history: none   ROS- All systems are reviewed and negative except as per the HPI above.  Physical Exam: BP (!) 176/68   Pulse 69   Ht 5' 2 (1.575 m)   Wt 48.9 kg   BMI 19.72 kg/m   GEN: Well nourished, well developed in no acute distress NECK: No JVD; No carotid bruits CARDIAC: Regular rate and rhythm, no murmurs, rubs, gallops RESPIRATORY:  Clear to auscultation without rales, wheezing or rhonchi  ABDOMEN: Soft, non-tender, non-distended EXTREMITIES:  No edema; No deformity  EKG today demonstrates  Vent. rate 69 BPM PR interval 144 ms QRS duration 122 ms QT/QTcB 402/430 ms P-R-T axes 81 102 39 Sinus rhythm with Premature atrial complexes Right bundle branch block Abnormal ECG When compared with ECG of 13-May-2024 16:49, Sinus rhythm has replaced Atrial fibrillation  Echo 03/18/23 demonstrated   1. Left ventricular ejection fraction, by estimation, is 70 to 75%. The  left ventricle has hyperdynamic function. The left ventricle has no  regional wall motion  abnormalities. Left ventricular diastolic parameters  are consistent with Grade I diastolic  dysfunction (impaired relaxation). LV mid-cavity gradient peak 32 mmHg  with valsalva.   2. Right ventricular systolic function is normal. The right ventricular  size is normal. Tricuspid regurgitation signal is inadequate for assessing  PA pressure.   3. Left atrial size was moderately dilated.   4. The mitral valve is normal in structure. Mild mitral valve  regurgitation. No evidence of mitral stenosis.   5. The aortic valve is tricuspid. There is moderate calcification of the  aortic valve. Aortic valve regurgitation is not visualized. Mild aortic  valve stenosis. Aortic valve area, by VTI measures 1.55 cm. Aortic valve  mean gradient measures 12.0 mmHg.   6. The inferior vena cava is normal in size with greater than 50%  respiratory variability, suggesting right atrial pressure of 3 mmHg.   ASSESSMENT & PLAN CHA2DS2-VASc Score = 7  The patient's score is based upon: CHF History: 1 HTN History: 1 Diabetes History: 1 Stroke History: 0 Vascular Disease History: 1 Age Score: 2 Gender Score: 1       ASSESSMENT AND PLAN: Paroxysmal Atrial Fibrillation (ICD10:  I48.0) The patient's CHA2DS2-VASc score is 7, indicating a 11.2% annual risk of stroke.    She is currently in NSR. Education provided about Afib. We had a discussion about risk of stroke related to Afib. We will place a cardiac monitor to assess PAF burden. Rhythm monitoring device recommended.    Secondary Hypercoagulable State (ICD10:  D68.69) The patient is at significant risk for stroke/thromboembolism based upon her CHA2DS2-VASc Score of 7.   We had a discussion about risk of bleeding related to OAC. We talked about the benefits of stroke prevention. After discussion, we will hold on starting OAC until either monitor results are back or patient's daughter notes Afib on [employer]. Would begin Eliquis 2.5 mg BID based on  patient's age and weight < 60 kg.    Follow up will be based on monitor results.   Terra Pac, PA-C  Afib [facility] [address] [place], [place] [address] [phone]

## 2024-05-23 ENCOUNTER — Other Ambulatory Visit: Payer: Self-pay | Admitting: Adult Health

## 2024-05-30 ENCOUNTER — Other Ambulatory Visit: Payer: Self-pay

## 2024-05-30 ENCOUNTER — Telehealth: Payer: Self-pay | Admitting: Pharmacist

## 2024-05-30 DIAGNOSIS — I5032 Chronic diastolic (congestive) heart failure: Secondary | ICD-10-CM

## 2024-05-30 DIAGNOSIS — N1832 Chronic kidney disease, stage 3b: Secondary | ICD-10-CM

## 2024-05-30 DIAGNOSIS — E1122 Type 2 diabetes mellitus with diabetic chronic kidney disease: Secondary | ICD-10-CM

## 2024-05-30 DIAGNOSIS — I10 Essential (primary) hypertension: Secondary | ICD-10-CM

## 2024-05-30 NOTE — Progress Notes (Signed)
   05/30/2024  Patient ID: [name], female   DOB: 28-Nov-1940, 83 y.o.   MRN: [id number]  [name] Maryjane) was called regarding medication management with the potential for adherence packing.  Unfortunately, she did not answer the phone.  HIPAA compliant message was left on her voicemail.  The purpose of the call was to attmept to schedule a time when the Patient could come to the office with her medication bottles for a complete medication review.  Plan: [employer]. Follow up with Patient in 3-5 business days.   Cassius DOROTHA Brought, PharmD, BCACP Clinical Pharmacist [phone]

## 2024-05-30 NOTE — Patient Instructions (Signed)
 Visit Information  Thank you for taking time to visit with me today. Please don't hesitate to contact me if I can be of assistance to you before our next scheduled appointment.  Our next appointment is by telephone on Wednesday, August 13 at 10:30 AM Please call the care guide team at [phone] if you need to cancel or reschedule your appointment.   Following is a copy of your care plan:   Goals Addressed             This Visit's Progress    VBCI RN Care Plan related to Atrial Fibrillation       Problems:  Chronic Disease Management support and education needs related to Atrial Fibrillation  Goal: Over the next 90 days the Patient will continue to work with RN Care Manager and/or Social Worker to address care management and care coordination needs related to Atrial Fibrillation as evidenced by adherence to care management team scheduled appointments      Interventions:   AFIB Interventions:   Counseled on increased risk of stroke due to Afib and benefits of anticoagulation for stroke prevention Counseled on importance of regular laboratory monitoring as prescribed  Patient Self-Care Activities:  Attend all scheduled provider appointments Call pharmacy for medication refills 3-7 days in advance of running out of medications Call provider office for new concerns or questions  Take medications as prescribed   Work with the social worker to address care coordination needs and will continue to work with the clinical team to address health care and disease management related needs Work with the pharmacist to address medication management needs and will continue to work with the clinical team to address health care and disease management related needs keep all lab appointments  Plan:  Follow up with provider re: heart monitor results  Telephone follow up appointment with care management team member scheduled for:  Wednesday, August 13 at 10:30 AM          VBCI RN Care Plan  related to diastolic congestive heart failure       Problems:  Chronic Disease Management support and education needs related to CHF  Goal: Over the next 90 days the Patient will continue to work with Medical illustrator and/or Social Worker to address care management and care coordination needs related to CHF as evidenced by adherence to care management team scheduled appointments      Interventions:   Heart Failure Interventions: Basic overview and discussion of pathophysiology of Heart Failure reviewed Provided education on low sodium diet Reviewed Heart Failure Action Plan in depth and provided written copy Provided education about placing scale on hard, flat surface Advised patient to weigh each morning after emptying bladder Discussed importance of daily weight and advised patient to weigh and record daily Reviewed role of diuretics in prevention of fluid overload and management of heart failure; Discussed the importance of keeping all appointments with provider  Patient Self-Care Activities:  Attend all scheduled provider appointments Call pharmacy for medication refills 3-7 days in advance of running out of medications Call provider office for new concerns or questions  Take medications as prescribed   Work with the social worker to address care coordination needs and will continue to work with the clinical team to address health care and disease management related needs Work with the pharmacist to address medication management needs and will continue to work with the clinical team to address health care and disease management related needs call office if I gain more  than 2 pounds in one day or 5 pounds in one week use salt in moderation watch for swelling in feet, ankles and legs every day weigh myself daily develop a rescue plan follow rescue plan if symptoms flare-up track symptoms and what helps feel better or worse  Plan:  Follow up with provider re: heart failure with Dr.  Rolan on 06/05/24 at 11:40 AM Telephone follow up appointment with care management team member scheduled for:  Wednesday, August 13 at 10:30 AM          VBCI RN Care Plan related to fall risk and self care deficit       Problems:  Chronic Disease Management support and education needs related to fall risk and self care deficit   Goal: Over the next 90 days the Patient will continue to work with Medical illustrator and/or Social Worker to address care management and care coordination needs related to fall risk and self care deficit as evidenced by adherence to care management team scheduled appointments      Interventions:   Falls Interventions: Provided written and verbal education re: potential causes of falls and Fall prevention strategies Reviewed medications and discussed potential side effects of medications such as dizziness and frequent urination Advised patient of importance of notifying provider of falls Assessed for signs and symptoms of orthostatic hypotension Referral sent to Tobias Moose BSW to assess for Medicaid eligibility and or other care giver options Sent an in basket message to PCP requesting a PT home safety evaluation   Patient Self-Care Activities:  Attend all scheduled provider appointments Call pharmacy for medication refills 3-7 days in advance of running out of medications Call provider office for new concerns or questions  Take medications as prescribed   Work with the social worker to address care coordination needs and will continue to work with the clinical team to address health care and disease management related needs  Plan:  Telephone follow up appointment with care management team member scheduled for:  Wednesday, August 13 at 10:30 AM             Please call 1-800-273-TALK (toll free, 24 hour hotline) if you are experiencing a Mental Health or Behavioral Health Crisis or need someone to talk to.  Patient verbalizes understanding of  instructions and care plan provided today and agrees to view in MyChart. Active MyChart status and patient understanding of how to access instructions and care plan via MyChart confirmed with patient.     Clayborne Ly RN BSN CCM [place]  [facility], [facility] Nurse Care Coordinator  Direct Dial: [phone] Website: [email or web address]

## 2024-05-30 NOTE — Patient Outreach (Addendum)
 Complex Care Management   Visit Note  05/30/2024  Name:  Beth [name] MRN: [id number] DOB: 1941-10-15  Situation: Referral received for Complex Care Management related to Heart Failure, COPD, and Chronic Kidney Disease, type 2 Diabetes, Pulmonary Hypertension. I obtained verbal consent from Patient.  Visit completed with patient on the phone.  Background:   Past Medical History:  Diagnosis Date   Arthritis    Asthma    CHF (congestive heart failure) (HCC)    CKD (chronic kidney disease)    COPD (chronic obstructive pulmonary disease) (HCC)    dR. wERT   Diabetes mellitus (HCC)    type 2   Gout    Hyperlipidemia    Hypertension    Shortness of breath    WITH EXERTION    Sleep apnea    USES  CPAP    Stroke [facility])     Assessment: Patient Reported Symptoms:  Cognitive Cognitive Status: Alert and oriented to person, place, and time, Struggling with memory recall Cognitive/Intellectual Conditions Management [RPT]: None reported or documented in medical history or problem list   Health Maintenance Behaviors: Annual physical exam, Healthy diet, Social activities Health Facilitated by: Healthy diet  Neurological Neurological Review of Symptoms: No symptoms reported    HEENT HEENT Symptoms Reported: No symptoms reported      Cardiovascular Cardiovascular Symptoms Reported: Irregular pulse Does patient have uncontrolled Hypertension?: Yes Is patient checking Blood Pressure at home?: No Cardiovascular Management Strategies: Medication therapy, Routine screening Cardiovascular Self-Management Outcome: 3 (uncertain) Cardiovascular Comment: patient will mail in her long term monitor today to evaluate for Afib  Respiratory Respiratory Symptoms Reported: Shortness of breath Respiratory Management Strategies: Oxygen therapy, Routine screening, Medication therapy Respiratory Self-Management Outcome: 4 (good)  Endocrine Endocrine Symptoms Reported: No symptoms reported Is patient  diabetic?: Yes Is patient checking blood sugars at home?: No Endocrine Self-Management Outcome: 4 (good)  Gastrointestinal Gastrointestinal Symptoms Reported: No symptoms reported      Genitourinary Genitourinary Symptoms Reported: Incontinence Genitourinary Management Strategies: Incontinence garment/pad Genitourinary Self-Management Outcome: 4 (good)  Integumentary Integumentary Symptoms Reported: Itching Skin Management Strategies: Medication therapy, Routine screening Skin Self-Management Outcome: 4 (good)  Musculoskeletal Musculoskelatal Symptoms Reviewed: Difficulty walking, Limited mobility, Unsteady gait   Falls in the past year?: No Number of falls in past year: 1 or less Was there an injury with Fall?: No Fall Risk Category Calculator: 0 Patient Fall Risk Level: Low Fall Risk Patient at Risk for Falls Due to: Impaired balance/gait, Impaired mobility Fall risk Follow up: Falls evaluation completed  Psychosocial Psychosocial Symptoms Reported: No symptoms reported   Major Change/Loss/Stressor/Fears (CP): Denies Quality of Family Relationships: involved, helpful, supportive Do you feel physically threatened by others?: No      05/30/2024   12:09 PM  Depression screen PHQ 2/9  Decreased Interest 0  Down, Depressed, Hopeless 0  PHQ - 2 Score 0    There were no vitals filed for this visit.  Medications Reviewed Today     Reviewed by Morgan Clayborne CROME, RN (Registered Nurse) on 05/30/24 at 1202  Med List Status: <None>   Medication Order Taking? Sig Documenting Provider Last Dose Status Informant  albuterol  (VENTOLIN  HFA) 108 (90 Base) MCG/ACT inhaler [id number] Yes INHALE 1-2 PUFFS BY MOUTH EVERY 6 HOURS AS NEEDED FOR WHEEZE OR SHORTNESS OF BREATH Parrett, Tammy S, NP  Active   amLODipine  (NORVASC ) 5 MG tablet [id number] Yes TAKE 1 TABLET BY MOUTH EVERYDAY AT BEDTIME Petrina Pries, NP  Active   ASPIRIN  LOW  DOSE 81 MG tablet [id number] Yes TAKE 1 TABLET BY MOUTH EVERY DAY  McLean, Dalton S, MD  Active   atorvastatin  (LIPITOR) 80 MG tablet [id number]  TAKE 1 TABLET BY MOUTH EVERY DAY  Patient not taking: Reported on 05/30/2024   Rolan Ezra RAMAN, MD  Active   bisoprolol  (ZEBETA ) 10 MG tablet [id number] Yes TAKE 1 TABLET BY MOUTH EVERY DAY Rolan Ezra RAMAN, MD  Active   clobetasol cream (TEMOVATE) 0.05 % [id number] Yes Apply 1 application topically as needed.  [provider]  Active   ezetimibe  (ZETIA ) 10 MG tablet [id number] Yes TAKE 1 TABLET BY MOUTH EVERY DAY Rolan Ezra RAMAN, MD  Active   FARXIGA  5 MG TABS tablet [id number] Yes TAKE 1 TABLET (5 MG TOTAL) BY MOUTH DAILY. Petrina Pries, NP  Active   Fluticasone -Umeclidin-Vilant (TRELEGY ELLIPTA ) 100-62.5-25 MCG/ACT AEPB [id number] Yes Inhale 1 puff into the lungs daily. Parrett, Madelin RAMAN, NP  Active   furosemide  (LASIX ) 40 MG tablet [id number] Yes TAKE 2 TABLETS BY MOUTH EVERY MORNING. CAN TAKE EXTRA DOSE EVERY OTHER AFTERNOON WITH WEIGHT GAIN. Rolan Ezra RAMAN, MD  Active   macitentan  (OPSUMIT ) 10 MG tablet [id number] Yes TAKE 1 TABLET (10MG ) BY MOUTH DAILY. Bensimhon, Toribio SAUNDERS, MD  Active   OXYGEN [id number] Yes Inhale 2 L into the lungs continuous. [provider]  Active Family Member  potassium chloride  SA (KLOR-CON  M20) 20 MEQ tablet [id number] Yes Take 1 tablet (20 mEq total) by mouth 3 (three) times daily. Terra Fairy PARAS, PA-C  Active   sildenafil  (REVATIO ) 20 MG tablet [id number] Yes TAKE 4 TABLETS (80 MG TOTAL) BY MOUTH 3 (THREE) TIMES DAILY. Rolan Ezra RAMAN, MD  Active             Recommendation:   Specialty provider follow-up on 06/05/24 at 11:40 AM with Dr. Rolan   Follow Up Plan:   Telephone follow up appointment date/time:  Wednesday, August 13 at 10:30 AM Referral to Pharmacist BSW   Clayborne Ly RN BSN CCM [facility]  [facility], [facility] Nurse Care Coordinator  Direct Dial: [phone] Website: [email or web address]

## 2024-06-02 ENCOUNTER — Telehealth: Payer: Self-pay | Admitting: *Deleted

## 2024-06-02 NOTE — Progress Notes (Signed)
 carComplex Care Management Note Care Guide Note  06/02/2024 Name: Beth [name] MRN: [id number] DOB: [birth year]   Complex Care Management Outreach Attempts: An unsuccessful telephone outreach was attempted today to offer the patient information about available complex care management services.  Follow Up Plan:  Additional outreach attempts will be made to offer the patient complex care management information and services.   Encounter Outcome:  No Answer  Asencion Randee Pack HealthPopulation Health Care Guide  Direct Dial:[phone] [phone] Website: [email or web address]

## 2024-06-05 ENCOUNTER — Ambulatory Visit (HOSPITAL_COMMUNITY): Payer: Self-pay | Admitting: Cardiology

## 2024-06-05 ENCOUNTER — Ambulatory Visit (HOSPITAL_COMMUNITY)
Admission: RE | Admit: 2024-06-05 | Discharge: 2024-06-05 | Disposition: A | Discharge status: Home / Self Care | Source: Ambulatory Visit | Attending: Cardiology | Admitting: Cardiology

## 2024-06-05 ENCOUNTER — Encounter (HOSPITAL_COMMUNITY): Payer: Self-pay | Admitting: Cardiology

## 2024-06-05 ENCOUNTER — Telehealth: Payer: Self-pay | Admitting: Pharmacist

## 2024-06-05 ENCOUNTER — Telehealth: Payer: Self-pay | Admitting: *Deleted

## 2024-06-05 VITALS — BP 180/82 | HR 73 | Ht 62.0 in | Wt 104.0 lb

## 2024-06-05 DIAGNOSIS — Z79899 Other long term (current) drug therapy: Secondary | ICD-10-CM | POA: Diagnosis not present

## 2024-06-05 DIAGNOSIS — N189 Chronic kidney disease, unspecified: Secondary | ICD-10-CM | POA: Insufficient documentation

## 2024-06-05 DIAGNOSIS — I471 Supraventricular tachycardia, unspecified: Secondary | ICD-10-CM | POA: Insufficient documentation

## 2024-06-05 DIAGNOSIS — I350 Nonrheumatic aortic (valve) stenosis: Secondary | ICD-10-CM | POA: Diagnosis not present

## 2024-06-05 DIAGNOSIS — Z8249 Family history of ischemic heart disease and other diseases of the circulatory system: Secondary | ICD-10-CM | POA: Diagnosis not present

## 2024-06-05 DIAGNOSIS — G4733 Obstructive sleep apnea (adult) (pediatric): Secondary | ICD-10-CM | POA: Diagnosis not present

## 2024-06-05 DIAGNOSIS — Z9981 Dependence on supplemental oxygen: Secondary | ICD-10-CM | POA: Diagnosis not present

## 2024-06-05 DIAGNOSIS — I4510 Unspecified right bundle-branch block: Secondary | ICD-10-CM | POA: Insufficient documentation

## 2024-06-05 DIAGNOSIS — E785 Hyperlipidemia, unspecified: Secondary | ICD-10-CM | POA: Insufficient documentation

## 2024-06-05 DIAGNOSIS — I10 Essential (primary) hypertension: Secondary | ICD-10-CM

## 2024-06-05 DIAGNOSIS — E1122 Type 2 diabetes mellitus with diabetic chronic kidney disease: Secondary | ICD-10-CM

## 2024-06-05 DIAGNOSIS — Z87891 Personal history of nicotine dependence: Secondary | ICD-10-CM | POA: Diagnosis not present

## 2024-06-05 DIAGNOSIS — Z7901 Long term (current) use of anticoagulants: Secondary | ICD-10-CM | POA: Insufficient documentation

## 2024-06-05 DIAGNOSIS — I5032 Chronic diastolic (congestive) heart failure: Secondary | ICD-10-CM

## 2024-06-05 DIAGNOSIS — I498 Other specified cardiac arrhythmias: Secondary | ICD-10-CM | POA: Diagnosis not present

## 2024-06-05 DIAGNOSIS — I491 Atrial premature depolarization: Secondary | ICD-10-CM | POA: Insufficient documentation

## 2024-06-05 DIAGNOSIS — I130 Hypertensive heart and chronic kidney disease with heart failure and stage 1 through stage 4 chronic kidney disease, or unspecified chronic kidney disease: Secondary | ICD-10-CM | POA: Insufficient documentation

## 2024-06-05 DIAGNOSIS — I272 Pulmonary hypertension, unspecified: Secondary | ICD-10-CM | POA: Diagnosis present

## 2024-06-05 DIAGNOSIS — I480 Paroxysmal atrial fibrillation: Secondary | ICD-10-CM | POA: Insufficient documentation

## 2024-06-05 DIAGNOSIS — J449 Chronic obstructive pulmonary disease, unspecified: Secondary | ICD-10-CM | POA: Insufficient documentation

## 2024-06-05 LAB — CBC
HCT: 41.6 % (ref 36.0–46.0)
Hemoglobin: 12.9 g/dL (ref 12.0–15.0)
MCH: 30.6 pg (ref 26.0–34.0)
MCHC: 31 g/dL (ref 30.0–36.0)
MCV: 98.8 fL (ref 80.0–100.0)
Platelets: 206 K/uL (ref 150–400)
RBC: 4.21 MIL/uL (ref 3.87–5.11)
RDW: 15.6 % — ABNORMAL HIGH (ref 11.5–15.5)
WBC: 4.1 K/uL (ref 4.0–10.5)
nRBC: 0 % (ref 0.0–0.2)

## 2024-06-05 LAB — BRAIN NATRIURETIC PEPTIDE: B Natriuretic Peptide: 61.4 pg/mL (ref 0.0–100.0)

## 2024-06-05 LAB — BASIC METABOLIC PANEL WITH GFR
Anion gap: 10 (ref 5–15)
BUN: 36 mg/dL — ABNORMAL HIGH (ref 8–23)
CO2: 33 mmol/L — ABNORMAL HIGH (ref 22–32)
Calcium: 9.7 mg/dL (ref 8.9–10.3)
Chloride: 96 mmol/L — ABNORMAL LOW (ref 98–111)
Creatinine, Ser: 1.26 mg/dL — ABNORMAL HIGH (ref 0.44–1.00)
GFR, Estimated: 42 mL/min — ABNORMAL LOW (ref >60)
Glucose, Bld: 130 mg/dL — ABNORMAL HIGH (ref 70–99)
Potassium: 4.7 mmol/L (ref 3.5–5.1)
Sodium: 139 mmol/L (ref 135–145)

## 2024-06-05 MED ORDER — DAPAGLIFLOZIN PROPANEDIOL 10 MG PO TABS: 10.0000 mg | ORAL_TABLET | Freq: Every day | ORAL | 3 refills | Status: DC

## 2024-06-05 MED ORDER — ATORVASTATIN CALCIUM 80 MG PO TABS: 80.0000 mg | ORAL_TABLET | Freq: Every day | ORAL | 3 refills | Status: DC

## 2024-06-05 MED ORDER — APIXABAN 2.5 MG PO TABS: 2.5000 mg | ORAL_TABLET | Freq: Two times a day (BID) | ORAL | 11 refills | Status: DC

## 2024-06-05 MED ORDER — AMLODIPINE BESYLATE 10 MG PO TABS: 10.0000 mg | ORAL_TABLET | Freq: Every day | ORAL | 3 refills | Status: DC

## 2024-06-05 NOTE — Progress Notes (Signed)
   06/05/2024  Patient ID: Beth [name], female   DOB: Oct 03, 1941, 83 y.o.   MRN: [id number]  Called and left message on [name]'s voicemail about setting up an appointment to the office for medication review. Unfortunately, she did not answer the phone. HIPAA compliant message was left on her voicemail.   Plan: Call Patient back in 2 weeks.  Cassius DOROTHA Brought, PharmD, BCACP Clinical Pharmacist [phone]

## 2024-06-05 NOTE — Progress Notes (Signed)
 Date:  06/05/2024   ID:  Beth [name], DOB June 15, 1941, MRN [id number]    Provider location:  [facility] Type of Visit: Established patient  PCP:  Petrina Pries, NP  Cardiologist:  Dr. Rolan  Chief complaint: CHF   History of Present Illness: Beth [name] is a 83 y.o. female who has a history of COPD on home oxygen, untreated OSA, chronic diastolic CHF/RV failure, and pulmonary hypertension.  She was admitted in 5/17 with acute on chronic diastolic CHF with prominent RV failure and marked shortness of breath.  She was diuresed with IV Lasix .  Echo showed dilated and dysfunctional RV with pulmonary hypertension.  RHC showed marked pulmonary hypertension with systemic PA pressure.  She was started on Revatio  in the hospital and this was titrated up.  Subsequently, she was started on macitentan .  She next started Selexipag  but she had intractable side effects with selexipag  so stopped it.  I then tried her on Tyvaso , but she has developed abdominal discomfort and diarrhea with Tyvaso  and has had to stop it (symptomatic even at lowest dose). She did not feel like it helped her breathing when she was on it.  She stopped sildenafil  and started riociguat . However, she developed painful hand swelling on riociguat  and has had to stop it.  Symptoms resolved off riociguat .     She was admitted in 8/18 with hypotension and AKI.  Valsartan  and HCTZ stopped.      Echo in 11/19 showed EF 60-65%, mild AS, normal RV size and systolic function.    Echo in 12/20 showed EF 65-70%, LV mid-cavity gradient to 65 mmHg, mild AS with mean gradient 14 mmHg, normal RV size and systolic function, IVC normal, no TR jet. Echo in 1/22 showed EF 65-70%, mild LVH, mid-cavity LV gradient peak 27 mmHg, normal RV size and systolic function, normal IVC, PASP 18 mmHg, mild AS.   Echo 5/24 showed EF 70-75%, mid-cavity gradient to 32 mmHg with valsalva, normal RV, unable to estimate PA systolic pressure,  IVC normal, mild AS mean gradient 12 mmHg, mild MR.   She had an ER visit in 6/25 and was noted to be in atrial fibrillation.  She now has a Kardia device, she says this reads atrial fibrillation at times.   Today she returns for pulmonary hypertension follow up. She is in NSR today. She does not feel palpitations.  Weight is down 8 lbs.  BP is elevated today, she has taken her meds.  No lightheadedness.  Breathing is stable.  She is short of breath if she walks fast or walks more than about 100 feet.  No orthopnea/PND.  No chest pain.  She continues to use 2L home oxygen.   ECG (personally reviewed): NSR, RBBB, PACs   6 minute walk (7/17): 238 m 6 minute walk (10/17): 171 m 6 minute walk (3/18): 231 m 6 minute walk (11/18): 123 m 6 minute walk (1/19): 213 m 6 minute walk (10/19): 231 m 6 minute walk (9/20): 122 m 6 minute walk (11/20): 213 m 6 minute walk (3/21): 182 m 6 minute walk (9/21): 243 m 6 minute walk (4/22): 213 m 6 minute walk (12/22): 213 m 6 minute walk (3/23): 244 m 6 minute walk (5/24): 244 m 6 minute walk (7/25): 244 m   Labs (5/24): LDL 86 Labs (9/24): K 5.5, creatinine 1.29 Labs (6/25): K 4.2, creatinine 1.11   PMH: 1. H/o CVA 2. DM 3. HTN 4. Hyperlipidemia  5. OSA: Cannot tolerate CPAP, has tried multiple masks.  6. COPD: She is on home oxygen. 7. SVT 8. Chronic diastolic CHF with RV failure: Echo (5/17) with EF 55%, severely dilated RV with moderately decreased systolic function, D-shaped interventricular septum.  - Echo (7/18): EF 60-65%, D-shaped interventricular septum, mild RV dilation with normal systolic function, PASP 41, IVC normal, mild MR, mild AS.  - Echo (11/19): EF 60-65%, mild AS, normal RV size and systolic function.  - Echo (12/20): EF 65-70%, LV mid-cavity gradient to 65 mmHg, mild AS with mean gradient 14 mmHg, normal RV size and systolic function, IVC normal, no TR jet.  - Echo (1/22): EF 65-70%, mild LVH, mid-cavity LV gradient peak 27  mmHg, normal RV size and systolic function, normal IVC, PASP 18 mmHg, mild AS.   - Echo (5/24): EF 70-75%, mid-cavity gradient to 32 mmHg with valsalva, normal RV, unable to estimate PA systolic pressure, IVC normal, mild AS mean gradient 12 mmHg, mild MR.  9. 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  V/Q scan 5/17 with no evidence for chronic PE.  ANA weakly positive (1:80), ANCA negative, RF negative.  RHC (5/17) with mean RA 20, PA 110/45 mean 66, mean PCWP 20, CI 1.7, PVR 15 WU.  - Intractable side effects with selexipag .  - Did not tolerate Tyvaso  - Did not tolerate riociguat .  10. CKD 11. Aortic stenosis: Mild.  12. Atrial fibrillation: Paroxysmal, first noted in 6/25.   Current Outpatient Medications  Medication Sig Dispense Refill   albuterol  (VENTOLIN  HFA) 108 (90 Base) MCG/ACT inhaler INHALE 1-2 PUFFS BY MOUTH EVERY 6 HOURS AS NEEDED FOR WHEEZE OR SHORTNESS OF BREATH 18 each 3   apixaban  (ELIQUIS ) 2.5 MG TABS tablet Take 1 tablet (2.5 mg total) by mouth 2 (two) times daily. 60 tablet 11   bisoprolol  (ZEBETA ) 10 MG tablet TAKE 1 TABLET BY MOUTH EVERY DAY 90 tablet 3   clobetasol cream (TEMOVATE) 0.05 % Apply 1 application topically as needed.      ezetimibe  (ZETIA ) 10 MG tablet TAKE 1 TABLET BY MOUTH EVERY DAY 90 tablet 0   Fluticasone -Umeclidin-Vilant (TRELEGY ELLIPTA ) 100-62.5-25 MCG/ACT AEPB Inhale 1 puff into the lungs daily. 60 each 11   furosemide  (LASIX ) 40 MG tablet TAKE 2 TABLETS BY MOUTH EVERY MORNING. CAN TAKE EXTRA DOSE EVERY OTHER AFTERNOON WITH WEIGHT GAIN. 270 tablet 3   macitentan  (OPSUMIT ) 10 MG tablet TAKE 1 TABLET (10MG ) BY MOUTH DAILY. 90 tablet 3   OXYGEN Inhale 2 L into the lungs continuous.     potassium chloride  SA (KLOR-CON  M20) 20 MEQ tablet Take 1 tablet (20 mEq total) by mouth 3 (three) times daily.     sildenafil  (REVATIO ) 20 MG tablet TAKE 4 TABLETS (80 MG  TOTAL) BY MOUTH 3 (THREE) TIMES DAILY. 160 tablet 0   amLODipine  (NORVASC ) 10 MG tablet Take 1 tablet (10 mg total) by mouth daily. TAKE 1 TABLET BY MOUTH EVERYDAY AT BEDTIME 90 tablet 3   atorvastatin  (LIPITOR) 80 MG tablet Take 1 tablet (80 mg total) by mouth daily. 90 tablet 3   dapagliflozin  propanediol (FARXIGA ) 10 MG TABS tablet Take 1 tablet (10 mg total) by mouth daily. 90 tablet 3   No current facility-administered medications for this encounter.    Allergies:   Aldomet [methyldopa] and Tyvaso  [treprostinil ]   Social History:  The patient  reports that she quit smoking about  19 years ago. Her smoking use included cigarettes. She started smoking about 59 years ago. She has a 60 pack-year smoking history. She has quit using smokeless tobacco. She reports that she does not drink alcohol and does not use drugs.   Family History:  The patient's family history includes Heart disease in her brother, brother, mother, sister, and sister; Heart failure in her father and mother.   ROS:  Please see the history of present illness.   All other systems are personally reviewed and negative.   Recent Labs: 05/13/2024: ALT 15 06/05/2024: B Natriuretic Peptide 61.4; BUN 36; Creatinine, Ser 1.26; Hemoglobin 12.9; Platelets 206; Potassium 4.7; Sodium 139  Personally reviewed   Wt Readings from Last 3 Encounters:  06/05/24 47.2 kg (104 lb)  05/16/24 48.9 kg (107 lb 12.8 oz)  05/13/24 49.9 kg (110 lb 0.2 oz)    BP (!) 180/82   Pulse 73   Ht 5' 2 (1.575 m)   Wt 47.2 kg (104 lb)   SpO2 93%   BMI 19.02 kg/m   Exam:   General: Thin, frail Neck: JVP 7-8, no thyromegaly or thyroid  nodule.  Lungs: Distant breath sounds CV: Nondisplaced PMI.  Heart regular S1/S2, no S3/S4, 2/6 early SEM RUSB.  1+ ankle edema.  No carotid bruit.  Normal pedal pulses.  Abdomen: Soft, nontender, no hepatosplenomegaly, no distention.  Skin: Intact without lesions or rashes.  Neurologic: Alert and oriented x 3.  Psych:  Normal affect. Extremities: No clubbing or cyanosis.  HEENT: Normal.   Assessment/Plan:  1. 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  She was very short of breath with exertion and had very elevated right-sided filling pressures on 5/17 RHC.  PA pressure was systemic. Cardiac output low, but not moving towards IV Flolan given the mixed etiology of her PH.  V/Q scan not suggestive of chronic PEs and autoimmune serologies sent (RF negative, ANCA negative, ANA only weakly positive (1:80). She has been unable to tolerate selexipag , Tyvaso , or riociguat . Echo in 1/22 showed normal RV size and systolic function and estimation of PA pressure, surprisingly, was not significantly elevated. Echo 5/24 showed normal RV size and systolic function, unable to estimate PA systolic pressure.   Stable NYHA class II-III symptoms, 6 minute walk today was stable.  She is no more than minimally volume overloaded on exam. 6 minute walk today was stable.  - Continue sildenafil  80 mg tid.  - Continue Opsumit  10 mg daily.  - Check BNP.  - She was unable to Tyvaso , selexipag , or riociguat .  - I will arrange for repeat echo.  If RV looks worse or PA pressure remains significantly elevated by noninvasive evaluation, would consider trial of sotatercept.  2. COPD: On home oxygen.  Prior long-time smoker. No change.   3. Chronic diastolic CHF with prominent RV failure: Echo in 1/22 with RV function improved, appears normal.  Echo 5/24 showed EF 70-75%, mid-cavity gradient to 32 mmHg with valsalva, normal RV, unable to estimate PA systolic pressure, IVC normal, mild AS mean gradient 12 mmHg, mild MR.  NYHA class II-III, no more than minimal volume overload.   - Continue Lasix  80 mg daily + 60 KCL daily.  BMET today.  - Continue bisoprolol  10 mg daily (beta-1 selective beta blocker with COPD) given mid-cavity gradient.   - Increase  Farxiga  to 10 mg daily.  4. SVT: Denies palpitations.  5. OSA: Has  been unable to tolerate CPAP.  She uses home oxygen.  OSA may have small contribution to [facility] but does not explain the extent of PAH.  6. Hyperlipidemia - Restart atorvastatin , not sure why she stopped it. Check lipids/LFTs in 2 months.  7. HTN: BP remains elevated.  - Increase amlodipine  to 10 mg daily.  8. Aortic stenosis: Mild on most recent echo.   - I will be getting repeat echo.  9. Atrial fibrillation: Paroxysmal.  She is in NSR today.  She does not feel palpitations.  She has been in AF occasionally by her Kardia device.  She wore a 2 wk Zio monitor but result is not back yet.   - Stop ASA and start apixaban  2.5 mg bid.  - Can consider anti-arrhythmic if AF burden is high, but would like to avoid amiodarone given baseline lung disease.   Followup in 3 months with APP.   I spent 41 minutes reviewing records, interviewing/examining patient, and managing orders.   Signed, Ezra Shuck, MD  06/05/2024  [facility] [address] [facility] [place] [address] [phone] (office) [phone] (fax)

## 2024-06-05 NOTE — Patient Instructions (Signed)
 INCREASE Farixga 10 mg daily.  INCREASE Amlodipine  to 10 mg daily.  RESTART Atorvastatin  80 mg daily   STOP Asprin  START Eliquis  2.5 mg Twice daily  Labs done today, your results will be available in MyChart, we will contact you for abnormal readings.  REPEAT blood work in 2 months  Your physician has requested that you have an echocardiogram. Echocardiography is a painless test that uses sound waves to create images of your heart. It provides your doctor with information about the size and shape of your heart and how well your heart's chambers and valves are working. This procedure takes approximately one hour. There are no restrictions for this procedure. Please do NOT wear cologne, perfume, aftershave, or lotions (deodorant is allowed). Please arrive 15 minutes prior to your appointment time.  Please note: We ask at that you not bring children with you during ultrasound (echo/ vascular) testing. Due to room size and safety concerns, children are not allowed in the ultrasound rooms during exams. Our front office staff cannot provide observation of children in our lobby area while testing is being conducted. An adult accompanying a patient to their appointment will only be allowed in the ultrasound room at the discretion of the ultrasound technician under special circumstances. We apologize for any inconvenience.  Your physician recommends that you schedule a follow-up appointment in: 3 months.  If you have any questions or concerns before your next appointment please send us  a message through [place] or call our office at [phone].    TO LEAVE A MESSAGE FOR THE NURSE SELECT OPTION 2, PLEASE LEAVE A MESSAGE INCLUDING: YOUR NAME DATE OF BIRTH CALL BACK NUMBER REASON FOR CALL**this is important as we prioritize the call backs  YOU WILL RECEIVE A CALL BACK THE SAME DAY AS LONG AS YOU CALL BEFORE 4:00 PM  At the [facility], you and your health needs are our  priority. As part of our continuing mission to provide you with exceptional heart care, we have created designated Provider Care Teams. These Care Teams include your primary Cardiologist (physician) and Advanced Practice Providers (APPs- Physician Assistants and Nurse Practitioners) who all work together to provide you with the care you need, when you need it.   You may see any of the following providers on your designated Care Team at your next follow up: Dr Toribio Fuel Dr Ezra Shuck Dr. Ria Commander Dr. Morene Brownie Amy Lenetta, NP Caffie Shed, [place] [facility] [place], [place] Beckey Coe, NP [place] Lee, NP Ellouise Class, NP Tinnie Redman, PharmD Jaun Bash, PharmD   Please be sure to bring in all your medications bottles to every appointment.    Thank you for choosing [facility]

## 2024-06-05 NOTE — Progress Notes (Signed)
 Complex Care Management Note Care Guide Note  06/05/2024 Name: Beth [name] MRN: [id number] DOB: 1941/09/04   Complex Care Management Outreach Attempts: A second unsuccessful outreach was attempted today to offer the patient with information about available complex care management services.  Follow Up Plan:  Additional outreach attempts will be made to offer the patient complex care management information and services.   Encounter Outcome:  No Answer  [facility] HealthPopulation Health Care Guide  Direct Dial:[phone] [phone] Website: [email or web address]

## 2024-06-05 NOTE — Progress Notes (Signed)
 6 Min Walk Test Completed  Pt ambulated 800 ft (243.8 m) O2 Sat ranged 97%-88% on 2-3 L oxygen HR ranged 63-88

## 2024-06-06 ENCOUNTER — Telehealth: Payer: Self-pay | Admitting: *Deleted

## 2024-06-06 NOTE — Progress Notes (Signed)
 Complex Care Management Note Care Guide Note  06/06/2024 Name: Beth [name] MRN: [id number] DOB: 09-Jul-1941  Beth [name] is a 83 y.o. year old female who is a primary care patient of Petrina Pries, NP . The community resource team was consulted for assistance with Food Insecurity and Social   SDOH screenings and interventions completed:  Yes     SDOH Interventions Today    Flowsheet Row Most Recent Value  SDOH Interventions   Food Insecurity Interventions Community Resources Provided  [patient refered to community nutrition program  and meals on wheels]  Social Connections Interventions Community Resources Provided  [Senior resources material provided]     Care guide performed the following interventions: Patient provided with information about care guide support team and interviewed to confirm resource needs.  Follow Up Plan:  No further follow up planned at this time. The patient has been provided with needed resources.  Encounter Outcome:  Patient Visit Completed Darlis Wragg Greenauer-Moran  [facility] HealthPopulation Health Care Guide  Direct Dial:[phone] Fax:[phone] Website: [email or web address]

## 2024-06-06 NOTE — Addendum Note (Signed)
 Encounter addended by: Franchot Glade RAMAN, RN on: 06/06/2024 8:51 AM  Actions taken: Imaging Exam ended

## 2024-06-09 ENCOUNTER — Other Ambulatory Visit: Payer: Self-pay | Admitting: Licensed Clinical Social Worker

## 2024-06-09 NOTE — Patient Outreach (Signed)
 Complex Care Management   Visit Note  06/09/2024  Name:  Beth [name] MRN: [id number] DOB: 09-24-1941  Situation: Referral received for Complex Care Management related to SDOH Barriers:  Medicaid for PCS services I obtained verbal consent from Patient.  Visit completed with Patient and daughter Beth [name]  on the phone  Background:   Past Medical History:  Diagnosis Date   Arthritis    Asthma    CHF (congestive heart failure) (HCC)    CKD (chronic kidney disease)    COPD (chronic obstructive pulmonary disease) (HCC)    dR. wERT   Diabetes mellitus (HCC)    type 2   Gout    Hyperlipidemia    Hypertension    Shortness of breath    WITH EXERTION    Sleep apnea    USES  CPAP    Stroke [facility])     Assessment: Patient lives alone and her daughter Beth [name] comes by to check in on her, the patient and daughter were on the call today and patient want to apply for Medicaid again for [name] services, SW will make a referral to the Medicaid walk in clinic and the patient and daughter will call [employer] to see if the patients plan will cover PCS services.    SDOH Interventions    Flowsheet Row Patient Outreach Telephone from 06/09/2024 in [place] [facility] HEALTH DEPARTMENT Telephone from 06/06/2024 in [place] POPULATION HEALTH DEPARTMENT Patient Outreach Telephone from 05/30/2024 in  POPULATION HEALTH DEPARTMENT Clinical Support from 09/08/2023 in [facility] Telephone from 04/07/2023 in [facility]  SDOH Interventions       Food Insecurity Interventions Intervention Not Indicated  [Patient stated that she is ok with food currently] Community Resources Provided  [patient refered to community nutrition program  and meals on wheels] Intervention Not Indicated Intervention Not Indicated --  Housing Interventions Intervention Not Indicated -- Intervention Not Indicated Intervention Not Indicated --   Transportation Interventions Intervention Not Indicated -- Intervention Not Indicated Intervention Not Indicated Payor Benefit  Utilities Interventions Intervention Not Indicated -- --  [patient would like resources to help pay utility bill] Intervention Not Indicated Other (Comment)  [family assisted pt but also referred for patient care fund]  Alcohol Usage Interventions -- -- -- Intervention Not Indicated (Score <7) --  Financial Strain Interventions Intervention Not Indicated -- -- Intervention Not Indicated Other (Comment)  [discussed patient care fund]  Physical Activity Interventions -- -- -- Other (Comments)  [walks around the house] --  Stress Interventions -- -- -- Intervention Not Indicated --  Social Connections Interventions -- Programmer, applications Provided  [employer] resources material provided] -- Intervention Not Indicated --  Health Literacy Interventions -- -- -- Intervention Not Indicated --      Recommendation:   none  Follow Up Plan:   Telephone follow up appointment date/time:  06/23/2024 at 10:00 am  Tobias CHARM Maranda HEDWIG, PhD [facility], [facility] Social Worker Direct Dial: [phone]  Fax: [phone]

## 2024-06-09 NOTE — Patient Instructions (Signed)
 Visit Information  Thank you for taking time to visit with me today. Please don't hesitate to contact me if I can be of assistance to you before our next scheduled appointment.  Our next appointment is by telephone on 06/23/2024 at 10:00 am Please call the care guide team at [phone] if you need to cancel or reschedule your appointment.   Following is a copy of your care plan:   Goals Addressed             This Visit's Progress    BSW VBCI Social Work Care Plan       Problems:   Wants to apply for Medicaid again after being denied last year, patient wants PCS  CSW Clinical Goal(s):   Over the next 2 weeks the Patient will will follow up with [facility] as directed by Social Work Will make a referral to the [employer] in clinic.  Interventions:  SW will make referral to the walk in clinic  Patient Goals/Self-Care Activities:  Call [employer] to follow up on caregiver resources and support.  Plan:   Telephone follow up appointment with care management team member scheduled for:  06/23/5024 at 10:00 am        Please call the Suicide and Crisis Lifeline: 988 go to [facility] [address], [place] [phone]) call 911 if you are experiencing a Mental Health or Behavioral Health Crisis or need someone to talk to.  Patient verbalizes understanding of instructions and care plan provided today and agrees to view in MyChart. Active MyChart status and patient understanding of how to access instructions and care plan via MyChart confirmed with patient.      Tobias CHARM Maranda HEDWIG, PhD [facility], [facility] Social Worker Direct Dial: [phone]  Fax: [phone]

## 2024-06-13 ENCOUNTER — Ambulatory Visit (HOSPITAL_COMMUNITY): Payer: Self-pay | Admitting: Internal Medicine

## 2024-06-14 ENCOUNTER — Telehealth: Payer: Self-pay | Admitting: Pharmacist

## 2024-06-14 DIAGNOSIS — N1832 Chronic kidney disease, stage 3b: Secondary | ICD-10-CM

## 2024-06-14 NOTE — Progress Notes (Signed)
   06/14/2024  Patient ID: Beth [name], female   DOB: October 21, 1941, 83 y.o.   MRN: [id number]  Called [name] to get her scheduled for a comprehensive medication review and an evaluation for medication adherence packaging.  HIPAA identifiers were obtained.  Due to school starting, Beth [name] requested to meet a the end of August.  I will have her scheduled for Wednesday July 12, 2024 at 3:30pm per request.  Beth [name], PharmD, [facility] Clinical Pharmacist [phone]

## 2024-06-23 ENCOUNTER — Other Ambulatory Visit: Payer: Self-pay | Admitting: Licensed Clinical Social Worker

## 2024-06-23 NOTE — Patient Outreach (Signed)
 Complex Care Management   Visit Note  06/23/2024  Name:  Beth [name] MRN: [id number] DOB: [birth year]  Situation: Referral received for Complex Care Management related to Medicaid application for PCS services I obtained verbal consent from Patient.  Visit completed with patient  on the phone  Background:   Past Medical History:  Diagnosis Date   Arthritis    Asthma    CHF (congestive heart failure) (HCC)    CKD (chronic kidney disease)    COPD (chronic obstructive pulmonary disease) (HCC)    dR. wERT   Diabetes mellitus (HCC)    type 2   Gout    Hyperlipidemia    Hypertension    Shortness of breath    WITH EXERTION    Sleep apnea    USES  CPAP    Stroke [facility])     Assessment: Patient stated that she and her daughter have not followed on on the Medicaid paperwork and the call to [employer] but will do it as soon as possible with her daughter   SDOH Interventions    Flowsheet Row Patient Outreach Telephone from 06/09/2024 in [place] POPULATION HEALTH DEPARTMENT Telephone from 06/06/2024 in [place] POPULATION HEALTH DEPARTMENT Patient Outreach Telephone from 05/30/2024 in [place] POPULATION HEALTH DEPARTMENT Clinical Support from 09/08/2023 in [facility] Telephone from 04/07/2023 in [facility]  SDOH Interventions       Food Insecurity Interventions Intervention Not Indicated  [Patient stated that she is ok with food currently] Community Resources Provided  [patient refered to community nutrition program  and meals on wheels] Intervention Not Indicated Intervention Not Indicated --  Housing Interventions Intervention Not Indicated -- Intervention Not Indicated Intervention Not Indicated --  Transportation Interventions Intervention Not Indicated -- Intervention Not Indicated Intervention Not Indicated Payor Benefit  Utilities Interventions Intervention Not Indicated -- --  [patient  would like resources to help pay utility bill] Intervention Not Indicated Other (Comment)  [family assisted pt but also referred for patient care fund]  Alcohol Usage Interventions -- -- -- Intervention Not Indicated (Score <7) --  Financial Strain Interventions Intervention Not Indicated -- -- Intervention Not Indicated Other (Comment)  [discussed patient care fund]  Physical Activity Interventions -- -- -- Other (Comments)  [walks around the house] --  Stress Interventions -- -- -- Intervention Not Indicated --  Social Connections Interventions -- Programmer, applications Provided  [employer] resources material provided] -- Intervention Not Indicated --  Health Literacy Interventions -- -- -- Intervention Not Indicated --      Recommendation:   none  Follow Up Plan:   Telephone follow up appointment date/time:  06/29/2024 at 10:00 am  Tobias CHARM Maranda HEDWIG, PhD [facility], [facility] Social Worker Direct Dial: [phone]  Fax: [phone]

## 2024-06-23 NOTE — Patient Instructions (Signed)
 Visit Information  Thank you for taking time to visit with me today. Please don't hesitate to contact me if I can be of assistance to you before our next scheduled appointment.  Your next care management appointment is by telephone on 06/29/2024 at 10:00 am   Please call the care guide team at [phone] if you need to cancel, schedule, or reschedule an appointment.   Please call the Suicide and Crisis Lifeline: 988 go to [facility] Urgent [facility] [address] Center [phone]) call 911 if you are experiencing a Mental Health or Behavioral Health Crisis or need someone to talk to.  Tobias CHARM Maranda HEDWIG, PhD [facility], [place] Social Worker Direct Dial: [phone]  Fax: [phone]

## 2024-06-27 ENCOUNTER — Other Ambulatory Visit (HOSPITAL_COMMUNITY): Payer: Self-pay | Admitting: Cardiology

## 2024-06-28 ENCOUNTER — Telehealth: Payer: Self-pay

## 2024-06-29 ENCOUNTER — Other Ambulatory Visit: Payer: Self-pay | Admitting: Licensed Clinical Social Worker

## 2024-06-30 ENCOUNTER — Ambulatory Visit (HOSPITAL_COMMUNITY)
Admission: RE | Admit: 2024-06-30 | Discharge: 2024-06-30 | Disposition: A | Discharge status: Home / Self Care | Source: Ambulatory Visit | Attending: Cardiology | Admitting: Cardiology

## 2024-06-30 DIAGNOSIS — E785 Hyperlipidemia, unspecified: Secondary | ICD-10-CM | POA: Insufficient documentation

## 2024-06-30 DIAGNOSIS — I272 Pulmonary hypertension, unspecified: Secondary | ICD-10-CM | POA: Insufficient documentation

## 2024-06-30 DIAGNOSIS — I509 Heart failure, unspecified: Secondary | ICD-10-CM | POA: Insufficient documentation

## 2024-06-30 DIAGNOSIS — I130 Hypertensive heart and chronic kidney disease with heart failure and stage 1 through stage 4 chronic kidney disease, or unspecified chronic kidney disease: Secondary | ICD-10-CM | POA: Diagnosis not present

## 2024-06-30 DIAGNOSIS — I340 Nonrheumatic mitral (valve) insufficiency: Secondary | ICD-10-CM | POA: Insufficient documentation

## 2024-06-30 DIAGNOSIS — N189 Chronic kidney disease, unspecified: Secondary | ICD-10-CM | POA: Diagnosis not present

## 2024-06-30 DIAGNOSIS — J449 Chronic obstructive pulmonary disease, unspecified: Secondary | ICD-10-CM | POA: Insufficient documentation

## 2024-06-30 DIAGNOSIS — I350 Nonrheumatic aortic (valve) stenosis: Secondary | ICD-10-CM | POA: Insufficient documentation

## 2024-06-30 DIAGNOSIS — Z8673 Personal history of transient ischemic attack (TIA), and cerebral infarction without residual deficits: Secondary | ICD-10-CM | POA: Diagnosis not present

## 2024-06-30 LAB — ECHOCARDIOGRAM COMPLETE
AR max vel: 1.51 cm2
AV Area VTI: 1.57 cm2
AV Area mean vel: 1.58 cm2
AV Mean grad: 15 mmHg
AV Peak grad: 29.6 mmHg
Ao pk vel: 2.72 m/s
Area-P 1/2: 2.44 cm2
Calc EF: 77.9 %
S' Lateral: 2 cm
Single Plane A2C EF: 79.7 %
Single Plane A4C EF: 75.3 %

## 2024-06-30 NOTE — Patient Instructions (Signed)
 Visit Information  Thank you for taking time to visit with me today. Please don't hesitate to contact me if I can be of assistance to you before our next scheduled appointment.  Your next care management appointment is by telephone on 07/13/2024 at 11:15 am    Please call the care guide team at [phone] if you need to cancel, schedule, or reschedule an appointment.   Please call the Suicide and Crisis Lifeline: 988 go to [facility] Urgent [facility] [address], [place] [phone]) call 911 if you are experiencing a Mental Health or Behavioral Health Crisis or need someone to talk to.  Tobias CHARM Maranda HEDWIG, PhD [facility], [facility] Social Worker Direct Dial: [phone]  Fax: [phone]

## 2024-06-30 NOTE — Patient Outreach (Signed)
 Complex Care Management   Visit Note  06/29/2024  Name:  Beth [name] MRN: [id number] DOB: 1940-12-24  Situation: Referral received for Complex Care Management related to SDOH Barriers:  Medicaid Application for PCS services I obtained verbal consent from Patient.  Visit completed with patient and [name]  on the phone  Background:   Past Medical History:  Diagnosis Date   Arthritis    Asthma    CHF (congestive heart failure) (HCC)    CKD (chronic kidney disease)    COPD (chronic obstructive pulmonary disease) (HCC)    dR. wERT   Diabetes mellitus (HCC)    type 2   Gout    Hyperlipidemia    Hypertension    Shortness of breath    WITH EXERTION    Sleep apnea    USES  CPAP    Stroke [facility])     Assessment: Patient and daughter called [facility] and was told that [facility] services were not covered in the patients plan and can change. SW did make another referral to the Medicaid walk in clinic   SDOH Interventions    Flowsheet Row Patient Outreach Telephone from 06/09/2024 in [place] POPULATION HEALTH DEPARTMENT Telephone from 06/06/2024 in [place] POPULATION HEALTH DEPARTMENT Patient Outreach Telephone from 05/30/2024 in [place] POPULATION HEALTH DEPARTMENT Clinical Support from 09/08/2023 in [facility] Telephone from 04/07/2023 in [facility]  SDOH Interventions       Food Insecurity Interventions Intervention Not Indicated  [Patient stated that she is ok with food currently] Community Resources Provided  [patient refered to community nutrition program  and meals on wheels] Intervention Not Indicated Intervention Not Indicated --  Housing Interventions Intervention Not Indicated -- Intervention Not Indicated Intervention Not Indicated --  Transportation Interventions Intervention Not Indicated -- Intervention Not Indicated Intervention Not Indicated Payor Benefit  Utilities Interventions  Intervention Not Indicated -- --  [patient would like resources to help pay utility bill] Intervention Not Indicated Other (Comment)  [family assisted pt but also referred for patient care fund]  Alcohol Usage Interventions -- -- -- Intervention Not Indicated (Score <7) --  Financial Strain Interventions Intervention Not Indicated -- -- Intervention Not Indicated Other (Comment)  [discussed patient care fund]  Physical Activity Interventions -- -- -- Other (Comments)  [walks around the house] --  Stress Interventions -- -- -- Intervention Not Indicated --  Social Connections Interventions -- Programmer, applications Provided  [employer] resources material provided] -- Intervention Not Indicated --  Health Literacy Interventions -- -- -- Intervention Not Indicated --    Recommendation:   none  Follow Up Plan:   Telephone follow up appointment date/time:  07/13/2024 at 11:15  Tobias CHARM Maranda HEDWIG, PhD [facility], [facility] Social Worker Direct Dial: [phone]  Fax: [phone]

## 2024-07-05 ENCOUNTER — Telehealth (HOSPITAL_COMMUNITY): Payer: Self-pay | Admitting: *Deleted

## 2024-07-05 ENCOUNTER — Other Ambulatory Visit: Payer: Self-pay | Admitting: Family Medicine

## 2024-07-05 NOTE — Telephone Encounter (Signed)
 Called patient per Dr. Rolan with following echo results:   Vigorous LV function, mild RV dysfunction.   Patient verbalized understanding of same. No further questions at this time.

## 2024-07-11 ENCOUNTER — Telehealth: Payer: Self-pay

## 2024-07-12 ENCOUNTER — Telehealth: Payer: Self-pay | Admitting: Pharmacist

## 2024-07-13 ENCOUNTER — Telehealth: Payer: Self-pay

## 2024-07-13 ENCOUNTER — Inpatient Hospital Stay (HOSPITAL_COMMUNITY)
Admission: EM | Admit: 2024-07-13 | Discharge: 2024-07-17 | DRG: 189 | Disposition: E | Attending: Internal Medicine | Admitting: Internal Medicine

## 2024-07-13 ENCOUNTER — Other Ambulatory Visit: Payer: Self-pay | Admitting: Licensed Clinical Social Worker

## 2024-07-13 ENCOUNTER — Other Ambulatory Visit: Payer: Self-pay

## 2024-07-13 ENCOUNTER — Emergency Department (HOSPITAL_COMMUNITY)

## 2024-07-13 ENCOUNTER — Encounter (HOSPITAL_COMMUNITY): Payer: Self-pay

## 2024-07-13 DIAGNOSIS — R579 Shock, unspecified: Secondary | ICD-10-CM | POA: Diagnosis not present

## 2024-07-13 DIAGNOSIS — M109 Gout, unspecified: Secondary | ICD-10-CM | POA: Diagnosis present

## 2024-07-13 DIAGNOSIS — I5030 Unspecified diastolic (congestive) heart failure: Secondary | ICD-10-CM | POA: Diagnosis not present

## 2024-07-13 DIAGNOSIS — Z87891 Personal history of nicotine dependence: Secondary | ICD-10-CM

## 2024-07-13 DIAGNOSIS — I4510 Unspecified right bundle-branch block: Secondary | ICD-10-CM | POA: Diagnosis present

## 2024-07-13 DIAGNOSIS — D72829 Elevated white blood cell count, unspecified: Secondary | ICD-10-CM | POA: Diagnosis present

## 2024-07-13 DIAGNOSIS — N189 Chronic kidney disease, unspecified: Secondary | ICD-10-CM | POA: Insufficient documentation

## 2024-07-13 DIAGNOSIS — E872 Acidosis, unspecified: Secondary | ICD-10-CM | POA: Insufficient documentation

## 2024-07-13 DIAGNOSIS — N179 Acute kidney failure, unspecified: Secondary | ICD-10-CM | POA: Diagnosis not present

## 2024-07-13 DIAGNOSIS — I471 Supraventricular tachycardia, unspecified: Secondary | ICD-10-CM | POA: Diagnosis present

## 2024-07-13 DIAGNOSIS — I130 Hypertensive heart and chronic kidney disease with heart failure and stage 1 through stage 4 chronic kidney disease, or unspecified chronic kidney disease: Secondary | ICD-10-CM | POA: Diagnosis present

## 2024-07-13 DIAGNOSIS — Z888 Allergy status to other drugs, medicaments and biological substances status: Secondary | ICD-10-CM

## 2024-07-13 DIAGNOSIS — R0902 Hypoxemia: Secondary | ICD-10-CM | POA: Diagnosis not present

## 2024-07-13 DIAGNOSIS — J439 Emphysema, unspecified: Secondary | ICD-10-CM | POA: Diagnosis present

## 2024-07-13 DIAGNOSIS — Z9842 Cataract extraction status, left eye: Secondary | ICD-10-CM

## 2024-07-13 DIAGNOSIS — Z9981 Dependence on supplemental oxygen: Secondary | ICD-10-CM

## 2024-07-13 DIAGNOSIS — G4733 Obstructive sleep apnea (adult) (pediatric): Secondary | ICD-10-CM | POA: Diagnosis present

## 2024-07-13 DIAGNOSIS — Z7189 Other specified counseling: Secondary | ICD-10-CM

## 2024-07-13 DIAGNOSIS — Z7951 Long term (current) use of inhaled steroids: Secondary | ICD-10-CM

## 2024-07-13 DIAGNOSIS — Z9889 Other specified postprocedural states: Secondary | ICD-10-CM

## 2024-07-13 DIAGNOSIS — I700 Atherosclerosis of aorta: Secondary | ICD-10-CM | POA: Diagnosis not present

## 2024-07-13 DIAGNOSIS — Z1152 Encounter for screening for COVID-19: Secondary | ICD-10-CM

## 2024-07-13 DIAGNOSIS — E8729 Other acidosis: Secondary | ICD-10-CM | POA: Diagnosis not present

## 2024-07-13 DIAGNOSIS — Z79899 Other long term (current) drug therapy: Secondary | ICD-10-CM

## 2024-07-13 DIAGNOSIS — Z8249 Family history of ischemic heart disease and other diseases of the circulatory system: Secondary | ICD-10-CM

## 2024-07-13 DIAGNOSIS — J9690 Respiratory failure, unspecified, unspecified whether with hypoxia or hypercapnia: Secondary | ICD-10-CM | POA: Diagnosis not present

## 2024-07-13 DIAGNOSIS — J449 Chronic obstructive pulmonary disease, unspecified: Secondary | ICD-10-CM | POA: Diagnosis present

## 2024-07-13 DIAGNOSIS — Z515 Encounter for palliative care: Secondary | ICD-10-CM

## 2024-07-13 DIAGNOSIS — I272 Pulmonary hypertension, unspecified: Secondary | ICD-10-CM | POA: Diagnosis present

## 2024-07-13 DIAGNOSIS — G9349 Other encephalopathy: Secondary | ICD-10-CM | POA: Diagnosis not present

## 2024-07-13 DIAGNOSIS — R918 Other nonspecific abnormal finding of lung field: Secondary | ICD-10-CM | POA: Diagnosis not present

## 2024-07-13 DIAGNOSIS — I358 Other nonrheumatic aortic valve disorders: Secondary | ICD-10-CM | POA: Diagnosis present

## 2024-07-13 DIAGNOSIS — I350 Nonrheumatic aortic (valve) stenosis: Secondary | ICD-10-CM | POA: Diagnosis not present

## 2024-07-13 DIAGNOSIS — Z961 Presence of intraocular lens: Secondary | ICD-10-CM | POA: Diagnosis present

## 2024-07-13 DIAGNOSIS — J9622 Acute and chronic respiratory failure with hypercapnia: Secondary | ICD-10-CM | POA: Diagnosis present

## 2024-07-13 DIAGNOSIS — J9621 Acute and chronic respiratory failure with hypoxia: Secondary | ICD-10-CM | POA: Diagnosis not present

## 2024-07-13 DIAGNOSIS — E861 Hypovolemia: Secondary | ICD-10-CM | POA: Diagnosis present

## 2024-07-13 DIAGNOSIS — I480 Paroxysmal atrial fibrillation: Secondary | ICD-10-CM | POA: Diagnosis present

## 2024-07-13 DIAGNOSIS — J441 Chronic obstructive pulmonary disease with (acute) exacerbation: Secondary | ICD-10-CM | POA: Diagnosis present

## 2024-07-13 DIAGNOSIS — E860 Dehydration: Secondary | ICD-10-CM | POA: Diagnosis present

## 2024-07-13 DIAGNOSIS — Z66 Do not resuscitate: Secondary | ICD-10-CM | POA: Diagnosis not present

## 2024-07-13 DIAGNOSIS — R0602 Shortness of breath: Secondary | ICD-10-CM | POA: Diagnosis not present

## 2024-07-13 DIAGNOSIS — R54 Age-related physical debility: Secondary | ICD-10-CM | POA: Diagnosis present

## 2024-07-13 DIAGNOSIS — I4891 Unspecified atrial fibrillation: Secondary | ICD-10-CM

## 2024-07-13 DIAGNOSIS — Z9841 Cataract extraction status, right eye: Secondary | ICD-10-CM

## 2024-07-13 DIAGNOSIS — N1832 Chronic kidney disease, stage 3b: Secondary | ICD-10-CM | POA: Diagnosis not present

## 2024-07-13 DIAGNOSIS — Z7984 Long term (current) use of oral hypoglycemic drugs: Secondary | ICD-10-CM

## 2024-07-13 DIAGNOSIS — Z9851 Tubal ligation status: Secondary | ICD-10-CM

## 2024-07-13 DIAGNOSIS — J440 Chronic obstructive pulmonary disease with acute lower respiratory infection: Secondary | ICD-10-CM | POA: Diagnosis present

## 2024-07-13 DIAGNOSIS — J189 Pneumonia, unspecified organism: Secondary | ICD-10-CM | POA: Diagnosis not present

## 2024-07-13 DIAGNOSIS — Z8673 Personal history of transient ischemic attack (TIA), and cerebral infarction without residual deficits: Secondary | ICD-10-CM

## 2024-07-13 DIAGNOSIS — E1122 Type 2 diabetes mellitus with diabetic chronic kidney disease: Secondary | ICD-10-CM | POA: Diagnosis present

## 2024-07-13 DIAGNOSIS — I2721 Secondary pulmonary arterial hypertension: Secondary | ICD-10-CM | POA: Diagnosis present

## 2024-07-13 DIAGNOSIS — E119 Type 2 diabetes mellitus without complications: Secondary | ICD-10-CM

## 2024-07-13 DIAGNOSIS — I5032 Chronic diastolic (congestive) heart failure: Secondary | ICD-10-CM | POA: Diagnosis present

## 2024-07-13 DIAGNOSIS — G9340 Encephalopathy, unspecified: Secondary | ICD-10-CM | POA: Diagnosis not present

## 2024-07-13 DIAGNOSIS — I517 Cardiomegaly: Secondary | ICD-10-CM | POA: Diagnosis not present

## 2024-07-13 DIAGNOSIS — J90 Pleural effusion, not elsewhere classified: Secondary | ICD-10-CM | POA: Diagnosis not present

## 2024-07-13 DIAGNOSIS — I10 Essential (primary) hypertension: Secondary | ICD-10-CM | POA: Diagnosis present

## 2024-07-13 DIAGNOSIS — E785 Hyperlipidemia, unspecified: Secondary | ICD-10-CM | POA: Diagnosis present

## 2024-07-13 DIAGNOSIS — J811 Chronic pulmonary edema: Secondary | ICD-10-CM | POA: Diagnosis not present

## 2024-07-13 DIAGNOSIS — I468 Cardiac arrest due to other underlying condition: Secondary | ICD-10-CM | POA: Diagnosis not present

## 2024-07-13 DIAGNOSIS — M1990 Unspecified osteoarthritis, unspecified site: Secondary | ICD-10-CM | POA: Diagnosis present

## 2024-07-13 DIAGNOSIS — Z7901 Long term (current) use of anticoagulants: Secondary | ICD-10-CM

## 2024-07-13 LAB — CBC
HCT: 42.6 % (ref 36.0–46.0)
Hemoglobin: 13.6 g/dL (ref 12.0–15.0)
MCH: 30.8 pg (ref 26.0–34.0)
MCHC: 31.9 g/dL (ref 30.0–36.0)
MCV: 96.4 fL (ref 80.0–100.0)
Platelets: 221 K/uL (ref 150–400)
RBC: 4.42 MIL/uL (ref 3.87–5.11)
RDW: 15.9 % — ABNORMAL HIGH (ref 11.5–15.5)
WBC: 14.1 K/uL — ABNORMAL HIGH (ref 4.0–10.5)
nRBC: 0 % (ref 0.0–0.2)

## 2024-07-13 LAB — MAGNESIUM: Magnesium: 2.3 mg/dL (ref 1.7–2.4)

## 2024-07-13 LAB — HEPATIC FUNCTION PANEL
ALT: 28 U/L (ref 0–44)
AST: 49 U/L — ABNORMAL HIGH (ref 15–41)
Albumin: 2.8 g/dL — ABNORMAL LOW (ref 3.5–5.0)
Alkaline Phosphatase: 51 U/L (ref 38–126)
Bilirubin, Direct: 0.4 mg/dL — ABNORMAL HIGH (ref 0.0–0.2)
Indirect Bilirubin: 1.1 mg/dL — ABNORMAL HIGH (ref 0.3–0.9)
Total Bilirubin: 1.5 mg/dL — ABNORMAL HIGH (ref 0.0–1.2)
Total Protein: 7.2 g/dL (ref 6.5–8.1)

## 2024-07-13 LAB — URINALYSIS, ROUTINE W REFLEX MICROSCOPIC
Bilirubin Urine: NEGATIVE
Glucose, UA: NEGATIVE mg/dL
Ketones, ur: NEGATIVE mg/dL
Leukocytes,Ua: NEGATIVE
Nitrite: NEGATIVE
Protein, ur: 100 mg/dL — AB
Specific Gravity, Urine: 1.01 (ref 1.005–1.030)
pH: 5 (ref 5.0–8.0)

## 2024-07-13 LAB — BASIC METABOLIC PANEL WITH GFR
Anion gap: 18 — ABNORMAL HIGH (ref 5–15)
BUN: 64 mg/dL — ABNORMAL HIGH (ref 8–23)
CO2: 25 mmol/L (ref 22–32)
Calcium: 8.7 mg/dL — ABNORMAL LOW (ref 8.9–10.3)
Chloride: 94 mmol/L — ABNORMAL LOW (ref 98–111)
Creatinine, Ser: 2.12 mg/dL — ABNORMAL HIGH (ref 0.44–1.00)
GFR, Estimated: 23 mL/min — ABNORMAL LOW (ref >60)
Glucose, Bld: 111 mg/dL — ABNORMAL HIGH (ref 70–99)
Potassium: 5.2 mmol/L — ABNORMAL HIGH (ref 3.5–5.1)
Sodium: 137 mmol/L (ref 135–145)

## 2024-07-13 LAB — RESP PANEL BY RT-PCR (RSV, FLU A&B, COVID)  RVPGX2
Influenza A by PCR: NEGATIVE
Influenza B by PCR: NEGATIVE
Resp Syncytial Virus by PCR: NEGATIVE
SARS Coronavirus 2 by RT PCR: NEGATIVE

## 2024-07-13 LAB — BRAIN NATRIURETIC PEPTIDE: B Natriuretic Peptide: 388.9 pg/mL — ABNORMAL HIGH (ref 0.0–100.0)

## 2024-07-13 LAB — TROPONIN I (HIGH SENSITIVITY)
Troponin I (High Sensitivity): 89 ng/L — ABNORMAL HIGH (ref <18)
Troponin I (High Sensitivity): 103 ng/L (ref <18)

## 2024-07-13 MED ORDER — DILTIAZEM HCL-DEXTROSE 125-5 MG/125ML-% IV SOLN (PREMIX)
5.0000 mg/h | INTRAVENOUS | Status: DC
  Administered 2024-07-13: 5 mg/h via INTRAVENOUS
  Administered 2024-07-14: 10 mg/h via INTRAVENOUS
  Filled 2024-07-13 (×2): qty 125

## 2024-07-13 MED ORDER — APIXABAN 2.5 MG PO TABS
2.5000 mg | ORAL_TABLET | Freq: Two times a day (BID) | ORAL | Status: DC
  Administered 2024-07-13: 2.5 mg via ORAL
  Filled 2024-07-13: qty 1

## 2024-07-13 MED ORDER — ACETAMINOPHEN 325 MG PO TABS: 650.0000 mg | ORAL_TABLET | Freq: Four times a day (QID) | ORAL | Status: DC | PRN

## 2024-07-13 MED ORDER — SENNOSIDES-DOCUSATE SODIUM 8.6-50 MG PO TABS: 1.0000 | ORAL_TABLET | Freq: Every evening | ORAL | Status: DC | PRN

## 2024-07-13 MED ORDER — ONDANSETRON HCL 4 MG PO TABS: 4.0000 mg | ORAL_TABLET | Freq: Four times a day (QID) | ORAL | Status: DC | PRN

## 2024-07-13 MED ORDER — LACTATED RINGERS IV SOLN: INTRAVENOUS | Status: AC

## 2024-07-13 MED ORDER — BUDESON-GLYCOPYRROL-FORMOTEROL 160-9-4.8 MCG/ACT IN AERO
2.0000 | INHALATION_SPRAY | Freq: Two times a day (BID) | RESPIRATORY_TRACT | Status: DC
  Filled 2024-07-13: qty 5.9

## 2024-07-13 MED ORDER — SODIUM CHLORIDE 0.9 % IV BOLUS
500.0000 mL | Freq: Once | INTRAVENOUS | Status: AC
  Administered 2024-07-13: 500 mL via INTRAVENOUS

## 2024-07-13 MED ORDER — DILTIAZEM HCL 25 MG/5ML IV SOLN
10.0000 mg | Freq: Once | INTRAVENOUS | Status: AC
  Administered 2024-07-13: 10 mg via INTRAVENOUS
  Filled 2024-07-13: qty 5

## 2024-07-13 MED ORDER — SODIUM CHLORIDE 0.9% FLUSH: 3.0000 mL | Freq: Two times a day (BID) | INTRAVENOUS | Status: DC

## 2024-07-13 MED ORDER — IPRATROPIUM-ALBUTEROL 0.5-2.5 (3) MG/3ML IN SOLN
3.0000 mL | Freq: Four times a day (QID) | RESPIRATORY_TRACT | Status: DC | PRN
Start: 2024-07-13 — End: 2024-07-14
  Administered 2024-07-14: 3 mL via RESPIRATORY_TRACT
  Filled 2024-07-13: qty 3

## 2024-07-13 MED ORDER — FUROSEMIDE 10 MG/ML IJ SOLN
40.0000 mg | Freq: Once | INTRAMUSCULAR | Status: AC
  Administered 2024-07-13: 40 mg via INTRAVENOUS
  Filled 2024-07-13: qty 4

## 2024-07-13 MED ORDER — ATORVASTATIN CALCIUM 80 MG PO TABS: 80.0000 mg | ORAL_TABLET | Freq: Every day | ORAL | Status: DC

## 2024-07-13 MED ORDER — ALBUTEROL SULFATE (2.5 MG/3ML) 0.083% IN NEBU
2.5000 mg | INHALATION_SOLUTION | Freq: Once | RESPIRATORY_TRACT | Status: AC
  Administered 2024-07-13: 2.5 mg via RESPIRATORY_TRACT
  Filled 2024-07-13: qty 3

## 2024-07-13 MED ORDER — ONDANSETRON HCL 4 MG/2ML IJ SOLN
4.0000 mg | Freq: Four times a day (QID) | INTRAMUSCULAR | Status: DC | PRN
Start: 2024-07-13 — End: 2024-07-15

## 2024-07-13 MED ORDER — BISOPROLOL FUMARATE 10 MG PO TABS
10.0000 mg | ORAL_TABLET | Freq: Every day | ORAL | Status: DC
  Filled 2024-07-13: qty 1

## 2024-07-13 MED ORDER — ACETAMINOPHEN 650 MG RE SUPP
650.0000 mg | Freq: Four times a day (QID) | RECTAL | Status: DC | PRN
Start: 2024-07-13 — End: 2024-07-15

## 2024-07-13 MED ORDER — EZETIMIBE 10 MG PO TABS: 10.0000 mg | ORAL_TABLET | Freq: Every day | ORAL | Status: DC

## 2024-07-13 MED ORDER — SILDENAFIL CITRATE 20 MG PO TABS
80.0000 mg | ORAL_TABLET | Freq: Three times a day (TID) | ORAL | Status: DC
  Administered 2024-07-14: 80 mg via ORAL
  Filled 2024-07-13 (×4): qty 4

## 2024-07-13 NOTE — Hospital Course (Signed)
 Beth [name] is a 83 y.o. female with medical history significant for chronic HFpEF, pulmonary hypertension, PAF on Eliquis , RBBB, COPD, chronic respiratory failure with hypoxia on 2 L [place], CKD stage IIIb, T2DM, HTN, HLD, OSA intolerant to CPAP who is admitted with A-fib with RVR.

## 2024-07-13 NOTE — Progress Notes (Signed)
 Care Guide Pharmacy Note  07/13/2024 Name: Beth [name] MRN: [id number] DOB: [birth year]  Referred By: Court Dorn PARAS, MD Reason for referral: Complex Care Management and Call Attempt #1 (Successful initial outreach to schedule with PHARM D- Per Cassius NOVAK)   Beth [name] is a 83 y.o. year old female who is a primary care patient of Court Dorn PARAS, MD.  Beth [name] was referred to the pharmacist for assistance related to: medication assistance  Successful contact was made with the patient to discuss pharmacy services including being ready for the pharmacist to call at least 5 minutes before the scheduled appointment time and to have medication bottles and any blood pressure readings ready for review. The patient agreed to meet with the pharmacist via in person visit on (date/time).07/19/24 @ 4:15 pm.  Leotis Cloria Davene Salome Romell Ralph Valrie, [facility] Guide  Direct Dial: [phone]  Fax [phone]

## 2024-07-13 NOTE — ED Triage Notes (Signed)
 Patient here for [facility], per daughter we believe x 4 days, patient breathing 44 times a minute, on 2L O2 at baseline, currently satting 86%

## 2024-07-13 NOTE — H&P (Addendum)
 History and Physical    Beth [name]:[id number] DOB: 08-Nov-1941 DOA: 07/13/2024  PCP: Petrina Pries, NP  Patient coming from: Home  I have personally briefly reviewed patient's old medical records in [facility]  Chief Complaint: Shortness of breath  HPI: Beth [name] is a 83 y.o. female with medical history significant for chronic HFpEF, pulmonary hypertension, PAF on Eliquis , RBBB, COPD, chronic respiratory failure with hypoxia on 2 L [place], CKD stage IIIb, T2DM, HTN, HLD, OSA intolerant to CPAP who presented to the ED for evaluation of shortness of breath.  Patient states that she has chronic shortness of breath at baseline which has been an ongoing issue for 3 years.  Over the last 1-1/2 weeks she has been having worsening dyspnea compared to her baseline.  She had a little bit of nonproductive cough today.  She has not had any chest pain, nausea, vomiting.  Family states that she had some swelling in her legs over the weekend which has since resolved.  She reports fair urine output.  She has been feeling dehydrated.  She says that she has been using her 2 L O2 via [place] 24/7.  She says that she does not use a CPAP at nighttime due to intolerance.  ED Course  Labs/Imaging on admission: I have personally reviewed following labs and imaging studies.  Initial vitals showed BP 150/85, pulse 100, RR 44, temp 97.9 F, SpO2 87% on 2 L O2 via [place].  Patient placed on 4 L O2 with SpO2 improved to 90-100%.  Labs showed WBC 14.1, hemoglobin 13.6, platelets 221, sodium 137, potassium 5.2 (hemolysis), bicarb 25, BUN 64, creatinine 2.12, serum glucose 111, magnesium  2.3, BNP 388.9, troponin 89 > 103.  SARS-CoV-2, influenza, RSV PCR negative.  UA pending collection.  2 view chest x-ray showed cardiomegaly with prominent central pulmonary vessels.  Emphysema.  No acute airspace disease.  Patient was given 500 cc normal saline, IV Lasix  40 mg, IV diltiazem  10 mg followed by continuous infusion.   The hospitalist service was consulted for admission.  Review of Systems: All systems reviewed and are negative except as documented in history of present illness above.   Past Medical History:  Diagnosis Date   Arthritis    Asthma    CHF (congestive heart failure) (HCC)    CKD (chronic kidney disease)    COPD (chronic obstructive pulmonary disease) (HCC)    dR. wERT   Diabetes mellitus (HCC)    type 2   Gout    Hyperlipidemia    Hypertension    Shortness of breath    WITH EXERTION    Sleep apnea    USES  CPAP    Stroke [facility])     Past Surgical History:  Procedure Laterality Date   CARDIAC CATHETERIZATION N/A 04/02/2016   Procedure: Right Heart Cath;  Surgeon: Ezra GORMAN Shuck, MD;  Location: [facility] INVASIVE CV LAB;  Service: Cardiovascular;  Laterality: N/A;   CATARACT EXTRACTION W/PHACO Right 04/19/2013   Procedure: CATARACT EXTRACTION PHACO AND INTRAOCULAR LENS PLACEMENT (IOC);  Surgeon: Jestine Bunnell, MD;  Location: [facility] OR;  Service: Ophthalmology;  Laterality: Right;   CATARACT EXTRACTION W/PHACO Left 05/03/2013   Procedure: CATARACT EXTRACTION PHACO AND INTRAOCULAR LENS PLACEMENT (IOC);  Surgeon: Jestine Bunnell, MD;  Location: [facility] OR;  Service: Ophthalmology;  Laterality: Left;   EYE SURGERY     TUBAL LIGATION      Social History: Social History   Tobacco Use   Smoking status: Former  Current packs/day: 0.00    Average packs/day: 1.5 packs/day for 40.0 years (60.0 ttl pk-yrs)    Types: Cigarettes    Start date: 11/16/1964    Quit date: 11/16/2004    Years since quitting: 19.6   Smokeless tobacco: Former  Building services engineer status: Never Used  Substance Use Topics   Alcohol use: No    Comment: occassional   Drug use: No   Allergies  Allergen Reactions   Aldomet [Methyldopa] Hives   Tyvaso  [Treprostinil ] Diarrhea    Family History  Problem Relation Age of Onset   Heart disease Mother    Heart failure Mother    Heart disease Brother    Heart disease Brother     Heart disease Sister    Heart disease Sister    Heart failure Father      Prior to Admission medications   Medication Sig Start Date End Date Taking? Authorizing Provider  albuterol  (VENTOLIN  HFA) 108 (90 Base) MCG/ACT inhaler INHALE 1-2 PUFFS BY MOUTH EVERY 6 HOURS AS NEEDED FOR WHEEZE OR SHORTNESS OF BREATH 05/23/24   Parrett, Tammy S, NP  amLODipine  (NORVASC ) 10 MG tablet Take 1 tablet (10 mg total) by mouth daily. TAKE 1 TABLET BY MOUTH EVERYDAY AT BEDTIME 06/05/24   McLean, Dalton S, MD  apixaban  (ELIQUIS ) 2.5 MG TABS tablet Take 1 tablet (2.5 mg total) by mouth 2 (two) times daily. 06/05/24   Rolan Ezra RAMAN, MD  atorvastatin  (LIPITOR) 80 MG tablet Take 1 tablet (80 mg total) by mouth daily. 06/05/24   Rolan Ezra RAMAN, MD  bisoprolol  (ZEBETA ) 10 MG tablet TAKE 1 TABLET BY MOUTH EVERY DAY 05/17/24   McLean, Dalton S, MD  clobetasol cream (TEMOVATE) 0.05 % Apply 1 application topically as needed.  12/31/19   [provider]  dapagliflozin  propanediol (FARXIGA ) 10 MG TABS tablet Take 1 tablet (10 mg total) by mouth daily. 06/05/24   Rolan Ezra RAMAN, MD  ezetimibe  (ZETIA ) 10 MG tablet TAKE 1 TABLET BY MOUTH EVERY DAY 06/27/24   McLean, Dalton S, MD  Fluticasone -Umeclidin-Vilant (TRELEGY ELLIPTA ) 100-62.5-25 MCG/ACT AEPB Inhale 1 puff into the lungs daily. 07/30/23   Parrett, Madelin RAMAN, NP  furosemide  (LASIX ) 40 MG tablet TAKE 2 TABLETS BY MOUTH EVERY MORNING. CAN TAKE EXTRA DOSE EVERY OTHER AFTERNOON WITH WEIGHT GAIN. 11/30/23   Rolan Ezra RAMAN, MD  macitentan  (OPSUMIT ) 10 MG tablet TAKE 1 TABLET (10MG ) BY MOUTH DAILY. 05/07/23   Bensimhon, Toribio SAUNDERS, MD  OXYGEN Inhale 2 L into the lungs continuous.    [provider]  potassium chloride  SA (KLOR-CON  M20) 20 MEQ tablet Take 1 tablet (20 mEq total) by mouth 3 (three) times daily. 05/16/24   Terra Fairy PARAS, PA-C  sildenafil  (REVATIO ) 20 MG tablet TAKE 4 TABLETS (80 MG TOTAL) BY MOUTH 3 (THREE) TIMES DAILY. 05/15/24   Rolan Ezra RAMAN, MD     Physical Exam: Vitals:   07/13/24 2145 07/13/24 2215 07/13/24 2230 07/13/24 2245  BP: (!) 151/71 (!) 145/69 136/61 (!) 119/104  Pulse: 100 (!) 110 (!) 105 (!) 101  Resp: (!) 39 (!) 36  (!) 37  Temp:      TempSrc:      SpO2: 97% 90% 96% 95%  Weight:      Height:       Constitutional: Thin elderly woman resting in bed with head elevated.  NAD, calm, comfortable Eyes: EOMI, lids and conjunctivae normal ENMT: Mucous membranes and lips are dry/cracked. Posterior pharynx clear  of any exudate or lesions.Normal dentition.  Neck: normal, supple, no masses. Respiratory: clear to auscultation bilaterally, no wheezing, no crackles. Normal respiratory effort while on 4 L O2 [place]. No accessory muscle use.  Cardiovascular: Regular rate and rhythm, no murmurs / rubs / gallops. No extremity edema. 2+ pedal pulses. Abdomen: no tenderness, no masses palpated. Musculoskeletal: no clubbing / cyanosis. No joint deformity upper and lower extremities. Good ROM, no contractures. Normal muscle tone.  Skin: no rashes, lesions, ulcers. No induration Neurologic: Sensation intact. Strength 5/5 in all 4.  Psychiatric: Normal judgment and insight. Alert and oriented x 3. Normal mood.   EKG: Personally reviewed.  Atrial fibrillation with RVR, rate 158, RBBB.  Previous EKG showed sinus arrhythmia, rate 69, RBBB.  Assessment/Plan Principal Problem:   Paroxysmal atrial fibrillation with rapid ventricular response (HCC) Active Problems:   COPD  GOLD III/ 02 dep    Chronic heart failure with preserved ejection fraction (HFpEF, >= 50%) (HCC)   Essential hypertension   Dyslipidemia   Acute kidney injury superimposed on chronic kidney disease (HCC)   Acute on chronic respiratory failure with hypoxia (HCC)   Hyperlipidemia   Type 2 diabetes mellitus without complication (HCC)   Beth [name] is a 83 y.o. female with medical history significant for chronic HFpEF, pulmonary hypertension, PAF on Eliquis , RBBB, COPD,  chronic respiratory failure with hypoxia on 2 L [place], CKD stage IIIb, T2DM, HTN, HLD, OSA intolerant to CPAP who is admitted with A-fib with RVR.  Assessment and Plan: Paroxysmal atrial fibrillation with RVR: Patient presenting with A-fib RVR.  She has been started on IV diltiazem  infusion with improving heart rates.  Suspect elevated troponin is demand ischemia. - Continue IV diltiazem  drip - Continue Eliquis  2.5 mg twice daily - Resume bisoprolol  10 mg daily tomorrow  Acute kidney injury superimposed on CKD stage IIIb: Creatinine 2.12 on admission compared to baseline ~1.2.  Overall she appears hypovolemic on admission.  She was given both IV Lasix  and 500 cc IV fluids in the ED. - Start gentle IV fluid with LR@75  mL/hour x 10 hours - Hold Lasix , Farxiga  for now - Monitor UOP, obtain urine studies  Chronic HFpEF: As above, patient appears hypovolemic on admission.  TTE 06/30/2024 showed EF 70-75%, mild RV dysfunction.  Improvement in RV dysfunction and pulmonary pressures noted. - Holding Lasix  and Farxiga  as above - Receiving gentle IV fluids overnight - Continue bisoprolol   Acute on chronic hypoxic respiratory failure: SpO2 was 86% while on home 2 L [place], improved on 3.5-4 L  at time of admission.  Likely multifactorial, primarily related to A-fib RVR as well as chronic component of CHF, pulmonary HTN, and COPD.  Continue management as above and wean supplemental O2 as able.  COPD: No significant wheezing noted on admission.  Continue Trelegy Ellipta , DuoNebs as needed.  Pulmonary hypertension: Recent TTE 06/30/2024 showed improvement in RV dysfunction. - Continue sildenafil  - Patient is not taking Opsumit  per med rec  Hypertension: Resume bisoprolol  tomorrow.  Holding amlodipine  while on IV diltiazem  drip.  Type 2 diabetes: Well-controlled with last hemoglobin A1c 6.1%.  Holding Farxiga  as above.  Hyperlipidemia: Continue atorvastatin  and Zetia .  OSA: Not using CPAP due to  intolerance.  Continue supplemental oxygen 24/7.   DVT prophylaxis: apixaban  (ELIQUIS ) tablet 2.5 mg Start: 07/13/24 2315 apixaban  (ELIQUIS ) tablet 2.5 mg  Code Status: Full code, confirmed with patient on admission Family Communication: Sister, son, and daughter-in-law at bedside Disposition Plan: From home, dispo pending clinical progress  Consults called: None Severity of Illness: The appropriate patient status for this patient is INPATIENT. Inpatient status is judged to be reasonable and necessary in order to provide the required intensity of service to ensure the patient's safety. The patient's presenting symptoms, physical exam findings, and initial radiographic and laboratory data in the context of their chronic comorbidities is felt to place them at high risk for further clinical deterioration. Furthermore, it is not anticipated that the patient will be medically stable for discharge from the hospital within 2 midnights of admission.   * I certify that at the point of admission it is my clinical judgment that the patient will require inpatient hospital care spanning beyond 2 midnights from the point of admission due to high intensity of service, high risk for further deterioration and high frequency of surveillance required.DEWAINE Jorie Blanch MD [facility]  If 7PM-7AM, please contact night-coverage [email or web address]  07/13/2024, 11:13 PM

## 2024-07-13 NOTE — ED Notes (Signed)
 87% on baseline 2l o2. I increased o2 to 4L

## 2024-07-13 NOTE — ED Notes (Signed)
 ED TO INPATIENT HANDOFF REPORT  ED Nurse Name and Phone #: Eric OBIE Fairy RN [phone]  S Name/Age/Gender Beth [name] 83 y.o. female Room/Bed: 011C/011C  Code Status   Code Status: Prior  Home/SNF/Other Home Patient oriented to: self, place, time, and situation Is this baseline? Yes   Triage Complete: Triage complete  Chief Complaint Paroxysmal atrial fibrillation with rapid ventricular response (HCC) [I48.0]  Triage Note Patient here for [facility], per daughter we believe x 4 days, patient breathing 44 times a minute, on 2L O2 at baseline, currently satting 86%  Denies CP    Allergies Allergies  Allergen Reactions   Aldomet [Methyldopa] Hives   Tyvaso  [Treprostinil ] Diarrhea    Level of Care/Admitting Diagnosis ED Disposition     ED Disposition  Admit   Condition  --   Comment  Hospital [facility]: [facility] [[id number]]  Level of Care: Progressive [102]  Admit to Progressive based on following criteria: CARDIOVASCULAR & THORACIC of moderate stability with acute coronary syndrome symptoms/low risk myocardial infarction/hypertensive urgency/arrhythmias/heart failure potentially compromising stability and stable post cardiovascular intervention patients.  May admit patient to [facility] or [facility] if equivalent level of care is available:: No  Covid Evaluation: Asymptomatic - no recent exposure (last 10 days) testing not required  Diagnosis: Paroxysmal atrial fibrillation with rapid ventricular response [facility]) [[id number]]  Admitting Physician: TOBIE JORIE SAUNDERS [[id number]]  Attending Physician: TOBIE JORIE SAUNDERS [[id number]]  Certification:: I certify this patient will need inpatient services for at least 2 midnights  Expected Medical Readiness: 07/15/2024          B Medical/Surgery History Past Medical History:  Diagnosis Date   Arthritis    Asthma    CHF (congestive heart failure) (HCC)    CKD (chronic kidney disease)    COPD (chronic obstructive  pulmonary disease) (HCC)    dR. wERT   Diabetes mellitus (HCC)    type 2   Gout    Hyperlipidemia    Hypertension    Shortness of breath    WITH EXERTION    Sleep apnea    USES  CPAP    Stroke [facility])    Past Surgical History:  Procedure Laterality Date   CARDIAC CATHETERIZATION N/A 04/02/2016   Procedure: Right Heart Cath;  Surgeon: Ezra GORMAN Shuck, MD;  Location: [facility] INVASIVE CV LAB;  Service: Cardiovascular;  Laterality: N/A;   CATARACT EXTRACTION W/PHACO Right 04/19/2013   Procedure: CATARACT EXTRACTION PHACO AND INTRAOCULAR LENS PLACEMENT (IOC);  Surgeon: Jestine Bunnell, MD;  Location: [facility] OR;  Service: Ophthalmology;  Laterality: Right;   CATARACT EXTRACTION W/PHACO Left 05/03/2013   Procedure: CATARACT EXTRACTION PHACO AND INTRAOCULAR LENS PLACEMENT (IOC);  Surgeon: Jestine Bunnell, MD;  Location[facility] OR;  Service: Ophthalmology;  Laterality: Left;   EYE SURGERY     TUBAL LIGATION       A IV Location/Drains/Wounds Patient Lines/Drains/Airways Status     Active Line/Drains/Airways     Name Placement date Placement time Site Days   Peripheral IV 07/13/24 20 G Posterior;Right Forearm 07/13/24  1724  Forearm  less than 1   Peripheral IV 07/13/24 20 G Left Antecubital 07/13/24  2045  Antecubital  less than 1            Intake/Output Last 24 hours  Intake/Output Summary (Last 24 hours) at 07/13/2024 2253 Last data filed at 07/13/2024 2152 Gross per 24 hour  Intake 13.19 ml  Output --  Net 13.19 ml    Labs/Imaging  Results for orders placed or performed during the hospital encounter of 07/13/24 (from the past 48 hours)  Basic metabolic panel     Status: Abnormal   Collection Time: 07/13/24  5:14 PM  Result Value Ref Range   Sodium 137 135 - 145 mmol/L   Potassium 5.2 (H) 3.5 - 5.1 mmol/L    Comment: HEMOLYSIS AT THIS LEVEL MAY AFFECT RESULT   Chloride 94 (L) 98 - 111 mmol/L   CO2 25 22 - 32 mmol/L   Glucose, Bld 111 (H) 70 - 99 mg/dL    Comment: Glucose reference range  applies only to samples taken after fasting for at least 8 hours.   BUN 64 (H) 8 - 23 mg/dL   Creatinine, Ser 7.87 (H) 0.44 - 1.00 mg/dL   Calcium  8.7 (L) 8.9 - 10.3 mg/dL   GFR, Estimated 23 (L) >60 mL/min    Comment: (NOTE) Calculated using the CKD-EPI Creatinine Equation (2021)    Anion gap 18 (H) 5 - 15    Comment: Performed at [facility], [address], [place], [place] [address]  CBC     Status: Abnormal   Collection Time: 07/13/24  5:14 PM  Result Value Ref Range   WBC 14.1 (H) 4.0 - 10.5 K/uL   RBC 4.42 3.87 - 5.11 MIL/uL   Hemoglobin 13.6 12.0 - 15.0 g/dL   HCT 57.3 63.9 - 53.9 %   MCV 96.4 80.0 - 100.0 fL   MCH 30.8 26.0 - 34.0 pg   MCHC 31.9 30.0 - 36.0 g/dL   RDW 84.0 (H) 88.4 - 84.4 %   Platelets 221 150 - 400 K/uL   nRBC 0.0 0.0 - 0.2 %    Comment: Performed at [facility], [address] [place], [place] [address]  Hepatic function panel     Status: Abnormal   Collection Time: 07/13/24  5:14 PM  Result Value Ref Range   Total Protein 7.2 6.5 - 8.1 g/dL   Albumin  2.8 (L) 3.5 - 5.0 g/dL   AST 49 (H) 15 - 41 U/L    Comment: HEMOLYSIS AT THIS LEVEL MAY AFFECT RESULT   ALT 28 0 - 44 U/L    Comment: HEMOLYSIS AT THIS LEVEL MAY AFFECT RESULT   Alkaline Phosphatase 51 38 - 126 U/L   Total Bilirubin 1.5 (H) 0.0 - 1.2 mg/dL    Comment: HEMOLYSIS AT THIS LEVEL MAY AFFECT RESULT   Bilirubin, Direct 0.4 (H) 0.0 - 0.2 mg/dL   Indirect Bilirubin 1.1 (H) 0.3 - 0.9 mg/dL    Comment: Performed at [facility], [address], [place], [place] [address]  Magnesium      Status: None   Collection Time: 07/13/24  5:14 PM  Result Value Ref Range   Magnesium  2.3 1.7 - 2.4 mg/dL    Comment: Performed at [facility], [address], [place], [place] [address]  Troponin I (High Sensitivity)     Status: Abnormal   Collection Time: 07/13/24  5:14 PM  Result Value Ref Range   Troponin I (High Sensitivity) 89 (H) <18 ng/L    Comment: (NOTE) Elevated high  sensitivity troponin I (hsTnI) values and significant  changes across serial measurements may suggest ACS but many other  chronic and acute conditions are known to elevate hsTnI results.  Refer to the Links section for chest pain algorithms and additional  guidance. Performed at [facility], [address], [place], [place]  [address]   Brain natriuretic peptide     Status: Abnormal   Collection Time: 07/13/24  5:14 PM  Result Value Ref Range   B Natriuretic Peptide 388.9 (H) 0.0 - 100.0 pg/mL    Comment: Performed at [facility], [address], [place], [place] [address]  Resp panel by RT-PCR (RSV, Flu A&B, Covid) Anterior Nasal Swab     Status: None   Collection Time: 07/13/24  5:56 PM   Specimen: Anterior Nasal Swab  Result Value Ref Range   SARS Coronavirus 2 by RT PCR NEGATIVE NEGATIVE   Influenza A by PCR NEGATIVE NEGATIVE   Influenza B by PCR NEGATIVE NEGATIVE    Comment: (NOTE) The Xpert Xpress SARS-CoV-2/FLU/RSV plus assay is intended as an aid in the diagnosis of influenza from Nasopharyngeal swab specimens and should not be used as a sole basis for treatment. Nasal washings and aspirates are unacceptable for Xpert Xpress SARS-CoV-2/FLU/RSV testing.  Fact Sheet for Patients: BloggerCourse.com  Fact Sheet for Healthcare Providers: SeriousBroker.it  This test is not yet approved or cleared by the [place]  FDA and has been authorized for detection and/or diagnosis of SARS-CoV-2 by FDA under an Emergency Use Authorization (EUA). This EUA will remain in effect (meaning this test can be used) for the duration of the COVID-19 declaration under Section 564(b)(1) of the Act, 21 U.S.C. section 360bbb-3(b)(1), unless the authorization is terminated or revoked.     Resp Syncytial Virus by PCR NEGATIVE NEGATIVE    Comment: (NOTE) Fact Sheet for Patients: BloggerCourse.com  Fact  Sheet for Healthcare Providers: SeriousBroker.it  This test is not yet approved or cleared by the [place]  FDA and has been authorized for detection and/or diagnosis of SARS-CoV-2 by FDA under an Emergency Use Authorization (EUA). This EUA will remain in effect (meaning this test can be used) for the duration of the COVID-19 declaration under Section 564(b)(1) of the Act, 21 U.S.C. section 360bbb-3(b)(1), unless the authorization is terminated or revoked.  Performed at [facility], [address] [place], [place] [address]   Troponin I (High Sensitivity)     Status: Abnormal   Collection Time: 07/13/24  8:00 PM  Result Value Ref Range   Troponin I (High Sensitivity) 103 (HH) <18 ng/L    Comment: CRITICAL RESULT CALLED TO, READ BACK BY AND VERIFIED WITH ROLLINS, A. RN @2057  07/13/24 RS (NOTE) Elevated high sensitivity troponin I (hsTnI) values and significant  changes across serial measurements may suggest ACS but many other  chronic and acute conditions are known to elevate hsTnI results.  Refer to the Links section for chest pain algorithms and additional  guidance. Performed at [facility], [address] [place], [place] [address]   Urinalysis, Routine w reflex microscopic -Urine, Clean Catch     Status: Abnormal   Collection Time: 07/13/24 10:19 PM  Result Value Ref Range   Color, Urine YELLOW YELLOW   APPearance HAZY (A) CLEAR   Specific Gravity, Urine 1.010 1.005 - 1.030   pH 5.0 5.0 - 8.0   Glucose, UA NEGATIVE NEGATIVE mg/dL   Hgb urine dipstick MODERATE (A) NEGATIVE   Bilirubin Urine NEGATIVE NEGATIVE   Ketones, ur NEGATIVE NEGATIVE mg/dL   Protein, ur 899 (A) NEGATIVE mg/dL   Nitrite NEGATIVE NEGATIVE   Leukocytes,Ua NEGATIVE NEGATIVE   RBC / HPF 0-5 0 - 5 RBC/hpf   WBC, UA 0-5 0 - 5 WBC/hpf   Bacteria, UA RARE (A) NONE SEEN  Squamous Epithelial / HPF 0-5 0 - 5 /HPF   Mucus PRESENT    Hyaline Casts, UA PRESENT      Comment: Performed at [facility], [address], [place], [place] [address]   DG Chest 2 View Result Date: 07/13/2024 CLINICAL DATA:  Shortness of breath EXAM: CHEST - 2 VIEW COMPARISON:  05/13/2024 FINDINGS: No consolidation. Cardiomegaly with prominent central pulmonary vessels. Emphysema. Mild atelectasis at the left base. No pleural effusion or pneumothorax. Aortic atherosclerosis. IMPRESSION: Cardiomegaly with prominent central pulmonary vessels. Emphysema. No acute airspace disease Electronically Signed   By: Luke Bun M.D.   On: 07/13/2024 19:30    Pending Labs Unresulted Labs (From admission, onward)    None       Vitals/Pain Today's Vitals   07/13/24 2145 07/13/24 2215 07/13/24 2230 07/13/24 2245  BP: (!) 151/71 (!) 145/69 136/61 (!) 119/104  Pulse: 100 (!) 110 (!) 105 (!) 101  Resp: (!) 39 (!) 36  (!) 37  Temp:      TempSrc:      SpO2: 97% 90% 96% 95%  Weight:      Height:      PainSc:        Isolation Precautions No active isolations  Medications Medications  diltiazem  (CARDIZEM ) 125 mg in dextrose  5% 125 mL (1 mg/mL) infusion (15 mg/hr Intravenous Infusion Verify 07/13/24 2152)  diltiazem  (CARDIZEM ) injection 10 mg (10 mg Intravenous Given 07/13/24 1816)  furosemide  (LASIX ) injection 40 mg (40 mg Intravenous Given 07/13/24 2024)  sodium chloride  0.9 % bolus 500 mL (0 mLs Intravenous Stopped 07/13/24 2250)  albuterol  (PROVENTIL ) (2.5 MG/3ML) 0.083% nebulizer solution 2.5 mg (2.5 mg Nebulization Given 07/13/24 2144)    Mobility walks with person assist     Focused Assessments Cardiac Assessment Handoff:  Cardiac Rhythm: Atrial fibrillation No results found for: CKTOTAL, CKMB, CKMBINDEX, TROPONINI Lab Results  Component Value Date   DDIMER <0.27 04/01/2016   Does the Patient currently have chest pain? No   , Pulmonary Assessment Handoff:  Lung sounds: Bilateral Breath Sounds: Diminished, Coarse crackles O2 Device: Nasal  Cannula O2 Flow Rate (L/min): 4 L/min    R Recommendations: See Admitting Provider Note  Report given to:   Additional Notes:

## 2024-07-13 NOTE — ED Provider Notes (Signed)
 [place] EMERGENCY DEPARTMENT AT [facility] Provider Note   CSN: [id number] Arrival date & time: 07/13/24  1637     Patient presents with: Shortness of Breath   Beth [name] is a 83 y.o. female.   HPI   83 year old female with history of paroxysmal atrial fibrillation on anticoagulation (compliant), chronic diastolic CHF/RV failure, pulmonary hypertension, COPD on baseline 2 L presents to the emergency department accompanied by daughter with concern for worsening shortness of breath for the past 4 days.  Patient has been using her home oxygen as prescribed.  Otherwise is noted to be compliant with Eliquis  and Lasix .  Started developing some mild shortness of breath that has progressed to persistent tachypnea and increased oxygen requirement.  No noted cough or phlegm production.  Denies any fever/chills.  At 1 point felt like she had mild ankle swelling but otherwise denies any chest pain, back pain.  No hemoptysis.  Has never required any medication for rate control from an atrial fibrillation standpoint.  Prior to Admission medications   Medication Sig Start Date End Date Taking? Authorizing Provider  albuterol  (VENTOLIN  HFA) 108 (90 Base) MCG/ACT inhaler INHALE 1-2 PUFFS BY MOUTH EVERY 6 HOURS AS NEEDED FOR WHEEZE OR SHORTNESS OF BREATH 05/23/24   Parrett, Madelin RAMAN, NP  amLODipine  (NORVASC ) 10 MG tablet Take 1 tablet (10 mg total) by mouth daily. TAKE 1 TABLET BY MOUTH EVERYDAY AT BEDTIME 06/05/24   McLean, Dalton S, MD  apixaban  (ELIQUIS ) 2.5 MG TABS tablet Take 1 tablet (2.5 mg total) by mouth 2 (two) times daily. 06/05/24   Rolan Ezra RAMAN, MD  atorvastatin  (LIPITOR) 80 MG tablet Take 1 tablet (80 mg total) by mouth daily. 06/05/24   Rolan Ezra RAMAN, MD  bisoprolol  (ZEBETA ) 10 MG tablet TAKE 1 TABLET BY MOUTH EVERY DAY 05/17/24   McLean, Dalton S, MD  clobetasol cream (TEMOVATE) 0.05 % Apply 1 application topically as needed.  12/31/19   [provider]  dapagliflozin   propanediol (FARXIGA ) 10 MG TABS tablet Take 1 tablet (10 mg total) by mouth daily. 06/05/24   Rolan Ezra RAMAN, MD  ezetimibe  (ZETIA ) 10 MG tablet TAKE 1 TABLET BY MOUTH EVERY DAY 06/27/24   McLean, Dalton S, MD  Fluticasone -Umeclidin-Vilant (TRELEGY ELLIPTA ) 100-62.5-25 MCG/ACT AEPB Inhale 1 puff into the lungs daily. 07/30/23   Parrett, Madelin RAMAN, NP  furosemide  (LASIX ) 40 MG tablet TAKE 2 TABLETS BY MOUTH EVERY MORNING. CAN TAKE EXTRA DOSE EVERY OTHER AFTERNOON WITH WEIGHT GAIN. 11/30/23   Rolan Ezra RAMAN, MD  macitentan  (OPSUMIT ) 10 MG tablet TAKE 1 TABLET (10MG ) BY MOUTH DAILY. 05/07/23   Bensimhon, Toribio SAUNDERS, MD  OXYGEN Inhale 2 L into the lungs continuous.    [provider]  potassium chloride  SA (KLOR-CON  M20) 20 MEQ tablet Take 1 tablet (20 mEq total) by mouth 3 (three) times daily. 05/16/24   Terra Fairy PARAS, PA-C  sildenafil  (REVATIO ) 20 MG tablet TAKE 4 TABLETS (80 MG TOTAL) BY MOUTH 3 (THREE) TIMES DAILY. 05/15/24   Rolan Ezra RAMAN, MD    Allergies: Aldomet [methyldopa] and Tyvaso  [treprostinil ]    Review of Systems  Constitutional:  Positive for appetite change and fatigue. Negative for fever.  Respiratory:  Positive for shortness of breath. Negative for cough and chest tightness.   Cardiovascular:  Positive for leg swelling. Negative for chest pain and palpitations.  Gastrointestinal:  Negative for abdominal pain, diarrhea and vomiting.  Skin:  Negative for rash.  Neurological:  Negative for headaches.  Updated Vital Signs BP (!) 142/62   Pulse (!) 104   Temp 97.9 F (36.6 C) (Oral)   Resp (!) 42   Ht 5' 2 (1.575 m)   Wt 40.8 kg   SpO2 100%   BMI 16.46 kg/m   Physical Exam Vitals and nursing note reviewed.  Constitutional:      Appearance: Normal appearance. She is ill-appearing.  HENT:     Head: Normocephalic.     Mouth/Throat:     Mouth: Mucous membranes are moist.  Cardiovascular:     Rate and Rhythm: Tachycardia present. Rhythm irregular.   Pulmonary:     Effort: Tachypnea present.     Breath sounds: Examination of the right-lower field reveals decreased breath sounds. Examination of the left-lower field reveals decreased breath sounds. Decreased breath sounds and rales present.  Abdominal:     Palpations: Abdomen is soft.     Tenderness: There is no abdominal tenderness.  Musculoskeletal:     Right lower leg: No edema.     Left lower leg: No edema.  Skin:    General: Skin is warm.  Neurological:     Mental Status: She is alert and oriented to person, place, and time. Mental status is at baseline.  Psychiatric:        Mood and Affect: Mood normal.     (all labs ordered are listed, but only abnormal results are displayed) Labs Reviewed  CBC - Abnormal; Notable for the following components:      Result Value   WBC 14.1 (*)    RDW 15.9 (*)    All other components within normal limits  RESP PANEL BY RT-PCR (RSV, FLU A&B, COVID)  [id number]  BASIC METABOLIC PANEL WITH GFR  HEPATIC FUNCTION PANEL  MAGNESIUM   BRAIN NATRIURETIC PEPTIDE  TROPONIN I (HIGH SENSITIVITY)    EKG: EKG Interpretation Date/Time:  Thursday July 13 2024 17:15:10 EDT Ventricular Rate:  158 PR Interval:    QRS Duration:  127 QT Interval:  276 QTC Calculation: 449 R Axis:   89  Text Interpretation: Wide-QRS tachycardia 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   .Critical Care  Performed by: Bari Flank HERO, DO Authorized by: Bari Flank HERO, DO   Critical care provider statement:    Critical care time (minutes):  30   Critical care time was exclusive of:  Separately billable procedures and treating other patients   Critical care was necessary to treat or prevent imminent or life-threatening deterioration of the following conditions:  Respiratory failure, circulatory failure and dehydration   Critical care was time spent personally by me on  the following activities:  Development of treatment plan with patient or surrogate, discussions with consultants, evaluation of patient's response to treatment, examination of patient, ordering and review of laboratory studies, ordering and review of radiographic studies, ordering and performing treatments and interventions, pulse oximetry, re-evaluation of patient's condition and review of old charts   I assumed direction of critical care for this patient from another provider in my specialty: no     Care discussed with: admitting provider      Medications Ordered in the ED  diltiazem  (CARDIZEM ) injection 10 mg (has no administration in time range)  Medical Decision Making Amount and/or Complexity of Data Reviewed Labs: ordered. Radiology: ordered.  Risk Prescription drug management. Decision regarding hospitalization.   83 year old female presents emergency department worsening shortness of breath, hypoxia on her home requirement oxygen.  Patient found to have paroxysmal atrial fibrillation and has been placed on Eliquis  twice daily.  Patient is compliant and has been for the past month.  Denies any fever or productive cough/hemoptysis.  Tachypneic on exam, scattered Rales with diminished breath sounds at bases.  Trace edema of the bilateral ankles.  EKG shows atrial fibrillation, RVR with a baseline right bundle branch block.  Chest x-ray shows cardiomegaly with prominent pulmonary vessels.  Blood work shows a mild leukocytosis of 14, worsening CKD, BNP of 388 and a troponin of 89, increased from a baseline in the 30s.  Magnesium  is normal.  Patient did well with an IV dose of Cardizem , will transition to a drip for further rate control.  Patient continues to be tachypneic and short of breath with increased oxygen requirement, will give dose of diuretic and plan for medical admission.  Doubt PE at this time given compliance with anticoagulation.  Review  of previous studies show ejection fracture is around 70% on recent echo with noted diastolic dysfunction.  Patients evaluation and results requires admission for further treatment and care.  Spoke with hospitalist, reviewed patient's ED course and they accept admission.  Patient agrees with admission plan, offers no new complaints and is stable/unchanged at time of admit.     Final diagnoses:  None    ED Discharge Orders     None          Bari Roxie HERO, DO 07/13/24 2035

## 2024-07-13 NOTE — ED Triage Notes (Signed)
Denies CP.

## 2024-07-13 NOTE — ED Notes (Signed)
 Notified provider in person of troponin 103.

## 2024-07-14 ENCOUNTER — Other Ambulatory Visit: Payer: Self-pay

## 2024-07-14 ENCOUNTER — Inpatient Hospital Stay (HOSPITAL_COMMUNITY)

## 2024-07-14 DIAGNOSIS — I350 Nonrheumatic aortic (valve) stenosis: Secondary | ICD-10-CM

## 2024-07-14 DIAGNOSIS — I5032 Chronic diastolic (congestive) heart failure: Secondary | ICD-10-CM

## 2024-07-14 DIAGNOSIS — I5030 Unspecified diastolic (congestive) heart failure: Secondary | ICD-10-CM | POA: Diagnosis not present

## 2024-07-14 DIAGNOSIS — G9340 Encephalopathy, unspecified: Secondary | ICD-10-CM

## 2024-07-14 DIAGNOSIS — N179 Acute kidney failure, unspecified: Secondary | ICD-10-CM | POA: Diagnosis not present

## 2024-07-14 DIAGNOSIS — N1832 Chronic kidney disease, stage 3b: Secondary | ICD-10-CM

## 2024-07-14 DIAGNOSIS — J811 Chronic pulmonary edema: Secondary | ICD-10-CM | POA: Diagnosis not present

## 2024-07-14 DIAGNOSIS — N189 Chronic kidney disease, unspecified: Secondary | ICD-10-CM

## 2024-07-14 DIAGNOSIS — J9690 Respiratory failure, unspecified, unspecified whether with hypoxia or hypercapnia: Secondary | ICD-10-CM

## 2024-07-14 DIAGNOSIS — J9621 Acute and chronic respiratory failure with hypoxia: Secondary | ICD-10-CM

## 2024-07-14 DIAGNOSIS — E872 Acidosis, unspecified: Secondary | ICD-10-CM | POA: Insufficient documentation

## 2024-07-14 DIAGNOSIS — I272 Pulmonary hypertension, unspecified: Secondary | ICD-10-CM

## 2024-07-14 DIAGNOSIS — I4891 Unspecified atrial fibrillation: Secondary | ICD-10-CM | POA: Diagnosis not present

## 2024-07-14 DIAGNOSIS — R579 Shock, unspecified: Secondary | ICD-10-CM | POA: Insufficient documentation

## 2024-07-14 DIAGNOSIS — Z66 Do not resuscitate: Secondary | ICD-10-CM | POA: Insufficient documentation

## 2024-07-14 DIAGNOSIS — J449 Chronic obstructive pulmonary disease, unspecified: Secondary | ICD-10-CM

## 2024-07-14 DIAGNOSIS — Z7189 Other specified counseling: Secondary | ICD-10-CM

## 2024-07-14 DIAGNOSIS — R0602 Shortness of breath: Secondary | ICD-10-CM | POA: Diagnosis not present

## 2024-07-14 DIAGNOSIS — I480 Paroxysmal atrial fibrillation: Secondary | ICD-10-CM | POA: Diagnosis not present

## 2024-07-14 DIAGNOSIS — R918 Other nonspecific abnormal finding of lung field: Secondary | ICD-10-CM | POA: Diagnosis not present

## 2024-07-14 DIAGNOSIS — J90 Pleural effusion, not elsewhere classified: Secondary | ICD-10-CM | POA: Diagnosis not present

## 2024-07-14 LAB — CBC
HCT: 41.5 % (ref 36.0–46.0)
Hemoglobin: 12.7 g/dL (ref 12.0–15.0)
MCH: 30.3 pg (ref 26.0–34.0)
MCHC: 30.6 g/dL (ref 30.0–36.0)
MCV: 99 fL (ref 80.0–100.0)
Platelets: 188 K/uL (ref 150–400)
RBC: 4.19 MIL/uL (ref 3.87–5.11)
RDW: 15.7 % — ABNORMAL HIGH (ref 11.5–15.5)
WBC: 10.3 K/uL (ref 4.0–10.5)
nRBC: 0 % (ref 0.0–0.2)

## 2024-07-14 LAB — COMPREHENSIVE METABOLIC PANEL WITH GFR
ALT: 49 U/L — ABNORMAL HIGH (ref 0–44)
ALT: 89 U/L — ABNORMAL HIGH (ref 0–44)
AST: 69 U/L — ABNORMAL HIGH (ref 15–41)
AST: 123 U/L — ABNORMAL HIGH (ref 15–41)
Albumin: 2.4 g/dL — ABNORMAL LOW (ref 3.5–5.0)
Albumin: 2.3 g/dL — ABNORMAL LOW (ref 3.5–5.0)
Alkaline Phosphatase: 56 U/L (ref 38–126)
Alkaline Phosphatase: 55 U/L (ref 38–126)
Anion gap: 16 — ABNORMAL HIGH (ref 5–15)
Anion gap: 20 — ABNORMAL HIGH (ref 5–15)
BUN: 70 mg/dL — ABNORMAL HIGH (ref 8–23)
BUN: 76 mg/dL — ABNORMAL HIGH (ref 8–23)
CO2: 27 mmol/L (ref 22–32)
CO2: 26 mmol/L (ref 22–32)
Calcium: 8.4 mg/dL — ABNORMAL LOW (ref 8.9–10.3)
Calcium: 8.5 mg/dL — ABNORMAL LOW (ref 8.9–10.3)
Chloride: 94 mmol/L — ABNORMAL LOW (ref 98–111)
Chloride: 94 mmol/L — ABNORMAL LOW (ref 98–111)
Creatinine, Ser: 2.14 mg/dL — ABNORMAL HIGH (ref 0.44–1.00)
Creatinine, Ser: 2.24 mg/dL — ABNORMAL HIGH (ref 0.44–1.00)
GFR, Estimated: 22 mL/min — ABNORMAL LOW (ref >60)
GFR, Estimated: 21 mL/min — ABNORMAL LOW (ref >60)
Glucose, Bld: 198 mg/dL — ABNORMAL HIGH (ref 70–99)
Glucose, Bld: 89 mg/dL (ref 70–99)
Potassium: 4.3 mmol/L (ref 3.5–5.1)
Potassium: 4.2 mmol/L (ref 3.5–5.1)
Sodium: 137 mmol/L (ref 135–145)
Sodium: 140 mmol/L (ref 135–145)
Total Bilirubin: 0.8 mg/dL (ref 0.0–1.2)
Total Bilirubin: 0.6 mg/dL (ref 0.0–1.2)
Total Protein: 6.4 g/dL — ABNORMAL LOW (ref 6.5–8.1)
Total Protein: 6.5 g/dL (ref 6.5–8.1)

## 2024-07-14 LAB — RESPIRATORY PANEL BY PCR

## 2024-07-14 LAB — POCT I-STAT 7, (LYTES, BLD GAS, ICA,H+H)
Acid-Base Excess: 2 mmol/L (ref 0.0–2.0)
Acid-Base Excess: 2 mmol/L (ref 0.0–2.0)
Bicarbonate: 33 mmol/L — ABNORMAL HIGH (ref 20.0–28.0)
Bicarbonate: 32.7 mmol/L — ABNORMAL HIGH (ref 20.0–28.0)
Calcium, Ion: 1.12 mmol/L — ABNORMAL LOW (ref 1.15–1.40)
Calcium, Ion: 1.08 mmol/L — ABNORMAL LOW (ref 1.15–1.40)
HCT: 39 % (ref 36.0–46.0)
HCT: 42 % (ref 36.0–46.0)
Hemoglobin: 13.3 g/dL (ref 12.0–15.0)
Hemoglobin: 14.3 g/dL (ref 12.0–15.0)
O2 Saturation: 87 %
O2 Saturation: 95 %
Patient temperature: 98.3
Patient temperature: 97.4
Potassium: 4.3 mmol/L (ref 3.5–5.1)
Potassium: 4.4 mmol/L (ref 3.5–5.1)
Sodium: 138 mmol/L (ref 135–145)
Sodium: 139 mmol/L (ref 135–145)
TCO2: 36 mmol/L — ABNORMAL HIGH (ref 22–32)
TCO2: 35 mmol/L — ABNORMAL HIGH (ref 22–32)
pCO2 arterial: 83.7 mmHg (ref 32–48)
pCO2 arterial: 83.7 mmHg (ref 32–48)
pH, Arterial: 7.203 — ABNORMAL LOW (ref 7.35–7.45)
pH, Arterial: 7.197 — CL (ref 7.35–7.45)
pO2, Arterial: 68 mmHg — ABNORMAL LOW (ref 83–108)
pO2, Arterial: 93 mmHg (ref 83–108)

## 2024-07-14 LAB — LACTIC ACID, PLASMA: Lactic Acid, Venous: 2.6 mmol/L (ref 0.5–1.9)

## 2024-07-14 LAB — TROPONIN I (HIGH SENSITIVITY): Troponin I (High Sensitivity): 56 ng/L — ABNORMAL HIGH (ref <18)

## 2024-07-14 LAB — AMMONIA: Ammonia: 45 umol/L — ABNORMAL HIGH (ref 9–35)

## 2024-07-14 LAB — GLUCOSE, CAPILLARY: Glucose-Capillary: 88 mg/dL (ref 70–99)

## 2024-07-14 LAB — PROCALCITONIN: Procalcitonin: 29.34 ng/mL

## 2024-07-14 LAB — TSH: TSH: 0.381 u[IU]/mL (ref 0.350–4.500)

## 2024-07-14 MED ORDER — ARFORMOTEROL TARTRATE 15 MCG/2ML IN NEBU: 15.0000 ug | INHALATION_SOLUTION | Freq: Two times a day (BID) | RESPIRATORY_TRACT | Status: DC

## 2024-07-14 MED ORDER — AMIODARONE HCL IN DEXTROSE 360-4.14 MG/200ML-% IV SOLN: 30.0000 mg/h | INTRAVENOUS | Status: DC

## 2024-07-14 MED ORDER — REVEFENACIN 175 MCG/3ML IN SOLN
175.0000 ug | Freq: Every day | RESPIRATORY_TRACT | Status: DC
  Administered 2024-07-14: 175 ug via RESPIRATORY_TRACT
  Filled 2024-07-14: qty 3

## 2024-07-14 MED ORDER — BUDESON-GLYCOPYRROL-FORMOTEROL 160-9-4.8 MCG/ACT IN AERO
2.0000 | INHALATION_SPRAY | Freq: Two times a day (BID) | RESPIRATORY_TRACT | Status: DC
  Filled 2024-07-14: qty 5.9

## 2024-07-14 MED ORDER — IPRATROPIUM BROMIDE 0.02 % IN SOLN
0.5000 mg | Freq: Four times a day (QID) | RESPIRATORY_TRACT | Status: DC
  Administered 2024-07-14: 0.5 mg via RESPIRATORY_TRACT
  Filled 2024-07-14: qty 2.5

## 2024-07-14 MED ORDER — VANCOMYCIN HCL 1000 MG IV SOLR
1000.0000 mg | Freq: Once | INTRAVENOUS | Status: DC
  Filled 2024-07-14: qty 20

## 2024-07-14 MED ORDER — PANTOPRAZOLE SODIUM 40 MG IV SOLR: 40.0000 mg | Freq: Every day | INTRAVENOUS | Status: DC

## 2024-07-14 MED ORDER — SODIUM CHLORIDE 0.9 % IV SOLN
2.0000 g | INTRAVENOUS | Status: DC
  Administered 2024-07-14: 2 g via INTRAVENOUS
  Filled 2024-07-14: qty 20

## 2024-07-14 MED ORDER — MACITENTAN 10 MG PO TABS
10.0000 mg | ORAL_TABLET | Freq: Every day | ORAL | Status: DC
  Filled 2024-07-14: qty 1

## 2024-07-14 MED ORDER — LEVALBUTEROL HCL 0.63 MG/3ML IN NEBU: 0.6300 mg | INHALATION_SOLUTION | Freq: Four times a day (QID) | RESPIRATORY_TRACT | Status: DC | PRN

## 2024-07-14 MED ORDER — VANCOMYCIN HCL 500 MG/100ML IV SOLN: 500.0000 mg | INTRAVENOUS | Status: DC

## 2024-07-14 MED ORDER — VANCOMYCIN HCL IN DEXTROSE 1-5 GM/200ML-% IV SOLN
1000.0000 mg | Freq: Once | INTRAVENOUS | Status: DC
  Filled 2024-07-14: qty 200

## 2024-07-14 MED ORDER — METHYLPREDNISOLONE SODIUM SUCC 40 MG IJ SOLR
40.0000 mg | Freq: Two times a day (BID) | INTRAMUSCULAR | Status: DC
  Administered 2024-07-14: 40 mg via INTRAVENOUS
  Filled 2024-07-14: qty 1

## 2024-07-14 MED ORDER — SODIUM CHLORIDE 0.9 % IV SOLN
500.0000 mg | INTRAVENOUS | Status: DC
  Administered 2024-07-14: 500 mg via INTRAVENOUS
  Filled 2024-07-14: qty 5

## 2024-07-14 MED ORDER — FUROSEMIDE 10 MG/ML IJ SOLN
40.0000 mg | Freq: Once | INTRAMUSCULAR | Status: AC
  Administered 2024-07-14: 40 mg via INTRAVENOUS
  Filled 2024-07-14: qty 4

## 2024-07-14 MED ORDER — AMIODARONE LOAD VIA INFUSION
150.0000 mg | Freq: Once | INTRAVENOUS | Status: AC
  Administered 2024-07-14: 150 mg via INTRAVENOUS
  Filled 2024-07-14: qty 83.34

## 2024-07-14 MED ORDER — BUDESONIDE 0.5 MG/2ML IN SUSP: 0.5000 mg | Freq: Two times a day (BID) | RESPIRATORY_TRACT | Status: DC

## 2024-07-14 MED ORDER — VANCOMYCIN HCL IN DEXTROSE 1-5 GM/200ML-% IV SOLN: 1000.0000 mg | Freq: Once | INTRAVENOUS | Status: DC

## 2024-07-14 MED ORDER — HEPARIN (PORCINE) 25000 UT/250ML-% IV SOLN: 700.0000 [IU]/h | INTRAVENOUS | Status: DC

## 2024-07-14 MED ORDER — FUROSEMIDE 10 MG/ML IJ SOLN
80.0000 mg | Freq: Once | INTRAMUSCULAR | Status: DC
  Filled 2024-07-14: qty 8

## 2024-07-14 MED ORDER — AMIODARONE HCL IN DEXTROSE 360-4.14 MG/200ML-% IV SOLN
60.0000 mg/h | INTRAVENOUS | Status: DC
  Administered 2024-07-14: 60 mg/h via INTRAVENOUS
  Filled 2024-07-14: qty 200

## 2024-07-17 NOTE — Progress Notes (Signed)
 Patient arrived to room 3E16 from ED.  Assessment complete, VS obtained, and Admission database began.  Daughter at bedside.

## 2024-07-17 NOTE — Progress Notes (Signed)
 Patient is incontinent and unable to tolerate much movement without desatting. She required NRB to get sats up after turning in bed. Notified MD

## 2024-07-17 NOTE — Progress Notes (Signed)
 Aug 08, 2024 BP dropping. Despite BIPAP adjustments and MV 12, continues to be acidemic. Family at bedside. Ongoing GOC discussions unfortunately I think death is imminent, will try to see if we can make passing peaceful; family would like some time to discuss.

## 2024-07-17 NOTE — Progress Notes (Signed)
 Notified on call provider of the increased WOB, orders received for chest xray and bipap, RT notified.

## 2024-07-17 NOTE — Progress Notes (Signed)
 PHARMACY - ANTICOAGULATION CONSULT NOTE  Pharmacy Consult for heparin  Indication: atrial fibrillation  Allergies  Allergen Reactions   Aldomet [Methyldopa] Hives   Tyvaso  [Treprostinil ] Diarrhea    Patient Measurements: Height: 5' 2 (157.5 cm) Weight: 47.9 kg (105 lb 9.6 oz) IBW/kg (Calculated) : 50.1 HEPARIN  DW (KG): 40.8  Vital Signs: Temp: 99 F (37.2 C) 07/22/2024 1229) Temp Source: Axillary 07/22/24 1229) BP: 119/51 07-22-24 1229) Pulse Rate: 49 Jul 22, 2024 1231)  Labs: Recent Labs    07/13/24 1714 07/13/24 2000 07-22-2024 0244 07-22-2024 0732 July 22, 2024 1411  HGB 13.6  --  12.7  --  13.3  HCT 42.6  --  41.5  --  39.0  PLT 221  --  188  --   --   CREATININE 2.12*  --  2.14*  --   --   TROPONINIHS 89* 103*  --  56*  --     Estimated Creatinine Clearance: 15.1 mL/min (A) (by C-G formula based on SCr of 2.14 mg/dL (H)).   Medical History: Past Medical History:  Diagnosis Date   Arthritis    Asthma    CHF (congestive heart failure) (HCC)    CKD (chronic kidney disease)    COPD (chronic obstructive pulmonary disease) (HCC)    dR. wERT   Diabetes mellitus (HCC)    type 2   Gout    Hyperlipidemia    Hypertension    Shortness of breath    WITH EXERTION    Sleep apnea    USES  CPAP    Stroke (HCC)     Medications:  Medications Prior to Admission  Medication Sig Dispense Refill Last Dose/Taking   albuterol  (VENTOLIN  HFA) 108 (90 Base) MCG/ACT inhaler INHALE 1-2 PUFFS BY MOUTH EVERY 6 HOURS AS NEEDED FOR WHEEZE OR SHORTNESS OF BREATH 18 each 3 Unknown   amLODipine  (NORVASC ) 10 MG tablet Take 1 tablet (10 mg total) by mouth daily. TAKE 1 TABLET BY MOUTH EVERYDAY AT BEDTIME 90 tablet 3 07/13/2024   apixaban  (ELIQUIS ) 2.5 MG TABS tablet Take 1 tablet (2.5 mg total) by mouth 2 (two) times daily. 60 tablet 11 07/12/2024 at  8:00 AM   atorvastatin  (LIPITOR) 80 MG tablet Take 1 tablet (80 mg total) by mouth daily. 90 tablet 3 07/12/2024   bisoprolol  (ZEBETA ) 10 MG tablet TAKE 1  TABLET BY MOUTH EVERY DAY 90 tablet 3 07/13/2024   dapagliflozin  propanediol (FARXIGA ) 10 MG TABS tablet Take 1 tablet (10 mg total) by mouth daily. 90 tablet 3 07/13/2024   ezetimibe  (ZETIA ) 10 MG tablet TAKE 1 TABLET BY MOUTH EVERY DAY 90 tablet 0 07/13/2024   Fluticasone -Umeclidin-Vilant (TRELEGY ELLIPTA ) 100-62.5-25 MCG/ACT AEPB Inhale 1 puff into the lungs daily. 60 each 11 07/13/2024   furosemide  (LASIX ) 40 MG tablet TAKE 2 TABLETS BY MOUTH EVERY MORNING. CAN TAKE EXTRA DOSE EVERY OTHER AFTERNOON WITH WEIGHT GAIN. 270 tablet 3 07/13/2024 Morning   OXYGEN Inhale 2 L into the lungs continuous.   07/13/2024   potassium chloride  SA (KLOR-CON  M20) 20 MEQ tablet Take 1 tablet (20 mEq total) by mouth 3 (three) times daily.   07/13/2024 Morning   sildenafil  (REVATIO ) 20 MG tablet TAKE 4 TABLETS (80 MG TOTAL) BY MOUTH 3 (THREE) TIMES DAILY. 160 tablet 0 07/13/2024 Morning   macitentan  (OPSUMIT ) 10 MG tablet TAKE 1 TABLET (10MG ) BY MOUTH DAILY. (Patient not taking: Reported on 07/13/2024) 90 tablet 3 Not Taking    Assessment: 83 y.o. F on apixaban  pta for afib. Last dose in-hospital 8/28  2350. Plan to hold apixaban  and start heparin  for now. Apixaban  will be affecting heparin  level so will utilize aPTT for monitoring until levels correlate.   Goal of Therapy:  Heparin  level 0.3-0.7 units/ml aPTT 66-102 seconds Monitor platelets by anticoagulation protocol: Yes   Plan:  Start heparin  700 units/hr Will f/u 8h aPTT Daily heparin  level, aPTT, and CBC  Vito Ralph, PharmD, BCPS Please see amion for complete clinical pharmacist phone list 08/08/2024,3:39 PM

## 2024-07-17 NOTE — Progress Notes (Signed)
   07/16/2024 0641  BiPAP/CPAP/SIPAP  $ Non-Invasive Ventilator  Non-Invasive Vent Initial  $ Face Mask Small Yes  BiPAP/CPAP/SIPAP Pt Type Adult  BiPAP/CPAP/SIPAP SERVO  Mask Type Full face mask  Mask Size Small  Set Rate 15 breaths/min  Respiratory Rate 36 breaths/min  IPAP 17 cmH20  EPAP 7 cmH2O  FiO2 (%) 70 %  Peak Inspiratory Pressure (PIP) 18  Tidal Volume (Vt) 30  Patient Home Machine No  Patient Home Mask No  Patient Home Tubing No  BiPAP/CPAP /SiPAP Vitals  Pulse Rate 97  Resp (!) 35  Bilateral Breath Sounds Diminished  MEWS Score/Color  MEWS Score 2  MEWS Score Color Yellow

## 2024-07-17 NOTE — Consult Note (Addendum)
 Advanced Heart Failure Team Consult Note   Primary Physician: Petrina Pries, NP Cardiologist:  None  Reason for Consultation: Acute on chronic respiratory failure  HPI:    Beth [name] is seen today for evaluation of acute on chronic respiratory failure at the request of Dr. Claudene with PCCM. 83 y.o. female who has a history of COPD on home oxygen, prior tobacco use, untreated OSA, chronic diastolic CHF/RV failure, and pulmonary hypertension.    She was admitted in 5/17 with acute on chronic diastolic CHF with prominent RV failure and marked shortness of breath.  She was diuresed with IV Lasix .  Echo showed dilated and dysfunctional RV with pulmonary hypertension.  RHC showed marked pulmonary hypertension with systemic PA pressure.  She was started on Revatio  in the hospital and this was titrated up.  Subsequently, she was started on macitentan .  She next started Selexipag  but she had intractable side effects with selexipag  so stopped it.  She then tried Tyvaso , but she has developed abdominal discomfort and diarrhea with Tyvaso  and has had to stop it (symptomatic even at lowest dose). She did not feel like it helped her breathing when she was on it.  She stopped sildenafil  and started riociguat . However, she developed painful hand swelling on riociguat  and has had to stop it.  Symptoms resolved off riociguat .       Echo in 11/19 showed EF 60-65%, mild AS, normal RV size and systolic function.     Echo in 12/20 showed EF 65-70%, LV mid-cavity gradient to 65 mmHg, mild AS with mean gradient 14 mmHg, normal RV size and systolic function, IVC normal, no TR jet. Echo in 1/22 showed EF 65-70%, mild LVH, mid-cavity LV gradient peak 27 mmHg, normal RV size and systolic function, normal IVC, PASP 18 mmHg, mild AS.    Echo 5/24 showed EF 70-75%, mid-cavity gradient to 32 mmHg with valsalva, normal RV, unable to estimate PA systolic pressure, IVC normal, mild AS mean gradient 12 mmHg, mild MR.    She  had an ER visit in 6/25 and was noted to be in atrial fibrillation.  No Afib on monitor in July 2025.  Echo 06/30/24: EF 70-75%, hyperdynamic LV function, RV mildly reduced, severely calcified aortic valve with mild AS and mean gradient 15 mmHg  She presented to the ED yesterday with complaints of worsening dyspnea X 1.5 weeks and hypoxic on home O2.  She was found to be in atrial fibrillation with RVR. Lagbs significant for Scr 2.12 (baseline 1.2), CO2 25, K 5.2, WBCs 14K, Tbili 1.5, AST 49, BNP 388 (typically less than 100), HS troponin mildly elevated with flat trend. She was given IV diltiazem  and started on diltiazem  infusion. She was given 500 cc IVF and IV lasix . There was concern for volume depletion and she was started on LR at 75/hr X 10 hrs. Respiratory and mental status worsened this am. She was started on BiPAP and transferred to the ICU.  CCM initiated IV steroids and empiric CAP coverage. PCT and respiratory pathogen panel pending.   Home Medications Prior to Admission medications   Medication Sig Start Date End Date Taking? Authorizing Provider  albuterol  (VENTOLIN  HFA) 108 (90 Base) MCG/ACT inhaler INHALE 1-2 PUFFS BY MOUTH EVERY 6 HOURS AS NEEDED FOR WHEEZE OR SHORTNESS OF BREATH 05/23/24  Yes Parrett, Tammy S, NP  amLODipine  (NORVASC ) 10 MG tablet Take 1 tablet (10 mg total) by mouth daily. TAKE 1 TABLET BY MOUTH EVERYDAY AT BEDTIME 06/05/24  Yes Rolan Ezra RAMAN, MD  apixaban  (ELIQUIS ) 2.5 MG TABS tablet Take 1 tablet (2.5 mg total) by mouth 2 (two) times daily. 06/05/24  Yes Rolan Ezra RAMAN, MD  atorvastatin  (LIPITOR) 80 MG tablet Take 1 tablet (80 mg total) by mouth daily. 06/05/24  Yes Rolan Ezra RAMAN, MD  bisoprolol  (ZEBETA ) 10 MG tablet TAKE 1 TABLET BY MOUTH EVERY DAY 05/17/24  Yes Rolan Ezra RAMAN, MD  dapagliflozin  propanediol (FARXIGA ) 10 MG TABS tablet Take 1 tablet (10 mg total) by mouth daily. 06/05/24  Yes Rolan Ezra RAMAN, MD  ezetimibe  (ZETIA ) 10 MG tablet TAKE 1 TABLET  BY MOUTH EVERY DAY 06/27/24  Yes Rolan Ezra RAMAN, MD  Fluticasone -Umeclidin-Vilant (TRELEGY ELLIPTA ) 100-62.5-25 MCG/ACT AEPB Inhale 1 puff into the lungs daily. 07/30/23  Yes Parrett, Tammy S, NP  furosemide  (LASIX ) 40 MG tablet TAKE 2 TABLETS BY MOUTH EVERY MORNING. CAN TAKE EXTRA DOSE EVERY OTHER AFTERNOON WITH WEIGHT GAIN. 11/30/23  Yes Rolan Ezra RAMAN, MD  OXYGEN Inhale 2 L into the lungs continuous.   Yes [provider]  potassium chloride  SA (KLOR-CON  M20) 20 MEQ tablet Take 1 tablet (20 mEq total) by mouth 3 (three) times daily. 05/16/24  Yes Terra Fairy PARAS, PA-C  sildenafil  (REVATIO ) 20 MG tablet TAKE 4 TABLETS (80 MG TOTAL) BY MOUTH 3 (THREE) TIMES DAILY. 05/15/24  Yes Rolan Ezra RAMAN, MD  macitentan  (OPSUMIT ) 10 MG tablet TAKE 1 TABLET (10MG ) BY MOUTH DAILY. Patient not taking: Reported on 07/13/2024 05/07/23   Bensimhon, Toribio SAUNDERS, MD    Past Medical History: Past Medical History:  Diagnosis Date   Arthritis    Asthma    CHF (congestive heart failure) (HCC)    CKD (chronic kidney disease)    COPD (chronic obstructive pulmonary disease) (HCC)    dR. wERT   Diabetes mellitus (HCC)    type 2   Gout    Hyperlipidemia    Hypertension    Shortness of breath    WITH EXERTION    Sleep apnea    USES  CPAP    Stroke [facility])     Past Surgical History: Past Surgical History:  Procedure Laterality Date   CARDIAC CATHETERIZATION N/A 04/02/2016   Procedure: Right Heart Cath;  Surgeon: Ezra RAMAN Rolan, MD;  Location: [facility] INVASIVE CV LAB;  Service: Cardiovascular;  Laterality: N/A;   CATARACT EXTRACTION W/PHACO Right 04/19/2013   Procedure: CATARACT EXTRACTION PHACO AND INTRAOCULAR LENS PLACEMENT (IOC);  Surgeon: Jestine Bunnell, MD;  Location: [facility] OR;  Service: Ophthalmology;  Laterality: Right;   CATARACT EXTRACTION W/PHACO Left 05/03/2013   Procedure: CATARACT EXTRACTION PHACO AND INTRAOCULAR LENS PLACEMENT (IOC);  Surgeon: Jestine Bunnell, MD;  Location: [facility] OR;  Service: Ophthalmology;   Laterality: Left;   EYE SURGERY     TUBAL LIGATION      Family History: Family History  Problem Relation Age of Onset   Heart disease Mother    Heart failure Mother    Heart disease Brother    Heart disease Brother    Heart disease Sister    Heart disease Sister    Heart failure Father     Social History: Social History   Socioeconomic History   Marital status: Widowed    Spouse name: Not on file   Number of children: 2   Years of education: Not on file   Highest education level: Not on file  Occupational History   Occupation: retired    Associate Professor: OTHER    Comment: [employer]  Tobacco Use   Smoking status: Former    Current packs/day: 0.00    Average packs/day: 1.5 packs/day for 40.0 years (60.0 ttl pk-yrs)    Types: Cigarettes    Start date: 11/16/1964    Quit date: 11/16/2004    Years since quitting: 19.6   Smokeless tobacco: Former  Building services engineer status: Never Used  Substance and Sexual Activity   Alcohol use: No    Comment: occassional   Drug use: No   Sexual activity: Not on file  Other Topics Concern   Not on file  Social History Narrative   Not on file   Social Drivers of Health   Financial Resource Strain: Low Risk  (06/09/2024)   Overall Financial Resource Strain (CARDIA)    Difficulty of Paying Living Expenses: Not very hard  Food Insecurity: Food Insecurity Present (06/09/2024)   Hunger Vital Sign    Worried About Running Out of Food in the Last Year: Sometimes true    Ran Out of Food in the Last Year: Sometimes true  Transportation Needs: No Transportation Needs (06/09/2024)   PRAPARE - Administrator, Civil Service (Medical): No    Lack of Transportation (Non-Medical): No  Physical Activity: Inactive (09/08/2023)   Exercise Vital Sign    Days of Exercise per Week: 0 days    Minutes of Exercise per Session: 0 min  Stress: No Stress Concern Present (09/08/2023)   [employer] of Occupational Health - Occupational  Stress Questionnaire    Feeling of Stress : Not at all  Social Connections: Socially Isolated (09/08/2023)   Social Connection and Isolation Panel    Frequency of Communication with Friends and Family: More than three times a week    Frequency of Social Gatherings with Friends and Family: Three times a week    Attends Religious Services: Never    Active Member of Clubs or Organizations: No    Attends Banker Meetings: Never    Marital Status: Widowed    Allergies:  Allergies  Allergen Reactions   Aldomet [Methyldopa] Hives   Tyvaso  [Treprostinil ] Diarrhea    Objective:    Vital Signs:   Temp:  [96.9 F (36.1 C)-99 F (37.2 C)] 99 F (37.2 C) 2024-08-07 1229) Pulse Rate:  [49-127] 49 August 07, 2024 1231) Resp:  [20-44] 29 07-Aug-2024 1229) BP: (111-152)/(50-104) 119/51 07-Aug-2024 1229) SpO2:  [87 %-100 %] 93 % 07-Aug-2024 1229) FiO2 (%):  [30 %-72 %] 50 % Aug 07, 2024 0827) Weight:  [40.8 kg-47.9 kg] 47.9 kg (08/28 2300)    Weight change: Filed Weights   07/13/24 1648 07/13/24 2300  Weight: 40.8 kg 47.9 kg    Intake/Output:   Intake/Output Summary (Last 24 hours) at 2024/08/07 1420 Last data filed at August 07, 2024 0600 Gross per 24 hour  Intake 536.93 ml  Output 250 ml  Net 286.93 ml      Physical Exam    General:  Acute on chronically ill appearing Cor: JVP to midneck. Irregular rhythm, tachycardic. No murmurs. Lungs: crackles in bases Abdomen: soft, nontender, nondistended.  Extremities: no edema Neuro: Lethargic   Telemetry   Afib 120s  EKG    Afib 158 bpm  Labs   Basic Metabolic Panel: Recent Labs  Lab 07/13/24 1714 08-07-24 0244 08-07-24 1411  NA 137 137 138  K 5.2* 4.3 4.3  CL 94* 94*  --   CO2 25 27  --   GLUCOSE 111* 198*  --   BUN 64* 70*  --  CREATININE 2.12* 2.14*  --   CALCIUM  8.7* 8.4*  --   MG 2.3  --   --     Liver Function Tests: Recent Labs  Lab 07/13/24 1714 Aug 02, 2024 0244  AST 49* 69*  ALT 28 49*  ALKPHOS 51 56  BILITOT 1.5*  0.8  PROT 7.2 6.4*  ALBUMIN  2.8* 2.4*   No results for input(s): LIPASE, AMYLASE in the last 168 hours. Recent Labs  Lab Aug 02, 2024 1315  AMMONIA 45*    CBC: Recent Labs  Lab 07/13/24 1714 08-02-24 0244 August 02, 2024 1411  WBC 14.1* 10.3  --   HGB 13.6 12.7 13.3  HCT 42.6 41.5 39.0  MCV 96.4 99.0  --   PLT 221 188  --     Cardiac Enzymes: No results for input(s): CKTOTAL, CKMB, CKMBINDEX, TROPONINI in the last 168 hours.  BNP: BNP (last 3 results) Recent Labs    09/20/23 0904 06/05/24 1145 07/13/24 1714  BNP 80.6 61.4 388.9*    ProBNP (last 3 results) No results for input(s): PROBNP in the last 8760 hours.   CBG: No results for input(s): GLUCAP in the last 168 hours.  Coagulation Studies: No results for input(s): LABPROT, INR in the last 72 hours.   Imaging   DG CHEST PORT 1 VIEW Result Date: 2024-08-02 EXAM: 1 VIEW XRAY OF THE CHEST 2024/08/02 07:05:30 AM COMPARISON: 07/13/2024 CLINICAL HISTORY: SOB (shortness of breath) FINDINGS: LUNGS AND PLEURA: Mild pulmonary edema. Trace bilateral pleural effusions. Retrocardiac opacification in the left base may reflect atelectasis or airspace disease. HEART AND MEDIASTINUM: Mild cardiomegaly Aortic atherosclerosis. BONES AND SOFT TISSUES: No acute osseous abnormality. IMPRESSION: 1. Mild pulmonary edema and trace bilateral pleural effusions. 2. Retrocardiac opacification in the left base, possibly atelectasis or airspace disease. 3. Mild cardiomegaly. Electronically signed by: Waddell Calk MD 2024-08-02 07:17 AM EDT RP Workstation: [id number]   DG Chest 2 View Result Date: 07/13/2024 CLINICAL DATA:  Shortness of breath EXAM: CHEST - 2 VIEW COMPARISON:  05/13/2024 FINDINGS: No consolidation. Cardiomegaly with prominent central pulmonary vessels. Emphysema. Mild atelectasis at the left base. No pleural effusion or pneumothorax. Aortic atherosclerosis. IMPRESSION: Cardiomegaly with prominent central pulmonary  vessels. Emphysema. No acute airspace disease Electronically Signed   By: Luke Bun M.D.   On: 07/13/2024 19:30     Medications:     Current Medications:  apixaban   2.5 mg Oral BID   arformoterol   15 mcg Nebulization BID   atorvastatin   80 mg Oral Daily   bisoprolol   10 mg Oral Daily   budesonide  (PULMICORT ) nebulizer solution  0.5 mg Nebulization BID   ezetimibe   10 mg Oral Daily   methylPREDNISolone  (SOLU-MEDROL ) injection  40 mg Intravenous Q12H   pantoprazole  (PROTONIX ) IV  40 mg Intravenous QHS   revefenacin   175 mcg Nebulization Daily   sildenafil   80 mg Oral TID   sodium chloride  flush  3 mL Intravenous Q12H    Infusions:  azithromycin      cefTRIAXone  (ROCEPHIN )  IV        Patient Profile   83 y.o. female with history of pulmonary hypertension, chronic diastolic CHF, COPD, chronic respiratory failure on home O2, untreated OSA, CKD IIIb. Admitted 07/13/24 with acute on chronic respiratory failure and recurrent atrial fibrillation with RVR. Transferred to the ICU on 2024/08/02 for worsening respiratory and mental status.  Assessment/Plan  Acute on chronic respiratory failure with hypoxia and hypercarbia -Baseline on 2L O2 -Now requiring BiPAP. ABG this afternoon pH 7.2/83/68/33   -Worry that  she would struggle to wean from MV if required intubation -Abx and IV steroids per CCM. PCT and respiratory pathogen panel pending. ? Primarily d/t COPD exacerbation/CAP -Has pulmonary HTN. RV and PA pressure okay on last echo a couple of weeks ago -BNP above her prior baseline. Appears volume up after IVF resuscitation. Will give 80 mg lasix  IV X 1. 2. 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  She was very short of breath with exertion and had very elevated right-sided filling pressures on 5/17 RHC.  PA pressure was systemic. Cardiac output low, but not moving towards IV Flolan given  the mixed etiology of her PH.  V/Q scan not suggestive of chronic PEs and autoimmune serologies sent (RF negative, ANCA negative, ANA only weakly positive (1:80). She has been unable to tolerate selexipag , Tyvaso , or riociguat . Echo in 1/22 showed normal RV size and systolic function and estimation of PA pressure, surprisingly, was not significantly elevated. Echo 5/24 showed normal RV size and systolic function, unable to estimate PA systolic pressure.   Echo 06/30/24: EF 70-75%, LV hyperdynamic, RV mildly reduced, RVSP 33 mmHg - Continue sildenafil  80 mg tid.  - Continue Opsumit  10 mg daily.  - Home meds reordered - She was unable to Tyvaso , selexipag , or riociguat .  2. Atrial fibrillation with RVR: Hx PAF.  - Agree with stopping diltiazem  gtt - Load with IV amiodarone . TSH pending. LFTs mildly elevated - Switch Eliquis  to heparin  gtt while on BiPAP 3. COPD: On home oxygen.  Prior long-time smoker. See discussion in #1 4. Chronic diastolic CHF with prominent RV failure: Echo in 1/22 with RV function improved, appears normal.  Echo 5/24 showed EF 70-75%, mid-cavity gradient to 32 mmHg with valsalva, normal RV, unable to estimate PA systolic pressure, IVC normal, mild AS mean gradient 12 mmHg, mild MR.  Echo 08/25: EF 70-75% with hyperdynamic LV and mildly reduced RV, RVSP 33 mHg, calcified aortic valve with mild AS - Volume mildly up as above. 1 dose of IV lasix . - Hold bisoprolol  for now, BP soft - Hold Farxiga  for now 5. SVT: Denies palpitations.  6. OSA: Has been unable to tolerate CPAP.  She uses home oxygen.  OSA may have small contribution to [facility] but does not explain the extent of PAH.  7. Aortic stenosis: Mild on most recent echo 8. AKI: Scr baseline 1.2, up to >2 on admit - ? Episodes of hypotension  Length of Stay: 1  FINCH, LINDSAY N, PA-C  07/29/24, 2:20 PM  Advanced Heart Failure Team Pager [phone] (M-F; 7a - 5p)  Please contact [facility] for night-coverage after hours  (4p -7a ) and weekends on [email or web address]   Patient seen with PA, I formulated the plan and agree with the above note.   The patient is frail at baseline but had been stable recently from a cardiac standpoint.  Most recent echo in 8/25 showed EF 70-75%, mild LVH, mild RV dysfunction, PASP 34 mmHg, mild AS with mean gradient 15 mmHg, IVC normal. She was admitted with dyspnea, found to have acute hypercarbic respiratory failure and to be in AF with RVR. She was started on Bipap, IV steroids, antibiotics, diltiazem  gtt.  PCT 29.  Creatinine up from her baseline, 2.14.   Currently, patient is wearing Bipap, will follow commands but lethargic-appearing.   General: Wearing bipap Neck: JVP difficult, no thyromegaly or thyroid  nodule.  Lungs: Distant BS with prolonged  expiratory phase and soft wheezes CV: Nondisplaced PMI.  Heart tachy,  irregular S1/S2, no S3/S4, 2/6 early SEM RUSB.  No peripheral edema.  No carotid bruit.  Normal pedal pulses.  Abdomen: Soft, nontender, no hepatosplenomegaly, no distention.  Skin: Intact without lesions or rashes.  Neurologic: Lethargic, will follow commands.  Extremities: No clubbing or cyanosis.  HEENT: Normal.   1. Acute on chronic hypoxemic/hypercarbic respiratory failure: ABG 7.203/84/68. Baseline COPD on home oxygen.  She is now on Bipap.  I suspect that this is a primary respiratory issue, COPD exacerbation possibly with PNA given PCT 29.  - IV steroids - vancomycin /azithromycin /ceftriaxone  - I worry that she would do poorly if she had to be intubated, would be difficult to extubate.  2. Chronic diastolic CHF with prominent RV failure: Echo in 8/25 with EF 70-75%, mild LVH, mild RV dysfunction, PASP 34 mmHg, mild AS with mean gradient 15 mmHg, IVC normal.  She does not look markedly volume overloaded on exam.  - Given degree of dyspnea, will give Lasix  80 mg IV x 1 but will not give further Lasix  until we have objective evidence for volume overload.  - Place PICC  to follow CVP.  - Hold bisoprolol  and Farxiga  for now.  3. Pulmonary hypertension: Suspected mixed WHO group 1 and WHO group 3 (COPD, OSA). Most recent echo with mild RV dysfunction, PASP estimate only 34 mmHg.  - Would continue home sildenafil  and Opsumit .  4. Atrial fibrillation: With RVR this admission.  Has h/o PAF. Suspect this was triggered by COPD exacerbation.  - Heparin  gtt for now.  - Start amiodarone  gtt (use instead of diltiazem ).  5. AKI: Creatinine up to 2.14.  Follow closely.  6. AS: Mild on echo in 8/25.  7. OSA: Unable to tolerate CPAP.   CRITICAL CARE Performed by: Ezra Shuck  Total critical care time: 70 minutes  Critical care time was exclusive of separately billable procedures and treating other patients.  Critical care was necessary to treat or prevent imminent or life-threatening deterioration.  Critical care was time spent personally by me on the following activities: development of treatment plan with patient and/or surrogate as well as nursing, discussions with consultants, evaluation of patient's response to treatment, examination of patient, obtaining history from patient or surrogate, ordering and performing treatments and interventions, ordering and review of laboratory studies, ordering and review of radiographic studies, pulse oximetry and re-evaluation of patient's condition.  Ezra Shuck 07/15/24 3:53 PM

## 2024-07-17 NOTE — IPAL (Addendum)
  Interdisciplinary Goals of Care Family Meeting   Date carried out: Jul 22, 2024  Location of the meeting: Bedside  Member's involved: Physician, Nurse Practitioner, Bedside Registered Nurse, Chaplain, and Family Member or next of kin  Durable Power of Attorney or acting medical decision maker: daughter     Discussion: We discussed goals of care for Beth [name] .  Beth [name] has continued to decline, now developing shock in addition to worsening gas exchange despite escalating interventions. Dr Claudene and I spoke with family about code status and goals of care, I later spoke with additional family in the waiting room and was joined by Dr Claudene and Dr Rolan as she was rapidly deteriorating  In coming back to the room it was evident that the patient was in the dying process. A decision was reached to change code status to DNR  Expect in-hospital death, minutes-hours   Code status:   Code Status: Do not attempt resuscitation (DNR) - Comfort care   Disposition: Expect in-hospital death   Time spent for the meeting:    Beth [name] FORBES Gave, NP  2024/07/22, 4:48 PM

## 2024-07-17 NOTE — Patient Instructions (Signed)
 Visit Information  Thank you for taking time to visit with me today. Please don't hesitate to contact me if I can be of assistance to you before our next scheduled appointment.  Your next care management appointment is by telephone on 07/19/2024 at 2:00pm    Please call the care guide team at [phone] if you need to cancel, schedule, or reschedule an appointment.   Please call the Suicide and Crisis Lifeline: 988 go to [facility] Urgent [facility] [address], [place] [phone]) call 911 if you are experiencing a Mental Health or Behavioral Health Crisis or need someone to talk to.  Tobias CHARM Maranda HEDWIG, PhD [facility], [facility] Social Worker Direct Dial: [phone]  Fax: [phone]

## 2024-07-17 NOTE — Progress Notes (Signed)
 Interval progress note     Shortly after code status was updated to DNR, the patient went into asystole, with family by the bedside. I examined the patient, and auscultated for heart and lung sounds, which were absent.    Time of death declared 16:47   Ronnald Gave MSN, AGACNP-BC [facility] Pulmonary/Critical Care Medicine 2024/07/28, 4:51 PM

## 2024-07-17 NOTE — Patient Outreach (Signed)
 Complex Care Management   Visit Note  07/13/2024  Name:  Beth [name] MRN: [id number] DOB: April 24, 1941  Situation: Referral received for Complex Care Management related to HHA/PCS  I obtained verbal consent from Patient.  Visit completed with Patient  on the phone  Background:   Past Medical History:  Diagnosis Date   Arthritis    Asthma    CHF (congestive heart failure) (HCC)    CKD (chronic kidney disease)    COPD (chronic obstructive pulmonary disease) (HCC)    dR. wERT   Diabetes mellitus (HCC)    type 2   Gout    Hyperlipidemia    Hypertension    Shortness of breath    WITH EXERTION    Sleep apnea    USES  CPAP    Stroke [facility])     Assessment: Patient was not feeling well and also stated that her daughter was not there due to her daughter having to return to work. Patient stated that she will let her daughter know that the SW called but will also let her daughter know that she was not feeling well.SW stated that if she needed to call her PCP because she was feeling well to please so or call 911.  SDOH Interventions    Flowsheet Row Patient Outreach Telephone from 06/09/2024 in [place] [facility]PART[facility] Telephone from 06/06/2024 in [place] POP[facility]ATION HEALTH DEPARTMENT Patient Outreach Telephone from 05/30/2024 in  POPULATION HEALTH DEPARTMENT Clinical Support from 09/08/2023 in [facility] Telephone from 04/07/2023 in [facility]  SDOH Interventions       Food Insecurity Interventions Intervention Not Indicated  [Patient stated that she is ok with food currently] Community Resources Provided  [patient refered to community nutrition program  and meals on wheels] Intervention Not Indicated Intervention Not Indicated --  Housing Interventions Intervention Not Indicated -- Intervention Not Indicated Intervention Not Indicated --  Transportation Interventions Intervention Not  Indicated -- Intervention Not Indicated Intervention Not Indicated Payor Benefit  Utilities Interventions Intervention Not Indicated -- --  [patient would like resources to help pay utility bill] Intervention Not Indicated Other (Comment)  [family assisted pt but also referred for patient care fund]  Alcohol Usage Interventions -- -- -- Intervention Not Indicated (Score <7) --  Financial Strain Interventions Intervention Not Indicated -- -- Intervention Not Indicated Other (Comment)  [discussed patient care fund]  Physical Activity Interventions -- -- -- Other (Comments)  [walks around the house] --  Stress Interventions -- -- -- Intervention Not Indicated --  Social Connections Interventions -- Programmer, applications Provided  [Senior resources material provided] -- Intervention Not Indicated --  Health Literacy Interventions -- -- -- Intervention Not Indicated --    Recommendation:   none  Follow Up Plan:   Telephone follow up appointment date/time:  07/19/2024 at 2:00pm  Tobias CHARM Maranda HEDWIG, PhD [facility], [facility] Social Worker Direct Dial: [phone]  Fax: [phone]

## 2024-07-17 NOTE — Consult Note (Addendum)
 Beth [name], MRN:  [id number], DOB:  06-06-1941, LOS: 1 ADMISSION DATE:  07/13/2024, CONSULTATION DATE:  07/15/24 REFERRING MD:  Drusilla, CHIEF COMPLAINT:  Resp failure    History of Present Illness:  83 yo F PMH COPD, chronic hypoxia on 2L,  Afib on eliquis , chronic diastolic HF, pulmonary HTN (intolerant of selexipag  tyvaso  riociguat ), OSA, CKD 3b who was admitted to [facility] 8/28 w CC SOB. Family describes worsening SOB x 1wk, but as of a few days ago could still get around ok. No sick contacts. COVID flu RSV neg. Afebrile, no leukocytosis. BNP elevated to 390. AKI on CKD w Cr 2.12  She was started on BiPAP 2024/07/15 AM for worse SOB. PCCM consulted in this setting   Pertinent  Medical History  Chronic hypoxic resp failure COPD OSA intolerant of BiPAP Afib on eliquis  Pulmonary HTN (felt likely 1 and 3)  Diastolic HF  HLD HTN  AS   Significant Hospital Events: Including procedures, antibiotic start and stop dates in addition to other pertinent events   8/28 admitted to [facility] SOB 07-15-24 PCCM consult ICU txf   Interim History / Subjective:  On BiPAP   Objective    Blood pressure 127/67, pulse 98, temperature (!) 96.9 F (36.1 C), temperature source Axillary, resp. rate 20, height 5' 2 (1.575 m), weight 47.9 kg, SpO2 96%.    FiO2 (%):  [30 %-72 %] 50 %   Intake/Output Summary (Last 24 hours) at 2024/07/15 1230 Last data filed at 15-Jul-2024 0600 Gross per 24 hour  Intake 536.93 ml  Output 250 ml  Net 286.93 ml   Filed Weights   07/13/24 1648 07/13/24 2300  Weight: 40.8 kg 47.9 kg    Examination: General: chronically and acutely ill elderly F NAD  HENT: BiPAP in place with good seal  Lungs: Pretty clear. Increased rate, some accessory muscle use  Cardiovascular: irir  Abdomen: soft  Extremities: no obvious acute joint deformity  Neuro: Does not respond to painful stimuli.  GU: defer  Resolved problem list   Assessment and Plan   Acute encephalopathy  -BUN up from  baseline. More hypoxic, there is no ABG so not sure if hypercarbic.  P -txf to ICU, close monitoring for airway protection -NPO -Ive ordered an ABG, ammonia, TSH, CBG, CMP and LA   -If she doesn't perk up on BiPAP, CT H  AoC hypoxic resp failure Severe pulmonary HTN -on sildenafil  80 TID and opsumit  10mg  at home COPD OSA intolerant of CPAP -BNP elevated to 390 and doesn't sound wet & is close to her last office weight w HF, afebrile and CXR pretty clear, not wheezy.  P -as above, getting ABG -have ordered RVP, PCT -empiric CAP coverage though cxr not very exciting -will start on IV steroids though not wheezy. Triple therapy -really, trying salvage measures to try to prevent intubation. Is high risk and several underlying resp related comorbidities  -can consider add'l dose lasix  this afternoon (think will need higher dose than 40 IV w home dose 80 PO and here w AKI on CKD)   Diastolic HF / RV failure Afib  HTN HLD -at home is on 80mg  lasix  + Kcl at home, bisopropolol 10mg , farxiga  10  P -home meds on hold given present mentation  -she is on a dilt gtt which we will stop in ICU  -PO eliquis  will be on hold, will need hep gtt but would like to get CT H ideally if she isnt perking up  AKI on CKD 3b P -follow renal indices, UOP -minimize nephrotoxins, renally dose as needed  -CMP now   Elevated LFTs -repeating CMP now   Labs   CBC: Recent Labs  Lab 07/13/24 1714 08/05/24 0244  WBC 14.1* 10.3  HGB 13.6 12.7  HCT 42.6 41.5  MCV 96.4 99.0  PLT 221 188    Basic Metabolic Panel: Recent Labs  Lab 07/13/24 1714 2024/08/05 0244  NA 137 137  K 5.2* 4.3  CL 94* 94*  CO2 25 27  GLUCOSE 111* 198*  BUN 64* 70*  CREATININE 2.12* 2.14*  CALCIUM  8.7* 8.4*  MG 2.3  --    GFR: Estimated Creatinine Clearance: 15.1 mL/min (A) (by C-G formula based on SCr of 2.14 mg/dL (H)). Recent Labs  Lab 07/13/24 1714 2024-08-05 0244  WBC 14.1* 10.3    Liver Function Tests: Recent  Labs  Lab 07/13/24 1714 05-Aug-2024 0244  AST 49* 69*  ALT 28 49*  ALKPHOS 51 56  BILITOT 1.5* 0.8  PROT 7.2 6.4*  ALBUMIN  2.8* 2.4*   No results for input(s): LIPASE, AMYLASE in the last 168 hours. No results for input(s): AMMONIA in the last 168 hours.  ABG    Component Value Date/Time   HCO3 30.7 (H) 04/02/2016 1659   TCO2 32 06/22/2017 1636   O2SAT 55.0 04/02/2016 1659     Coagulation Profile: No results for input(s): INR, PROTIME in the last 168 hours.  Cardiac Enzymes: No results for input(s): CKTOTAL, CKMB, CKMBINDEX, TROPONINI in the last 168 hours.  HbA1C: Hgb A1c MFr Bld  Date/Time Value Ref Range Status  04/20/2024 05:06 PM 6.1 (H) 4.8 - 5.6 % Final    Comment:             Prediabetes: 5.7 - 6.4          Diabetes: >6.4          Glycemic control for adults with diabetes: <7.0   08/04/2023 04:32 PM 6.2 (H) 4.8 - 5.6 % Final    Comment:             Prediabetes: 5.7 - 6.4          Diabetes: >6.4          Glycemic control for adults with diabetes: <7.0     CBG: No results for input(s): GLUCAP in the last 168 hours.  Review of Systems:   Unable to obtain due to encephalopathy   Past Medical History:  She,  has a past medical history of Arthritis, Asthma, CHF (congestive heart failure) (HCC), CKD (chronic kidney disease), COPD (chronic obstructive pulmonary disease) (HCC), Diabetes mellitus (HCC), Gout, Hyperlipidemia, Hypertension, Shortness of breath, Sleep apnea, and Stroke (HCC).   Surgical History:   Past Surgical History:  Procedure Laterality Date   CARDIAC CATHETERIZATION N/A 04/02/2016   Procedure: Right Heart Cath;  Surgeon: Ezra GORMAN Shuck, MD;  Location[facility] INVASIVE CV LAB;  Service: Cardiovascular;  Laterality: N/A;   CATARACT EXTRACTION W/PHACO Right 04/19/2013   Procedure: CATARACT EXTRACTION PHACO AND INTRAOCULAR LENS PLACEMENT (IOC);  Surgeon: Jestine Bunnell, MD;  Location: [facility] OR;  Service: Ophthalmology;  Laterality:  Right;   CATARACT EXTRACTION W/PHACO Left 05/03/2013   Procedure: CATARACT EXTRACTION PHACO AND INTRAOCULAR LENS PLACEMENT (IOC);  Surgeon: Jestine Bunnell, MD;  Location[facility] OR;  Service: Ophthalmology;  Laterality: Left;   EYE SURGERY     TUBAL LIGATION       Social History:   reports that she quit smoking  about 19 years ago. Her smoking use included cigarettes. She started smoking about 59 years ago. She has a 60 pack-year smoking history. She has quit using smokeless tobacco. She reports that she does not drink alcohol and does not use drugs.   Family History:  Her family history includes Heart disease in her brother, brother, mother, sister, and sister; Heart failure in her father and mother.   Allergies Allergies  Allergen Reactions   Aldomet [Methyldopa] Hives   Tyvaso  [Treprostinil ] Diarrhea     Home Medications  Prior to Admission medications   Medication Sig Start Date End Date Taking? Authorizing Provider  albuterol  (VENTOLIN  HFA) 108 (90 Base) MCG/ACT inhaler INHALE 1-2 PUFFS BY MOUTH EVERY 6 HOURS AS NEEDED FOR WHEEZE OR SHORTNESS OF BREATH 05/23/24  Yes Parrett, Tammy S, NP  amLODipine  (NORVASC ) 10 MG tablet Take 1 tablet (10 mg total) by mouth daily. TAKE 1 TABLET BY MOUTH EVERYDAY AT BEDTIME 06/05/24  Yes Rolan Ezra RAMAN, MD  apixaban  (ELIQUIS ) 2.5 MG TABS tablet Take 1 tablet (2.5 mg total) by mouth 2 (two) times daily. 06/05/24  Yes Rolan Ezra RAMAN, MD  atorvastatin  (LIPITOR) 80 MG tablet Take 1 tablet (80 mg total) by mouth daily. 06/05/24  Yes Rolan Ezra RAMAN, MD  bisoprolol  (ZEBETA ) 10 MG tablet TAKE 1 TABLET BY MOUTH EVERY DAY 05/17/24  Yes Rolan Ezra RAMAN, MD  dapagliflozin  propanediol (FARXIGA ) 10 MG TABS tablet Take 1 tablet (10 mg total) by mouth daily. 06/05/24  Yes Rolan Ezra RAMAN, MD  ezetimibe  (ZETIA ) 10 MG tablet TAKE 1 TABLET BY MOUTH EVERY DAY 06/27/24  Yes McLean, Dalton S, MD  Fluticasone -Umeclidin-Vilant (TRELEGY ELLIPTA ) 100-62.5-25 MCG/ACT AEPB Inhale 1  puff into the lungs daily. 07/30/23  Yes Parrett, Tammy S, NP  furosemide  (LASIX ) 40 MG tablet TAKE 2 TABLETS BY MOUTH EVERY MORNING. CAN TAKE EXTRA DOSE EVERY OTHER AFTERNOON WITH WEIGHT GAIN. 11/30/23  Yes Rolan Ezra RAMAN, MD  OXYGEN Inhale 2 L into the lungs continuous.   Yes [provider]  potassium chloride  SA (KLOR-CON  M20) 20 MEQ tablet Take 1 tablet (20 mEq total) by mouth 3 (three) times daily. 05/16/24  Yes Terra Fairy PARAS, PA-C  sildenafil  (REVATIO ) 20 MG tablet TAKE 4 TABLETS (80 MG TOTAL) BY MOUTH 3 (THREE) TIMES DAILY. 05/15/24  Yes Rolan Ezra RAMAN, MD  macitentan  (OPSUMIT ) 10 MG tablet TAKE 1 TABLET (10MG ) BY MOUTH DAILY. Patient not taking: Reported on 07/13/2024 05/07/23   Bensimhon, Toribio SAUNDERS, MD     Critical care time: 50 min       CRITICAL CARE Performed by: Ronnald FORBES Gave   Total critical care time: 50 minutes  Critical care time was exclusive of separately billable procedures and treating other patients. Critical care was necessary to treat or prevent imminent or life-threatening deterioration.  Critical care was time spent personally by me on the following activities: development of treatment plan with patient and/or surrogate as well as nursing, discussions with consultants, evaluation of patient's response to treatment, examination of patient, obtaining history from patient or surrogate, ordering and performing treatments and interventions, ordering and review of laboratory studies, ordering and review of radiographic studies, pulse oximetry and re-evaluation of patient's condition.  Ronnald Gave MSN, AGACNP-BC [place] Pulmonary/Critical Care Medicine Amion for pager  24-Jul-2024, 1:23 PM

## 2024-07-17 NOTE — Progress Notes (Signed)
 Pharmacy Antibiotic Note  Beth [name] is a 83 y.o. female admitted on 07/13/2024 with resp failure. Pharmacy has been consulted for vancomycin  dosing. Cr up acutely from BL.  Plan: Vancomycin  1000mg  IV x1 then 500mg  IV q48h - est AUC 414 Watch Cr, consider vancomycin  random prior to redosing depending on trend  Height: 5' 2 (157.5 cm) Weight: 47.9 kg (105 lb 9.6 oz) IBW/kg (Calculated) : 50.1  Temp (24hrs), Avg:98.1 F (36.7 C), Min:96.9 F (36.1 C), Max:99 F (37.2 C)  Recent Labs  Lab 07/13/24 1714 August 02, 2024 0244  WBC 14.1* 10.3  CREATININE 2.12* 2.14*    Estimated Creatinine Clearance: 15.1 mL/min (A) (by C-G formula based on SCr of 2.14 mg/dL (H)).    Allergies  Allergen Reactions   Aldomet [Methyldopa] Hives   Tyvaso  [Treprostinil ] Diarrhea     Ozell Jamaica, PharmD, BCPS, [facility] Clinical Pharmacist [phone] Please check AMION for all [facility] Pharmacy numbers 08-02-2024

## 2024-07-17 NOTE — Plan of Care (Signed)
  Problem: Clinical Measurements: Goal: Ability to maintain clinical measurements within normal limits will improve Outcome: Progressing   Problem: Clinical Measurements: Goal: Will remain free from infection Outcome: Progressing   Problem: Clinical Measurements: Goal: Diagnostic test results will improve Outcome: Progressing   Problem: Clinical Measurements: Goal: Respiratory complications will improve Outcome: Progressing   Problem: Clinical Measurements: Goal: Cardiovascular complication will be avoided Outcome: Progressing   Problem: Activity: Goal: Risk for activity intolerance will decrease Outcome: Progressing   Problem: Nutrition: Goal: Adequate nutrition will be maintained Outcome: Progressing

## 2024-07-17 NOTE — Progress Notes (Signed)
 [facility]  PROGRESS NOTE  Beth [name] FMW:[id number] DOB: 1941/03/08 DOA: 07/13/2024 PCP: Petrina Pries, NP   Brief HPI:   83 y.o. female with medical history significant for chronic HFpEF, pulmonary hypertension, PAF on Eliquis , RBBB, COPD, chronic respiratory failure with hypoxia on 2 L [place], CKD stage IIIb, T2DM, HTN, HLD, OSA intolerant to CPAP who presented to the ED for evaluation of shortness of breath.  Patient states that she has chronic shortness of breath at baseline which has been an ongoing issue for 3 years. Over the last 1-1/2 weeks she has been having worsening dyspnea compared to her baseline.  In the ED patient was given IV Lasix  with not much diuretic response, then started on IV fluids.  Patient's O2 sats continue to drop, she was placed on BiPAP this morning.    Assessment/Plan:   Acute on chronic hypoxemic respiratory failure - Unclear etiology, does not have wheezing, not much urine output after additional dose of Lasix  given this morning - She has a history of pulmonary hypertension, COPD, chronically on 2 L of oxygen at home - Continues on BiPAP - Will consult pulmonology for further recommendations and management  Paroxysmal atrial fibrillation with RVR - Presented with paroxysmal atrial fibrillation with RVR - Started on IV diltiazem  gtt. - Continue Eliquis  - Bisoprolol  10 mg daily resumed from this morning  Acute kidney injury on CKD stage IIIb - Creatinine on admission was 2.12, baseline creatinine 1.2 - She received both Lasix  and IV fluids in the ED He received Lasix  40 mg IV this morning- -will follow-up BMP in a.m.  COPD -No significant wheezing noted on exam - Continue Xopenex , ipratropium as needed   Pulmonary hypertension: Recent TTE 06/30/2024 showed improvement in RV dysfunction. - Continue sildenafil  - Patient is not taking Opsumit  per med rec   Hypertension: Resumed bisoprolol .  Holding amlodipine  while on IV diltiazem  drip.    Type 2 diabetes: Well-controlled with last hemoglobin A1c 6.1%.  Holding Farxiga  as above.   Hyperlipidemia: Continue atorvastatin  and Zetia .   OSA: Not using CPAP due to intolerance.  Continue supplemental oxygen 24/7.  Medications     apixaban   2.5 mg Oral BID   atorvastatin   80 mg Oral Daily   bisoprolol   10 mg Oral Daily   budesonide -glycopyrrolate -formoterol   2 puff Inhalation BID   ezetimibe   10 mg Oral Daily   sildenafil   80 mg Oral TID   sodium chloride  flush  3 mL Intravenous Q12H     Data Reviewed:   CBG:  No results for input(s): GLUCAP in the last 168 hours.  SpO2: 95 % O2 Flow Rate (L/min): (S) 50 L/min FiO2 (%): 70 %    Vitals:   07-18-2024 0620 07/18/2024 0640 Jul 18, 2024 0641 2024-07-18 0801  BP:    127/67  Pulse:  97 97 84  Resp: (!) 35 (!) 26 (!) 35 20  Temp:    (!) 96.9 F (36.1 C)  TempSrc:    Axillary  SpO2:    95%  Weight:      Height:          Data Reviewed:  Basic Metabolic Panel: Recent Labs  Lab 07/13/24 1714 2024-07-18 0244  NA 137 137  K 5.2* 4.3  CL 94* 94*  CO2 25 27  GLUCOSE 111* 198*  BUN 64* 70*  CREATININE 2.12* 2.14*  CALCIUM  8.7* 8.4*  MG 2.3  --     CBC: Recent Labs  Lab 07/13/24 1714 07-18-2024 0244  WBC  14.1* 10.3  HGB 13.6 12.7  HCT 42.6 41.5  MCV 96.4 99.0  PLT 221 188    LFT Recent Labs  Lab 07/13/24 1714 07-18-2024 0244  AST 49* 69*  ALT 28 49*  ALKPHOS 51 56  BILITOT 1.5* 0.8  PROT 7.2 6.4*  ALBUMIN  2.8* 2.4*     Antibiotics: Anti-infectives (From admission, onward)    None        DVT prophylaxis: Eliquis   Code Status: Full code  Family Communication: Discussed with patient's daughter at bedside   CONSULTS    Subjective   Has been lethargic.  Briefly opens eyes to verbal stimuli.   Objective    Physical Examination:   General: Lethargic, briefly opens eyes to verbal stimuli Cardiovascular: S1-S2, regular Respiratory: Faint crackles auscultated at lung  bases Abdomen: Soft, nontender, no organomegaly Extremities: No edema in the lower extremities Neurologic: Lethargic, opens eyes to verbal stimuli   Status is: Inpatient:             Sabas GORMAN Brod   [facility] If 7PM-7AM, please contact night-coverage at [email or web address], Office  [phone]   07-18-24, 8:04 AM  LOS: 1 day

## 2024-07-17 NOTE — Progress Notes (Signed)
   2024-07-20 1900  Spiritual Encounters  Type of Visit Initial  Care provided to: Family  Referral source Nurse (RN/NT/LPN)  Reason for visit Grief/loss  OnCall Visit Yes   Chaplain was paged for grief/loss support. Chaplain was present but the family stated that they don't need a chaplain right now. Chaplain remains available if needed.   M.Kubra Beth [name] Resident [phone]

## 2024-07-17 NOTE — Progress Notes (Signed)
   08-03-24 1645  Spiritual Encounters  Type of Visit Initial  Care provided to: Pt and family  Referral source Clinical staff  Reason for visit Urgent spiritual support  OnCall Visit No  Spiritual Framework  Presenting Themes Impactful experiences and emotions;Significant life change;Coping tools  Community/Connection Family  Patient Stress Factors Major life changes;Health changes  Family Stress Factors Health changes;Major life changes;Exhausted  Interventions  Spiritual Care Interventions Made Compassionate presence;Established relationship of care and support;Prayer  Intervention Outcomes  Outcomes Awareness of health;Awareness of support;Connection to values and goals of care   Chaplain met with the Pt's family who are experiencing a life transitioning regard Pt.(Mom)  The son and daughter were present at bedside. Chaplain provide a Bible at the family's request. Offered spiritual and emotional support, and prayed with family according to their expressed concerns. Medical staff was also present during the visit to discus possible next steps in the Pt's care

## 2024-07-17 NOTE — Death Summary Note (Signed)
 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 year old female who presented to ED 07/22/2024 for progressive SOB, feeling unwell, and not acting like herself. She was found to be in Afib, intermittent RVR, had an AKI superimposed on her baseline disease, had elevated BNP and progressive hypoxia. She was increasingly altered 07-23-2024 and had rising oxygen requirement, ultimately requiring BiPAP. She did not respond to IV lasix . PCCM was consulted, and transferred pt to ICU in this setting. Interventions were broadened to include steroids, triple therapy and antibiotics. Additional workup was obtained, and labs were significant for respiratory acidosis. Advance heart failure was also consulted. Despite a high minute ventilation, her gas exchange continued to worsen-- as her acidosis worsened her blood pressure began to drop.  Discussions between Family + PCCM + Advance heart failure occurred regarding goals of care and code status. The patient's code status was updated to DNR. Very soon after, the patient went into  asystole and was pronounced dead at 16:47, with family at the bedside     Pertinent Labs and Studies  Significant Diagnostic Studies US  EKG SITE RITE Result Date: 2024-07-23 If Site Rite image not attached, placement could not be confirmed due to current cardiac rhythm.  DG CHEST PORT 1 VIEW Result Date: Jul 23, 2024 EXAM: 1 VIEW XRAY OF THE CHEST 2024-07-23 07:05:30 AM COMPARISON: Jul 22, 2024 CLINICAL HISTORY: SOB (shortness of breath) FINDINGS: LUNGS AND PLEURA: Mild pulmonary edema. Trace bilateral pleural effusions. Retrocardiac opacification in the left base may reflect atelectasis or airspace disease. HEART AND MEDIASTINUM: Mild cardiomegaly Aortic atherosclerosis. BONES AND SOFT TISSUES: No acute osseous abnormality. IMPRESSION: 1. Mild pulmonary edema and trace bilateral pleural effusions. 2. Retrocardiac opacification in the left base, possibly atelectasis or airspace disease. 3. Mild cardiomegaly. Electronically signed by: Waddell Calk MD 07/23/24 07:17 AM EDT RP Workstation: [id number]   DG Chest 2 View Result Date: 2024-07-22 CLINICAL DATA:  Shortness of breath EXAM: CHEST - 2 VIEW COMPARISON:  05/13/2024 FINDINGS: No consolidation. Cardiomegaly with prominent central pulmonary vessels. Emphysema. Mild atelectasis at the left base. No pleural effusion or pneumothorax. Aortic atherosclerosis. IMPRESSION: Cardiomegaly with prominent central pulmonary vessels. Emphysema. No acute airspace disease Electronically Signed   By: Luke Bun M.D.   On: 2024-07-22 19:30   ECHOCARDIOGRAM COMPLETE Result Date: 06/30/2024    ECHOCARDIOGRAM REPORT   Patient Name:   Beth [name] Date of Exam: 06/30/2024 Medical Rec #:  [id number]      Height:       62.0 in Accession #:    [id number]     Weight:       104.0 lb Date of Birth:  10-30-1941      BSA:          1.448 m Patient Age:    83 years       BP:           180/82 mmHg Patient Gender: F              HR:           66 bpm. Exam Location:  Outpatient  Procedure: 2D Echo, Color Doppler and Cardiac Doppler (Both Spectral and Color            Flow Doppler were utilized during procedure). Indications:    I27.2 Pulmonary Hypertension  History:        Patient has prior history of Echocardiogram examinations. CHF,                 COPD, CKD and Stroke; Risk Factors:Hypertension and                 Dyslipidemia.  Sonographer:    L. Thornton-Maynard, RDCS Referring Phys: PAT OHENHEN IMPRESSIONS  1. Left ventricular ejection fraction, by estimation, is 70 to 75%. The left ventricle has hyperdynamic function. The left ventricle has no regional wall motion abnormalities. There is mild concentric left ventricular hypertrophy. Left ventricular diastolic parameters are consistent with Grade I diastolic dysfunction (impaired relaxation).  2. Right ventricular systolic function is mildly reduced. The right ventricular size is normal. There is normal pulmonary artery systolic pressure. The estimated right ventricular systolic pressure is 33.5 mmHg.  3. Left atrial size was moderately dilated.  4. The mitral valve is normal in structure. Mild mitral valve regurgitation. No evidence of mitral stenosis.  5. The aortic valve is tricuspid. There is severe calcifcation of the aortic valve. Aortic valve regurgitation is not visualized. Mild aortic valve stenosis. Aortic valve area, by VTI measures 1.57 cm. Aortic valve mean gradient measures 15.0 mmHg.  6. The inferior vena cava is normal in size with greater than 50% respiratory variability, suggesting right atrial pressure of 3 mmHg. FINDINGS  Left Ventricle: Left ventricular ejection fraction, by estimation, is 70 to 75%. The left ventricle has hyperdynamic function. The left ventricle has no regional wall motion abnormalities. The left ventricular internal cavity size was normal in size. There is mild concentric left ventricular hypertrophy. Left ventricular diastolic parameters are consistent with Grade I diastolic dysfunction  (impaired relaxation). Right Ventricle: The right ventricular size is normal. No increase in right ventricular wall thickness. Right ventricular systolic function is mildly reduced. There is normal pulmonary artery systolic pressure. The tricuspid regurgitant velocity is 2.76 m/s, and with an assumed right atrial pressure of 3 mmHg, the estimated right ventricular systolic pressure is 33.5 mmHg. Left Atrium: Left atrial size was moderately dilated. Right Atrium: Right atrial size was normal in size. Pericardium: There is no evidence of pericardial effusion. Mitral Valve: The mitral valve is normal in structure. Mild mitral annular calcification. Mild mitral valve regurgitation. No evidence of mitral valve stenosis. Tricuspid Valve: The tricuspid valve is normal in structure. Tricuspid valve regurgitation is trivial. Aortic Valve: The aortic valve is tricuspid. There is severe calcifcation of the aortic valve. Aortic valve regurgitation is not visualized. Mild aortic stenosis is present. Aortic valve mean gradient measures 15.0 mmHg. Aortic valve peak gradient measures 29.6 mmHg. Aortic valve area, by VTI measures 1.57 cm. Pulmonic Valve: The pulmonic valve was normal in structure. Pulmonic valve regurgitation is not visualized. Aorta: The aortic root is normal in size and  structure. Venous: The inferior vena cava is normal in size with greater than 50% respiratory variability, suggesting right atrial pressure of 3 mmHg. IAS/Shunts: No atrial level shunt detected by color flow Doppler.  LEFT VENTRICLE PLAX 2D LVIDd:         3.80 cm     Diastology LVIDs:         2.00 cm     LV e' medial:    3.92 cm/s LV PW:         1.10 cm     LV E/e' medial:  19.1 LV IVS:        1.20 cm     LV e' lateral:   8.38 cm/s LVOT diam:     1.90 cm     LV E/e' lateral: 8.9 LV SV:         84 LV SV Index:   58 LVOT Area:     2.84 cm  LV Volumes (MOD) LV vol d, MOD A2C: 80.2 ml LV vol d, MOD A4C: 55.0 ml LV vol s, MOD A2C: 16.3 ml LV vol s,  MOD A4C: 13.6 ml LV SV MOD A2C:     63.9 ml LV SV MOD A4C:     55.0 ml LV SV MOD BP:      51.7 ml RIGHT VENTRICLE RV Basal diam:  3.90 cm RV Mid diam:    1.90 cm RV S prime:     9.79 cm/s TAPSE (M-mode): 2.0 cm LEFT ATRIUM              Index        RIGHT ATRIUM           Index LA diam:        3.90 cm  2.69 cm/m   RA Area:     14.10 cm LA Vol (A2C):   112.0 ml 77.36 ml/m  RA Volume:   33.10 ml  22.86 ml/m LA Vol (A4C):   101.0 ml 69.76 ml/m LA Biplane Vol: 106.0 ml 73.21 ml/m  AORTIC VALVE                     PULMONIC VALVE AV Area (Vmax):    1.51 cm      PV Vmax:       1.26 m/s AV Area (Vmean):   1.58 cm      PV Peak grad:  6.4 mmHg AV Area (VTI):     1.57 cm AV Vmax:           272.00 cm/s AV Vmean:          166.000 cm/s AV VTI:            0.535 m AV Peak Grad:      29.6 mmHg AV Mean Grad:      15.0 mmHg LVOT Vmax:         145.00 cm/s LVOT Vmean:        92.567 cm/s LVOT VTI:          0.297 m LVOT/AV VTI ratio: 0.56  AORTA Ao Root diam: 3.10 cm Ao Asc diam:  2.80 cm MITRAL VALVE               TRICUSPID VALVE MV Area (PHT): 2.44 cm    TR Peak grad:   30.5 mmHg MV Decel Time: 311 msec    TR Vmax:        276.00 cm/s MV E velocity: 74.90 cm/s MV A velocity: 76.00  cm/s  SHUNTS MV E/A ratio:  0.99        Systemic VTI:  0.30 m                            Systemic Diam: 1.90 cm Dalton McleanMD Electronically signed by Ezra Kanner Signature Date/Time: 06/30/2024/4:20:51 PM    Final     Microbiology Recent Results (from the past 240 hours)  Resp panel by RT-PCR (RSV, Flu A&B, Covid) Anterior Nasal Swab     Status: None   Collection Time: 07/13/24  5:56 PM   Specimen: Anterior Nasal Swab  Result Value Ref Range Status   SARS Coronavirus 2 by RT PCR NEGATIVE NEGATIVE Final   Influenza A by PCR NEGATIVE NEGATIVE Final   Influenza B by PCR NEGATIVE NEGATIVE Final    Comment: (NOTE) The Xpert Xpress SARS-CoV-2/FLU/RSV plus assay is intended as an aid in the diagnosis of influenza from Nasopharyngeal swab  specimens and should not be used as a sole basis for treatment. Nasal washings and aspirates are unacceptable for Xpert Xpress SARS-CoV-2/FLU/RSV testing.  Fact Sheet for Patients: BloggerCourse.com  Fact Sheet for Healthcare Providers: SeriousBroker.it  This test is not yet approved or cleared by the [place]  FDA and has been authorized for detection and/or diagnosis of SARS-CoV-2 by FDA under an Emergency Use Authorization (EUA). This EUA [name] remain in effect (meaning this test can be used) for the duration of the COVID-19 declaration under Section 564(b)(1) of the Act, 21 U.S.C. section 360bbb-3(b)(1), unless the authorization is terminated or revoked.     Resp Syncytial Virus by PCR NEGATIVE NEGATIVE Final    Comment: (NOTE) Fact Sheet for Patients: BloggerCourse.com  Fact Sheet for Healthcare Providers: SeriousBroker.it  This test is not yet approved or cleared by the [place]  FDA and has been authorized for detection and/or diagnosis of SARS-CoV-2 by FDA under an Emergency Use Authorization (EUA). This EUA [name] remain in effect (meaning this test can be used) for the duration of the COVID-19 declaration under Section 564(b)(1) of the Act, 21 U.S.C. section 360bbb-3(b)(1), unless the authorization is terminated or revoked.  Performed at [facility], [address], [place], [place] [address]   Respiratory (~20 pathogens) panel by PCR     Status: None   Collection Time: 07/17/24 12:31 PM   Specimen: Nasopharyngeal Swab; Respiratory  Result Value Ref Range Status   Adenovirus NOT DETECTED NOT DETECTED Final   Coronavirus 229E NOT DETECTED NOT DETECTED Final    Comment: (NOTE) The Coronavirus on the Respiratory Panel, DOES NOT test for the novel  Coronavirus (2019 nCoV)    Coronavirus HKU1 NOT DETECTED NOT DETECTED Final   Coronavirus NL63 NOT  DETECTED NOT DETECTED Final   Coronavirus OC43 NOT DETECTED NOT DETECTED Final   Metapneumovirus NOT DETECTED NOT DETECTED Final   Rhinovirus / Enterovirus NOT DETECTED NOT DETECTED Final   Influenza A NOT DETECTED NOT DETECTED Final   Influenza B NOT DETECTED NOT DETECTED Final   Parainfluenza Virus 1 NOT DETECTED NOT DETECTED Final   Parainfluenza Virus 2 NOT DETECTED NOT DETECTED Final   Parainfluenza Virus 3 NOT DETECTED NOT DETECTED Final   Parainfluenza Virus 4 NOT DETECTED NOT DETECTED Final   Respiratory Syncytial Virus NOT DETECTED NOT DETECTED Final   Bordetella pertussis NOT DETECTED NOT DETECTED Final   Bordetella Parapertussis NOT DETECTED NOT DETECTED Final   Chlamydophila pneumoniae NOT DETECTED NOT DETECTED Final  Mycoplasma pneumoniae NOT DETECTED NOT DETECTED Final    Comment: Performed at [facility], [address], [place], [place] [address]    Lab Basic Metabolic Panel: Recent Labs  Lab 07/13/24 1714 07/24/2024 0244 July 24, 2024 1411 July 24, 2024 1430 2024-07-24 1554  NA 137 137 138 140 139  K 5.2* 4.3 4.3 4.2 4.4  CL 94* 94*  --  94*  --   CO2 25 27  --  26  --   GLUCOSE 111* 198*  --  89  --   BUN 64* 70*  --  76*  --   CREATININE 2.12* 2.14*  --  2.24*  --   CALCIUM  8.7* 8.4*  --  8.5*  --   MG 2.3  --   --   --   --    Liver Function Tests: Recent Labs  Lab 07/13/24 1714 07/24/2024 0244 24-Jul-2024 1430  AST 49* 69* 123*  ALT 28 49* 89*  ALKPHOS 51 56 55  BILITOT 1.5* 0.8 0.6  PROT 7.2 6.4* 6.5  ALBUMIN  2.8* 2.4* 2.3*   No results for input(s): LIPASE, AMYLASE in the last 168 hours. Recent Labs  Lab 07-24-24 1315  AMMONIA 45*   CBC: Recent Labs  Lab 07/13/24 1714 July 24, 2024 0244 2024-07-24 1411 07-24-2024 1554  WBC 14.1* 10.3  --   --   HGB 13.6 12.7 13.3 14.3  HCT 42.6 41.5 39.0 42.0  MCV 96.4 99.0  --   --   PLT 221 188  --   --    Cardiac Enzymes: No results for input(s): CKTOTAL, CKMB, CKMBINDEX, TROPONINI in the last  168 hours. Sepsis Labs: Recent Labs  Lab 07/13/24 1714 07/24/24 0244 07/24/24 1315 24-Jul-2024 1430  PROCALCITON  --   --  29.34  --   WBC 14.1* 10.3  --   --   LATICACIDVEN  --   --   --  2.6*    Procedures/Operations    Ronnald FORBES Gave Jul 24, 2024, 5:08 PM

## 2024-07-17 NOTE — Progress Notes (Signed)
 Still lethargic but moving more. Protecting airway. Overall still pretty puny seeming.  ABG reviewed and  BIPAP changes made. Will give her a little time w adjusted settings and see how she does.  Low threshold for ETT but do worry she may be difficult to separate from MV   Have asked HF to see as well.   Ronnald Gave MSN, AGACNP-BC [facility] Jul 16, 2024, 2:21 PM

## 2024-07-17 NOTE — Progress Notes (Signed)
 Monitoring by Pharmacy for Pulmonary Hypertension Treatment  Indication - Initiation of PAH medication while hospitalized  Patient is 83 y.o. and will be restarted on macitentan  (OPSUMIT ). Noted pt on this medication in the past but seems to have stopped taking at some point - last fill date 01/20/24.  Pregnancy risk has been assessed on admission and Patient is female and cannot get pregnant as she is 83 y.o.  Hepatic function has been evaluated. AST / ALT appropriate to continue medication at this time.     Latest Ref Rng & Units 07/31/24    2:44 AM 07/13/2024    5:14 PM 05/13/2024    5:09 PM  Hepatic Function  Total Protein 6.5 - 8.1 g/dL 6.4  7.2  7.3   Albumin  3.5 - 5.0 g/dL 2.4  2.8  3.7   AST 15 - 41 U/L 69  49  23   ALT 0 - 44 U/L 49  28  15   Alk Phosphatase 38 - 126 U/L 56  51  38   Total Bilirubin 0.0 - 1.2 mg/dL 0.8  1.5  0.6   Bilirubin, Direct 0.0 - 0.2 mg/dL  0.4      Thank for you allowing us  to participate in the care of this patient.  Vito KANDICE Ralph, PharmD July 31, 2024, 3:33 PM

## 2024-07-17 DEATH — deceased

## 2024-07-19 ENCOUNTER — Ambulatory Visit: Admitting: Pharmacist

## 2024-07-19 ENCOUNTER — Other Ambulatory Visit: Admitting: Licensed Clinical Social Worker

## 2024-07-19 ENCOUNTER — Telehealth

## 2024-07-19 LAB — CULTURE, BLOOD (ROUTINE X 2)
Culture: NO GROWTH
Culture: NO GROWTH

## 2024-07-26 ENCOUNTER — Ambulatory Visit: Admitting: Family Medicine

## 2024-07-26 ENCOUNTER — Ambulatory Visit

## 2024-08-07 ENCOUNTER — Other Ambulatory Visit (HOSPITAL_COMMUNITY)

## 2024-08-17 ENCOUNTER — Other Ambulatory Visit (HOSPITAL_COMMUNITY): Payer: Self-pay | Admitting: Cardiology

## 2024-08-24 ENCOUNTER — Ambulatory Visit: Admitting: Neurology

## 2024-08-25 ENCOUNTER — Other Ambulatory Visit (HOSPITAL_COMMUNITY): Payer: Self-pay | Admitting: Cardiology

## 2024-08-25 DIAGNOSIS — I509 Heart failure, unspecified: Secondary | ICD-10-CM

## 2024-09-05 ENCOUNTER — Encounter (HOSPITAL_COMMUNITY)

## 2024-09-20 ENCOUNTER — Ambulatory Visit: Payer: Medicare HMO
